# Patient Record
Sex: Female | Born: 1945 | Race: White | Hispanic: No | Smoking: Former smoker
Health system: Southern US, Community
[De-identification: ages and names within clinical notes are randomized; demographics above are authoritative.]

## PROBLEM LIST (undated history)

## (undated) ENCOUNTER — Ambulatory Visit: Admission: EM | Source: Home / Self Care

## (undated) DIAGNOSIS — M1990 Unspecified osteoarthritis, unspecified site: Secondary | ICD-10-CM

## (undated) DIAGNOSIS — L0390 Cellulitis, unspecified: Secondary | ICD-10-CM

## (undated) DIAGNOSIS — R7301 Impaired fasting glucose: Secondary | ICD-10-CM

## (undated) DIAGNOSIS — D131 Benign neoplasm of stomach: Secondary | ICD-10-CM

## (undated) DIAGNOSIS — K5792 Diverticulitis of intestine, part unspecified, without perforation or abscess without bleeding: Secondary | ICD-10-CM

## (undated) DIAGNOSIS — J189 Pneumonia, unspecified organism: Secondary | ICD-10-CM

## (undated) DIAGNOSIS — K5790 Diverticulosis of intestine, part unspecified, without perforation or abscess without bleeding: Secondary | ICD-10-CM

## (undated) DIAGNOSIS — K589 Irritable bowel syndrome without diarrhea: Secondary | ICD-10-CM

## (undated) DIAGNOSIS — H269 Unspecified cataract: Secondary | ICD-10-CM

## (undated) DIAGNOSIS — L02214 Cutaneous abscess of groin: Secondary | ICD-10-CM

## (undated) DIAGNOSIS — B029 Zoster without complications: Secondary | ICD-10-CM

## (undated) DIAGNOSIS — K219 Gastro-esophageal reflux disease without esophagitis: Secondary | ICD-10-CM

## (undated) DIAGNOSIS — T7840XA Allergy, unspecified, initial encounter: Secondary | ICD-10-CM

## (undated) DIAGNOSIS — M8580 Other specified disorders of bone density and structure, unspecified site: Secondary | ICD-10-CM

## (undated) DIAGNOSIS — I10 Essential (primary) hypertension: Secondary | ICD-10-CM

## (undated) DIAGNOSIS — M797 Fibromyalgia: Secondary | ICD-10-CM

## (undated) HISTORY — DX: Irritable bowel syndrome, unspecified: K58.9

## (undated) HISTORY — DX: Unspecified cataract: H26.9

## (undated) HISTORY — DX: Essential (primary) hypertension: I10

## (undated) HISTORY — PX: COLONOSCOPY W/ POLYPECTOMY: SHX1380

## (undated) HISTORY — DX: Gastro-esophageal reflux disease without esophagitis: K21.9

## (undated) HISTORY — PX: SPINE SURGERY: SHX786

## (undated) HISTORY — DX: Benign neoplasm of stomach: D13.1

## (undated) HISTORY — DX: Pneumonia, unspecified organism: J18.9

## (undated) HISTORY — DX: Diverticulosis of intestine, part unspecified, without perforation or abscess without bleeding: K57.90

## (undated) HISTORY — DX: Impaired fasting glucose: R73.01

## (undated) HISTORY — DX: Unspecified osteoarthritis, unspecified site: M19.90

## (undated) HISTORY — DX: Zoster without complications: B02.9

## (undated) HISTORY — PX: ESOPHAGOGASTRODUODENOSCOPY: SHX1529

## (undated) HISTORY — DX: Diverticulitis of intestine, part unspecified, without perforation or abscess without bleeding: K57.92

## (undated) HISTORY — DX: Fibromyalgia: M79.7

## (undated) HISTORY — DX: Allergy, unspecified, initial encounter: T78.40XA

## (undated) HISTORY — DX: Other specified disorders of bone density and structure, unspecified site: M85.80

## (undated) HISTORY — PX: DILATION AND CURETTAGE OF UTERUS: SHX78

---
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
## 2004-04-17 ENCOUNTER — Observation Stay (HOSPITAL_COMMUNITY): Admission: EM | Admit: 2004-04-17 | Discharge: 2004-04-18 | Payer: Self-pay | Admitting: Internal Medicine

## 2004-08-16 ENCOUNTER — Ambulatory Visit: Payer: Self-pay | Admitting: Internal Medicine

## 2004-08-18 ENCOUNTER — Ambulatory Visit (HOSPITAL_COMMUNITY): Admission: RE | Admit: 2004-08-18 | Discharge: 2004-08-18 | Payer: Self-pay | Admitting: Internal Medicine

## 2004-09-12 ENCOUNTER — Ambulatory Visit: Payer: Self-pay | Admitting: Internal Medicine

## 2004-11-09 ENCOUNTER — Ambulatory Visit: Payer: Self-pay | Admitting: Family Medicine

## 2005-03-20 ENCOUNTER — Ambulatory Visit: Payer: Self-pay | Admitting: Internal Medicine

## 2005-05-21 ENCOUNTER — Ambulatory Visit: Payer: Self-pay | Admitting: Family Medicine

## 2005-06-04 ENCOUNTER — Ambulatory Visit: Payer: Self-pay | Admitting: Internal Medicine

## 2005-06-06 ENCOUNTER — Ambulatory Visit: Payer: Self-pay | Admitting: Internal Medicine

## 2005-06-15 ENCOUNTER — Ambulatory Visit: Payer: Self-pay | Admitting: Internal Medicine

## 2005-06-19 ENCOUNTER — Ambulatory Visit: Payer: Self-pay | Admitting: Cardiovascular Disease

## 2005-08-10 ENCOUNTER — Ambulatory Visit: Payer: Self-pay | Admitting: Internal Medicine

## 2005-12-19 ENCOUNTER — Ambulatory Visit: Payer: Self-pay | Admitting: Gastroenterology

## 2005-12-20 ENCOUNTER — Ambulatory Visit: Payer: Self-pay | Admitting: Family Medicine

## 2005-12-20 ENCOUNTER — Other Ambulatory Visit: Admission: RE | Admit: 2005-12-20 | Discharge: 2005-12-20 | Payer: Self-pay | Admitting: Family Medicine

## 2005-12-20 ENCOUNTER — Encounter: Payer: Self-pay | Admitting: Family Medicine

## 2005-12-20 ENCOUNTER — Ambulatory Visit: Payer: Self-pay | Admitting: Gastroenterology

## 2006-02-07 ENCOUNTER — Ambulatory Visit: Payer: Self-pay | Admitting: Gastroenterology

## 2006-02-07 LAB — HM COLONOSCOPY

## 2006-03-04 ENCOUNTER — Ambulatory Visit: Payer: Self-pay | Admitting: Internal Medicine

## 2006-03-07 ENCOUNTER — Ambulatory Visit: Payer: Self-pay | Admitting: Internal Medicine

## 2006-08-20 ENCOUNTER — Ambulatory Visit: Payer: Self-pay | Admitting: Internal Medicine

## 2006-11-01 ENCOUNTER — Ambulatory Visit: Payer: Self-pay | Admitting: Family Medicine

## 2006-11-01 LAB — CONVERTED CEMR LAB
Eosinophils Absolute: 0.1 10*3/uL (ref 0.0–0.7)
Eosinophils Relative: 2 % (ref 0–5)
HCT: 40.9 % (ref 36.0–46.0)
Hemoglobin: 13.2 g/dL (ref 12.0–15.0)
Lymphocytes Relative: 23 % (ref 12–46)
Lymphs Abs: 1.1 10*3/uL (ref 0.7–3.3)
MCHC: 32.3 g/dL (ref 30.0–36.0)
MCV: 90.1 fL (ref 78.0–100.0)
Neutro Abs: 3.3 10*3/uL (ref 1.7–7.7)
Neutrophils Relative %: 67 % (ref 43–77)
RBC: 4.54 M/uL (ref 3.87–5.11)
RDW: 14.1 % — ABNORMAL HIGH (ref 11.5–14.0)
TSH: 2.565 microintl units/mL (ref 0.350–5.50)
WBC: 4.9 10*3/uL (ref 4.0–10.5)

## 2007-01-25 ENCOUNTER — Ambulatory Visit: Payer: Self-pay | Admitting: Family Medicine

## 2007-02-06 DIAGNOSIS — M810 Age-related osteoporosis without current pathological fracture: Secondary | ICD-10-CM | POA: Insufficient documentation

## 2007-02-06 DIAGNOSIS — H401290 Low-tension glaucoma, unspecified eye, stage unspecified: Secondary | ICD-10-CM | POA: Insufficient documentation

## 2007-04-01 ENCOUNTER — Telehealth (INDEPENDENT_AMBULATORY_CARE_PROVIDER_SITE_OTHER): Payer: Self-pay | Admitting: *Deleted

## 2007-04-02 ENCOUNTER — Ambulatory Visit: Payer: Self-pay | Admitting: Internal Medicine

## 2007-04-02 DIAGNOSIS — R109 Unspecified abdominal pain: Secondary | ICD-10-CM | POA: Insufficient documentation

## 2007-04-02 DIAGNOSIS — R7989 Other specified abnormal findings of blood chemistry: Secondary | ICD-10-CM | POA: Insufficient documentation

## 2007-04-02 DIAGNOSIS — IMO0002 Reserved for concepts with insufficient information to code with codable children: Secondary | ICD-10-CM | POA: Insufficient documentation

## 2007-04-02 DIAGNOSIS — M79609 Pain in unspecified limb: Secondary | ICD-10-CM | POA: Insufficient documentation

## 2007-04-02 DIAGNOSIS — E559 Vitamin D deficiency, unspecified: Secondary | ICD-10-CM

## 2007-04-02 LAB — CONVERTED CEMR LAB
Ketones, urine, test strip: NEGATIVE
Nitrite: NEGATIVE
Protein, U semiquant: NEGATIVE

## 2007-04-03 ENCOUNTER — Encounter: Payer: Self-pay | Admitting: Family Medicine

## 2007-04-04 ENCOUNTER — Encounter (INDEPENDENT_AMBULATORY_CARE_PROVIDER_SITE_OTHER): Payer: Self-pay | Admitting: *Deleted

## 2007-04-04 LAB — CONVERTED CEMR LAB
Basophils Relative: 0.5 % (ref 0.0–1.0)
Eosinophils Absolute: 0.1 10*3/uL (ref 0.0–0.6)
Eosinophils Relative: 1.5 % (ref 0.0–5.0)
HCT: 38.8 % (ref 36.0–46.0)
Hemoglobin: 13.6 g/dL (ref 12.0–15.0)
Hgb A1c MFr Bld: 5.1 % (ref 4.6–6.0)
MCV: 88.6 fL (ref 78.0–100.0)
Monocytes Relative: 7.8 % (ref 3.0–11.0)
Neutro Abs: 2.7 10*3/uL (ref 1.4–7.7)
Platelets: 145 10*3/uL — ABNORMAL LOW (ref 150–400)
RDW: 12.8 % (ref 11.5–14.6)
Rhuematoid fact SerPl-aCnc: <20 intl units/mL — ABNORMAL LOW (ref 0.0–20.0)
Sed Rate: 17 mm/hr (ref 0–25)
WBC: 3.9 10*3/uL — ABNORMAL LOW (ref 4.5–10.5)

## 2007-04-08 ENCOUNTER — Telehealth (INDEPENDENT_AMBULATORY_CARE_PROVIDER_SITE_OTHER): Payer: Self-pay | Admitting: *Deleted

## 2007-05-21 ENCOUNTER — Ambulatory Visit: Payer: Self-pay | Admitting: Internal Medicine

## 2007-05-22 ENCOUNTER — Encounter (INDEPENDENT_AMBULATORY_CARE_PROVIDER_SITE_OTHER): Payer: Self-pay | Admitting: *Deleted

## 2007-07-16 ENCOUNTER — Other Ambulatory Visit: Admission: RE | Admit: 2007-07-16 | Discharge: 2007-07-16 | Payer: Self-pay | Admitting: Obstetrics & Gynecology

## 2007-07-18 ENCOUNTER — Encounter: Payer: Self-pay | Admitting: Internal Medicine

## 2007-12-09 ENCOUNTER — Telehealth (INDEPENDENT_AMBULATORY_CARE_PROVIDER_SITE_OTHER): Payer: Self-pay | Admitting: *Deleted

## 2007-12-17 ENCOUNTER — Ambulatory Visit: Payer: Self-pay | Admitting: Internal Medicine

## 2008-01-20 ENCOUNTER — Telehealth: Payer: Self-pay | Admitting: Internal Medicine

## 2008-01-21 ENCOUNTER — Telehealth (INDEPENDENT_AMBULATORY_CARE_PROVIDER_SITE_OTHER): Payer: Self-pay

## 2008-01-21 ENCOUNTER — Ambulatory Visit: Payer: Self-pay | Admitting: Cardiology

## 2008-01-21 ENCOUNTER — Ambulatory Visit: Payer: Self-pay | Admitting: Internal Medicine

## 2008-01-21 DIAGNOSIS — K589 Irritable bowel syndrome without diarrhea: Secondary | ICD-10-CM

## 2008-01-21 DIAGNOSIS — K219 Gastro-esophageal reflux disease without esophagitis: Secondary | ICD-10-CM

## 2008-01-21 DIAGNOSIS — J45909 Unspecified asthma, uncomplicated: Secondary | ICD-10-CM

## 2008-01-21 DIAGNOSIS — IMO0001 Reserved for inherently not codable concepts without codable children: Secondary | ICD-10-CM

## 2008-01-21 LAB — CONVERTED CEMR LAB
BUN: 7 mg/dL (ref 6–23)
Basophils Relative: 0 % (ref 0.0–1.0)
CO2: 31 meq/L (ref 19–32)
Calcium: 9.5 mg/dL (ref 8.4–10.5)
Creatinine, Ser: 0.7 mg/dL (ref 0.4–1.2)
Lymphocytes Relative: 8.8 % — ABNORMAL LOW (ref 12.0–46.0)
MCHC: 34.2 g/dL (ref 30.0–36.0)
MCV: 89.7 fL (ref 78.0–100.0)
Platelets: 159 10*3/uL (ref 150–400)
RBC: 4.62 M/uL (ref 3.87–5.11)
RDW: 13.2 % (ref 11.5–14.6)
Sodium: 141 meq/L (ref 135–145)
WBC: 9.4 10*3/uL (ref 4.5–10.5)

## 2008-01-22 ENCOUNTER — Telehealth: Payer: Self-pay | Admitting: Internal Medicine

## 2008-03-29 ENCOUNTER — Telehealth (INDEPENDENT_AMBULATORY_CARE_PROVIDER_SITE_OTHER): Payer: Self-pay | Admitting: *Deleted

## 2008-06-11 ENCOUNTER — Telehealth: Payer: Self-pay | Admitting: Internal Medicine

## 2008-06-14 ENCOUNTER — Ambulatory Visit: Payer: Self-pay | Admitting: Internal Medicine

## 2008-06-15 LAB — CONVERTED CEMR LAB
AST: 27 units/L (ref 0–37)
Alkaline Phosphatase: 84 units/L (ref 39–117)
Basophils Relative: 0 % (ref 0.0–3.0)
Bilirubin, Direct: 0.1 mg/dL (ref 0.0–0.3)
Eosinophils Absolute: 0.1 10*3/uL (ref 0.0–0.7)
HCT: 39.3 % (ref 36.0–46.0)
Lipase: 23 units/L (ref 11.0–59.0)
Monocytes Absolute: 0.4 10*3/uL (ref 0.1–1.0)
Monocytes Relative: 9.1 % (ref 3.0–12.0)
Neutro Abs: 2.9 10*3/uL (ref 1.4–7.7)
Neutrophils Relative %: 71.3 % (ref 43.0–77.0)
Platelets: 144 10*3/uL — ABNORMAL LOW (ref 150–400)
RDW: 12.6 % (ref 11.5–14.6)
Total Bilirubin: 0.7 mg/dL (ref 0.3–1.2)
WBC: 4.2 10*3/uL — ABNORMAL LOW (ref 4.5–10.5)

## 2008-06-16 ENCOUNTER — Encounter (INDEPENDENT_AMBULATORY_CARE_PROVIDER_SITE_OTHER): Payer: Self-pay | Admitting: *Deleted

## 2008-07-05 ENCOUNTER — Telehealth (INDEPENDENT_AMBULATORY_CARE_PROVIDER_SITE_OTHER): Payer: Self-pay | Admitting: *Deleted

## 2008-07-19 ENCOUNTER — Encounter: Payer: Self-pay | Admitting: Internal Medicine

## 2008-08-16 ENCOUNTER — Encounter: Payer: Self-pay | Admitting: Internal Medicine

## 2008-08-18 ENCOUNTER — Ambulatory Visit: Payer: Self-pay | Admitting: Internal Medicine

## 2008-08-18 ENCOUNTER — Encounter (INDEPENDENT_AMBULATORY_CARE_PROVIDER_SITE_OTHER): Payer: Self-pay | Admitting: *Deleted

## 2008-08-18 LAB — CONVERTED CEMR LAB
OCCULT 1: NEGATIVE
OCCULT 3: NEGATIVE

## 2008-08-31 ENCOUNTER — Telehealth: Payer: Self-pay | Admitting: Internal Medicine

## 2008-09-02 ENCOUNTER — Telehealth: Payer: Self-pay | Admitting: Internal Medicine

## 2008-09-25 ENCOUNTER — Ambulatory Visit: Payer: Self-pay | Admitting: *Deleted

## 2008-09-25 DIAGNOSIS — J40 Bronchitis, not specified as acute or chronic: Secondary | ICD-10-CM | POA: Insufficient documentation

## 2008-09-27 ENCOUNTER — Telehealth (INDEPENDENT_AMBULATORY_CARE_PROVIDER_SITE_OTHER): Payer: Self-pay | Admitting: *Deleted

## 2008-10-07 ENCOUNTER — Ambulatory Visit: Payer: Self-pay | Admitting: Family Medicine

## 2008-10-21 ENCOUNTER — Ambulatory Visit: Payer: Self-pay | Admitting: Internal Medicine

## 2008-10-21 ENCOUNTER — Telehealth (INDEPENDENT_AMBULATORY_CARE_PROVIDER_SITE_OTHER): Payer: Self-pay | Admitting: *Deleted

## 2008-11-08 ENCOUNTER — Ambulatory Visit: Payer: Self-pay | Admitting: Internal Medicine

## 2008-11-08 DIAGNOSIS — R1013 Epigastric pain: Secondary | ICD-10-CM

## 2008-11-08 DIAGNOSIS — Z860100 Personal history of colon polyps, unspecified: Secondary | ICD-10-CM | POA: Insufficient documentation

## 2008-11-09 ENCOUNTER — Ambulatory Visit: Payer: Self-pay | Admitting: Internal Medicine

## 2008-11-09 ENCOUNTER — Encounter: Payer: Self-pay | Admitting: Internal Medicine

## 2008-12-06 ENCOUNTER — Telehealth (INDEPENDENT_AMBULATORY_CARE_PROVIDER_SITE_OTHER): Payer: Self-pay | Admitting: *Deleted

## 2009-02-16 ENCOUNTER — Telehealth (INDEPENDENT_AMBULATORY_CARE_PROVIDER_SITE_OTHER): Payer: Self-pay | Admitting: *Deleted

## 2009-03-31 ENCOUNTER — Telehealth (INDEPENDENT_AMBULATORY_CARE_PROVIDER_SITE_OTHER): Payer: Self-pay | Admitting: *Deleted

## 2009-05-06 ENCOUNTER — Ambulatory Visit: Payer: Self-pay | Admitting: Internal Medicine

## 2009-07-06 ENCOUNTER — Telehealth (INDEPENDENT_AMBULATORY_CARE_PROVIDER_SITE_OTHER): Payer: Self-pay | Admitting: *Deleted

## 2009-07-26 ENCOUNTER — Telehealth: Payer: Self-pay | Admitting: Internal Medicine

## 2009-08-01 ENCOUNTER — Telehealth: Payer: Self-pay | Admitting: Internal Medicine

## 2009-10-04 ENCOUNTER — Ambulatory Visit: Payer: Self-pay | Admitting: Family

## 2009-10-04 DIAGNOSIS — R29818 Other symptoms and signs involving the nervous system: Secondary | ICD-10-CM | POA: Insufficient documentation

## 2009-12-15 ENCOUNTER — Encounter: Payer: Self-pay | Admitting: Cardiology

## 2009-12-15 ENCOUNTER — Ambulatory Visit: Payer: Self-pay | Admitting: Internal Medicine

## 2009-12-15 ENCOUNTER — Observation Stay (HOSPITAL_COMMUNITY): Admission: EM | Admit: 2009-12-15 | Discharge: 2009-12-16 | Payer: Self-pay | Admitting: Emergency Medicine

## 2009-12-15 ENCOUNTER — Ambulatory Visit: Payer: Self-pay | Admitting: Cardiovascular Disease

## 2009-12-15 DIAGNOSIS — R030 Elevated blood-pressure reading, without diagnosis of hypertension: Secondary | ICD-10-CM

## 2009-12-15 DIAGNOSIS — R079 Chest pain, unspecified: Secondary | ICD-10-CM | POA: Insufficient documentation

## 2009-12-15 DIAGNOSIS — R6889 Other general symptoms and signs: Secondary | ICD-10-CM

## 2009-12-15 LAB — CONVERTED CEMR LAB
Basophils Absolute: 0 10*3/uL (ref 0.0–0.1)
Basophils Relative: 0.3 % (ref 0.0–3.0)
CK-MB: 8.2 ng/mL — ABNORMAL HIGH (ref 0.3–4.0)
Eosinophils Relative: 0.5 % (ref 0.0–5.0)
MCHC: 34.1 g/dL (ref 30.0–36.0)
Monocytes Relative: 7.2 % (ref 3.0–12.0)
Neutro Abs: 3.9 10*3/uL (ref 1.4–7.7)
RBC: 4.56 M/uL (ref 3.87–5.11)
Total CK: 265 units/L — ABNORMAL HIGH (ref 7–177)
Troponin I: <0.01 ng/mL (ref <0.06)

## 2009-12-16 ENCOUNTER — Encounter: Payer: Self-pay | Admitting: Cardiology

## 2009-12-16 ENCOUNTER — Telehealth: Payer: Self-pay | Admitting: Internal Medicine

## 2009-12-19 ENCOUNTER — Telehealth (INDEPENDENT_AMBULATORY_CARE_PROVIDER_SITE_OTHER): Payer: Self-pay | Admitting: *Deleted

## 2009-12-20 ENCOUNTER — Ambulatory Visit: Payer: Self-pay | Admitting: Internal Medicine

## 2009-12-20 ENCOUNTER — Telehealth: Payer: Self-pay | Admitting: Internal Medicine

## 2009-12-20 DIAGNOSIS — R946 Abnormal results of thyroid function studies: Secondary | ICD-10-CM

## 2010-01-17 ENCOUNTER — Encounter: Payer: Self-pay | Admitting: Internal Medicine

## 2010-02-08 ENCOUNTER — Telehealth (INDEPENDENT_AMBULATORY_CARE_PROVIDER_SITE_OTHER): Payer: Self-pay | Admitting: *Deleted

## 2010-04-26 ENCOUNTER — Telehealth: Payer: Self-pay | Admitting: Internal Medicine

## 2010-05-10 ENCOUNTER — Telehealth (INDEPENDENT_AMBULATORY_CARE_PROVIDER_SITE_OTHER): Payer: Self-pay | Admitting: *Deleted

## 2010-05-11 ENCOUNTER — Ambulatory Visit: Payer: Self-pay | Admitting: Internal Medicine

## 2010-05-29 ENCOUNTER — Telehealth: Payer: Self-pay | Admitting: Internal Medicine

## 2010-06-09 ENCOUNTER — Ambulatory Visit: Payer: Self-pay | Admitting: Internal Medicine

## 2010-06-19 ENCOUNTER — Ambulatory Visit: Payer: Self-pay | Admitting: Internal Medicine

## 2010-07-03 ENCOUNTER — Telehealth: Payer: Self-pay | Admitting: Internal Medicine

## 2010-07-03 ENCOUNTER — Ambulatory Visit: Payer: Self-pay | Admitting: Internal Medicine

## 2010-07-03 DIAGNOSIS — R350 Frequency of micturition: Secondary | ICD-10-CM

## 2010-07-03 LAB — CONVERTED CEMR LAB
Bilirubin Urine: NEGATIVE
Glucose, Urine, Semiquant: NEGATIVE
Ketones, urine, test strip: NEGATIVE
Protein, U semiquant: NEGATIVE
Specific Gravity, Urine: <1.005
Urobilinogen, UA: 0.2
pH: 6

## 2010-07-04 ENCOUNTER — Encounter: Payer: Self-pay | Admitting: Internal Medicine

## 2010-07-05 ENCOUNTER — Telehealth: Payer: Self-pay | Admitting: Internal Medicine

## 2010-07-06 ENCOUNTER — Telehealth (INDEPENDENT_AMBULATORY_CARE_PROVIDER_SITE_OTHER): Payer: Self-pay | Admitting: *Deleted

## 2010-07-10 ENCOUNTER — Telehealth: Payer: Self-pay | Admitting: Internal Medicine

## 2010-07-19 ENCOUNTER — Ambulatory Visit: Payer: Self-pay | Admitting: Internal Medicine

## 2010-07-19 ENCOUNTER — Telehealth: Payer: Self-pay | Admitting: Internal Medicine

## 2010-07-19 DIAGNOSIS — K5289 Other specified noninfective gastroenteritis and colitis: Secondary | ICD-10-CM

## 2010-07-19 DIAGNOSIS — R109 Unspecified abdominal pain: Secondary | ICD-10-CM

## 2010-07-19 LAB — CONVERTED CEMR LAB
BUN: 9 mg/dL (ref 6–23)
Bilirubin Urine: NEGATIVE
Eosinophils Absolute: 0 10*3/uL (ref 0.0–0.7)
Hemoglobin, Urine: NEGATIVE
Ketones, ur: NEGATIVE mg/dL
Lymphs Abs: 0.6 10*3/uL — ABNORMAL LOW (ref 0.7–4.0)
Monocytes Relative: 8.4 % (ref 3.0–12.0)
RDW: 13.3 % (ref 11.5–14.6)
Specific Gravity, Urine: <=1.005 (ref 1.000–1.030)
Total Protein, Urine: NEGATIVE mg/dL
Urobilinogen, UA: 0.2 (ref 0.0–1.0)

## 2010-07-24 ENCOUNTER — Ambulatory Visit: Payer: Self-pay | Admitting: Internal Medicine

## 2010-09-14 ENCOUNTER — Telehealth: Payer: Self-pay | Admitting: Internal Medicine

## 2010-09-15 ENCOUNTER — Telehealth: Payer: Self-pay | Admitting: Internal Medicine

## 2010-09-22 ENCOUNTER — Telehealth: Payer: Self-pay | Admitting: Internal Medicine

## 2010-10-12 NOTE — Progress Notes (Signed)
Summary: Abd pain and diet   Phone Note Call from Patient Call back at Home Phone [phone]   Caller: Patient Summary of Call: Patient called this morning complaining of lower abd pain. Patient was here last week for a UTI and was prescribed Cipro 500mg  twice a day. She has been taking this as prescribed. She says that her tongue is sore and more red then usual. No swelling or rash. She also has the abd pain which is located between her pubic line and belly button. She has no diahrea, but does have increased gas and pain. She says it hurts to sit and walk. She has been taking tylenol for a few days and he temp right now is 99.2. She says when she sits to pee she has a small BM everytime. She says it also hurts to cough. She has not taken her CIpro this morning and will wait to hear from [place] before she does take it. She says she has also been using the medicine for the spams. Please advise.  Initial call taken by: Harold Barban,  July 10, 2010 11:03 AM  Follow-up for Phone Call         Hold Cipro & clear liquids X 24 hrs. CBC & dif , CCU & OV INB. To ER if symptoms progress. Follow-up by: Marga Melnick MD,  July 10, 2010 1:08 PM  Additional Follow-up for Phone Call Additional follow up Details #1::        Patient is aware and understands all the instructions. Will call tomorrow to let us know how she is doing.  Additional Follow-up by: Harold Barban,  July 10, 2010 1:18 PM    Additional Follow-up for Phone Call Additional follow up Details #2::    Per dr hopper pt needs to also take align daily which is a probiotic. Discuss with patient will take a probiotic she has around the house and will pick up the align on tomorrow.............Marland KitchenFelecia Deloach CMA  July 10, 2010 4:56 PM   Patient left message on triage that she feels much better today, still has some abd pain, but is still much better. Patient will continue probiotic and clear liquid diet. She would like to know when she  should advance her diet? Please advise. Lucious Groves CMA  July 11, 2010 9:42 AM   Additional Follow-up for Phone Call Additional follow up Details #3:: Details for Additional Follow-up Action Taken: Per MD the patient can advance her diet once asymptomatic for 24 hrs.  Left message on voicemail to call back to office. Lucious Groves CMA  July 11, 2010 3:16 PM    Patient notified. Lucious Groves CMA  July 12, 2010 8:49 AM

## 2010-10-12 NOTE — Assessment & Plan Note (Signed)
Summary: REOCCURING GI EPISODE/KB   Vital Signs:  Patient profile:   65 year old female Weight:      153.2 pounds BMI:     26.81 Temp:     99.6 degrees F oral Pulse rate:   72 / minute Resp:     16 per minute BP sitting:   120 / 78  (left arm) Cuff size:   large  Vitals Entered By: Shonna Chock CMA (July 19, 2010 4:41 PM) CC: Follow-up visit: on labs and xray , Abdominal pain   Primary Care Provider:  Marga Melnick MD  CC:  Follow-up visit: on labs and xray  and Abdominal pain.  History of Present Illness: Abdominal Pain      This is a 65 year old woman who presents with Abdominal pain since taking Cipro for UTI (see prior record).  SHe had improved with clear liquids @ that time. The patient reports soe  nausea and anorexia, but denies vomiting, diarrhea, constipation, melena, hematochezia, and hematemesis.  The location of the pain is suprapubic and with some  diffuse radiation.  The pain is described as constant, dull to  cramping in quality.  Associated symptoms include  low grade fever.  The patient denies the following symptoms: weight loss, dysuria, chest pain, jaundice, dark urine, and vaginal bleeding.  The pain is worse with fluids ; there is some improvement with voiding & BM  The pain is better with antispasmodics, Hyocyamine 2 every 4 hrs .   PMH of Diverticulitis, Ileitis, IBS , & Colitis . She has seen Dr Leone Payor since Dr Victorino Dike retired . Acute abdominal series & labs reviewed.UA essentially negative ; WBC normal  but 82% Neutrophils. BUN & creat WNL.Appt with Dr Leone Payor 11/14 scheduled.  Allergies: 1)  ! Sulfa 2)  ! Ceclor 3)  ! Asa 4)  Relafen 5)  Premarin 6)  Prednisone 7)  * Solumedrol 8)  Levaquin 9)  * Belladonna 10)  * Gabapentin 11)  * Psyllium  Past History:  Past Medical History: Osteopenia 2005 chest pain,Dx :costochondritis based on Stress Test Asthma Diverticulosis; Diverticulitis 5/09, Dr Leone Payor Fibromyalgia GERD; gastric polyps  X3 Glaucoma Irritable Bowel Syndrome,Colitis & Ileitis , PMH of , Dr Leone Payor Pneumonia  Urinary Tract Infection Remote  PMH of colon polyps 1998, all subsequent  colonoscopies negative  Diverticulitis, hx of 2009  Past Surgical History: T9-L5 fusions Colon polypectomy1998; Endoscopy  : gastric polyps, last 2007  Physical Exam  General:  well-nourished,in no acute distress; alert,appropriate and cooperative throughout examination Eyes:  No corneal or conjunctival inflammation noted.No icterus Mouth:  Oral mucosa and oropharynx without lesions or exudates.  Teeth in good repair. Tongue not dry Lungs:  Normal respiratory effort, chest expands symmetrically. Lungs are clear to auscultation, no crackles or wheezes. Heart:  Normal rate and regular rhythm. S1 and S2 normal without gallop, murmur, click, rub or other extra sounds. Abdomen:  Bowel sounds positive,abdomen soft  but slightly tender  in suprapubic area without masses, organomegaly or hernias noted. Skin:  Intact without suspicious lesions or rashes. No jaundice or tenting Cervical Nodes:  No lymphadenopathy noted Axillary Nodes:  No palpable lymphadenopathy Psych:  memory intact for recent and remote, normally interactive, and good eye contact.     Impression & Recommendations:  Problem # 1:  ABDOMINAL PAIN, SUPRAPUBIC (ICD-789.09) increased Neutrophils Her updated medication list for this problem includes:    Tylenol Ex St Arthritis Pain 500 Mg Tabs (Acetaminophen) .Marland Kitchen... Take 2 tab  once daily as needed  Problem # 2:  FEVER (ICD-780.60)  Problem # 3:  DIVERTICULITIS, HX OF (ICD-V12.79)  Problem # 4:  COLITIS, HX OF (ICD-V12.79)  Problem # 5:  ILEITIS (ICD-558.9) PMH of  Problem # 6:  IRRITABLE BOWEL SYNDROME (ICD-564.1)  Complete Medication List: 1)  Xalatan 0.005 % Soln (Latanoprost) .... Insert 1 drop into each eye nightly 2)  Tylenol Ex St Arthritis Pain 500 Mg Tabs (Acetaminophen) .... Take 2 tab once daily as  needed 3)  Vitamin D 1000 Unit Tabs (Cholecalciferol) .Marland Kitchen.. 1 by mouth once daily 4)  Proair Hfa 108 (90 Base) Mcg/act Aers (Albuterol sulfate) .Marland Kitchen.. 1-2 puffs every 4 hours as needed shortness of breath 5)  Singulair 10 Mg Tabs (Montelukast sodium) .Marland Kitchen.. 1 by mouth once daily 6)  Magnesium Citrate 400 Mg Capsule  .... Take 1 tablet by mouth once a day 7)  Hyoscyamine Sulfate 0.125 Mg Subl (Hyoscyamine sulfate) .Marland Kitchen.. 1-2 by mouth q 4-6 hours as needed abdominal pain 8)  Allegra 180 Mg Tabs (Fexofenadine hcl) .... Otc 1 by mouth once daily (seasonal) 9)  Prilosec Otc 20 Mg Tbec (Omeprazole magnesium) .Marland Kitchen.. 1 by mouth once daily 10)  Ester-c Tabs (Bioflavonoid products) .... 500mg  1 by mouth once daily 11)  Dulera 200-5 Mcg/act Aero (Mometasone furo-formoterol fum) .Marland Kitchen.. 1-2 puffs two times a day ; gargle & spit after use 12)  Hyoscyamine Sulfate 0.125 Mg Subl (Hyoscyamine sulfate) .Marland Kitchen.. 1 under tongue every 6 hrs as needed for abdominal symptoms 13)  Nystatin-triamcinolone 100000-0.1 Unit/gm-% Crea (Nystatin-triamcinolone) .... Apply to affected area as needed 14)  Ciprofloxacin Hcl 500 Mg Tabs (Ciprofloxacin hcl) .Marland Kitchen.. 1 by mouth two times a day 15)  Metronidazole 250 Mg Tabs (Metronidazole) .Marland Kitchen.. 1 three times a day  Patient Instructions: 1)  Keep appt 11/14 with Dr Leone Payor.Stick with a low residue diet and avoid foods that can irritate your bowels. To ER if Warning Signs appear as discussed with this record. Prescriptions: METRONIDAZOLE 250 MG TABS (METRONIDAZOLE) 1 three times a day  #21 x 0   Entered and Authorized by:   Marga Melnick MD   Signed by:   Marga Melnick MD on 07/19/2010   Method used:   Faxed to ...       [facility]  [address] [phone]* (retail)       [address]       [place], [place]  [address]       Ph: [phone]       Fax: [phone]   RxID:   [id number]    Orders Added: 1)  Est. Patient Level IV [44034]

## 2010-10-12 NOTE — Progress Notes (Signed)
Summary: fyi Samples  Phone Note Call from Patient Call back at Home Phone [phone]   Caller: Patient Summary of Call: pt called left msg, says was told by Dr Alwyn Ren  if samples available would give.  Need Singulair,Symbicort, and ProAir has expiration of May.  --Pt says any or all, also wants a generic Flonase, smelling leaves burning or anything like that triggers sneezing .Kandice Hams  February 08, 2010 2:28 PM  Initial call taken by: Kandice Hams,  February 08, 2010 2:26 PM  Follow-up for Phone Call        no generic Flonase available. One  month each of others OK. Samples are erratic ; unfortunately can't count on availability. Meds with May date good for several months Follow-up by: Marga Melnick MD,  February 08, 2010 4:47 PM  Additional Follow-up for Phone Call Additional follow up Details #1::        Patient is aware of Dr.Hopper's response. Samples placed at the front for pick-up. Additional Follow-up by: Shonna Chock,  February 08, 2010 4:58 PM

## 2010-10-12 NOTE — Progress Notes (Signed)
Summary: FYI-med work good  Phone Note Call from Patient Call back at [employer] [phone]   Caller: Patient Summary of Call: FYI- patient wanted to let Dr. Alwyn Ren know that Elwin Sleight is working great  Initial call taken by: Doristine Devoid CMA,  May 29, 2010 2:44 PM  Follow-up for Phone Call        great ! Follow-up by: Marga Melnick MD,  May 29, 2010 3:12 PM

## 2010-10-12 NOTE — Progress Notes (Signed)
Summary: Reoccuring episode  Phone Note Call from Patient Call back at Home Phone [phone]   Summary of Call: Patient left message on triage that she did begin to increase her diet after episode last week and was doing good until last night. She started hurting really bad and took two of her hyoscyamine, she has been doing this q 4hrs, but the pain comes right back. She has returned to a clear liquid diet, reports a temp on 98.7 which she considers a slight fever for her. Please advise.  (no diarrhea, but frequent loose stools) Initial call taken by: Lucious Groves CMA,  July 19, 2010 10:37 AM  Follow-up for Phone Call        She needs CBC & dif , BUN, creat , CCU for UA, C&S (789.00)& abdominal films @ [facility] then OV here this afternoon Follow-up by: Marga Melnick MD,  July 19, 2010 11:09 AM  Additional Follow-up for Phone Call Additional follow up Details #1::        Patient notified of the above, she will call [facility] for appt. Additional Follow-up by: Lucious Groves CMA,  July 19, 2010 11:19 AM  New Problems: ABDOMINAL PAIN, UNSPECIFIED SITE (ICD-789.00)   New Problems: ABDOMINAL PAIN, UNSPECIFIED SITE (ICD-789.00)

## 2010-10-12 NOTE — Assessment & Plan Note (Signed)
Summary: p[   Vital Signs:  Patient profile:   65 year old female Weight:      153.2 pounds BMI:     26.81 Temp:     99.3 degrees F oral Pulse rate:   60 / minute Resp:     16 per minute BP sitting:   122 / 78  (left arm) Cuff size:   regular  Vitals Entered By: Shonna Chock CMA (May 11, 2010 12:28 PM) CC: 1.) Hoarseness and cough x several weeks  2.) Discuss Symbicort, Cough   Primary Care Provider:  Marga Melnick MD  CC:  1.) Hoarseness and cough x several weeks  2.) Discuss Symbicort and Cough.  History of Present Illness: Cough      This is a 65 year old woman who presents with Cough especially in am for > 1 month.  The patient reports non-productive cough except for some plugs , mild   wheezing, hoarseness and exertional dyspnea, but denies shortness of breath @rest  , fever( untilthis appt), and hemoptysis.  The patient denies the following symptoms: cold/URI symptoms and sore throat.  The cough is worse with lying down.  Ineffective prior treatments have included other asthma medication( Singulair & Symbicort). Allegra didn't help.  Risk factors include history of asthma and history of reflux.  Peak flows remain approx 400.  Allergies: 1)  ! Sulfa 2)  ! Ceclor 3)  ! Asa 4)  Relafen 5)  Premarin 6)  Prednisone 7)  * Solumedrol 8)  Levaquin 9)  * Belladonna 10)  * Gabapentin 11)  * Psyllium  Review of Systems Allergy:  Denies itching eyes and sneezing.  Physical Exam  General:  well-nourished,in no acute distress; alert,appropriate and cooperative throughout examination Ears:  External ear exam shows no significant lesions or deformities.  Otoscopic examination reveals clear canals, tympanic membranes are intact bilaterally without bulging, retraction, inflammation or discharge. Hearing is grossly normal bilaterally. Nose:  External nasal examination shows no deformity or inflammation. Nasal mucosa are pink and moist without lesions or exudates. Mouth:  Oral  mucosa and oropharynx without lesions or exudates.  Teeth in good repair. Lungs:  Normal respiratory effort, chest expands symmetrically. Lungs : low grade expiratory wheezes. Heart:  Normal rate and regular rhythm. S1 and S2 normal without gallop, murmur, click, rub .S4 Cervical Nodes:  No lymphadenopathy noted Axillary Nodes:  No palpable lymphadenopathy   Impression & Recommendations:  Problem # 1:  ASTHMA (ICD-493.90)  The following medications were removed from the medication list:    Symbicort 160-4.5 Mcg/act Aero (Budesonide-formoterol fumarate) .Marland Kitchen... 1-2 puffs every 12 hrs ; gargle after use & swallow Her updated medication list for this problem includes:    Proair Hfa 108 (90 Base) Mcg/act Aers (Albuterol sulfate) .Marland Kitchen... 1-2 puffs every 4 hours as needed shortness of breath    Singulair 10 Mg Tabs (Montelukast sodium) .Marland Kitchen... 1 by mouth once daily    Dulera 200-5 Mcg/act Aero (Mometasone furo-formoterol fum) .Marland Kitchen... 1-2 puffs two times a day ; gargle & spit after use  Complete Medication List: 1)  Xalatan 0.005 % Soln (Latanoprost) .... Insert 1 drop into each eye nightly 2)  Zegerid Otc 20-1100 Mg Caps (Omeprazole-sodium bicarbonate) .... Prn 3)  Tylenol Ex St Arthritis Pain 500 Mg Tabs (Acetaminophen) .... Take 2 tab once daily as needed 4)  Vitamin D 1000 Unit Tabs (Cholecalciferol) .Marland Kitchen.. 1 by mouth once daily 5)  Proair Hfa 108 (90 Base) Mcg/act Aers (Albuterol sulfate) .Marland Kitchen.. 1-2 puffs  every 4 hours as needed shortness of breath 6)  Singulair 10 Mg Tabs (Montelukast sodium) .Marland Kitchen.. 1 by mouth once daily 7)  Magnesium Citrate 400 Mg Capsule  .... Take 1 tablet by mouth once a day 8)  Calcium Citrate 990 Mg Tablet  .... Take 1 tablet by mouth once a day 9)  Hyoscyamine Sulfate 0.125 Mg Subl (Hyoscyamine sulfate) .Marland Kitchen.. 1-2 by mouth q 4-6 hours as needed abdominal pain 10)  Allegra 180 Mg Tabs (Fexofenadine hcl) .... Otc 1 by mouth once daily (seasonal) 11)  Ranitidine Hcl 150 Mg Tabs  (Ranitidine hcl) .Marland Kitchen.. 1 by mouth at bedtime as needed 12)  Ester-c Tabs (Bioflavonoid products) .... 500mg  1 by mouth once daily 13)  B Complex Tabs (B complex vitamins) .Marland Kitchen.. 1 by mouth once daily 14)  Dulera 200-5 Mcg/act Aero (Mometasone furo-formoterol fum) .Marland Kitchen.. 1-2 puffs two times a day ; gargle & spit after use  Patient Instructions: 1)  Neti pot once daily - two times a day as needed . 2)  Drink as much fluid as you can tolerate for the next few days. Prescriptions: DULERA 200-5 MCG/ACT AERO (MOMETASONE FURO-FORMOTEROL FUM) 1-2 puffs two times a day ; gargle & spit after use  #1 x 5   Entered and Authorized by:   Marga Melnick MD   Signed by:   Marga Melnick MD on 05/11/2010   Method used:   Print then Give to Patient   RxID:   [id number]

## 2010-10-12 NOTE — Assessment & Plan Note (Signed)
Summary: hosp followup/alr   Vital Signs:  Patient profile:   65 year old female Weight:      152 pounds O2 Sat:      100 % Pulse rate:   62 / minute Resp:     16 per minute BP sitting:   114 / 68  (left arm) Cuff size:   regular  Vitals Entered By: Shonna Chock (December 20, 2009 12:16 PM) CC: Hospital follow-up: Asthma not controlled Comments REVIEWED MED LIST, PATIENT AGREED DOSE AND INSTRUCTION CORRECT    Primary Care Provider:  Marga Melnick MD  CC:  Hospital follow-up: Asthma not controlled.  History of Present Illness: Major stressors due to  family issues.No chest pain since D/C , but she has residual weakness & SOB despite resumption of Symbicort & Singulair. Using rescue up to 4X/ day , but improved from 40% to 75-80 % today of baseline. TSH was 7.332.  Allergies: 1)  ! Sulfa 2)  ! Ceclor 3)  Relafen 4)  Premarin 5)  Prednisone 6)  * Solumedrol 7)  Levaquin 8)  * Belladonna 9)  * Gabapentin 10)  * Psyllium  Review of Systems General:  Denies chills, fever, and sweats. Resp:  Complains of cough and sputum productive; She describes plugs in am.  Physical Exam  General:  well-nourished,in no acute distress; alert,appropriate and cooperative throughout examination Neck:  No deformities, masses, or tenderness noted. Lungs:  Normal respiratory effort, chest expands symmetrically. Lungs are clear to auscultation, no crackles or wheezes. Heart:  Normal rate and regular rhythm. S1 and S2 normal without gallop, murmur, click, rub.S4 Neurologic:  alert & oriented X3 and DTRs symmetrical and normal.   Psych:  memory intact for recent and remote, flat affect, and subdued.     Impression & Recommendations:  Problem # 1:  CHEST PAIN (ICD-786.50) resolved  Problem # 2:  THYROID FUNCTION TEST, ABNORMAL (ICD-794.5)  Orders: Venipuncture (81191) TLB-TSH (Thyroid Stimulating Hormone) (84443-TSH) TLB-T4 (Thyrox), Free (84439-FT4R) TLB-T3, Free (Triiodothyronine)  (84481-T3FREE)  Problem # 3:  ASTHMA (ICD-493.90) improving Her updated medication list for this problem includes:    Proair Hfa 108 (90 Base) Mcg/act Aers (Albuterol sulfate) .Marland Kitchen... 1-2 puffs every 4 hours as needed shortness of breath    Singulair 10 Mg Tabs (Montelukast sodium) .Marland Kitchen... 1 by mouth once daily    Symbicort 160-4.5 Mcg/act Aero (Budesonide-formoterol fumarate) .Marland Kitchen... 1-2 puffs every 12 hrs ; gargle after use & swallow  Complete Medication List: 1)  Xalatan 0.005 % Soln (Latanoprost) .... Insert 1 drop into each eye nightly 2)  Zegerid Otc 20-1100 Mg Caps (Omeprazole-sodium bicarbonate) .... Prn 3)  Tylenol Ex St Arthritis Pain 500 Mg Tabs (Acetaminophen) .... Take 2 tab once daily as needed 4)  Vitamin D 2000 Unit Tabs (Cholecalciferol) .... Take 1 tablet by mouth 3-4 times per week 5)  Proair Hfa 108 (90 Base) Mcg/act Aers (Albuterol sulfate) .Marland Kitchen.. 1-2 puffs every 4 hours as needed shortness of breath 6)  Singulair 10 Mg Tabs (Montelukast sodium) .Marland Kitchen.. 1 by mouth once daily 7)  Magnesium Citrate 400 Mg Capsule  .... Take 1 tablet by mouth once a day 8)  Calcium Citrate 990 Mg Tablet  .... Take 1 tablet by mouth once a day 9)  Symbicort 160-4.5 Mcg/act Aero (Budesonide-formoterol fumarate) .Marland Kitchen.. 1-2 puffs every 12 hrs ; gargle after use & swallow 10)  Hyoscyamine Sulfate 0.125 Mg Subl (Hyoscyamine sulfate) .Marland Kitchen.. 1-2 by mouth q 4-6 hours as needed abdominal pain 11)  Allegra  180 Mg Tabs (Fexofenadine hcl) .... Otc 1 by mouth once daily (seasonal) 12)  Aspirin 81 Mg Tabs (Aspirin) .Marland Kitchen.. 1 by mouth once daily  Patient Instructions: 1)  Establish your normal peak  flow  to serve as baseline.

## 2010-10-12 NOTE — Assessment & Plan Note (Signed)
Summary: Throat Concerns/scm   Vital Signs:  Patient profile:   65 year old female Weight:      153 pounds BMI:     26.77 Temp:     98.8 degrees F oral Pulse rate:   72 / minute Resp:     12 per minute BP sitting:   118 / 70  (left arm) Cuff size:   large  Vitals Entered By: Shonna Chock CMA (June 19, 2010 3:17 PM) CC: Throat concerns and dull headache, URI symptoms   Primary Care Provider:  Marga Melnick MD  CC:  Throat concerns and dull headache and URI symptoms.  History of Present Illness: URI Symptoms      This is a 65 year old woman who presents with URI symptoms since 06/16/2010. On 10/08 she noted white patch L posterior pharynx. The patient reports sore throat and dry cough, but denies purulent nasal discharge, earache, and sick contacts.  Associated symptoms include ? fever( felt hot but no registered temp elevation).  The patient denies headache.  Risk factors for Strep sinusitis include bilateral facial pain.  The patient denies the following risk factors for Strep sinusitis: tooth pain and tender adenopathy. Rx: saline gargles   Current Medications (verified): 1)  Xalatan 0.005 %  Soln (Latanoprost) .... Insert 1 Drop Into Each Eye Nightly 2)  Tylenol Ex St Arthritis Pain 500 Mg  Tabs (Acetaminophen) .... Take 2 Tab Once Daily As Needed 3)  Vitamin D 1000 Unit Tabs (Cholecalciferol) .Marland Kitchen.. 1 By Mouth Once Daily 4)  Proair Hfa 108 (90 Base) Mcg/act Aers (Albuterol Sulfate) .Marland Kitchen.. 1-2 Puffs Every 4 Hours As Needed Shortness of Breath 5)  Singulair 10 Mg Tabs (Montelukast Sodium) .Marland Kitchen.. 1 By Mouth Once Daily 6)  Magnesium Citrate 400 Mg Capsule .... Take 1 Tablet By Mouth Once A Day 7)  Hyoscyamine Sulfate 0.125 Mg  Subl (Hyoscyamine Sulfate) .Marland Kitchen.. 1-2 By Mouth Q 4-6 Hours As Needed Abdominal Pain 8)  Allegra 180 Mg Tabs (Fexofenadine Hcl) .... Otc 1 By Mouth Once Daily (Seasonal) 9)  Prilosec Otc 20 Mg Tbec (Omeprazole Magnesium) .Marland Kitchen.. 1 By Mouth Once Daily 10)  Ester-C   Tabs (Bioflavonoid Products) .... 500mg  1 By Mouth Once Daily 11)  Dulera 200-5 Mcg/act Aero (Mometasone Furo-Formoterol Fum) .Marland Kitchen.. 1-2 Puffs Two Times A Day ; Gargle & Spit After Use  Allergies: 1)  ! Sulfa 2)  ! Ceclor 3)  ! Asa 4)  Relafen 5)  Premarin 6)  Prednisone 7)  * Solumedrol 8)  Levaquin 9)  * Belladonna 10)  * Gabapentin 11)  * Psyllium  Physical Exam  General:  well-nourished,in no acute distress; alert,appropriate and cooperative throughout examination Eyes:  No corneal or conjunctival inflammation noted. Perrla. Ears:  External ear exam shows no significant lesions or deformities.  Otoscopic examination reveals clear canals, tympanic membranes are intact bilaterally without bulging, retraction, inflammation or discharge. Hearing is grossly normal bilaterally. Nose:  External nasal examination shows no deformity or inflammation. Nasal mucosa are pink and moist without lesions or exudates. Mouth:  Oral mucosa and oropharynx without lesions or exudates.  Teeth in good repair. Punctate white lesion L tonsil w/o exudate . Hoarse Lungs:  Normal respiratory effort, chest expands symmetrically. Lungs are clear to auscultation, no crackles or wheezes. Dry cough Heart:  Normal rate and regular rhythm. S1 and S2 normal without gallop, murmur, click, rub or other extra sounds. Cervical Nodes:  No lymphadenopathy noted Axillary Nodes:  No palpable lymphadenopathy  Impression & Recommendations:  Problem # 1:  PHARYNGITIS-ACUTE (ICD-462) Beta Strep not suggested Her updated medication list for this problem includes:    Tylenol Ex St Arthritis Pain 500 Mg Tabs (Acetaminophen) .Marland Kitchen... Take 2 tab once daily as needed  Complete Medication List: 1)  Xalatan 0.005 % Soln (Latanoprost) .... Insert 1 drop into each eye nightly 2)  Tylenol Ex St Arthritis Pain 500 Mg Tabs (Acetaminophen) .... Take 2 tab once daily as needed 3)  Vitamin D 1000 Unit Tabs (Cholecalciferol) .Marland Kitchen.. 1 by mouth  once daily 4)  Proair Hfa 108 (90 Base) Mcg/act Aers (Albuterol sulfate) .Marland Kitchen.. 1-2 puffs every 4 hours as needed shortness of breath 5)  Singulair 10 Mg Tabs (Montelukast sodium) .Marland Kitchen.. 1 by mouth once daily 6)  Magnesium Citrate 400 Mg Capsule  .... Take 1 tablet by mouth once a day 7)  Hyoscyamine Sulfate 0.125 Mg Subl (Hyoscyamine sulfate) .Marland Kitchen.. 1-2 by mouth q 4-6 hours as needed abdominal pain 8)  Allegra 180 Mg Tabs (Fexofenadine hcl) .... Otc 1 by mouth once daily (seasonal) 9)  Prilosec Otc 20 Mg Tbec (Omeprazole magnesium) .Marland Kitchen.. 1 by mouth once daily 10)  Ester-c Tabs (Bioflavonoid products) .... 500mg  1 by mouth once daily 11)  Dulera 200-5 Mcg/act Aero (Mometasone furo-formoterol fum) .Marland Kitchen.. 1-2 puffs two times a day ; gargle & spit after use  Other Orders: Rapid Strep (16109)  Patient Instructions: 1)  Zinc lozenges or Zicam for sore throat. Consider using Water Pic as needed for tonsilar "plugs". Vitamin C 2000 mg once daily until well.Report fever, purulence, & facial pain. Neti pot once daily as needed for any nasal congestion. 2)  Drink as much  NON dairy fluid as you can tolerate for the next few days.

## 2010-10-12 NOTE — Progress Notes (Signed)
Summary: Kayla [name] alternative/samples  Phone Note Call from Patient Call back at [facility] Phone [phone]   Summary of Call: Patient called back about Dulera alternative. She states that [facility] will mail her a complete formulary in 7-10days. She can get Advair and Asmanex for $45. Would one of the suffice? Or should the pt be given some samples? (she will run out of Dulera on Monday and notes it is ok to call her Monday AM)  Please advise. Initial call taken by: Lucious Groves CMA,  September 15, 2010 2:57 PM  Follow-up for Phone Call        Advair 250/50 would be appropriate option Follow-up by: Marga Melnick MD,  September 15, 2010 4:34 PM  Additional Follow-up for Phone Call Additional follow up Details #1::        Left message on voicemail to call back to office. Lucious Groves CMA  September 18, 2010 11:27 AM   Patient notified. Lucious Groves CMA  September 18, 2010 11:31 AM     New/Updated Medications: ADVAIR DISKUS 250-50 MCG/DOSE AEPB (FLUTICASONE-SALMETEROL) 1 inhalation two times a day and gargle and spit after use Prescriptions: ADVAIR DISKUS 250-50 MCG/DOSE AEPB (FLUTICASONE-SALMETEROL) 1 inhalation two times a day and gargle and spit after use  #1 x 3   Entered by:   Lucious Groves CMA   Authorized by:   Marga Melnick MD   Signed by:   Lucious Groves CMA on 09/18/2010   Method used:   Electronically to        [facility]* (retail)       [address] [place], [place]  [address]       Ph: [phone]       Fax: [phone]   RxID:   [id number]

## 2010-10-12 NOTE — Assessment & Plan Note (Signed)
Summary: fluctuating bp/feeling weird/alr   Vital Signs:  Patient profile:   65 year old female Height:      63.5 inches Weight:      153.25 pounds Temp:     98.8 degrees F Pulse rate:   78 / minute Resp:     16 per minute BP sitting:   150 / 82  Vitals Entered By: Kandice Hams (December 15, 2009 12:39 PM) CC: c/o elevated bp Comments pt says woke up 3 am shaking all over, saysshe feels weird   Primary Care Provider:  Marga Melnick MD  CC:  c/o elevated bp.  History of Present Illness: Acute rigors last night with "weird  sensation of not doing well". She checked BP ; it ranged 147/82-156/90.P varied 57-69. No PMH of HTN.Rx: 3 baby ASA & "prayer". She  considered calling 911, but she did want expense of ER. Negative cardiac workup in 2005 for chest pain. Now she is fatigued. Paternal FH CAD: PGM,PGF,2 M aunts & 1 P uncle had MIs. The uncle was in his  19s.   Allergies: 1)  ! Sulfa 2)  ! Ceclor 3)  Relafen 4)  Premarin 5)  Prednisone 6)  * Solumedrol 7)  Levaquin 8)  * Belladonna 9)  * Gabapentin 10)  * Psyllium  Past History:  Past Medical History: Osteopenia 2005 chest pain,Dx :costochondritis based on Stress Test Asthma Diverticulosis; Diverticulitis 5/09, Dr Leone Payor Fibromyalgia GERD; gastric polyps X3 Glaucoma Irritable Bowel Syndrome Pneumonia  Urinary Tract Infection Remote  PMH of colon polyps 1998, all subsequent  coonoscopies negative   Review of Systems General:  Denies fever and sweats. ENT:  Denies nasal congestion and sinus pressure; No purulence. CV:  Complains of lightheadness and near fainting; denies difficulty breathing at night, difficulty breathing while lying down, palpitations, shortness of breath with exertion, swelling of feet, and swelling of hands; SS sharp pain @ rest evening of  04/05 which has not recurred. Resp:  Denies chest pain with inspiration, cough, coughing up blood, pleuritic, sputum productive, and wheezing; Positional LU  chest  chest pain 12/13/2009, it  resolved with position change. She feels asthma has flared recently; rescue inhaler used 2X/ day in last 2-3 days. Only using Symbicort 1 puff two times a day . Out of Singulair several days due to finances.. Derm:  Denies lesion(s) and rash. Neuro:  Complains of headaches; denies brief paralysis, disturbances in coordination, numbness, poor balance, sensation of room spinning, tingling, and weakness; Bitemporal mild headaches ; Rx: Tylenol, ES helps. No BPV.  Physical Exam  General:  well-nourished,in no acute distress; alert,appropriate and cooperative throughout examination; somewhat anxious Eyes:  No corneal or conjunctival inflammation noted. EOMI. Perrla; pupils small. No icterus Lungs:  Normal respiratory effort, chest expands symmetrically. Lungs : low grade wheezes w/o increased WOB. Heart:  Normal rate and regular rhythm. S1 and S2 normal without gallop, murmur, click, rub .S4 Abdomen:  Bowel sounds positive,abdomen soft and non-tender without masses, organomegaly or hernias noted. Pulses:  R and L carotid,radial,dorsalis pedis and posterior tibial pulses are full and equal bilaterally Extremities:  No clubbing, cyanosis, edema. Homan's negative  Neurologic:  alert & oriented X3, strength normal in all extremities, and DTRs symmetrical and normal.   Skin:  Intact without suspicious lesions or rashes Cervical Nodes:  No lymphadenopathy noted Axillary Nodes:  No palpable lymphadenopathy Psych:  memory intact for recent and remote, normally interactive, good eye contact,  but slightly anxious.     Impression &  Recommendations:  Problem # 1:  OTHER GENERAL SYMPTOMS (ICD-780.99)  ? rigor  Orders: TLB-CBC Platelet - w/Differential (85025-CBCD)  Problem # 2:  ELEVATED BLOOD PRESSURE WITHOUT DIAGNOSIS OF HYPERTENSION (ICD-796.2)  Orders: Venipuncture (96045) TLB-CBC Platelet - w/Differential (85025-CBCD) TLB-Cardiac Panel  ([id number]) T-D-Dimer Fibrin Derivatives Quantitive ([id number]) No Charge Patient Arrived (NCPA0) (NCPA0) T- * Misc. Laboratory test [phone])  Problem # 3:  ASTHMA (ICD-493.90) non compliance with meds; given samples to improve adherence Her updated medication list for this problem includes:    Proair Hfa 108 (90 Base) Mcg/act Aers (Albuterol sulfate) .Marland Kitchen... 1-2 puffs every 4 hours as needed shortness of breath    Singulair 10 Mg Tabs (Montelukast sodium) .Marland Kitchen... 1 by mouth once daily    Symbicort 160-4.5 Mcg/act Aero (Budesonide-formoterol fumarate) .Marland Kitchen... 1-2 puffs every 12 hrs ; gargle after use & swallow  Problem # 4:  CHEST PAIN (ICD-786.50) atypical (@ rest & seemingly positional ) , vague  but post menopausal & strong FH of CAD Orders: Venipuncture (95284) TLB-Cardiac Panel ([id number]) T-D-Dimer Fibrin Derivatives Quantitive ([id number]) No Charge Patient Arrived (NCPA0) (NCPA0) T- * Misc. Laboratory test [phone])  Complete Medication List: 1)  Xalatan 0.005 % Soln (Latanoprost) .... Insert 1 drop into each eye nightly 2)  Zegerid Otc 20-1100 Mg Caps (Omeprazole-sodium bicarbonate) .... Prn 3)  Tylenol Ex St Arthritis Pain 500 Mg Tabs (Acetaminophen) .... Take 2 tab once daily as needed 4)  Vitamin D 2000 Unit Tabs (Cholecalciferol) .... Take 1 tablet by mouth 3-4 times per week 5)  Proair Hfa 108 (90 Base) Mcg/act Aers (Albuterol sulfate) .Marland Kitchen.. 1-2 puffs every 4 hours as needed shortness of breath 6)  Singulair 10 Mg Tabs (Montelukast sodium) .Marland Kitchen.. 1 by mouth once daily 7)  Magnesium Citrate 400 Mg Capsule  .... Take 1 tablet by mouth once a day 8)  Calcium Citrate 990 Mg Tablet  .... Take 1 tablet by mouth once a day 9)  Symbicort 160-4.5 Mcg/act Aero (Budesonide-formoterol fumarate) .Marland Kitchen.. 1-2 puffs every 12 hrs ; gargle after use & swallow 10)  Hyoscyamine Sulfate 0.125 Mg Subl (Hyoscyamine sulfate) .Marland Kitchen.. 1-2 by mouth q 4-6 hours as needed abdominal pain  Other  Orders: EKG w/ Interpretation (93000)  Patient Instructions: 1)  Check your Blood Pressure regularly. If it is above: 140/90 ON AVERAGE  you should call. Use Symbicort 2 puffs two times a day & Singulair 10 mg once daily (Note: samples given).

## 2010-10-12 NOTE — Assessment & Plan Note (Signed)
Summary: flu shot/cbs  Nurse Visit Flu Vaccine Consent Questions     Do you have a history of severe allergic reactions to this vaccine? no    Any prior history of allergic reactions to egg and/or gelatin? no    Do you have a sensitivity to the preservative Thimersol? no    Do you have a past history of Guillan-Barre Syndrome? no    Do you currently have an acute febrile illness? no    Have you ever had a severe reaction to latex? no    Vaccine information given and explained to patient? yes    Are you currently pregnant? no    Lot Number:[id number]   Exp Date:03/10/2011   Site Given  Right Deltoid IM    Allergies: 1)  ! Sulfa 2)  ! Ceclor 3)  ! Asa 4)  Relafen 5)  Premarin 6)  Prednisone 7)  * Solumedrol 8)  Levaquin 9)  * Belladonna 10)  * Gabapentin 11)  * Psyllium  Orders Added: 1)  Admin 1st Vaccine [90471] 2)  Flu Vaccine 26yrs + [16109]

## 2010-10-12 NOTE — Progress Notes (Signed)
Summary: Appt sooner than next avail  Phone Note Call from Patient Call back at Home Phone [phone]   Call For: Dr Leone Payor Reason for Call: Talk to Nurse Summary of Call: Lower abd pain. Went to see Dr Alwyn Ren he sent her for labs which she just had today and he adviced her to get in with [place] as soon as possible. Needs to have an appoinment sooner than next available on 09-06-10 Initial call taken by: Leanor Kail [facility],  July 19, 2010 1:41 PM  Follow-up for Phone Call        Patient  is scheduled for REV with Dr Leone Payor for 05/24/10 9:30 Follow-up by: Darcey Nora RN, CGRN,  July 19, 2010 2:35 PM

## 2010-10-12 NOTE — Progress Notes (Signed)
  Phone Note Call from Patient Call back at Home Phone [phone]   Caller: Patient Summary of Call: Pt called was d/c from hospital c/o feeling after exertion, using her Proair 1 puff, wated to know should she be using a nebulizer? -Informed pt can use her inhaler 1-2 puffs every 4 hours as needed, also due for followup for ED. OV scheduled .Kandice Hams  December 19, 2009 10:49 AM  Initial call taken by: Kandice Hams,  December 19, 2009 10:49 AM
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
## 2010-10-12 NOTE — Progress Notes (Signed)
Summary: culture  Phone Note Call from Patient Call back at [facility] Phone [phone]   Summary of Call: Patient called for urine results and was made aware that culture is still pending.  Lucious Groves CMA,  July 05, 2010 10:29 AM  Culture received, please advise. Lucious Groves CMA  July 05, 2010 3:11 PM
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
## 2010-10-12 NOTE — Progress Notes (Signed)
----   Converted from flag ---- ---- 12/20/2009 5:26 PM, Cydney Ok, CCS-P, [facility] wrote: yes a level 5  Darl Pikes  ---- 12/15/2009 7:03 PM, Marga Melnick MD wrote: High complexity & high risk; ? level 5 ? Thanks for educating me. Hopp ------------------------------

## 2010-10-12 NOTE — Progress Notes (Signed)
Summary: Wants more than 30tabs of her medicine  Phone Note Call from Patient Call back at Home Phone [phone]   Call For: Dr Leone Payor Summary of Call: Has a prescription for Hyosciamine 30 tablets, just took her last one now. Sometimes when she gets into her situations she takes up to 4per day. This is not everyday but even it it happens once a month 30 tabs is not enough for one month. Wonders if we can ask physician if she can have 60 tabs per month? Switched pharmacy to [facility] on [address] Initial call taken by: Leanor Kail [facility],  September 22, 2010 10:12 AM  Follow-up for Phone Call        Called patient about RX told patient that Dr. Alwyn Ren has been prescribing her Hyoscyamine and if she needs more she will have to go thru his office for the RX. Pt stated that she wants Leone Payor to take over the RX. Explained to patient that Dr. Alwyn Ren will have to give that permission to Dr. Leone Payor and Dr Leone Payor is the only one if he takes over the RX that can change the quanity or amount. Patient stated that she understood and will contact Dr. Caryl Never office.  I told patient if she is out of the Hyoscyamine to get her RX filled as she normally did with Dr. Thea Gist I do not know how long it could take for Dr. Leone Payor to look at the request.  Follow-up by: Ok Anis CMA,  September 22, 2010 10:34 AM

## 2010-10-12 NOTE — Progress Notes (Signed)
Summary: Samples Request  Phone Note Call from Patient   Caller: Patient Details for Reason: Requesting samples Summary of Call: Rcvd mssg from pt requesting samples of Singular..C/B # [id number] Initial call taken by: Almeta Monas CMA Duncan Dull),  April 26, 2010 3:35 PM  Follow-up for Phone Call        Patient notified samples are up front and ready for pick up. Follow-up by: Lucious Groves CMA,  April 26, 2010 4:06 PM

## 2010-10-12 NOTE — Progress Notes (Signed)
Summary: Culture Results  Phone Note Outgoing Call Call back at [facility] Phone [phone]   Call placed by: Shonna Chock CMA,  July 06, 2010 10:01 AM Call placed to: Patient Summary of Call: Spoke with patient:  What is Levaquin allergy? Because of her allergies, Ciprofloxacin 500 mg two times a day X 10 days is best option by mouth   Patient indicated her reaction to Levaquin was rash and stomach ache (noted on allergy list)-Dr.Hopper was informed Patient aware rx for cipro sent to pharmacy./Chrae [facility] CMA  July 06, 2010 10:09 AM     Follow-up for Phone Call       Follow-up by: Shonna Chock CMA,  July 06, 2010 10:08 AM   New Allergies: LEVAQUIN New/Updated Medications: CIPROFLOXACIN HCL 500 MG TABS (CIPROFLOXACIN HCL) 1 by mouth two times a day New Allergies: LEVAQUINPrescriptions: CIPROFLOXACIN HCL 500 MG TABS (CIPROFLOXACIN HCL) 1 by mouth two times a day  #20 x 0   Entered by:   Shonna Chock CMA   Authorized by:   Marga Melnick MD   Signed by:   Shonna Chock CMA on 07/06/2010   Method used:   Electronically to        [facility] [phone]* (retail)       [address]       [place], [place]  [address]       Ph: [phone]       Fax: [phone]   RxID:   [id number]

## 2010-10-12 NOTE — Progress Notes (Signed)
Summary: Hyoscyamine rx  Phone Note Call from Patient Call back at Home Phone [phone]   Summary of Call: I spoke with the patient about her Hyoscyamine prescription. This prescription was given temporarily by [facility] for her incident that happened at [facility] in October, which has since resolved. She notes that she was previously receiving the prescription from [facility] for her GI issues. Patient is aware that Dr. Leone Payor will take back over the prescription since she is taking it due to her original GI issues. Pt notes that she needs prescription ASAP  Lucious Groves CMA,  September 22, 2010 10:38 AM  Follow-up for Phone Call        just make sure she gets refil Follow-up by: Marga Melnick MD,  September 22, 2010 11:23 AM  Additional Follow-up for Phone Call Additional follow up Details #1::        let her know its refilled Additional Follow-up by: Iva Boop MD, Clementeen Graham,  September 22, 2010 11:48 AM    Prescriptions: HYOSCYAMINE SULFATE 0.125 MG SUBL (HYOSCYAMINE SULFATE) 1 under tongue every 6 hrs as needed for abdominal symptoms  #60 x 3   Entered and Authorized by:   Iva Boop MD, [facility]   Signed by:   Iva Boop MD, [facility] on 09/22/2010   Method used:   Electronically to        [facility]* (retail)       [address] [place], [place]  [address]       Ph: [phone]       Fax: [phone]   RxID:   [id number]   Appended Document: Hyoscyamine rx Patient notified that rx was sent.

## 2010-10-12 NOTE — Progress Notes (Signed)
Summary: Elwin Sleight alternative  Phone Note Call from Patient Call back at [facility] Phone [phone]   Summary of Call: Patient called noting that she is now on Medicare and Dulera is not one the fomulary. She would like to know what else [facility] would recommend. Her pharmacy noted maybe mometasone.  Pt notes that she called the ins co. about her fomulary and if you still recommend Elwin Sleight it will cost a her couple hundred dollars. Please advise. Initial call taken by: Lucious Groves CMA,  September 14, 2010 11:50 AM  Follow-up for Phone Call        please obtain actual list of covered inhalers  Follow-up by: Marga Melnick MD,  September 14, 2010 2:34 PM  Additional Follow-up for Phone Call Additional follow up Details #1::        Left message on voicemail to call back to office. Lucious Groves CMA  September 14, 2010 3:04 PM   Patient notified and states that she will get the list for [place]. Lucious Groves CMA  September 14, 2010 3:15 PM

## 2010-10-12 NOTE — Progress Notes (Signed)
Summary: cough, hoariness  Phone Note Call from Patient Message from:  Patient  Caller: Patient Summary of Call: Pt left VM that she has about 5 days left of the Symbicort 160-4.5 Mcg. pt states that she is now experiencing some hoariness. Pt states that she is cough up some yellowish phlegm in AM. Pt states that she does have some SOB which she has used the rescue inhaler for which has help. pt has ov schedule for am but advise if symptoms worsen or if she start to experience difficulty breath she need to be see in UC prior to appt., pt verbalized understanding and ok UC..........Marland KitchenFelecia Deloach CMA  May 10, 2010 4:32 PM

## 2010-10-12 NOTE — Progress Notes (Signed)
Summary: Cream request  Phone Note Call from Patient Call back at Home Phone [phone]   Details for Reason: uses [facility] @ [address] Summary of Call: Before leaving the office patient requested Nystatin-Triamcinolone cream (larger tube) be sent to her pharmacy. She uses this for irritation from  hose/stockings and a tube will last her approximately 2 years (per patient). Please advise. Initial call taken by: Lucious Groves CMA,  July 03, 2010 12:25 PM  Follow-up for Phone Call        90 grams, Rx 1 Follow-up by: Marga Melnick MD,  July 03, 2010 12:52 PM  Additional Follow-up for Phone Call Additional follow up Details #1::        RX sent. Left message on voicemail notifying patient. Additional Follow-up by: Lucious Groves CMA,  July 03, 2010 3:12 PM    New/Updated Medications: NYSTATIN-TRIAMCINOLONE 100000-0.1 UNIT/GM-% CREA (NYSTATIN-TRIAMCINOLONE) apply to affected area as needed Prescriptions: NYSTATIN-TRIAMCINOLONE 100000-0.1 UNIT/GM-% CREA (NYSTATIN-TRIAMCINOLONE) apply to affected area as needed  #90 grams x 1   Entered by:   Lucious Groves CMA   Authorized by:   Marga Melnick MD   Signed by:   Lucious Groves CMA on 07/03/2010   Method used:   Electronically to        [facility] [phone]* (retail)       [address]       [place], [place]  [address]       Ph: [phone]       Fax: [phone]   RxID:   [id number] NYSTATIN-TRIAMCINOLONE 100000-0.1 UNIT/GM-% CREA (NYSTATIN-TRIAMCINOLONE) apply to affected area as needed  #90 grams x 0   Entered by:   Lucious Groves CMA   Authorized by:   Marga Melnick MD   Signed by:   Lucious Groves CMA on 07/03/2010   Method used:   Electronically to        [facility] [phone]* (retail)       [address]       [place], [place]  [address]       Ph: [phone]       Fax: [phone]   RxID:   [id number]

## 2010-10-12 NOTE — Assessment & Plan Note (Signed)
Summary: cramps in lower legs/kdc   Vital Signs:  Patient profile:   65 year old female Weight:      150.75 pounds Pulse rate:   70 / minute BP sitting:   150 / 78  Vitals Entered By: Kandice Hams (October 04, 2009 1:40 PM) CC: c/o calf pain at night   Primary Care Provider:  Marga Melnick MD  CC:  c/o calf pain at night.  History of Present Illness: Kayla [name] is a 65 year old female who presents today with c/o lower extremity cramping.  Notes that this pain is different from the typical "charlie horse" pain.  She takes calcium and magnesium and has been drinking tonic water and taking molasses.  She has not had improvement in the discomfort with these measures.  Notes that pain is improved by walking and worse at night.  Notes some soreness in  her calf muscle during the day.    Allergies: 1)  ! Sulfa 2)  ! Ceclor 3)  Relafen 4)  Premarin 5)  Prednisone 6)  * Solumedrol 7)  Levaquin 8)  * Belladonna 9)  * Gabapentin 10)  * Psyllium  Review of Systems       Denies fever.  Occasional sweating.  Notes some back pain.    Physical Exam  General:  Well-developed,well-nourished,in no acute distress; alert,appropriate and cooperative throughout examination Lungs:  Normal respiratory effort, chest expands symmetrically. Lungs are clear to auscultation, no crackles or wheezes. Heart:  Normal rate and regular rhythm. S1 and S2 normal without gallop, murmur, click, rub or other extra sounds. Msk:  No deformity or scoliosis noted of thoracic or lumbar spine.  no joint tenderness.   Pulses:  2+ DP/PT pulses bilaterally Neurologic:  strength normal in all extremities, gait normal, and DTRs symmetrical and normal in bilateral LE   Impression & Recommendations:  Problem # 1:  MUSCULOSKELETAL PAIN (ICD-781.99) Assessment New recommended NSAIDS- but patient is intolerant. Therefore recommended Tylenol heating pad as needed, stretching exercises.  F/u in 1 month if symptoms worsen or  do not improve. Fibromyalgia may also be playing a role in patient's symptoms.    Complete Medication List: 1)  Pamine 2.5 Mg Tabs (Methscopolamine bromide) .... Take 1 tablet by mouth as needed 2)  Xalatan 0.005 % Soln (Latanoprost) .... Insert 1 drop into each eye nightly 3)  Zegerid Otc 20-1100 Mg Caps (Omeprazole-sodium bicarbonate) .... Prn 4)  Tylenol Ex St Arthritis Pain 500 Mg Tabs (Acetaminophen) .... Take 2 tab once daily as needed 5)  Vitamin D 2000 Unit Tabs (Cholecalciferol) .... Take 1 tablet by mouth 3-4 times per week 6)  Proair Hfa 108 (90 Base) Mcg/act Aers (Albuterol sulfate) .Marland Kitchen.. 1-2 puffs every 4 hours as needed shortness of breath 7)  Singulair 10 Mg Tabs (Montelukast sodium) .Marland Kitchen.. 1 by mouth once daily 8)  Magnesium Citrate 400 Mg Capsule  .... Take 1 tablet by mouth once a day 9)  Calcium Citrate 990 Mg Tablet  .... Take 1 tablet by mouth once a day 10)  Symbicort 160-4.5 Mcg/act Aero (Budesonide-formoterol fumarate) .Marland Kitchen.. 1-2 puffs every 12 hrs ; gargle after use & swallow 11)  Hyoscyamine Sulfate 0.125 Mg Subl (Hyoscyamine sulfate) .Marland Kitchen.. 1-2 by mouth q 4-6 hours as needed abdominal pain  Patient Instructions: 1)  Continue stretching exerercises.   2)  Call us if your symptoms worsen or do not improve with the above measures.   3)  Take 650-1000mg  of Tylenol every 4-6 hours as  needed for relief of pain or comfort of fever AVOID taking more than 4000mg   in a 24 hour period (can cause liver damage in higher doses).

## 2010-10-12 NOTE — Progress Notes (Signed)
Summary: FYI CALL A NURSE STAT LAB D DIMER  Phone Note Other Incoming   Summary of Call: [facility] Triage Call Report Triage Record Num: [id number] Operator: Estevan Oaks Patient Name: Kayla [name] Call Date & Time: 12/15/2009 8:00:32PM Patient Phone: PCP: Marga Melnick Patient Gender: Female PCP Fax : Patient DOB: [birth year] Practice Name: [facility] Reason for Call: Delaney Meigs has called from [facility] [phone] with stat lab results. D-Dimer is 1.92. Note faxed to office for review. Protocol(s) Used: Office Note Recommended Outcome per Protocol: Information Noted and Sent to Office Reason for Outcome: Caller information to office Care Advice:  ~ 04/  Follow-up for Phone Call        patient in [facility] Follow-up by: Marga Melnick MD,  December 16, 2009 8:16 AM

## 2010-10-16 ENCOUNTER — Telehealth: Payer: Self-pay | Admitting: Internal Medicine

## 2010-10-26 NOTE — Progress Notes (Signed)
Summary: Advair update/question  Phone Note Call from Patient Call back at [facility] Phone [phone]   Summary of Call: Patient called noting that Advair is doing quite well and she feels that she is breathing much better. She notes that she still has a lot of wheezing/coughing from time to time and she is wondering if it could be fixed by Allegra or Zyrtec. If so, which one is better for indoor allergies?  Please advise. Initial call taken by: Lucious Groves CMA,  October 16, 2010 11:45 AM  Follow-up for Phone Call        they are both OTC ; Zyrtec can be sedating  & must be taken at bedtime as needed . They should not be taken together Follow-up by: Marga Melnick MD,  October 16, 2010 12:00 PM  Additional Follow-up for Phone Call Additional follow up Details #1::        Patient notified and now remembers that Zyrtec makes her very sleepy and she already takes Singulair at night. She notes that Claritin and Allegra have coupons in the paper so she will try one of those. Additional Follow-up by: Lucious Groves CMA,  October 16, 2010 1:49 PM

## 2010-11-01 ENCOUNTER — Telehealth: Payer: Self-pay | Admitting: Internal Medicine

## 2010-11-07 NOTE — Progress Notes (Signed)
Summary: ProAir sample  Phone Note Call from Patient Call back at [facility] Phone [phone]   Summary of Call: Patient called for sample of ProAir. She is aware it will be placed up front for pick up. Initial call taken by: Lucious Groves CMA,  November 01, 2010 11:00 AM    Prescriptions: PROAIR HFA 108 (90 BASE) MCG/ACT AERS (ALBUTEROL SULFATE) 1-2 puffs every 4 hours as needed shortness of breath  #1 x 0   Entered by:   Lucious Groves CMA   Authorized by:   Marga Melnick MD   Signed by:   Lucious Groves CMA on 11/01/2010   Method used:   Samples Given   RxID:   [id number]

## 2010-11-29 LAB — COMPREHENSIVE METABOLIC PANEL
Albumin: 4.3 g/dL (ref 3.5–5.2)
Alkaline Phosphatase: 78 U/L (ref 39–117)
Creatinine, Ser: 0.84 mg/dL (ref 0.4–1.2)
GFR calc Af Amer: >60 mL/min (ref >60)
Potassium: 4.9 mEq/L (ref 3.5–5.1)

## 2010-11-29 LAB — TROPONIN I: Troponin I: 0.01 ng/mL (ref 0.00–0.06)

## 2010-11-29 LAB — CARDIAC PANEL(CRET KIN+CKTOT+MB+TROPI)
CK, MB: 1.7 ng/mL (ref 0.3–4.0)
Total CK: 102 U/L (ref 7–177)
Total CK: 112 U/L (ref 7–177)
Troponin I: <0.01 ng/mL (ref 0.00–0.06)
Troponin I: <0.01 ng/mL (ref 0.00–0.06)

## 2010-11-29 LAB — PROTIME-INR: Prothrombin Time: 12.8 seconds (ref 11.6–15.2)

## 2010-11-29 LAB — URINALYSIS, ROUTINE W REFLEX MICROSCOPIC
Bilirubin Urine: NEGATIVE
Nitrite: NEGATIVE
Urobilinogen, UA: 0.2 mg/dL (ref 0.0–1.0)

## 2010-11-29 LAB — LIPID PANEL
Cholesterol: 190 mg/dL (ref 0–200)
HDL: 84 mg/dL (ref >39)
Total CHOL/HDL Ratio: 2.3 RATIO
VLDL: 6 mg/dL (ref 0–40)

## 2010-11-29 LAB — D-DIMER, QUANTITATIVE: D-Dimer, Quant: 1.24 ug/mL-FEU — ABNORMAL HIGH (ref 0.00–0.48)

## 2010-11-29 LAB — TSH: TSH: 7.332 u[IU]/mL — ABNORMAL HIGH (ref 0.350–4.500)

## 2010-11-29 LAB — CK TOTAL AND CKMB (NOT AT ARMC)
CK, MB: 2.7 ng/mL (ref 0.3–4.0)
Total CK: 120 U/L (ref 7–177)

## 2010-11-29 LAB — APTT: aPTT: 36 seconds (ref 24–37)

## 2010-12-07 ENCOUNTER — Other Ambulatory Visit: Payer: Self-pay | Admitting: *Deleted

## 2010-12-07 MED ORDER — FLUTICASONE PROPIONATE 50 MCG/ACT NA SUSP: 2.0000 | Freq: Every day | NASAL | Status: DC

## 2011-01-24 ENCOUNTER — Telehealth: Payer: Self-pay | Admitting: Internal Medicine

## 2011-01-24 NOTE — Telephone Encounter (Signed)
Would like sample of Dulera and Singular---(Medicare wont pay for [facility], but drug company sent her form for assistance) ----says Kayla [name] works best for her asthma--please call her when she can pick these up

## 2011-01-25 NOTE — Telephone Encounter (Signed)
Pt provided w/ samples of dulera and will send over paperwork to be able to get dulera since she has better relief w/ medication so no longer will taking Advair.
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
## 2011-01-26 NOTE — H&P (Signed)
NAME:  Kayla [name], SOLLERS                        ACCOUNT NO.:  [id number]   MEDICAL RECORD NO.:  [id number]                   PATIENT TYPE:  INP   LOCATION:  0375                                 FACILITY:  [facility]   PHYSICIAN:  Titus Dubin. Alwyn Ren, M.D. [facility]         DATE OF BIRTH:  06-30-1946   DATE OF ADMISSION:  04/17/2004  DATE OF DISCHARGE:                                HISTORY & PHYSICAL   HISTORY OF PRESENT ILLNESS:  [name]) was seen as a new patient  for an acute problem April 17, 2004.  This 65 year old female describes left  sided chest pain which began in her back and radiated to the front of the  chest.  It was severe enough to take my breath away.  It also extended  into the left arm and down that extremity.  It was associated with chills  and sweats on April 16, 2004.   Her symptoms began April 14, 2004, after being up for 2-3 hours without any  significant strenuous activity.  It seemed to be medial to the scapula on  the left.  When the nausea began, she took Zantac with some benefit.  The  pain was described as deep and dull and grabbing as if like a vise.  It  would last a few minutes and then persist as an aching discomfort.  She had  a similar episode several years ago, approximately 2001, in [place],  [place].  Apparently she had nuclear stress testing which was negative.  Because of this negative evaluation four years ago, she made the comment  that I was not terribly concerned this time.  On April 15, 2004, she had  profound malaise and was afraid to drive.  She had a bit of chest pain on  April 14, 2004, and April 16, 2004, and again the morning of the visit.  It  was because of the pain persisting that she called for the evaluation.   PAST MEDICAL HISTORY:  1. D&C in 1965 for dysfunctional bleeding.  2. She also was hospitalized with gastroenteritis.  3. She had a spinal fusion in 1995.  4. She is gravida 1, para 1.  She states she may have had a  possible     miscarriage.  5. She has low tension glaucoma.   FAMILY HISTORY:  Her mother had breast cancer.  Her father had congestive  heart failure and bypass grafting.  Paternal grandfather had heart disease.  Maternal grandmother had heart disease.  Paternal uncle had congestive heart  failure and died at 48.  Maternal aunt had bypass surgery.  Maternal great-  grandmother had diabetes.  Maternal aunt had breast cancer.  Maternal uncle  and maternal aunt also had heart disease.   SOCIAL HISTORY:  She does not smoke.   MEDICATIONS:  1. She is presently on ProGest cream.  2. Pamine 2.5 mg two as needed.  3. Zantac as needed.  4.  Xalatan eye drops at night in each eye.  5. Vitalea without iron.  6. Cal-Mag Plus.   ALLERGIES:  She is intolerant or allergic to SULFA, CECLOR, RELAFEN,  PREMARIN, PREDNISONE, MEDROL, __________ and LEVAQUIN.   REVIEW OF SYSTEMS:  Ear ache on April 16, 2004.  She has no purulent  secretions.  The remainder of the review of systems was discussed and was  negative.   PHYSICAL EXAMINATION:  GENERAL:  She was in no acute distress.  VITAL SIGNS:  Weight was 144.  Temperature was 97.9, pulse 64, respiratory  rate 16, and blood pressure 120/84.  HEENT:  Fundi were difficult to visualize.  Otologic exam was unremarkable  despite the history of earache.  NECK:  Thyroid was normal to palpation.  CHEST:  Clear was clear.  HEART:  She had an S4 versus a click.  All pulses were intact, and there  were no aneurysms.  EXTREMITIES:  Homan sign was negative.  ABDOMEN:  Before the abdominal examination, she had to roll on her side and  then drop in the supine position because of her back fusion.  She was  slightly tender in the epigastrium.  She had lymphadenopathy or  organomegaly.  BACK:  There is a long vertical operative scar of the lumbosacral area.  NEUROLOGICAL:  There were no neuropsychiatric deficits.   LABORATORY DATA:  The EKG revealed nonspecific  ST-T wave changes.   IMPRESSION:  She is a 65 year old white female with a history of worrisome  for crescendo angina.   PLAN:  She will be admitted to telemetry with cardiac enzymes and a D-dimer.  If the cardiac enzymes, CT of the chest will be performed.  Her chest pain  is in the context of prior negative evaluation four years ago and  phenomenally positive family history of coronary artery disease.  Fasting  lipids and homocystine levels will also be collected to help assess  cardiovascular risks.                                               Titus Dubin. Alwyn Ren, M.D. [facility]    WFH/MEDQ  D:  04/18/2004  T:  04/18/2004  Job:  [id number]

## 2011-03-01 ENCOUNTER — Telehealth: Payer: Self-pay | Admitting: Gastroenterology

## 2011-03-01 NOTE — Telephone Encounter (Signed)
Patient is calling to c/o lower abdominal pain with some cramping she has a 99.2 temp she feels this is elevated because she "runs a low temp all the time".  She does have some mucus in her stool.  She believes she is having a colitis flare.  She denies rectal bleeding, diarrhea or urgency.  I have asked her to continue her hyoscyamine 1-2 q 4-6 hours and alternating with Tylenol as she says this has greatly improved her symptoms since this am.  She is asked to go on a bland low residue diet for the weekend and if she doesn't see an improvement she is to call me back next week.

## 2011-03-01 NOTE — Telephone Encounter (Signed)
Left message for patient to call back  

## 2011-03-05 NOTE — Telephone Encounter (Signed)
ok 

## 2011-03-07 ENCOUNTER — Encounter: Payer: Self-pay | Admitting: Family Medicine

## 2011-03-07 ENCOUNTER — Ambulatory Visit (INDEPENDENT_AMBULATORY_CARE_PROVIDER_SITE_OTHER): Payer: Medicare Other | Admitting: Family Medicine

## 2011-03-07 ENCOUNTER — Other Ambulatory Visit: Payer: Self-pay

## 2011-03-07 VITALS — BP 124/82 | HR 67 | Temp 98.9°F | Wt 145.4 lb

## 2011-03-07 DIAGNOSIS — J209 Acute bronchitis, unspecified: Secondary | ICD-10-CM

## 2011-03-07 DIAGNOSIS — J40 Bronchitis, not specified as acute or chronic: Secondary | ICD-10-CM

## 2011-03-07 DIAGNOSIS — R05 Cough: Secondary | ICD-10-CM

## 2011-03-07 MED ORDER — ALBUTEROL SULFATE (2.5 MG/3ML) 0.083% IN NEBU
2.5000 mg | INHALATION_SOLUTION | Freq: Once | RESPIRATORY_TRACT | Status: AC
  Administered 2011-03-07: 2.5 mg via RESPIRATORY_TRACT

## 2011-03-07 MED ORDER — ALBUTEROL SULFATE (2.5 MG/3ML) 0.083% IN NEBU: 2.5000 mg | INHALATION_SOLUTION | Freq: Four times a day (QID) | RESPIRATORY_TRACT | Status: DC | PRN

## 2011-03-07 MED ORDER — ALBUTEROL SULFATE HFA 108 (90 BASE) MCG/ACT IN AERS: 2.0000 | INHALATION_SPRAY | Freq: Four times a day (QID) | RESPIRATORY_TRACT | Status: DC | PRN

## 2011-03-07 MED ORDER — AMOXICILLIN-POT CLAVULANATE 875-125 MG PO TABS: 1.0000 | ORAL_TABLET | Freq: Two times a day (BID) | ORAL | Status: AC

## 2011-03-07 NOTE — Patient Instructions (Signed)
Bronchitis Bronchitis is the body's way of reacting to injury and/or infection (inflammation) of the bronchi. Bronchi are the air tubes that extend from the windpipe into the lungs. If the inflammation becomes severe, it may cause shortness of breath.  CAUSES Inflammation may be caused by:  A virus.   Germs (bacteria).   Dust.   Allergens.   Pollutants and many other irritants.  The cells lining the bronchial tree are covered with tiny hairs (cilia). These constantly beat upward, away from the lungs, toward the mouth. This keeps the lungs free of pollutants. When these cells become too irritated and are unable to do their job, mucus begins to develop. This causes the characteristic cough of bronchitis. The cough clears the lungs when the cilia are unable to do their job. Without either of these protective mechanisms, the mucus would settle in the lungs. Then you would develop pneumonia. Smoking is a common cause of bronchitis and can contribute to pneumonia. Stopping this habit is the single most important thing you can do to help yourself. TREATMENT  Your caregiver may prescribe an antibiotic if the cough is caused by bacteria. Also, medicines that open up your airways make it easier to breathe. Your caregiver may also recommend or prescribe an expectorant. It will loosen the mucus to be coughed up. Only take over-the-counter or prescription medicines for pain, discomfort, or fever as directed by your caregiver.   Removing whatever causes the problem (smoking, for example) is critical to preventing the problem from getting worse.   Cough suppressants may be prescribed for relief of cough symptoms.   Inhaled medicines may be prescribed to help with symptoms now and to help prevent problems from returning.   For those with recurrent (chronic) bronchitis, there may be a need for steroid medicines.  SEEK IMMEDIATE MEDICAL CARE IF:  During treatment, you develop more pus-like mucus  (purulent sputum).   You or your child has an oral temperature above 100.4, not controlled by medicine.   Your baby is older than 3 months with a rectal temperature of 102 F (38.9 C) or higher.   Your baby is 3 months old or younger with a rectal temperature of 100.4 F (38 C) or higher.   You become progressively more ill.   You have increased difficulty breathing, wheezing, or shortness of breath.  It is necessary to seek immediate medical care if you are elderly or sick from any other disease. MAKE SURE YOU:  Understand these instructions.   Will watch your condition.   Will get help right away if you are not doing well or get worse.  Document Released: 08/27/2005 Document Re-Released: 11/21/2009 Exit[employer] Patient Information 2011 [employer]. 

## 2011-03-07 NOTE — Progress Notes (Signed)
  Subjective:     [place]la [name] is a 65 y.o. female here for evaluation of a cough. Onset of symptoms was 3 weeks ago. Symptoms have been gradually worsening since that time. The cough is barky and productive and is aggravated by pollens. Associated symptoms include: change in voice, chest pain, shortness of breath, sputum production and wheezing. Patient does have a history of asthma. Patient does have a history of environmental allergens. Patient has not traveled recently. Patient does have a history of smoking. Patient has had a previous chest x-ray. Patient has not had a PPD done.  The following portions of the patient's history were reviewed and updated as appropriate: allergies, current medications, past family history, past medical history, past social history, past surgical history and problem list.  Review of Systems Pertinent items are noted in HPI.    Objective:    Oxygen saturation 99% on room air BP 124/82  Pulse 67  Temp(Src) 98.9 F (37.2 C) (Oral)  Wt 145 lb 6.4 oz (65.953 kg)  SpO2 99% General appearance: alert, cooperative, appears stated age and no distress Ears: normal TM's and external ear canals both ears Nose: Nares normal. Septum midline. Mucosa normal. No drainage or sinus tenderness. Throat: lips, mucosa, and tongue normal; teeth and gums normal Neck: no adenopathy, no carotid bruit, no JVD, supple, symmetrical, trachea midline and thyroid not enlarged, symmetric, no tenderness/mass/nodules Lungs: diminished breath sounds bilaterally, wheezes bilaterally and cleared with neb Heart: regular rate and rhythm, S1, S2 normal, no murmur, click, rub or gallop Skin: Skin color, texture, turgor normal. No rashes or lesions    Assessment:    Acute Bronchitis    Plan:    Antibiotics per medication orders. Avoid exposure to tobacco smoke and fumes. B-agonist inhaler. Call if shortness of breath worsens, blood in sputum, change in character of cough, development of  fever or chills, inability to maintain nutrition and hydration. Avoid exposure to tobacco smoke and fumes. mucinex for cough,  con't dulera  F/u pcp if no better by Friday

## 2011-03-09 ENCOUNTER — Ambulatory Visit (HOSPITAL_BASED_OUTPATIENT_CLINIC_OR_DEPARTMENT_OTHER)
Admission: RE | Admit: 2011-03-09 | Discharge: 2011-03-09 | Disposition: A | Discharge status: Home / Self Care | Payer: Medicare Other | Source: Ambulatory Visit | Attending: Family Medicine | Admitting: Family Medicine

## 2011-03-09 ENCOUNTER — Telehealth: Payer: Self-pay

## 2011-03-09 DIAGNOSIS — J45909 Unspecified asthma, uncomplicated: Secondary | ICD-10-CM

## 2011-03-09 DIAGNOSIS — R05 Cough: Secondary | ICD-10-CM

## 2011-03-09 DIAGNOSIS — R059 Cough, unspecified: Secondary | ICD-10-CM | POA: Insufficient documentation

## 2011-03-09 NOTE — Telephone Encounter (Signed)
Discussed with patient and she will go to the med center.Marland KitchenMarland KitchenOrder put in     [place]

## 2011-03-09 NOTE — Telephone Encounter (Signed)
Message from patient seen 03/07/11 stated she was still having chest tightness and cough with thick mucus, she had been using the nebulizer and getting some relief but wants to go ahead and get the chest X-Ray done, since cough had not completely gone away.... Please advise     KP

## 2011-03-09 NOTE — Telephone Encounter (Signed)
Ok-- find out where she wants to get it and we will put order in.

## 2011-03-27 ENCOUNTER — Encounter: Payer: Self-pay | Admitting: Internal Medicine

## 2011-04-05 ENCOUNTER — Telehealth: Payer: Self-pay | Admitting: Internal Medicine

## 2011-04-05 DIAGNOSIS — R05 Cough: Secondary | ICD-10-CM

## 2011-04-05 NOTE — Telephone Encounter (Signed)
Pt called husband doesn't feel comfortable having drive to [place] so pt would like to go to some place local informed that we could refer to [facility].

## 2011-04-05 NOTE — Telephone Encounter (Signed)
Pt says that current medication isn't really helping with chest congestion and cough. Says something triggers chest tightness along w/ thick mucus would like to know if Dr. Alwyn Ren thinks she a pulmonary or allergist. If ok w/ allergist she would like to use Dr. listed below  [facility]

## 2011-04-05 NOTE — Telephone Encounter (Signed)
OK to refer to [facility]

## 2011-04-08 ENCOUNTER — Emergency Department (HOSPITAL_COMMUNITY): Payer: Medicare Other

## 2011-04-08 ENCOUNTER — Emergency Department (HOSPITAL_COMMUNITY)
Admission: EM | Admit: 2011-04-08 | Discharge: 2011-04-08 | Disposition: A | Discharge status: Home / Self Care | Payer: Medicare Other | Attending: Emergency Medicine | Admitting: Emergency Medicine

## 2011-04-08 DIAGNOSIS — R5381 Other malaise: Secondary | ICD-10-CM | POA: Insufficient documentation

## 2011-04-08 DIAGNOSIS — R5383 Other fatigue: Secondary | ICD-10-CM | POA: Insufficient documentation

## 2011-04-08 DIAGNOSIS — R0789 Other chest pain: Secondary | ICD-10-CM | POA: Insufficient documentation

## 2011-04-08 DIAGNOSIS — H409 Unspecified glaucoma: Secondary | ICD-10-CM | POA: Insufficient documentation

## 2011-04-08 DIAGNOSIS — J45909 Unspecified asthma, uncomplicated: Secondary | ICD-10-CM | POA: Insufficient documentation

## 2011-04-08 LAB — TROPONIN I
Troponin I: <0.3 ng/mL (ref <0.30)
Troponin I: <0.3 ng/mL (ref <0.30)

## 2011-04-08 LAB — POCT I-STAT, CHEM 8
Calcium, Ion: 1.2 mmol/L (ref 1.12–1.32)
Creatinine, Ser: 0.8 mg/dL (ref 0.50–1.10)
Glucose, Bld: 100 mg/dL — ABNORMAL HIGH (ref 70–99)
HCT: 39 % (ref 36.0–46.0)
Hemoglobin: 13.3 g/dL (ref 12.0–15.0)
Potassium: 3.7 mEq/L (ref 3.5–5.1)
TCO2: 25 mmol/L (ref 0–100)

## 2011-04-08 LAB — CBC
Hemoglobin: 13.2 g/dL (ref 12.0–15.0)
MCH: 30.2 pg (ref 26.0–34.0)
MCHC: 33.7 g/dL (ref 30.0–36.0)
RDW: 14 % (ref 11.5–15.5)

## 2011-04-09 ENCOUNTER — Ambulatory Visit (INDEPENDENT_AMBULATORY_CARE_PROVIDER_SITE_OTHER): Payer: Medicare Other | Admitting: Internal Medicine

## 2011-04-09 ENCOUNTER — Encounter: Payer: Self-pay | Admitting: Internal Medicine

## 2011-04-09 DIAGNOSIS — R05 Cough: Secondary | ICD-10-CM | POA: Insufficient documentation

## 2011-04-09 DIAGNOSIS — K219 Gastro-esophageal reflux disease without esophagitis: Secondary | ICD-10-CM

## 2011-04-09 MED ORDER — TRAMADOL HCL 50 MG PO TABS
ORAL_TABLET | ORAL | Status: AC
Start: 2011-04-09 — End: 2011-04-19

## 2011-04-09 MED ORDER — RANITIDINE HCL 75 MG PO TABS: ORAL_TABLET | ORAL | Status: DC

## 2011-04-09 MED ORDER — ESOMEPRAZOLE MAGNESIUM 40 MG PO CPDR: 40.0000 mg | DELAYED_RELEASE_CAPSULE | Freq: Every day | ORAL | Status: DC

## 2011-04-09 NOTE — Assessment & Plan Note (Signed)
The most common causes of chronic cough in immunocompetent adults include the following: upper airway cough syndrome (UACS), previously referred to as postnasal drip syndrome (PNDS), which is caused by variety of rhinosinus conditions; (2) asthma; (3) GERD; (4) chronic bronchitis from cigarette smoking or other inhaled environmental irritants; (5) nonasthmatic eosinophilic bronchitis; and (6) bronchiectasis.   These conditions, singly or in combination, have accounted for up to 94% of the causes of chronic cough in prospective studies.   Other conditions have constituted no >6% of the causes in prospective studies These have included bronchogenic carcinoma, chronic interstitial pneumonia, sarcoidosis, left ventricular failure, ACEI-induced cough, and aspiration from a condition associated with pharyngeal dysfunction.  This is probably  Classic Upper airway cough syndrome, so named because it's frequently impossible to sort out how much is  CR/sinusitis with freq throat clearing (which can be related to primary GERD)   vs  causing  secondary (" extra esophageal")  GERD from wide swings in gastric pressure that occur with throat clearing, often  promoting self use of mint and menthol lozenges that reduce the lower esophageal sphincter tone and exacerbate the problem further in a cyclical fashion.   These are the same pts who not infrequently have failed to tolerate ace inhibitors,  dry powder inhalers or biphosphonates or report having reflux symptoms that don't respond to standard doses of PPI , and are easily confused as having aecopd or asthma flares,  For now try on max gerd rx and control cough with tramadol "wheezing" and sob with maintenance singulair and prn neb x 2 weeks only then regroup

## 2011-04-09 NOTE — Progress Notes (Signed)
Subjective:     Patient ID: Kayla [name], female   DOB: 1946/05/23, 65 y.o.   MRN: [id number]  HPI  [id number]  quit smoking 1976 with tendency to" throat infections" that improved then some worse in mid 90's attributed to environment in [place] better after returning to [place] until around 2009/2010 and since then chronically symptomatic requiring maint rx per Dr Alwyn Ren    04/09/2011 Initial pulmonary office eval in EMR era p trip to ER with chest discomfort attributed to asthma attack despite maint rx with dulera 200/ singulair/  Daily symptoms x sev months feels needs to cough but takes a lot of effort,  Cough worse after supper, not so bad sleeping or when wakes up.  Seems better after nebulizer.  No excess mucus production. Cough has a harsh grinding/ barking quality assoc with sense of chest but not nasal congestion and sob mostly when coughing.    Pt denies any significant sore throat, dysphagia, itching, sneezing,  nasal congestion or excess/ purulent secretions,  fever, chills, sweats, unintended wt loss, pleuritic or exertional cp, hempoptysis, orthopnea pnd or leg swelling.    Also denies any obvious fluctuation of symptoms with weather or environmental changes or other aggravating or alleviating factors.       Review of Systems  Constitutional: Negative for fever, chills and unexpected weight change.  HENT: Positive for ear pain and congestion. Negative for nosebleeds, sore throat, rhinorrhea, sneezing, trouble swallowing, dental problem, voice change, postnasal drip and sinus pressure.   Eyes: Negative for visual disturbance.  Respiratory: Positive for cough, chest tightness and shortness of breath. Negative for choking.   Cardiovascular: Negative for chest pain and leg swelling.  Gastrointestinal: Negative for vomiting, abdominal pain and diarrhea.  Genitourinary: Negative for difficulty urinating.  Musculoskeletal: Negative for arthralgias.  Skin: Negative for rash.    Neurological: Negative for tremors, syncope and headaches.  Hematological: Does not bruise/bleed easily.       Objective:   Physical Exam Elderly wf with barking quality cough otherwise quite anxious,  nad  Wt  148 04/09/2011  HEENT mild turbinate edema.  Oropharynx no thrush or excess pnd or cobblestoning.  No JVD or cervical adenopathy. Mild accessory muscle hypertrophy. Trachea midline, nl thryroid. Chest was hyperinflated by percussion with diminished breath sounds and moderate increased exp time without wheeze. Hoover sign positive at mid inspiration. Regular rate and rhythm without murmur gallop or rub or increase P2 or edema.  Abd: no hsm, nl excursion. Ext warm without cyanosis or clubbing.    cxr 04/08/11 1. No acute cardiopulmonary abnormalities.     Assessment:         Plan:

## 2011-04-09 NOTE — Patient Instructions (Addendum)
Start Nexium 40 mg Take 30-60 min before first meal of the day   Zantac 150 mg one at bedtime automatically, not as needed   Continue singulair 10 mg one each pm  If you have a problem coughing, take Tramadol 50 mg one every 4 hours as needed  If you have a trouble breathing use the nebulizer up to every 4 hours as needed   GERD (REFLUX)  is an extremely common cause of respiratory symptoms, many times with no significant heartburn at all.    It can be treated with medication, but also with lifestyle changes including avoidance of late meals, excessive alcohol, smoking cessation, and avoid fatty foods, chocolate, peppermint, colas, red wine, and acidic juices such as orange juice.  NO MINT OR MENTHOL PRODUCTS SO NO COUGH DROPS  USE SUGARLESS CANDY INSTEAD (jolley ranchers or Stover's)  NO OIL BASED VITAMINS   Please schedule a follow up office visit in 2  weeks, sooner if needed

## 2011-04-09 NOTE — Assessment & Plan Note (Signed)
Of the three most common causes of chronic cough, only one (GERD)  can actually cause the other two (asthma and post nasal drip syndrome)  and perpetuate the cylce of cough inducing airway trauma, inflammation, heightened sensitivity to reflux which is prompted by the cough itself via a cyclical mechanism.    This may partially respond to steroids and look like asthma and post nasal drainage but never erradicated completely unless the cough and the secondary reflux are eliminated, preferably both at the same time.  While not intuitively obvious, many patients with chronic low grade reflux do not cough until there is a secondary insult that disturbs the protective epithelial barrier and exposes sensitive nerve endings.  This can be viral or direct physical injury such as with an endotracheal tube.   The point is that once this occurs, it is difficult to eliminate using anything but a maximally effective acid suppression regimen at least in the short run, accompanied by an appropriate diet to address non acid GERD.  

## 2011-04-11 DIAGNOSIS — R7301 Impaired fasting glucose: Secondary | ICD-10-CM

## 2011-04-11 HISTORY — DX: Impaired fasting glucose: R73.01

## 2011-04-20 ENCOUNTER — Telehealth: Payer: Self-pay | Admitting: Internal Medicine

## 2011-04-20 ENCOUNTER — Encounter: Payer: Self-pay | Admitting: Adult Health

## 2011-04-20 ENCOUNTER — Ambulatory Visit (INDEPENDENT_AMBULATORY_CARE_PROVIDER_SITE_OTHER): Payer: Medicare Other | Admitting: Adult Health

## 2011-04-20 DIAGNOSIS — R059 Cough, unspecified: Secondary | ICD-10-CM

## 2011-04-20 DIAGNOSIS — R05 Cough: Secondary | ICD-10-CM

## 2011-04-20 NOTE — Telephone Encounter (Signed)
Spoke with the pt and she is c/o having PND, head congestion, productive cough with yellow phlegm, body aches and chills x 2 days. She was to f/u with MW on Monday but wants an appt today before hte weekend. Pt set to see TP today at 2:30. Carron Curie, CMA

## 2011-04-20 NOTE — Patient Instructions (Addendum)
Continue Nexium 40 mg Take 30-60 min before first meal of the day   Zantac 150 mg one at bedtime automatically  Continue singulair 10 mg one each pm  Mucinex DM Twice daily  As needed  Cough/congestion  ( or mucinex plus delsym)   Fluids and rest.   If you have a problem of coughing, take Tramadol 50 mg one every 4 hours as needed  If you have a trouble breathing use the nebulizer up to every 4 hours as needed   GERD (REFLUX)  is an extremely common cause of respiratory symptoms, many times with no significant heartburn at all.    It can be treated with medication, but also with lifestyle changes including avoidance of late meals, excessive alcohol, smoking cessation, and avoid fatty foods, chocolate, peppermint, colas, red wine, and acidic juices such as orange juice.  NO MINT OR MENTHOL PRODUCTS SO NO COUGH DROPS  USE SUGARLESS CANDY INSTEAD (jolley ranchers or Stover's)  NO OIL BASED VITAMINS   follow up Dr. Sherene Sires  In 1 week and As needed    Please contact office for sooner follow up if symptoms do not improve or worsen or seek emergency care

## 2011-04-20 NOTE — Assessment & Plan Note (Addendum)
Improved control with GERD/Cough prevention  Now w/ flare with URI - will hold on abx and steroids as this appears to viral in nature with no active wheezing/bronchospasm.  She had considerable improvement off ICS/SABA use -doubt asthma  Improved with reflux control   Plan:  Continue Nexium 40 mg Take 30-60 min before first meal of the day   Zantac 150 mg one at bedtime automatically  Continue singulair 10 mg one each pm  Mucinex DM Twice daily  As needed  Cough/congestion  ( or mucinex plus delsym)   Fluids and rest.   If you have a problem of coughing, take Tramadol 50 mg one every 4 hours as needed  If you have a trouble breathing use the nebulizer up to every 4 hours as needed   GERD (REFLUX)  is an extremely common cause of respiratory symptoms, many times with no significant heartburn at all.    It can be treated with medication, but also with lifestyle changes including avoidance of late meals, excessive alcohol, smoking cessation, and avoid fatty foods, chocolate, peppermint, colas, red wine, and acidic juices such as orange juice.  NO MINT OR MENTHOL PRODUCTS SO NO COUGH DROPS  USE SUGARLESS CANDY INSTEAD (jolley ranchers or Stover's)  NO OIL BASED VITAMINS   follow up Dr. Sherene Sires  In 1 week and As needed    Please contact office for sooner follow up if symptoms do not improve or worsen or seek emergency care

## 2011-04-20 NOTE — Progress Notes (Signed)
Subjective:     Patient ID: Kayla [name], female   DOB: 1946/02/13, 66 y.o.   MRN: [id number]  HPI  64yowf  quit smoking 1976 with tendency to" throat infections" that improved then some worse in mid 90's attributed to environment in [place] better after returning to [place] until around 2009/2010 and since then chronically symptomatic requiring maint rx per Dr Alwyn Ren    04/09/2011 Initial pulmonary office  eval in EMR era p trip to ER with chest discomfort attributed to asthma attack despite maint rx with dulera 200/ singulair/  Daily symptoms x sev months feels needs to cough but takes a lot of effort,  Cough worse after supper, not so bad sleeping or when wakes up.  Seems better after nebulizer.  No excess mucus production. Cough has a harsh grinding/ barking quality assoc with sense of chest but not nasal congestion and sob mostly when coughing.   >>rx nexium and zantac , stopped dulera , tramadol for cough ,    04/20/2011 Acute OV  Presents for an acute office visit.  Complains of prod cough with thick yellow mucus, sneezing,- onset this morning > reports has been battling a "traditional head cold" x4days.  Says symptoms started with tickle in throat, stuffy nose, aches, low grade fevers. "feels like  I have a cold" . Cough is different than it was before. No discolored mucus . CXR last ov with no acute process. No hemoptysis .   Last visit with pulmonary consult  For cough. She was taken off her Dulera . tx w/ GERD regimen. Tramadol for cough. Says she felt so much better was 95% better ("best in more than 2 years ") .      Review of Systems  Constitutional: Negative for  unexpected weight change.  HENT: Positive for ear pain and congestion. Negative for nosebleeds, sore throat, rhinorrhea, sneezing, trouble swallowing, dental problem, voice change, postnasal drip and sinus pressure.   Eyes: Negative for visual disturbance.  Respiratory: Positive for cough, chest tightness and  shortness of breath. Negative for choking.   Cardiovascular: Negative for chest pain and leg swelling.  Gastrointestinal: Negative for vomiting, abdominal pain and diarrhea.  Genitourinary: Negative for difficulty urinating.  Musculoskeletal: Negative for arthralgias.  Skin: Negative for rash.  Neurological: Negative for tremors, syncope and headaches.  Hematological: Does not bruise/bleed easily.       Objective:   Physical Exam Elderly wf with barking quality cough otherwise quite anxious,  nad  Wt  148 04/09/2011 >>145 04/20/2011  HEENT mild turbinate edema.  Oropharynx no thrush or excess pnd or cobblestoning.  No JVD or cervical adenopathy. Mild accessory muscle hypertrophy. Trachea midline, nl thryroid. Chest was hyperinflated by percussion with diminished breath sounds and moderate increased exp time without wheeze. Hoover sign positive at mid inspiration. Regular rate and rhythm without murmur gallop or rub or increase P2 or edema.  Abd: no hsm, nl excursion. Ext warm without cyanosis or clubbing.    cxr 04/08/11 1. No acute cardiopulmonary abnormalities.     Assessment:         Plan:

## 2011-04-23 ENCOUNTER — Encounter: Payer: Self-pay | Admitting: Internal Medicine

## 2011-04-23 ENCOUNTER — Telehealth: Payer: Self-pay | Admitting: Internal Medicine

## 2011-04-23 ENCOUNTER — Ambulatory Visit (INDEPENDENT_AMBULATORY_CARE_PROVIDER_SITE_OTHER): Payer: Medicare Other | Admitting: Internal Medicine

## 2011-04-23 ENCOUNTER — Ambulatory Visit (INDEPENDENT_AMBULATORY_CARE_PROVIDER_SITE_OTHER)
Admission: RE | Admit: 2011-04-23 | Discharge: 2011-04-23 | Disposition: A | Discharge status: Home / Self Care | Payer: Medicare Other | Source: Ambulatory Visit | Attending: Internal Medicine | Admitting: Internal Medicine

## 2011-04-23 ENCOUNTER — Other Ambulatory Visit (INDEPENDENT_AMBULATORY_CARE_PROVIDER_SITE_OTHER): Payer: Medicare Other

## 2011-04-23 VITALS — BP 160/90 | HR 70 | Temp 98.2°F | Ht 64.5 in | Wt 146.0 lb

## 2011-04-23 DIAGNOSIS — R05 Cough: Secondary | ICD-10-CM

## 2011-04-23 DIAGNOSIS — I10 Essential (primary) hypertension: Secondary | ICD-10-CM

## 2011-04-23 DIAGNOSIS — R350 Frequency of micturition: Secondary | ICD-10-CM

## 2011-04-23 DIAGNOSIS — J45909 Unspecified asthma, uncomplicated: Secondary | ICD-10-CM

## 2011-04-23 LAB — URINALYSIS
Bilirubin Urine: NEGATIVE
Ketones, ur: NEGATIVE
Leukocytes, UA: NEGATIVE
Nitrite: NEGATIVE
Specific Gravity, Urine: <=1.005 (ref 1.000–1.030)
Urobilinogen, UA: 0.2 (ref 0.0–1.0)
pH: 7 (ref 5.0–8.0)

## 2011-04-23 LAB — CBC WITH DIFFERENTIAL/PLATELET
Eosinophils Absolute: 0 10*3/uL (ref 0.0–0.7)
HCT: 39.2 % (ref 36.0–46.0)
Lymphs Abs: 0.6 10*3/uL — ABNORMAL LOW (ref 0.7–4.0)
MCHC: 34.2 g/dL (ref 30.0–36.0)
MCV: 89.9 fl (ref 78.0–100.0)
Monocytes Absolute: 0.4 10*3/uL (ref 0.1–1.0)
Neutrophils Relative %: 85 % — ABNORMAL HIGH (ref 43.0–77.0)
Platelets: 139 10*3/uL — ABNORMAL LOW (ref 150.0–400.0)
RDW: 13.5 % (ref 11.5–14.6)

## 2011-04-23 LAB — BASIC METABOLIC PANEL
Calcium: 9.2 mg/dL (ref 8.4–10.5)
GFR: 89.09 mL/min (ref >60.00)
Glucose, Bld: 108 mg/dL — ABNORMAL HIGH (ref 70–99)
Potassium: 5.1 mEq/L (ref 3.5–5.1)
Sodium: 136 mEq/L (ref 135–145)

## 2011-04-23 MED ORDER — AZITHROMYCIN 250 MG PO TABS: ORAL_TABLET | ORAL | Status: AC

## 2011-04-23 MED ORDER — NEBIVOLOL HCL 5 MG PO TABS: 5.0000 mg | ORAL_TABLET | Freq: Every day | ORAL | Status: DC

## 2011-04-23 NOTE — Telephone Encounter (Signed)
Spoke with pt and she states her bp is 169/87 and she feels weak. Pt c/o sinus congestion, legs feel weird, light headed and is coughing. Pt states she has been taking mucinex and delsym as directed. Pt wanted to come in and be evaluated by MW. Pt is coming in at 11:45.

## 2011-04-23 NOTE — Patient Instructions (Signed)
No more delsym  For cough use tramadol  For breathing use the nebulizer > if find you start needing it more than a couple of times a week need re-evaulation here   Bystolic 5 mg one  each am    If you are satisfied with your treatment plan let your doctor know and he/she can either refill your medications or you can return here when your prescription runs out.     If in any way you are not 100% satisfied,  please tell us.  If 100% better, tell your friends!

## 2011-04-23 NOTE — Progress Notes (Signed)
Subjective:     Patient ID: Kayla [name], female   DOB: 03-03-1946, 65 y.o.   MRN: [id number]  HPI  64yowf  quit smoking 1976 with tendency to" throat infections" that improved then some worse in mid 90's attributed to environment in [place] better after returning to [place] until around 2009/2010 and since then chronically symptomatic requiring maint rx per Dr Alwyn Ren    04/09/2011 Initial pulmonary office eval in EMR era p trip to ER with chest discomfort attributed to asthma attack despite maint rx with dulera 200/ singulair/  Daily symptoms x sev months feels needs to cough but takes a lot of effort,  Cough worse after supper, not so bad sleeping or when wakes up.  Seems better after nebulizer.  No excess mucus production. Cough has a harsh grinding/ barking quality assoc with sense of chest but not nasal congestion and sob mostly when coughing.   >>rx nexium and zantac , stopped dulera , tramadol for cough > better than in past 2 years   04/20/2011 Acute OV /NP  prod cough with thick yellow mucus, sneezing,- onset x 12h > reports has been battling a "traditional head cold" x4days.  Says symptoms started with tickle in throat, stuffy nose, aches, low grade fevers. "feels like  I have a cold" . Cough is different than it was before. No discolored mucus . CXR last ov with no acute process. No hemoptysis .  rec  Continue Nexium 40 mg Take 30-60 min before first meal of the day   Zantac 150 mg one at bedtime automatically  Continue singulair 10 mg one each pm  Mucinex DM Twice daily  As needed  Cough/congestion  ( or mucinex plus delsym)   Fluids and rest.   If you have a problem of coughing, take Tramadol 50 mg one every 4 hours as needed  If you have a trouble breathing use the nebulizer up to every 4 hours as needed   GERD (REFLUX) diet    04/23/2011 f/u ov/Valon Glasscock cc intermittently coughing up yelow mucus            Objective:   Physical Exam Elderly wf quite anxious,   nad  Wt  148 04/09/2011 >> 145 04/20/2011  > 146 04/23/2011  HEENT mild turbinate edema.  Oropharynx no thrush or excess pnd or cobblestoning.  No JVD or cervical adenopathy. Mild accessory muscle hypertrophy. Trachea midline, nl thryroid. Chest was hyperinflated by percussion with diminished breath sounds and moderate increased exp time without wheeze. Hoover sign positive at mid inspiration. Regular rate and rhythm without murmur gallop or rub or increase P2 or edema.  Abd: no hsm, nl excursion. Ext warm without cyanosis or clubbing.     cxr 04/23/2011 1. No evidence for acute cardiopulmonary abnormality.  2. Postoperative changes.  Labs 04/23/2011 nl cbc/ bmet      Assessment:         Plan:

## 2011-04-23 NOTE — Assessment & Plan Note (Signed)
All goals of chronic asthma control met including optimal function and elimination of symptoms with minimal need for rescue therapy.  Contingencies discussed in full including contacting this office immediately if not controlling the symptoms using the rule of two's.    

## 2011-04-23 NOTE — Assessment & Plan Note (Addendum)
Pulmonary f/u can be prn as this problem has resolved off ics and this is probably  Classic Upper airway cough syndrome, so named because it's frequently impossible to sort out how much is  CR/sinusitis with freq throat clearing (which can be related to primary GERD)   vs  causing  secondary (" extra esophageal")  GERD from wide swings in gastric pressure that occur with throat clearing, often  promoting self use of mint and menthol lozenges that reduce the lower esophageal sphincter tone and exacerbate the problem further in a cyclical fashion.   These are the same pts who not infrequently have failed to tolerate ace inhibitors,  dry powder inhalers or biphosphonates or report having reflux symptoms that don't respond to standard doses of PPI , and are easily confused as having aecopd or asthma flares.  See instructions for specific recommendations which were reviewed directly with the patient who was given a copy with highlighter outlining the key components.

## 2011-04-23 NOTE — Assessment & Plan Note (Signed)
Add bystolic 5 mg daily and referred back to Dr Alwyn Ren for longterm rx

## 2011-04-25 ENCOUNTER — Ambulatory Visit: Payer: Medicare Other | Admitting: Internal Medicine

## 2011-05-04 ENCOUNTER — Telehealth: Payer: Self-pay | Admitting: Internal Medicine

## 2011-05-04 NOTE — Telephone Encounter (Signed)
Pt states she cannot get her nexium RX x 3 weeks due to cost and is requesting samples to last until then . Samples at front. Pt aware. Carron Curie, CMA

## 2011-05-23 ENCOUNTER — Telehealth: Payer: Self-pay | Admitting: Internal Medicine

## 2011-05-23 ENCOUNTER — Ambulatory Visit (INDEPENDENT_AMBULATORY_CARE_PROVIDER_SITE_OTHER): Payer: Medicare Other | Admitting: Internal Medicine

## 2011-05-23 ENCOUNTER — Emergency Department (HOSPITAL_COMMUNITY)
Admission: EM | Admit: 2011-05-23 | Discharge: 2011-05-24 | Disposition: A | Discharge status: Home / Self Care | Payer: Medicare Other | Attending: Emergency Medicine | Admitting: Emergency Medicine

## 2011-05-23 ENCOUNTER — Emergency Department (HOSPITAL_COMMUNITY): Payer: Medicare Other

## 2011-05-23 VITALS — BP 174/90 | Temp 98.9°F | Wt 147.0 lb

## 2011-05-23 DIAGNOSIS — K219 Gastro-esophageal reflux disease without esophagitis: Secondary | ICD-10-CM | POA: Insufficient documentation

## 2011-05-23 DIAGNOSIS — R5383 Other fatigue: Secondary | ICD-10-CM | POA: Insufficient documentation

## 2011-05-23 DIAGNOSIS — M4120 Other idiopathic scoliosis, site unspecified: Secondary | ICD-10-CM | POA: Insufficient documentation

## 2011-05-23 DIAGNOSIS — R0789 Other chest pain: Secondary | ICD-10-CM | POA: Insufficient documentation

## 2011-05-23 DIAGNOSIS — R5381 Other malaise: Secondary | ICD-10-CM | POA: Insufficient documentation

## 2011-05-23 DIAGNOSIS — R002 Palpitations: Secondary | ICD-10-CM | POA: Insufficient documentation

## 2011-05-23 DIAGNOSIS — I10 Essential (primary) hypertension: Secondary | ICD-10-CM | POA: Insufficient documentation

## 2011-05-23 DIAGNOSIS — M542 Cervicalgia: Secondary | ICD-10-CM | POA: Insufficient documentation

## 2011-05-23 DIAGNOSIS — J45909 Unspecified asthma, uncomplicated: Secondary | ICD-10-CM | POA: Insufficient documentation

## 2011-05-23 LAB — POCT I-STAT, CHEM 8
BUN: 9 mg/dL (ref 6–23)
Calcium, Ion: 1.23 mmol/L (ref 1.12–1.32)
Chloride: 106 mEq/L (ref 96–112)
Creatinine, Ser: 0.8 mg/dL (ref 0.50–1.10)
Glucose, Bld: 95 mg/dL (ref 70–99)
HCT: 38 % (ref 36.0–46.0)
Hemoglobin: 12.9 g/dL (ref 12.0–15.0)
Potassium: 3.7 mEq/L (ref 3.5–5.1)
Sodium: 141 mEq/L (ref 135–145)
TCO2: 25 mmol/L (ref 0–100)

## 2011-05-23 LAB — POCT I-STAT TROPONIN I
Troponin i, poc: 0 ng/mL (ref 0.00–0.08)
Troponin i, poc: 0 ng/mL (ref 0.00–0.08)

## 2011-05-23 NOTE — Assessment & Plan Note (Addendum)
Patient presents today with on and off palpitations since 03/2011. Again the symptoms are vague. She is a nonsmoker, nondiabetic. BP is slightly elevated lately. She is allergic to aspirin and 8 other meds  Chart is reviewed. Admitted to the hospital 12-2009 with chest pain, saw cardiology, CT of the chest show no PE, she had a CT coronary angiogram with the score 0 (normal coronaries) Had a normal chest x-ray CBC and BMP 04-23-11   (platelets were slightly low)  I was about to recommend patient an outpatient cardiac evaluation, then she said that she was having pressure again. After further discussion we agreed to refer to the ER for further w/u, possibly consult cards and admit D/w Dr Juleen China

## 2011-05-23 NOTE — Progress Notes (Signed)
  Subjective:    Patient ID: Kayla [name], female    DOB: 08-04-1946, 65 y.o.   MRN: [id number]  HPI Chief complaint is palpitations. Patient is extremely vague about her symptoms but the best I can tell is as follows: Was at church 04-08-11 and suddenly felt something in the chest "a lot going on there", when asked more specifically she reports her heart was going rapidly and she felt pressure and maybe discomfort but no pain (sx encompassed the ant neck , no arm radiation). Symptoms were preceded by "a feeling in the legs", can't  be more specific. She was sent to the hospital that day:  records are reviewed, chest x-ray was negative, troponin was negative. She was released home. Since then she has been seen by pulmonary 3 times for cough, diagnosed with GERD.  She was noted to have elevated BP, they recommended byatolic , she has been taking it as needed only if her morning BP has been elevated. Since she saw pulmonary 04-23-11, she has only needed it one time.  Since 03/2011 she had other similar episodes of palpitations ---->  8-13, yesterday and today on-off .  Past Medical History  Diagnosis Date  . Asthma   . GERD (gastroesophageal reflux disease)     gastric polyp x3  . Osteopenia   . Fibromyalgia   . Glaucoma   . IBS (irritable bowel syndrome)   . Diverticulosis   . Pneumonia   . UTI (lower urinary tract infection)   . Diverticulitis      Review of Systems  Denies shortness of breath, no consistent nausea or diaphoresis with episodes. No recent airplane trip, no lower extremity edema or actual pain Denies feeling anxious except when she has the episodes    Objective:   Physical Exam  Constitutional: She appears well-developed and well-nourished.  HENT:  Head: Normocephalic and atraumatic.  Neck:       There is no swelling, thyroid is nonpalpable, the whole anterior area of the neck is slightly tender to palpation without redness or swelling.  Cardiovascular: Normal  rate, regular rhythm and normal heart sounds.   No murmur heard. Pulmonary/Chest: Effort normal and breath sounds normal. No respiratory distress. She has no wheezes. She has no rales.  Abdominal:       Of, nondistended, mild epigastric tenderness.   Musculoskeletal: She exhibits no edema.       calves symmetric and nontender  Psychiatric:       Moderately anxious appearing      Assessment & Plan:  Today , I spent more than 30  min with the patient, >50% of the time  reviewing the chart and coordinating her care

## 2011-05-23 NOTE — Telephone Encounter (Signed)
Dr Frederik Pear patient called to talk about her BP----saw Dr Sherene Sires weeks ago who gave her med to take if BP was high each day when she took her BP---says it has been OK for a while, but today it has been running 168/88, 172/99---she has taken 4 baby aspirin and the BP med but is very anxious about these numbers---made appt for today with Dr Drue Novel at 3:45 instead of seeing Dr Alwyn Ren tomorrow--please call her to advise her about these numbers and whether she should see Dr Drue Novel today or wait for Dr Alwyn Ren

## 2011-05-23 NOTE — Telephone Encounter (Signed)
Patient is Not experiencing any faintness, lightheadedness, pain in chest and/or arm, blurred vision and/or slurred speech- Patient is exhibiting symptoms of Anxiety in tone over the phone. Informed patient to use an outlet that seems to be calming for her; Pt states she will read her Bible scriptures. Informed her that walking and using deep breathing techniques [in through nose, out through mouth] could also be helpful; stated she would try this as well. Patient will keep appointment today with Dr Drue Novel and call me back if she needs to talk before then.

## 2011-05-24 ENCOUNTER — Encounter: Payer: Self-pay | Admitting: Cardiology

## 2011-05-24 ENCOUNTER — Ambulatory Visit (INDEPENDENT_AMBULATORY_CARE_PROVIDER_SITE_OTHER): Payer: Medicare Other | Admitting: Cardiology

## 2011-05-24 ENCOUNTER — Telehealth: Payer: Self-pay | Admitting: *Deleted

## 2011-05-24 DIAGNOSIS — R002 Palpitations: Secondary | ICD-10-CM

## 2011-05-24 DIAGNOSIS — I10 Essential (primary) hypertension: Secondary | ICD-10-CM

## 2011-05-24 DIAGNOSIS — R079 Chest pain, unspecified: Secondary | ICD-10-CM

## 2011-05-24 LAB — CBC
HCT: 38.1 % (ref 36.0–46.0)
Hemoglobin: 13 g/dL (ref 12.0–15.0)
MCH: 30.2 pg (ref 26.0–34.0)
MCHC: 34.1 g/dL (ref 30.0–36.0)
MCV: 88.4 fL (ref 78.0–100.0)
Platelets: 154 10*3/uL (ref 150–400)
RBC: 4.31 MIL/uL (ref 3.87–5.11)
RDW: 13.5 % (ref 11.5–15.5)
WBC: 4.2 10*3/uL (ref 4.0–10.5)

## 2011-05-24 MED ORDER — METOPROLOL TARTRATE 25 MG PO TABS: ORAL_TABLET | ORAL | Status: DC

## 2011-05-24 NOTE — Assessment & Plan Note (Signed)
I do not suspect angina.  No further invasive cardiac work up is indicated.

## 2011-05-24 NOTE — Assessment & Plan Note (Signed)
She will wear a 21 day event monitor.  I will change her to metoprolol 25 b.i.d. We discussed (at great length) additional p.r.n. beta blocker dosing.  Further treatment will be based on the results of the event recorder.  I did discuss with her the possibility of panic as an etiology for her complaints.

## 2011-05-24 NOTE — Telephone Encounter (Signed)
FYI: Patient called to inform us that she did go to ED yesterday and has appointment with Dr Antoine Poche this afternoon.

## 2011-05-24 NOTE — Assessment & Plan Note (Signed)
I will change the beta blocker as described.  In addition she will keep a BP diary.

## 2011-05-24 NOTE — Patient Instructions (Signed)
Your physician has recommended that you wear an event monitor for 21 days. Event monitors are medical devices that record the heart's electrical activity. Doctors most often [place] these monitors to diagnose arrhythmias. Arrhythmias are problems with the speed or rhythm of the heartbeat. The monitor is a small, portable device. You can wear one while you do your normal daily activities. This is usually used to diagnose what is causing palpitations/syncope (passing out).  Please stop your Bystolic and start Metoprolol 25 mg twice a day.  You may take one tablet extra daily as needed.  Follow up with Dr Antoine Poche in 6 weeks.

## 2011-05-24 NOTE — Progress Notes (Signed)
HPI Patient presents for evaluation of chest discomfort. She has a history of chest pain with normal coronaries on CT angiography last year. She reports that in August she had an episode of sudden tingling and burning in her legs profound weakness and presyncope though she didn't lose consciousness. She was at church. She felt trembly. She was noted to have a blood pressure systolic of 190 which she has had episodically. She was seen by Dr. Sherene Sires for management of possible asthma recently but was told she probably had reflux. Because of her symptoms as described above she was started on Bystolic.  However, she continues to get symptoms happening sporadically. Had episodes of wake her from her sleep. Her blood pressure might be up. Her heart might be racing. She might get some chest discomfort similar to what she had last year. She was in a week. She might have presyncope but she has not had syncope. She is unable to bring on the symptoms. She can be active without chest pressure, neck or arm discomfort. She's not describing PND or orthopnea.  Allergies  Allergen Reactions  . Aspirin     REACTION: stomach pain  . Belladonna   . Cephalosporins     blister  . Ciprofloxacin Hcl   . Conjugated Estrogens   . Gabapentin     REACTION: ras  . Levaquin   . Levofloxacin     REACTION: stomach ache and rash  . Nabumetone   . Nsaids   . Prednisone   . Psyllium     REACTION: rash  . Sulfonamide Derivatives     REACTION: shock    Current Outpatient Prescriptions  Medication Sig Dispense Refill  . acetaminophen (TYLENOL EX ST ARTHRITIS PAIN) 500 MG tablet Take 500 mg by mouth daily. Take 2 tabs once       . albuterol (PROAIR HFA) 108 (90 BASE) MCG/ACT inhaler Inhale 2 puffs into the lungs every 6 (six) hours as needed.  1 Inhaler  2  . albuterol (PROVENTIL) (2.5 MG/3ML) 0.083% nebulizer solution Take 3 mLs (2.5 mg total) by nebulization every 6 (six) hours as needed for wheezing.  75 mL  1  . Calcium  Citrate 250 MG TABS Take 250 mg by mouth daily.        . Calcium-Magnesium (CAL/MAG CITRATE) 250-125 MG TABS Take 250 mg by mouth daily.        . cholecalciferol (VITAMIN D) 1000 UNITS tablet Take 1,000 Units by mouth daily.       Marland Kitchen esomeprazole (NEXIUM) 40 MG capsule Take 1 capsule (40 mg total) by mouth daily.  30 capsule  2  . fluticasone (FLONASE) 50 MCG/ACT nasal spray 2 sprays by Nasal route daily.  16 g  6  . GuaiFENesin (MUCINEX PO) Take by mouth as needed.       . hyoscyamine (LEVSIN SL) 0.125 MG SL tablet Place 0.125 mg under the tongue every 6 (six) hours as needed. 1 under tongue every 6 hrs prn for abdominal pain       . latanoprost (XALATAN) 0.005 % ophthalmic solution Place 1 drop into both eyes at bedtime.        Marland Kitchen loratadine (CLARITIN) 10 MG tablet Take 10 mg by mouth daily.        . montelukast (SINGULAIR) 10 MG tablet Take 10 mg by mouth at bedtime.       . nebivolol (BYSTOLIC) 5 MG tablet Take 1 tablet (5 mg total) by mouth daily.  30 tablet    .  PROBIOTIC CAPS Take by mouth daily.        . ranitidine (ZANTAC 75) 75 MG tablet 2 at bedtime      . traMADol (ULTRAM) 50 MG tablet Take 50 mg by mouth every 6 (six) hours as needed.          Past Medical History  Diagnosis Date  . Asthma   . GERD (gastroesophageal reflux disease)     gastric polyp x3  . Osteopenia   . Fibromyalgia   . Glaucoma   . IBS (irritable bowel syndrome)   . Diverticulosis   . Pneumonia   . UTI (lower urinary tract infection)   . Diverticulitis     Past Surgical History  Procedure Date  . Spine surgery     T9-L5 fusions  . Colonoscopy w/ polypectomy 1998    Gastric polyp last 2007    ROS:  Otherwise as stated in the HPI and negative for all other systems.  PHYSICAL EXAM BP 144/73  Pulse 62  Ht 5\' 4"  (1.626 m)  Wt 147 lb (66.679 kg)  BMI 25.23 kg/m2 GENERAL:  Well appearing HEENT:  Pupils equal round and reactive, fundi not visualized, oral mucosa unremarkable NECK:  No jugular  venous distention, waveform within normal limits, carotid upstroke brisk and symmetric, no bruits, no thyromegaly LYMPHATICS:  No cervical, inguinal adenopathy LUNGS:  Clear to auscultation bilaterally BACK:  No CVA tenderness CHEST:  Unremarkable HEART:  PMI not displaced or sustained,S1 and S2 within normal limits, no S3, no S4, no clicks, no rubs, no murmurs ABD:  Flat, positive bowel sounds normal in frequency in pitch, no bruits, no rebound, no guarding, no midline pulsatile mass, no hepatomegaly, no splenomegaly EXT:  2 plus pulses throughout, no edema, no cyanosis no clubbing SKIN:  No rashes no nodules NEURO:  Cranial nerves II through XII grossly intact, motor grossly intact throughout PSYCH:  Cognitively intact, oriented to person place and time  ASSESSMENT AND PLAN

## 2011-05-25 ENCOUNTER — Other Ambulatory Visit: Payer: Self-pay | Admitting: Internal Medicine

## 2011-05-28 ENCOUNTER — Telehealth: Payer: Self-pay | Admitting: Internal Medicine

## 2011-05-28 ENCOUNTER — Ambulatory Visit (INDEPENDENT_AMBULATORY_CARE_PROVIDER_SITE_OTHER): Payer: Medicare Other | Admitting: Adult Health

## 2011-05-28 DIAGNOSIS — J019 Acute sinusitis, unspecified: Secondary | ICD-10-CM

## 2011-05-28 MED ORDER — EPINEPHRINE 0.3 MG/0.3ML IJ DEVI: 0.3000 mg | Freq: Once | INTRAMUSCULAR | Status: DC

## 2011-05-28 MED ORDER — AMOXICILLIN-POT CLAVULANATE 875-125 MG PO TABS: 1.0000 | ORAL_TABLET | Freq: Two times a day (BID) | ORAL | Status: DC

## 2011-05-28 NOTE — Telephone Encounter (Signed)
Pt sched to see TP at 11:45 am today

## 2011-05-28 NOTE — Telephone Encounter (Signed)
LMTCB

## 2011-05-28 NOTE — Patient Instructions (Signed)
Augmentin 875mg  Twice daily  For 14 days -take with food, eat yogurt.    Mucinex DM Twice daily  As needed  Cough/congestion     Saline nasal rinses As needed    Fluids and rest. Tylenol As needed  For fever.    Tramadol 50 mg 1-2  every 4 hours as needed for breakthrough cough.   If you have a trouble breathing use the nebulizer up to every 4 hours as needed    Please contact office for sooner follow up if symptoms do not improve or worsen or seek emergency care    follow up Dr. Sherene Sires  In 2 weeks

## 2011-05-28 NOTE — Assessment & Plan Note (Addendum)
Acute sinusitis with associated recurrent asthmatic bronchitic symptoms Will tx for total of 2 weeks of Augmentin.  If continues to have recurrence will need CT scan of sinus.   Plan:  Augmentin 875mg  Twice daily  For 14 days -take with food, eat yogurt.    Mucinex DM Twice daily  As needed  Cough/congestion     Saline nasal rinses As needed    Fluids and rest. Tylenol As needed  For fever.    Tramadol 50 mg 1-2  every 4 hours as needed for breakthrough cough.   If you have a trouble breathing use the nebulizer up to every 4 hours as needed    Please contact office for sooner follow up if symptoms do not improve or worsen or seek emergency care    follow up Dr. Sherene Sires  In 2 weeks

## 2011-05-28 NOTE — Progress Notes (Signed)
Subjective:     Patient ID: Kayla [name], female   DOB: 01-Jul-1946, 65 y.o.   MRN: [id number]  HPI  64yowf  quit smoking 1976 with tendency to" throat infections" that improved then some worse in mid 90's attributed to environment in [place] better after returning to [place] until around 2009/2010 and since then chronically symptomatic requiring maint rx per Dr Alwyn Ren    04/09/2011 Initial pulmonary office eval in EMR era p trip to ER with chest discomfort attributed to asthma attack despite maint rx with dulera 200/ singulair/  Daily symptoms x sev months feels needs to cough but takes a lot of effort,  Cough worse after supper, not so bad sleeping or when wakes up.  Seems better after nebulizer.  No excess mucus production. Cough has a harsh grinding/ barking quality assoc with sense of chest but not nasal congestion and sob mostly when coughing.   >>rx nexium and zantac , stopped dulera , tramadol for cough > better than in past 2 years   04/20/2011 Acute OV /NP  prod cough with thick yellow mucus, sneezing,- onset x 12h > reports has been battling a "traditional head cold" x4days.  Says symptoms started with tickle in throat, stuffy nose, aches, low grade fevers. "feels like  I have a cold" . Cough is different than it was before. No discolored mucus . CXR last ov with no acute process. No hemoptysis . >>PPI , zantac, tramadol for cough control.    04/23/2011 f/u ov/Wert cc intermittently coughing up yelow mucus >>bystolic added.   05/28/2011 Acute OV Complains of Sinus drainage  for 3 days - Started neti pot - Prod cough started yesterday  (Clear to white with some light yellow) - Temp 101 last night. Feels terrible. Mucinex and Tramadol without much relief. Improved to normal after last visit until last 3 days. Has a lot of sinus congestion, pressure and drainage.  Has fever, feels weak.   Had another episode of elevated b/p w/ ER visit. Referred to Cardiology , changed from Bystolic to  Metoprolol. B/p has been improved .             Objective:   Physical Exam Elderly wf NAD  Wt  148 04/09/2011 >> 145 04/20/2011  > 146 04/23/2011 >>148 05/28/2011  HEENT mild turbinate edema. Max sinus tenderness,  Oropharynx no thrush or excess pnd or cobblestoning.  No JVD or cervical adenopathy. Mild accessory muscle hypertrophy. Trachea midline, nl thryroid. Chest was hyperinflated by percussion with diminished breath sounds and moderate increased exp time without wheeze. Hoover sign positive at mid inspiration. Regular rate and rhythm without murmur gallop or rub or increase P2 or edema.  Abd: no hsm, nl excursion. Ext warm without cyanosis or clubbing.     cxr 04/23/2011 1. No evidence for acute cardiopulmonary abnormality.  2. Postoperative changes.  Labs 04/23/2011 nl cbc/ bmet      Assessment:         Plan:

## 2011-05-28 NOTE — Telephone Encounter (Signed)
Was doing great now  Sick x 2 days with more of a congested cough with white mucus and temp of 101 this evening She has multiple drug allergies so rec she use tylenol tonight but come in 05/28/2011 for ov with Tammy NP or one of the pulmonary docs

## 2011-05-30 ENCOUNTER — Telehealth: Payer: Self-pay | Admitting: Pulmonary Disease

## 2011-05-30 NOTE — Telephone Encounter (Signed)
Took nebuliser for wheezing, BP up now better Allergy to multiple meds incl prednisone noted Recomm - restart dulera bid until wheezing gone(she has MDI) Take albuterol q 4-6 h prn until wheezing gone Call for appt if no better in 24-48h

## 2011-06-08 ENCOUNTER — Encounter: Payer: Self-pay | Admitting: Pulmonary Disease

## 2011-06-08 ENCOUNTER — Ambulatory Visit (INDEPENDENT_AMBULATORY_CARE_PROVIDER_SITE_OTHER): Payer: Medicare Other | Admitting: Pulmonary Disease

## 2011-06-08 ENCOUNTER — Telehealth: Payer: Self-pay | Admitting: Pulmonary Disease

## 2011-06-08 ENCOUNTER — Telehealth: Payer: Self-pay | Admitting: Internal Medicine

## 2011-06-08 ENCOUNTER — Other Ambulatory Visit: Payer: Self-pay | Admitting: Internal Medicine

## 2011-06-08 VITALS — BP 142/72 | HR 66 | Temp 98.4°F | Ht 64.0 in | Wt 148.6 lb

## 2011-06-08 DIAGNOSIS — R05 Cough: Secondary | ICD-10-CM

## 2011-06-08 MED ORDER — TRAMADOL HCL 50 MG PO TABS: 50.0000 mg | ORAL_TABLET | Freq: Four times a day (QID) | ORAL | Status: DC | PRN

## 2011-06-08 MED ORDER — METHYLPREDNISOLONE 16 MG PO TABS: ORAL_TABLET | ORAL | Status: DC

## 2011-06-08 NOTE — Telephone Encounter (Signed)
Per KC, ok to fill tramadol for q6h prn # 40 tablets no refills.    Called, spoke with pt.  She is aware rx for tramadol will be sent to pharmacy.  Nothing further needed at this time.

## 2011-06-08 NOTE — Assessment & Plan Note (Signed)
It is very difficult to isolate whether her cough currently is coming from her upper or lower airway.  She does have a lot of rattling in her chest, but also has an upper airway collection of mucus.  She has normal spirometry today.  I do not think she needs further antibiotics, but she may benefit from a short course of steroids to help with respiratory tract inflammation.  The patient states she has an "allergy" to prednisone in the form of a rash, but has never tried Medrol.  I have also asked her to try Mucinex to help with mucociliary clearance.  If she does not improve over the next one to 2 weeks, she may need a scan of her sinuses to rule out chronic sinusitis.

## 2011-06-08 NOTE — Telephone Encounter (Signed)
Called, spoke with pt.  She was seen by Tp on 05/28/11 and was given a 14 day coarse of abx.  She called in on 05/30/11, spoke with RA - on call dr - who told her to restart dulera until wheezing was gone.  Pt states she took this for 2-3 days with relief, but over the past couple of days wheezing has started back.  It worsened last night with only a little relief from neb.  Also, states she is still having a little drainage and prod cough with mostly white mucus with a tint of yellow at times.  States she did notice a tiny bit of brownish mucus once.  She is taking mucinex prn but not everyday.  Will finish abx Monday.  Taking Tramadol if "coughing a lot."  Thinks she needs something for the wheezing - requesting recs.  Advised MW is out of office until Monday - OV advised and was scheduled for today at 9:45am with KC.  Pt aware.

## 2011-06-08 NOTE — Progress Notes (Signed)
Addended by: Salli Quarry on: 06/08/2011 11:07 AM   Modules accepted: Orders

## 2011-06-08 NOTE — Progress Notes (Signed)
  Subjective:    Patient ID: Kayla [name], female    DOB: [birth year], 65 y.o.   MRN: [id number]  HPI The patient comes in today for an acute sick visit.  She has questionable asthma, as well as chronic cough.  She has been maintained on Singulair alone, and has avoided inhaled corticosteroids because of upper airway dysfunction.  She was recently seen by our nurse Tichenor for what sounds like an acute sinusitis.  She was treated with 2 weeks of antibiotics, and also nasal hygiene.  The patient comes in today where she is still having some sinus pressure, but much improved.  She is blowing and non-purulent mucus from her nose.  She is having increased cough with large quantities of nonpurulent mucus, and feels this is coming from her chest.  She is having rattling in her chest, and describes the feeling of wheezing rather than audible wheezing.  She has some increased shortness of breath.   Review of Systems  Constitutional: Positive for diaphoresis. Negative for fever and unexpected weight change.  HENT: Positive for congestion, sneezing and postnasal drip. Negative for ear pain, nosebleeds, sore throat, rhinorrhea, trouble swallowing, dental problem and sinus pressure.   Eyes: Negative for redness and itching.  Respiratory: Positive for cough, chest tightness and wheezing. Negative for shortness of breath.   Cardiovascular: Negative for palpitations and leg swelling.  Gastrointestinal: Negative for nausea and vomiting.  Genitourinary: Negative for dysuria.  Musculoskeletal: Negative for joint swelling.  Skin: Negative for rash.  Neurological: Negative for headaches.  Hematological: Does not bruise/bleed easily.  Psychiatric/Behavioral: Negative for dysphoric mood. The patient is not nervous/anxious.        Objective:   Physical Exam Overweight female in no acute distress Nose with very erythematous mucosa, but no purulence or discharge. Oropharynx clear without exudates Neck without  lymphadenopathy Chest with significant rattling that partially clears with cough, definite rhonchi, and a lot of upper airway noise which is transmitted to the lower airways.  No true wheezing noted Cardiac with regular rate and rhythm Lower extremities without edema, no cyanosis Alert and oriented, moves all 4 extremities.       Assessment & Plan:

## 2011-06-08 NOTE — Patient Instructions (Signed)
Will treat with a course of medrol to help with respiratory tract inflammation.  Will hold off on further antibiotics since it appears the infectious component is resolving. Mucinex dm extra strength one in am and pm for next few weeks. If you continue to have symptoms, will need a scan of your sinuses to exclude chronic sinusitis. followup with Dr. Sherene Sires in 2 weeks.

## 2011-06-08 NOTE — Telephone Encounter (Signed)
See phone note dated 06/08/11

## 2011-06-08 NOTE — Telephone Encounter (Signed)
Please advise if okay for her to have this refilled- she was seen by [facility] today and told take mucinex dm max for cough, thanks

## 2011-06-11 ENCOUNTER — Encounter (INDEPENDENT_AMBULATORY_CARE_PROVIDER_SITE_OTHER): Payer: Medicare Other

## 2011-06-11 DIAGNOSIS — R002 Palpitations: Secondary | ICD-10-CM

## 2011-06-18 ENCOUNTER — Ambulatory Visit (INDEPENDENT_AMBULATORY_CARE_PROVIDER_SITE_OTHER): Payer: Medicare Other | Admitting: Family Medicine

## 2011-06-18 ENCOUNTER — Other Ambulatory Visit: Payer: Self-pay | Admitting: Family Medicine

## 2011-06-18 ENCOUNTER — Encounter: Payer: Self-pay | Admitting: Family Medicine

## 2011-06-18 VITALS — BP 120/82 | Temp 99.3°F | Wt 148.0 lb

## 2011-06-18 DIAGNOSIS — N390 Urinary tract infection, site not specified: Secondary | ICD-10-CM

## 2011-06-18 LAB — POCT URINALYSIS DIPSTICK
Ketones, UA: NEGATIVE
Protein, UA: NEGATIVE
Spec Grav, UA: 1.005
pH, UA: 8

## 2011-06-18 MED ORDER — NITROFURANTOIN MONOHYD MACRO 100 MG PO CAPS: 100.0000 mg | ORAL_CAPSULE | Freq: Two times a day (BID) | ORAL | Status: AC

## 2011-06-18 NOTE — Progress Notes (Signed)
  Subjective:    Patient ID: Kayla [name], female    DOB: [birth year], 65 y.o.   MRN: [id number]  HPI ? UTI- sxs started Friday w/ pelvic pressure, took AZO on Saturday w/ some relief.  Having some associated LBP.  Denies increased frequency.  + dysuria.  No hematuria.  No fevers.  Feels similar to previous infxns.   Review of Systems For ROS see HPI     Objective:   Physical Exam  Vitals reviewed. Constitutional: She appears well-developed and well-nourished. No distress.  Abdominal: Soft. Bowel sounds are normal. She exhibits no distension. There is tenderness (mild suprapubic pressure, no CVA tenderness).          Assessment & Plan:

## 2011-06-18 NOTE — Patient Instructions (Signed)
Take the Macrobid twice daily for the urinary tract infection- take w/ food Drink LOTS of fluids Call if symptoms change or worsen Hang in there!

## 2011-06-19 ENCOUNTER — Ambulatory Visit: Payer: Medicare Other | Admitting: Internal Medicine

## 2011-06-21 ENCOUNTER — Telehealth: Payer: Self-pay

## 2011-06-21 ENCOUNTER — Telehealth: Payer: Self-pay | Admitting: *Deleted

## 2011-06-21 DIAGNOSIS — N390 Urinary tract infection, site not specified: Secondary | ICD-10-CM

## 2011-06-21 NOTE — Telephone Encounter (Signed)
Pt can continue AZO and fluids.  We are calling for her uro appt- hopefully we can get a sooner appt than next week.

## 2011-06-21 NOTE — Telephone Encounter (Signed)
Pt would like to know if it ok for her to continue taking the AZO and drinking plenty of fluid until she is able to get appt or if there is any else she can do to help with the pain from UTI. [facility] and the earliest appt will not be until next week possibly.  Please advise

## 2011-06-21 NOTE — Telephone Encounter (Signed)
Discuss with patient  

## 2011-06-21 NOTE — Telephone Encounter (Signed)
Pt states that she is feeling better. She is drinking plenty of fluids. She states that Keflex caused her to have blisters in her mouth and severe GI issues. She said that Dr. Alwyn Ren told her if she has another UTI, then she would need to see urology.  Urology referral placed

## 2011-06-21 NOTE — Telephone Encounter (Signed)
Message copied by Beverely Low on Thu Jun 21, 2011 11:56 AM ------      Message from: Sheliah Hatch      Created: Thu Jun 21, 2011  7:56 AM       Pt w/ UTI but it is resistant to the Macrobid she is on and the Ampicillin that she is able to take.  Everything it is sensitive to, she is allergic to.  Please ask what her reaction was to Keflex (according to her allergy list this is the least problematic for her)

## 2011-06-21 NOTE — Telephone Encounter (Signed)
Pt aware that urology referral was placed.

## 2011-06-22 NOTE — Assessment & Plan Note (Signed)
Pt's sxs and UA consistent w/ infxn.  Given multiple allergies, will start Macrobid and await cx results.  Encouraged increased fluids.  Reviewed supportive care and red flags that should prompt return.  Pt expressed understanding and is in agreement w/ plan.

## 2011-06-28 ENCOUNTER — Ambulatory Visit: Payer: Medicare Other | Admitting: Internal Medicine

## 2011-07-02 ENCOUNTER — Telehealth: Payer: Self-pay | Admitting: Internal Medicine

## 2011-07-03 ENCOUNTER — Encounter: Payer: Self-pay | Admitting: Cardiology

## 2011-07-03 ENCOUNTER — Ambulatory Visit (INDEPENDENT_AMBULATORY_CARE_PROVIDER_SITE_OTHER): Payer: Medicare Other | Admitting: Cardiology

## 2011-07-03 DIAGNOSIS — R079 Chest pain, unspecified: Secondary | ICD-10-CM

## 2011-07-03 DIAGNOSIS — R002 Palpitations: Secondary | ICD-10-CM

## 2011-07-03 MED ORDER — ESOMEPRAZOLE MAGNESIUM 40 MG PO CPDR: 40.0000 mg | DELAYED_RELEASE_CAPSULE | Freq: Every day | ORAL | Status: DC

## 2011-07-03 NOTE — Patient Instructions (Signed)
Follow up as needed

## 2011-07-03 NOTE — Telephone Encounter (Signed)
Samples of Nexium put at the front counter for patient to pick up.

## 2011-07-03 NOTE — Assessment & Plan Note (Signed)
She has no further presyncope and no palpitations.  Event monitor was OK.  No change in therapy or further evaluation was needed.

## 2011-07-03 NOTE — Assessment & Plan Note (Signed)
She had normal coronaries on CT in the past.  She has no ongoing symptoms.  No change in therapy is indicated.  No further testing is indicated.

## 2011-07-03 NOTE — Progress Notes (Signed)
HPI The patient presents for evaluation of palpitations.  Since I last saw her she has been doing well. She denies any chest pain, neck or arm discomfort. She denies any palpitations, presyncope or syncope. She wore a 21 day event monitor and she had no palpitations with this.  The patient denies any new symptoms such as chest discomfort, neck or arm discomfort. There has been no new shortness of breath, PND or orthopnea. There have been no reported  presyncope or syncope.  Allergies  Allergen Reactions  . Aspirin     REACTION: stomach pain  . Belladonna   . Cephalosporins     blister  . Ciprofloxacin Hcl   . Conjugated Estrogens   . Gabapentin     REACTION: ras  . Levaquin   . Levofloxacin     REACTION: stomach ache and rash  . Nabumetone   . Nsaids   . Prednisone     NO PROBLEM WITH MEDROL DOSE PAK  . Psyllium     REACTION: rash  . Sulfonamide Derivatives     REACTION: shock    Current Outpatient Prescriptions  Medication Sig Dispense Refill  . acetaminophen (TYLENOL EX ST ARTHRITIS PAIN) 500 MG tablet Take 500 mg by mouth as needed.       Marland Kitchen albuterol (PROAIR HFA) 108 (90 BASE) MCG/ACT inhaler Inhale 2 puffs into the lungs every 6 (six) hours as needed.  1 Inhaler  2  . albuterol (PROVENTIL) (2.5 MG/3ML) 0.083% nebulizer solution Take 3 mLs (2.5 mg total) by nebulization every 6 (six) hours as needed for wheezing.  75 mL  1  . Calcium-Magnesium (CAL/MAG CITRATE) 250-125 MG TABS Take 250 mg by mouth daily.        . cholecalciferol (VITAMIN D) 1000 UNITS tablet Take 1,000 Units by mouth daily.       Marland Kitchen EPINEPHrine (EPI-PEN) 0.3 mg/0.3 mL DEVI Inject 0.3 mLs (0.3 mg total) into the muscle once.  1 Device  0  . esomeprazole (NEXIUM) 40 MG capsule Take 1 capsule (40 mg total) by mouth daily.  30 capsule  0  . fluticasone (FLONASE) 50 MCG/ACT nasal spray 2 sprays by Nasal route daily.  16 g  6  . GuaiFENesin (MUCINEX PO) Take by mouth as needed.       . hyoscyamine (LEVSIN SL) 0.125  MG SL tablet Place 0.125 mg under the tongue every 6 (six) hours as needed. 1 under tongue every 6 hrs prn for abdominal pain       . latanoprost (XALATAN) 0.005 % ophthalmic solution Place 1 drop into both eyes at bedtime.        . metoprolol tartrate (LOPRESSOR) 25 MG tablet Take 25 mg by mouth 2 (two) times daily. Tak  1 extra tablet as needed       . PROBIOTIC CAPS Take by mouth daily.        . ranitidine (ZANTAC) 150 MG tablet Take 150 mg by mouth at bedtime.        Marland Kitchen SINGULAIR 10 MG tablet TAKE ONE TABLET BY MOUTH EVERY DAY  30 each  11  . traMADol (ULTRAM) 50 MG tablet Take 1 tablet (50 mg total) by mouth every 6 (six) hours as needed.  40 tablet  0    Past Medical History  Diagnosis Date  . Asthma   . GERD (gastroesophageal reflux disease)     gastric polyp x3  . Osteopenia   . Fibromyalgia   . Glaucoma   .  IBS (irritable bowel syndrome)   . Diverticulosis   . Pneumonia   . UTI (lower urinary tract infection)   . Diverticulitis     Past Surgical History  Procedure Date  . Spine surgery     T9-L5 fusions  . Colonoscopy w/ polypectomy 1998    Gastric polyp last 2007    ROS:  Otherwise as stated in the HPI and negative for all other systems.  PHYSICAL EXAM BP 154/75  Pulse 56  Resp 18  Ht 5\' 4"  (1.626 m)  Wt 147 lb 12.8 oz (67.042 kg)  BMI 25.37 kg/m2 GENERAL:  Well appearing NECK:  No jugular venous distention, waveform within normal limits, carotid upstroke brisk and symmetric, no bruits, no thyromegaly LYMPHATICS:  No cervical, inguinal adenopathy LUNGS:  Clear to auscultation bilaterally BACK:  No CVA tenderness HEART:  PMI not displaced or sustained,S1 and S2 within normal limits, no S3, no S4, no clicks, no rubs, no murmurs ABD:  Flat, positive bowel sounds normal in frequency in pitch, no bruits, no rebound, no guarding, no midline pulsatile mass, no hepatomegaly, no splenomegaly EXT:  2 plus pulses throughout, no edema, no cyanosis no  clubbing   ASSESSMENT AND PLAN

## 2011-07-05 ENCOUNTER — Ambulatory Visit (INDEPENDENT_AMBULATORY_CARE_PROVIDER_SITE_OTHER)
Admission: RE | Admit: 2011-07-05 | Discharge: 2011-07-05 | Disposition: A | Discharge status: Home / Self Care | Payer: Medicare Other | Source: Ambulatory Visit | Attending: Cardiovascular Disease | Admitting: Cardiovascular Disease

## 2011-07-05 ENCOUNTER — Ambulatory Visit (INDEPENDENT_AMBULATORY_CARE_PROVIDER_SITE_OTHER): Payer: Medicare Other | Admitting: Internal Medicine

## 2011-07-05 ENCOUNTER — Encounter: Payer: Self-pay | Admitting: Internal Medicine

## 2011-07-05 ENCOUNTER — Other Ambulatory Visit (INDEPENDENT_AMBULATORY_CARE_PROVIDER_SITE_OTHER): Payer: Medicare Other

## 2011-07-05 VITALS — BP 122/64 | HR 60 | Temp 98.3°F | Ht 64.0 in | Wt 150.0 lb

## 2011-07-05 DIAGNOSIS — R05 Cough: Secondary | ICD-10-CM

## 2011-07-05 LAB — CBC WITH DIFFERENTIAL/PLATELET
Basophils Relative: 0.4 % (ref 0.0–3.0)
Eosinophils Relative: 1.2 % (ref 0.0–5.0)
HCT: 37.7 % (ref 36.0–46.0)
Lymphs Abs: 0.8 10*3/uL (ref 0.7–4.0)
MCV: 90.4 fl (ref 78.0–100.0)
Monocytes Absolute: 0.5 10*3/uL (ref 0.1–1.0)
Monocytes Relative: 8.9 % (ref 3.0–12.0)
Neutrophils Relative %: 74.4 % (ref 43.0–77.0)
RBC: 4.17 Mil/uL (ref 3.87–5.11)
WBC: 5.2 10*3/uL (ref 4.5–10.5)

## 2011-07-05 NOTE — Patient Instructions (Signed)
Please see patient coordinator before you leave today  to schedule sinus ct  GERD (REFLUX)  is an extremely common cause of respiratory symptoms, many times with no significant heartburn at all.    It can be treated with medication, but also with lifestyle changes including avoidance of late meals, excessive alcohol, smoking cessation, and avoid fatty foods, chocolate, peppermint, colas, red wine, and acidic juices such as orange juice.  NO MINT OR MENTHOL PRODUCTS SO NO COUGH DROPS  USE SUGARLESS CANDY INSTEAD (jolley ranchers or Stover's)  NO OIL BASED VITAMINS - use powdered substitutes.    Please remember to go to the lab  department downstairs for your tests - we will call you with the results when then are available.   Please schedule a follow up office visit in 4 weeks, sooner if needed

## 2011-07-05 NOTE — Progress Notes (Signed)
Quick Note:  Spoke with pt and notified of results per Dr. Wert. Pt verbalized understanding and denied any questions.  ______ 

## 2011-07-05 NOTE — Assessment & Plan Note (Signed)
The most common causes of chronic cough in immunocompetent adults include the following: upper airway cough syndrome (UACS), previously referred to as postnasal drip syndrome (PNDS), which is caused by variety of rhinosinus conditions; (2) asthma; (3) GERD; (4) chronic bronchitis from cigarette smoking or other inhaled environmental irritants; (5) nonasthmatic eosinophilic bronchitis; and (6) bronchiectasis.   These conditions, singly or in combination, have accounted for up to 94% of the causes of chronic cough in prospective studies.   Other conditions have constituted no >6% of the causes in prospective studies These have included bronchogenic carcinoma, chronic interstitial pneumonia, sarcoidosis, left ventricular failure, ACEI-induced cough, and aspiration from a condition associated with pharyngeal dysfunction.   This is most c/w  Classic Upper airway cough syndrome, so named because it's frequently impossible to sort out how much is  CR/sinusitis with freq throat clearing (which can be related to primary GERD)   vs  causing  secondary (" extra esophageal")  GERD from wide swings in gastric pressure that occur with throat clearing, often  promoting self use of mint and menthol lozenges that reduce the lower esophageal sphincter tone and exacerbate the problem further in a cyclical fashion.   These are the same pts who not infrequently have failed to tolerate ace inhibitors,  dry powder inhalers or biphosphonates or report having reflux symptoms that don't respond to standard doses of PPI , and are easily confused as having aecopd or asthma flares,   Next step is proceed with rhinitis w/u since still clearing throat on max gerd rx.  See instructions for specific recommendations which were reviewed directly with the patient who was given a copy with highlighter outlining the key components.

## 2011-07-05 NOTE — Progress Notes (Signed)
Subjective:     Patient ID: ELLAMAY FORS, female   DOB: [birth year], 65 y.o.   MRN: [id number]  HPI  64yowf  quit smoking 1976 with tendency to" throat infections" that improved then some worse in mid 90's attributed to environment in [place] better after returning to [place] until around 2009/2010 and since then chronically symptomatic requiring maint rx per Dr Alwyn Ren    04/09/2011 Initial pulmonary office eval in EMR era p trip to ER with chest discomfort attributed to asthma attack despite maint rx with dulera 200/ singulair/  Daily symptoms x sev months feels needs to cough but takes a lot of effort,  Cough worse after supper, not so bad sleeping or when wakes up.  Seems better after nebulizer.  No excess mucus production. Cough has a harsh grinding/ barking quality assoc with sense of chest but not nasal congestion and sob mostly when coughing.   >>rx nexium and zantac , stopped dulera , tramadol for cough > better than in past 2 years   04/20/2011 Acute OV /NP  prod cough with thick yellow mucus, sneezing,- onset x 12h > reports has been battling a "traditional head cold" x4days.  Says symptoms started with tickle in throat, stuffy nose, aches, low grade fevers. "feels like  I have a cold" . Cough is different than it was before. No discolored mucus . CXR last ov with no acute process. No hemoptysis . >>PPI , zantac, tramadol for cough control.    04/23/2011 f/u ov/Valinda Fedie cc intermittently coughing up yelow mucus >>bystolic added.   05/28/2011 Acute OV Complains of Sinus drainage  for 3 days - Started neti pot - Prod cough started yesterday  (Clear to white with some light yellow) - Temp 101 last night. Feels terrible. Mucinex and Tramadol without much relief. Improved to normal after last visit until last 3 days. Has a lot of sinus congestion, pressure and drainage.  Has fever, feels weak.   Had another episode of elevated b/p w/ ER visit. Referred to Cardiology , changed from Bystolic to  Metoprolol. B/p has been improved .  rec Augmentin 875mg  Twice daily  For 14 days -take with food, eat yogurt.    Mucinex DM Twice daily  As needed  Cough/congestion     Saline nasal rinses As needed    Fluids and rest. Tylenol As needed  For fever.    Tramadol 50 mg 1-2  every 4 hours as needed for breakthrough cough.   If you have a trouble breathing use the nebulizer up to every 4 hours as needed    07/05/2011 f/u ov/Eythan Jayne cc still clearing throat but doesn't disturb sleep and "best she's been in years". No excess mucus but persistent sense of excess throat drainage.  No purulent sputum, no sob.  Only used neb twice since last ov and none this week.    Sleeping ok without nocturnal  or early am exacerbation  of respiratory  c/o's or need for noct saba. Also denies any obvious fluctuation of symptoms with weather or environmental changes or other aggravating or alleviating factors except as outlined above   ROS  At present neg for  any significant sore throat, dysphagia, itching, sneezing,  nasal congestion or excess/ purulent secretions,  fever, chills, sweats, unintended wt loss, pleuritic or exertional cp, hempoptysis, orthopnea pnd or leg swelling.  Also denies presyncope, palpitations, heartburn, abdominal pain, nausea, vomiting, diarrhea  or change in bowel or urinary habits, dysuria,hematuria,  rash, arthralgias, visual complaints, headache, numbness  weakness or ataxia.               Objective:   Physical Exam Elderly wf NAD  Wt  148 04/09/2011 >> 145 04/20/2011  > 146 04/23/2011 >>148 05/28/2011 >  07/05/2011 150 HEENT mild turbinate edema. Max sinus tenderness,  Oropharynx no thrush or excess pnd or cobblestoning.  No JVD or cervical adenopathy. Mild accessory muscle hypertrophy. Trachea midline, nl thryroid. Chest was hyperinflated by percussion with diminished breath sounds and moderate increased exp time without wheeze. Hoover sign positive at mid inspiration. Regular rate  and rhythm without murmur gallop or rub or increase P2 or edema.  Abd: no hsm, nl excursion. Ext warm without cyanosis or clubbing.      cxr 05/23/11 1. No acute cardiopulmonary process seen.  2. Right convex thoracic and left convex thoracolumbar scoliosis  noted.       Assessment:         Plan:

## 2011-07-06 LAB — ALLERGY PROFILE REGION II-DC, DE, MD, ~~LOC~~, VA
Bermuda Grass: <0.1 kU/L (ref <0.35)
Cladosporium Herbarum: <0.1 kU/L (ref <0.35)
Common Ragweed: <0.1 kU/L (ref <0.35)
D. farinae: <0.1 kU/L (ref <0.35)
Dog Dander: <0.1 kU/L (ref <0.35)
Lamb's Quarters: <0.1 kU/L (ref <0.35)
Meadow Grass: <0.1 kU/L (ref <0.35)
Oak: <0.1 kU/L (ref <0.35)
Pecan/Hickory Tree IgE: <0.1 kU/L (ref <0.35)

## 2011-07-07 ENCOUNTER — Encounter: Payer: Self-pay | Admitting: Internal Medicine

## 2011-07-12 ENCOUNTER — Ambulatory Visit (INDEPENDENT_AMBULATORY_CARE_PROVIDER_SITE_OTHER): Payer: Medicare Other | Admitting: *Deleted

## 2011-07-12 DIAGNOSIS — Z23 Encounter for immunization: Secondary | ICD-10-CM

## 2011-07-17 ENCOUNTER — Other Ambulatory Visit: Payer: Self-pay | Admitting: Gynecology

## 2011-07-17 ENCOUNTER — Encounter: Payer: Self-pay | Admitting: Internal Medicine

## 2011-08-17 ENCOUNTER — Telehealth: Payer: Self-pay | Admitting: Internal Medicine

## 2011-08-17 MED ORDER — ESOMEPRAZOLE MAGNESIUM 40 MG PO CPDR: 40.0000 mg | DELAYED_RELEASE_CAPSULE | Freq: Every day | ORAL | Status: DC

## 2011-08-17 NOTE — Telephone Encounter (Signed)
Nexium samples given # 30

## 2011-09-05 ENCOUNTER — Encounter: Payer: Self-pay | Admitting: Cardiology

## 2011-09-17 ENCOUNTER — Telehealth: Payer: Self-pay | Admitting: Internal Medicine

## 2011-09-17 MED ORDER — OMEPRAZOLE 40 MG PO CPDR: 40.0000 mg | DELAYED_RELEASE_CAPSULE | Freq: Every day | ORAL | Status: DC

## 2011-09-17 NOTE — Telephone Encounter (Signed)
Patient informed. Medication sent in.

## 2011-09-17 NOTE — Telephone Encounter (Signed)
Can do omeprazole 40 mg daily (generic) which can be as good as Nexium #30 11 refills ok

## 2011-09-17 NOTE — Telephone Encounter (Signed)
Patient called stating that she ran into a respiratory problem and had to go to Dr. Sherene Sires. Dr. Sherene Sires stated that her problem was not asthma of respiratory and it was coming from acid reflux and told her to start back on her Nexium. Patient stated that Nexium is to expensive so Dr. Sherene Sires told her that maybe she could take Prilosec OTC but she would have to take 2 of those. Patient question if this would work just as well as Nexium and if so could she get a prescription sent in for the medication?

## 2011-11-01 ENCOUNTER — Encounter: Payer: Self-pay | Admitting: Internal Medicine

## 2011-11-01 ENCOUNTER — Ambulatory Visit (INDEPENDENT_AMBULATORY_CARE_PROVIDER_SITE_OTHER): Payer: Medicare Other | Admitting: Internal Medicine

## 2011-11-01 DIAGNOSIS — R7301 Impaired fasting glucose: Secondary | ICD-10-CM

## 2011-11-01 DIAGNOSIS — E785 Hyperlipidemia, unspecified: Secondary | ICD-10-CM

## 2011-11-01 DIAGNOSIS — K219 Gastro-esophageal reflux disease without esophagitis: Secondary | ICD-10-CM

## 2011-11-01 DIAGNOSIS — R946 Abnormal results of thyroid function studies: Secondary | ICD-10-CM

## 2011-11-01 DIAGNOSIS — I10 Essential (primary) hypertension: Secondary | ICD-10-CM

## 2011-11-01 DIAGNOSIS — M899 Disorder of bone, unspecified: Secondary | ICD-10-CM

## 2011-11-01 DIAGNOSIS — Z0000 Encounter for general adult medical examination without abnormal findings: Secondary | ICD-10-CM

## 2011-11-01 NOTE — Patient Instructions (Addendum)
Preventive Health Care: Exercise  30-45  minutes a day, 3-4 days a week. Walking is especially valuable in preventing Osteoporosis. Eat a low-fat diet with lots of fruits and vegetables, up to 7-9 servings per day. Consume less than 30 grams of sugar per day from foods & drinks with High Fructose Corn Syrup as # 1,2,3 or #4 on label. Blood Pressure Goal  Ideally is an AVERAGE < 135/85. This AVERAGE should be calculated from @ least 5-7 BP readings taken @ different times of day on different days of week. You should not respond to isolated BP readings , but rather the AVERAGE for that week  The triggers for dyspepsia or reflux  include stress; the "aspirin family" ; alcohol; peppermint; and caffeine (coffee, tea, cola, and chocolate). The aspirin family would include aspirin and the nonsteroidal agents such as ibuprofen &  Naproxen. Tylenol would not cause reflux. If having dyspepsia ; food & drink should be avoided for @ least 2 hours before going to bed.

## 2011-11-01 NOTE — Progress Notes (Signed)
Subjective:    Patient ID: Kayla [name], female    DOB: 14-Nov-1945, 66 y.o.   MRN: [id number]  HPI  Medicare Wellness Visit:  The following psychosocial & medical history were reviewed as required by Medicare.   Social history: caffeine: 2 cups coffee & 2 cups of tea, alcohol: no ,  tobacco use :quit 1976  & exercise : walking daily & [employer].   Home & personal  safety / fall risk: no issues, activities of daily living: no limitations except in relation to  , seatbelt use : yes , and smoke alarm employment : yes .  Power of Attorney/Living Will status : in place  Vision ( as recorded per Nurse) & Hearing  evaluation :  See exam. Orientation :oriented X 3 , memory & recall :good, spelling  testing: good,and mood & affect : normal . Depression / anxiety: denied Travel history : never , immunization status :Shingles needed , transfusion history: no, and preventive health surveillance ( colonoscopies, BMD , etc as per protocol/ [facility]): colonoscopy up to date, Dental care:  Seen annually . Chart reviewed &  Updated. Active issues reviewed & addressed.       Review of Systems  She denies significant active health issues. She does note intermittent whiteness of the right index finger and thumb without cold exposure. This improves when she runs warm water over her digits. She does have extrinsic symptomatology which response to Zyrtec. This seems to work better than [employer]. She questions taking calcium. She does have osteopenia. Usual recommendations were discussed with her; Dr Nicholas Lose has recommended no calcium supplementation. Her last calcium level was 9.2. She questions the need for allergy alert necklace or bracelet. With documented urticaria and serious allergic reactions to multiple agents ( most are  antibiotics ), this would be appropriate.     Objective:   Physical Exam Gen.: Healthy and well-nourished in appearance. Alert, appropriate and cooperative throughout exam. Head: Normocephalic  without obvious abnormalities Eyes: No corneal or conjunctival inflammation noted. Pupils equal round reactive to light and accommodation. Fundal exam is benign without hemorrhages, exudate, papilledema. Extraocular motion intact. Vision grossly normal with lenses. Ears: External  ear exam reveals no significant lesions or deformities. Canals clear .TMs normal. Hearing is grossly normal bilaterally . Nose: External nasal exam reveals no deformity or inflammation. Nasal mucosa are pink and moist. No lesions or exudates noted.  Mouth: Oral mucosa and oropharynx reveal no lesions or exudates. Teeth in good repair. Neck: No deformities, masses, or tenderness noted. Range of motion & Thyroid normal Lungs: Normal respiratory effort; chest expands symmetrically. Lungs are clear to auscultation with only a rare scattered wheeze w/o increased work of breathing. Heart: Normal rate and rhythm. Normal S1 and S2. No gallop, click, or rub. No murmur. Abdomen: Bowel sounds normal; abdomen soft and nontender. No masses, organomegaly or hernias noted.Aorta palpable ; no AAA  Genitalia: Dr Nicholas Lose                                                                            Musculoskeletal/extremities: No deformity or scoliosis noted of  the thoracic or lumbar spine. No clubbing, cyanosis, edema, or deformity noted. Range of motion  normal .  Tone & strength  normal.Joints normal. Nail health : fungal changes R great toe nail Vascular: Carotid, radial artery, dorsalis pedis and  posterior tibial pulses are full and equal. No bruits present. Neurologic: Alert and oriented x3. Deep tendon reflexes symmetrical and normal.          Skin: Intact without suspicious lesions or rashes. Lymph: No cervical, axillary lymphadenopathy present. Psych: Mood and affect are normal. Normally interactive                                                                                        Assessment & Plan:  #1 Medicare Wellness Exam;  criteria met ; data entered #2 Problem List reviewed ; Assessment/ Recommendations made Plan: see Orders

## 2011-11-02 LAB — BASIC METABOLIC PANEL
Calcium: 9.5 mg/dL (ref 8.4–10.5)
Creatinine, Ser: 0.7 mg/dL (ref 0.4–1.2)
GFR: 91.97 mL/min (ref >60.00)
Sodium: 137 mEq/L (ref 135–145)

## 2011-11-02 LAB — VITAMIN D 25 HYDROXY (VIT D DEFICIENCY, FRACTURES): Vit D, 25-Hydroxy: 49 ng/mL (ref 30–89)

## 2011-11-02 LAB — HEMOGLOBIN A1C: Hgb A1c MFr Bld: 5.4 % (ref 4.6–6.5)

## 2011-11-02 LAB — TSH: TSH: 2.3 u[IU]/mL (ref 0.35–5.50)

## 2011-12-19 ENCOUNTER — Other Ambulatory Visit: Payer: Self-pay | Admitting: Internal Medicine

## 2012-02-29 ENCOUNTER — Telehealth: Payer: Self-pay | Admitting: Internal Medicine

## 2012-02-29 ENCOUNTER — Ambulatory Visit (INDEPENDENT_AMBULATORY_CARE_PROVIDER_SITE_OTHER): Payer: Medicare Other | Admitting: Family Medicine

## 2012-02-29 VITALS — BP 121/78 | HR 63 | Temp 98.9°F | Ht 62.0 in | Wt 154.8 lb

## 2012-02-29 DIAGNOSIS — R0982 Postnasal drip: Secondary | ICD-10-CM | POA: Insufficient documentation

## 2012-02-29 DIAGNOSIS — R05 Cough: Secondary | ICD-10-CM

## 2012-02-29 NOTE — Telephone Encounter (Signed)
Patient in office to be seen today

## 2012-02-29 NOTE — Assessment & Plan Note (Signed)
Most likely due to PND, no evidence of bacterial infxn on PE.  Start mucinex.  Continue allergy meds.  Reviewed supportive care and red flags that should prompt return.  Pt expressed understanding and is in agreement w/ plan.

## 2012-02-29 NOTE — Assessment & Plan Note (Signed)
Likely cause of cough.  Increase nasal steroid to daily.  Add mucinex.  Reviewed supportive care and red flags that should prompt return.  Pt expressed understanding and is in agreement w/ plan.

## 2012-02-29 NOTE — Telephone Encounter (Signed)
Caller: Kayla [name]/Patient; PCP: Marga Melnick; CB#: [phone];  Call regarding Asthma- Peak Flow in normal zone but not as high normal; having tightness in chest with breathing on and off and slight expiratory wheeze and having to clear throat frequently for past few days Occasional cough productive for white sputum. Taking 600mg s Muscinex daily prn. Used Rescue Inhaler-ProAir on 02/28/12 and seemed to help but Inhaler expired - April 2013. WONDERING IF ANY SAMPLES AVAILABLE IN OFFICE. Dulera stopped Aug 2012 once started on meds for reflux. Still has Dulera Inhaler at home that she can use if needed.  Last seen in office- Feb 2013. Afebrile. Triage and Care advise per Asthma Protocol and appnt advised within 4 hours for "New onset or worsening cough AND asthma with increasing frequency of flair-ups since last scheduled appnt". Appnt scheduled at 1315 -02/29/12 with Dr. Beverely Low.

## 2012-02-29 NOTE — Progress Notes (Signed)
  Subjective:    Patient ID: Kayla [name], female    DOB: 05-12-1946, 66 y.o.   MRN: [id number]  HPI PND- constant need to clear throat, taking Mucinex, drinking increased amounts of water.  Particularly bad over the last few days.  This is prompting productive cough- 'thick white mucous'.  Congestion is causing some difficulty getting a deep breath and mild wheezing.  Mild sore throat.  Minimal nasal congestion.  Taking Zyrtec and singulair daily.  Not using Flonase regularly.  Using Netti pot.   Review of Systems For ROS see HPI     Objective:   Physical Exam  Vitals reviewed. Constitutional: She appears well-developed and well-nourished. No distress.  HENT:  Head: Normocephalic and atraumatic.  Right Ear: Tympanic membrane normal.  Left Ear: Tympanic membrane normal.  Nose: Mucosal edema and rhinorrhea present. Right sinus exhibits no maxillary sinus tenderness and no frontal sinus tenderness. Left sinus exhibits no maxillary sinus tenderness and no frontal sinus tenderness.  Mouth/Throat: Mucous membranes are normal. Posterior oropharyngeal erythema (w/ PND) present.  Eyes: Conjunctivae and EOM are normal. Pupils are equal, round, and reactive to light.  Neck: Normal range of motion. Neck supple.  Cardiovascular: Normal rate, regular rhythm and normal heart sounds.   Pulmonary/Chest: Effort normal. No respiratory distress. She has wheezes (faint end expiratory wheezes in RUL). She has no rales.  Lymphadenopathy:    She has no cervical adenopathy.          Assessment & Plan:

## 2012-02-29 NOTE — Patient Instructions (Addendum)
This is a viral/allergy combo Increase the Mucinex to twice daily Use the inhaler/nebulizer 2-3x/day until feeling better Treat your cough the way Pulmonary instructed earlier this year Continue the Zyrtec and Singular daily Restart the Flonase- 2 sprays each nostril daily Call with any questions or concerns Hang in there!!!

## 2012-04-01 ENCOUNTER — Other Ambulatory Visit: Payer: Self-pay | Admitting: Cardiology

## 2012-04-22 ENCOUNTER — Telehealth: Payer: Self-pay | Admitting: Internal Medicine

## 2012-04-22 ENCOUNTER — Ambulatory Visit (INDEPENDENT_AMBULATORY_CARE_PROVIDER_SITE_OTHER): Payer: Medicare Other | Admitting: Internal Medicine

## 2012-04-22 ENCOUNTER — Encounter: Payer: Self-pay | Admitting: Internal Medicine

## 2012-04-22 VITALS — BP 122/80 | HR 86 | Temp 98.4°F | Wt 155.0 lb

## 2012-04-22 DIAGNOSIS — J029 Acute pharyngitis, unspecified: Secondary | ICD-10-CM

## 2012-04-22 MED ORDER — AZITHROMYCIN 250 MG PO TABS: ORAL_TABLET | ORAL | Status: AC

## 2012-04-22 NOTE — Patient Instructions (Addendum)
Plain Mucinex for thick secretions ;force NON dairy fluids . Use a Neti pot daily as needed for sinus congestion; going from open side to congested side . Nasal cleansing in the shower as discussed. Make sure that all residual soap is removed to prevent irritation. Fluticasone 1 spray in each nostril twice a day as needed. Use the "crossover" technique as discussed. Plain Allegra 160 daily as needed for itchy eyes & sneezing. Zicam Melts or Zinc lozenges as needed for sore throat & vitamin C 2000 mg daily. Report fever, exudate("pus") or progressive pain. Did not fill the antibiotic unless the signs of infection are present

## 2012-04-22 NOTE — Progress Notes (Signed)
  Subjective:    Patient ID: Kayla [name], female    DOB: [birth year], 66 y.o.   MRN: [id number]  HPI She's had a sore throat since  04/20/12; she also describes some aching in her face and ears. She denies frank frontal headache pain, facial pain, nasal purulence, and dental pain. She has had some sweats without chills and fever.  She attributed the sore throat postnasal drainage and employed Mucinex, Flonase, saline nasal gavage, and a Neti pot    Review of Systems She describes some mild extrinsic symptoms but she has been on Allegra . She has had some white sputum; she's been using her albuterol twice a day for mild reactive airways symptoms     Objective:   Physical Exam General appearance:good health ;well nourished; no acute distress or increased work of breathing is present.  No  lymphadenopathy about the head, neck, or axilla noted.   Eyes: No conjunctival inflammation or lid edema is present.   Ears:  External ear exam shows no significant lesions or deformities.  Otoscopic examination reveals clear canals, tympanic membranes are intact bilaterally without bulging, retraction, inflammation or discharge.  Nose:  External nasal examination shows no deformity or inflammation. Nasal mucosa are dry and erythematous without lesions or exudates. No septal dislocation or deviation.No obstruction to airflow.   Oral exam: Dental hygiene is good; lips and gums are healthy appearing.There is no oropharyngeal erythema or exudate noted.   Neck:  No deformities, thyromegaly, masses, or tenderness noted.    Heart:  Normal rate and regular rhythm. S1 and S2 normal without gallop, murmur, click, rub or other extra sounds.   Lungs: There is no increased work of breathing; she has  mild isolated respiratory pops/wheezes on the right posteriorly   Extremities:  No cyanosis, edema, or clubbing  noted    Skin: Cool and slightly damp to touch         Assessment & Plan:  #1 pharyngitis;  negative beta strep  #2 low-grade, asymmetric wheezing  Plan: See orders and recommendations

## 2012-04-22 NOTE — Telephone Encounter (Signed)
FYI per apt today

## 2012-04-22 NOTE — Telephone Encounter (Signed)
Noted  

## 2012-04-22 NOTE — Telephone Encounter (Signed)
Caller: [name]/Patient; Patient Name: Kayla [name]; PCP: Marga Melnick; Best Callback Phone Number: [phone].  Called re worsening sore throat.  Onset: 04/20/12.  Afebrile. Throat is red with blisters on pharynx.  Frequent throat clearing 04/21/12; Advised to keep appt 04/22/12 for recent or recurrent episodes of sneezing, nasal congestion, watery nasal drainage unrelieved after one week of home care measures per Sore Throat or Hoarseness Guideline. Already has scheduled appointment for 1630 04/22/12.

## 2012-04-30 ENCOUNTER — Telehealth: Payer: Self-pay

## 2012-04-30 NOTE — Telephone Encounter (Signed)
Call from patient and she stated she wanted to know when she should use her nebulizer or rescue inhaler, I advised when wheezing or having chest tightness, she voiced understanding. I asked was she having and symptoms, she denied SOB, denied respiratory distress,denied fever, but very little cough and some wheezing off an on, but she stated she felt fine and her peak flows were normal. I made her aware if anything changes and she starts to feel bad she can call for and evaluation, she agreed and also stated she did not take the Abx Hop gave her because she felt fine.     KP

## 2012-05-27 ENCOUNTER — Encounter: Payer: Self-pay | Admitting: Internal Medicine

## 2012-05-27 NOTE — Telephone Encounter (Signed)
Check with the insurance company as to coverage for these 2. I would get the most cost effective. Certainly get the newer flu shot unless it  is prohibitively expensive.

## 2012-06-16 ENCOUNTER — Other Ambulatory Visit: Payer: Self-pay | Admitting: Internal Medicine

## 2012-06-19 ENCOUNTER — Other Ambulatory Visit: Payer: Self-pay | Admitting: Cardiology

## 2012-08-04 ENCOUNTER — Other Ambulatory Visit: Payer: Self-pay | Admitting: Cardiology

## 2012-09-04 ENCOUNTER — Telehealth: Payer: Self-pay | Admitting: Internal Medicine

## 2012-09-04 NOTE — Telephone Encounter (Signed)
Spoke with patient in reference to Staff message sent by Dr.Hopper, patient will have husband drop jury duty letter off tomorrow.    Alinda Money (patient's husband) will see Cardiology on 09/11/2012  Patient then indicated that she has a productive cough and would like to be seen, patient placed in open slot for tomorrow with Dr.Tabori @ 10/15, primary care MD is out of office on Friday 09/05/12

## 2012-09-04 NOTE — Telephone Encounter (Signed)
Message copied by Maurice Small on Thu Sep 04, 2012  4:55 PM ------      Message from: Pecola Lawless      Created: Thu Sep 04, 2012 12:55 PM       I need the jury request form she received in order to write a letter excusing her.            Has Alinda Money seen a cardiologist yet ?

## 2012-09-04 NOTE — Telephone Encounter (Signed)
Patient Information:  Caller Name: Cheyann  Phone: [phone]  Patient: Kayla [name], Kayla [name]  Gender: Female  DOB: [birth year]  Age: 66 Years  PCP: Marga Melnick  Office Follow Up:  Does the office need to follow up with this patient?: No  Instructions For The Office: N/A  RN Note:  Has not use Peak flow meter due to it makes her cough. Peak flow at 450 (normal is 450-500). Has been using Albuterol MDI BID or TID or 3 days. Instructed to use either MDI OR nebulizer treatment every 4-6 hours prn wheezing or cough.  Symptoms  Reason For Call & Symptoms: Called with question about using Albuterol nebulizer; Instructions say to ask MD before use. Does she need permission to use it?  Reports productive cough, mild sore throat with intermittent wheezing.  Reviewed Health History In EMR: Yes  Reviewed Medications In EMR: Yes  Reviewed Allergies In EMR: Yes  Reviewed Surgeries / Procedures: Yes  Date of Onset of Symptoms: 08/31/2012  Treatments Tried: Albuterol neb and MDI, Mucinex, Flonase, Zambucal otc.  Treatments Tried Worked: Yes  Guideline(s) Used:  Athlete's Foot  Asthma Attack  Disposition Per Guideline:   Home Care  Reason For Disposition Reached:   Mild asthma attack (e.g., no SOB at rest, mild SOB with walking, speaks normally in sentences, mild wheezing)  Advice Given:  Drinking Liquids:  Try to drink normal amount of liquids (e.g., water). Being adequately hydrated makes it easier to cough up the sticky lung mucus.  Humidifier:   If the air is dry, use a cool mist humidifier to prevent drying of the upper airway.  Expected Course:  If treatment is started early, most asthma attacks are quickly brought under control. All wheezing should be gone by 5 days.  Call Back If:  Inhaled asthma medicine (nebulizer or inhaler) is needed more often than every 4 hours  Wheezing has not completely cleared after 5 days  You become worse.  Using a Peak Flow Meter to Determine the  Severity of an Asthma Attack:  GREEN Zone - MILD Attack: PEFR 80-100% of personal best  YELLOW Zone - MODERATE Attack: PEFR 50-80%  RED Zone - SEVERE Attack: PEFR less than 50%

## 2012-09-05 ENCOUNTER — Ambulatory Visit (INDEPENDENT_AMBULATORY_CARE_PROVIDER_SITE_OTHER): Payer: Medicare Other | Admitting: Family Medicine

## 2012-09-05 ENCOUNTER — Encounter: Payer: Self-pay | Admitting: Family Medicine

## 2012-09-05 VITALS — BP 140/70 | HR 61 | Temp 98.8°F | Ht 62.0 in | Wt 155.4 lb

## 2012-09-05 DIAGNOSIS — J189 Pneumonia, unspecified organism: Secondary | ICD-10-CM | POA: Insufficient documentation

## 2012-09-05 DIAGNOSIS — J168 Pneumonia due to other specified infectious organisms: Secondary | ICD-10-CM

## 2012-09-05 MED ORDER — AMOXICILLIN-POT CLAVULANATE 875-125 MG PO TABS: 1.0000 | ORAL_TABLET | Freq: Two times a day (BID) | ORAL | Status: AC

## 2012-09-05 MED ORDER — AMOXICILLIN-POT CLAVULANATE 875-125 MG PO TABS: 1.0000 | ORAL_TABLET | Freq: Two times a day (BID) | ORAL | Status: DC

## 2012-09-05 NOTE — Assessment & Plan Note (Signed)
New.  Due to pt's underlying asthma, progressive worsening of sxs, and crackles in LUL will tx for PNA.  Start abx.  Pt has ultram for cough.  Reviewed supportive care and red flags that should prompt return.  Pt expressed understanding and is in agreement w/ plan.

## 2012-09-05 NOTE — Progress Notes (Signed)
  Subjective:    Patient ID: Kayla [name], female    DOB: March 25, 1946, 66 y.o.   MRN: [id number]  HPI URI- cough is wet, productive of 'thick white sputum'.  sxs started w/ 'scratchy throat' on Sunday.  sxs have been progressively worsening throughout the week.  No fevers.  + nasal congestion, had some early sinus pressure but this has resolved.  No current ear pain, previously were 'uncomfortable'.  Sore throat has resolved.  Pt takes Sambucol when she starts to feel badly.  No known sick contacts.  + hx of asthma.  Has had some increased wheezing recently.   Review of Systems For ROS see HPI     Objective:   Physical Exam  Vitals reviewed. Constitutional: She appears well-developed and well-nourished. No distress.  HENT:  Head: Normocephalic and atraumatic.       TMs normal bilaterally Mild nasal congestion Throat w/out erythema, edema, or exudate  Eyes: Conjunctivae normal and EOM are normal. Pupils are equal, round, and reactive to light.  Neck: Normal range of motion. Neck supple.  Cardiovascular: Normal rate, regular rhythm, normal heart sounds and intact distal pulses.   No murmur heard. Pulmonary/Chest: Effort normal. No respiratory distress. She has no wheezes. She has rales (coarse breath sounds in LUL, some crackles).       + hacking cough  Lymphadenopathy:    She has no cervical adenopathy.          Assessment & Plan:

## 2012-09-05 NOTE — Patient Instructions (Addendum)
Start the Augmentin for suspected L upper lobe pneumonia- take w/ food Tylenol for pain/fever Drink plenty of fluids Continue the mucinex to thin your congestion- take the 2 tabs for the next few days Use the tramadol as needed to suppress your cough REST! Call with any questions or concerns Hang in there and Happy Birthday!!!

## 2012-09-10 ENCOUNTER — Encounter: Payer: Self-pay | Admitting: Internal Medicine

## 2012-09-11 ENCOUNTER — Telehealth: Payer: Self-pay | Admitting: *Deleted

## 2012-09-11 NOTE — Telephone Encounter (Signed)
Pt called to clarify how she needs to take med. Per Pt she was advise to take med 10 day but Rx was Disp for 14 days. Verbally advise Dr Beverely Low who states that disp # is a error. Pt to only take med for 10 days. Pt verbally informed to take med 10 day not 14 day. Pt ok, verbalized understanding.

## 2012-09-12 ENCOUNTER — Other Ambulatory Visit: Payer: Self-pay | Admitting: Cardiology

## 2012-09-23 ENCOUNTER — Encounter: Payer: Self-pay | Admitting: Internal Medicine

## 2012-09-23 NOTE — Telephone Encounter (Signed)
Hopp please advise on question about jury duty, other information updated

## 2012-10-13 ENCOUNTER — Other Ambulatory Visit: Payer: Self-pay | Admitting: Internal Medicine

## 2012-11-05 ENCOUNTER — Encounter: Payer: Self-pay | Admitting: Internal Medicine

## 2012-11-08 ENCOUNTER — Other Ambulatory Visit: Payer: Self-pay | Admitting: Internal Medicine

## 2012-11-08 ENCOUNTER — Other Ambulatory Visit: Payer: Self-pay | Admitting: Cardiology

## 2012-11-10 ENCOUNTER — Other Ambulatory Visit: Payer: Self-pay

## 2012-11-10 ENCOUNTER — Telehealth: Payer: Self-pay | Admitting: Internal Medicine

## 2012-11-10 MED ORDER — OMEPRAZOLE 40 MG PO CPDR: 40.0000 mg | DELAYED_RELEASE_CAPSULE | Freq: Every day | ORAL | Status: DC

## 2012-11-10 MED ORDER — METOPROLOL TARTRATE 25 MG PO TABS: ORAL_TABLET | ORAL | Status: DC

## 2012-11-10 NOTE — Telephone Encounter (Signed)
Appointment made, refill sent to [facility] as requested.

## 2012-11-13 ENCOUNTER — Telehealth: Payer: Self-pay | Admitting: Internal Medicine

## 2012-11-13 NOTE — Telephone Encounter (Signed)
That is fine with me.

## 2012-11-13 NOTE — Telephone Encounter (Signed)
pt would like to have PAP/perlvic exam done here as DR.Lomax will be retiring next week.  pt wants to know if one of the Female physicians will assume this here? cb# [phone] Pt is aware dr.hopper does not perform PAP smears and would like to just come here versus seeking another GYN Pt is medicare

## 2012-11-13 NOTE — Telephone Encounter (Signed)
Called pt on home pone at 3:15pm advised as below, pt stated she would call me back to schedule to separate appts. To have cpe & Pap/pelvic NOTE last cpe 2.21.13, last PAP/Pelvic was done by dr.lomax and pt will advise when she calls back

## 2012-12-01 ENCOUNTER — Encounter: Payer: Self-pay | Admitting: Internal Medicine

## 2012-12-01 ENCOUNTER — Ambulatory Visit (INDEPENDENT_AMBULATORY_CARE_PROVIDER_SITE_OTHER): Payer: Medicare Other | Admitting: Internal Medicine

## 2012-12-01 VITALS — BP 136/80 | HR 60 | Ht 62.0 in | Wt 156.2 lb

## 2012-12-01 DIAGNOSIS — K589 Irritable bowel syndrome without diarrhea: Secondary | ICD-10-CM

## 2012-12-01 DIAGNOSIS — M899 Disorder of bone, unspecified: Secondary | ICD-10-CM

## 2012-12-01 DIAGNOSIS — Z86010 Personal history of colonic polyps: Secondary | ICD-10-CM

## 2012-12-01 DIAGNOSIS — M949 Disorder of cartilage, unspecified: Secondary | ICD-10-CM

## 2012-12-01 DIAGNOSIS — M8580 Other specified disorders of bone density and structure, unspecified site: Secondary | ICD-10-CM

## 2012-12-01 DIAGNOSIS — K219 Gastro-esophageal reflux disease without esophagitis: Secondary | ICD-10-CM

## 2012-12-01 MED ORDER — OMEPRAZOLE 40 MG PO CPDR: 40.0000 mg | DELAYED_RELEASE_CAPSULE | Freq: Every day | ORAL | Status: DC

## 2012-12-01 NOTE — Progress Notes (Signed)
  Subjective:    Patient ID: Kayla [name], female    DOB: December 14, 1945, 67 y.o.   MRN: [id number]  HPI She presents for followup. She has a typical GERD and takes omeprazole 40 mg daily. This helps hoarseness and cough. She has also had some IBS issues in the past and does well with Align and sings its praises. In 2012 she had a DEXA scan that showed osteopenia in the hips. Spine assessment not done. She did try what sounds like a bisphosphonate or 2 but there were quite a few side effects. She's taking magnesium supplement as well as eating a large amount of calcium in her diet and using vitamin D. She wonders if her PPI could be contributing to bone loss. She is exercising regularly. Medications, allergies, past medical history, past surgical history, family history and social history are reviewed and updated in the EMR.  Review of Systems As above    Objective:   Physical Exam Well developed well-nourished no acute distress    Assessment & Plan:   1. GERD (gastroesophageal reflux disease)   2. Osteopenia   3. IBS (irritable bowel syndrome)        1. She is doing well overall. 2. To continue align and omeprazole. 3. Samples of Nexium were given, she interspersed as this on occasion and feels that she does better using that intermittently here and there along with regular omeprazole. 4. We discussed the possibility of bone loss with PPI therapy. It is known but not entirely clear cut from the studies. However she understands and accepts that the PPI provided significant relief for her and wishes to continue this. 5. I have printed some information about osteoporosis which she does not have at this point, as well as exercise, I have asked her to continue high calcium intake, vitamin D supplementation. Magnesium supplementation is likely reasonable as well. I've also asked her to follow with Dr. Alwyn Ren about other questions on this, if she has them. 6. See me in a year, sooner as  needed

## 2012-12-01 NOTE — Patient Instructions (Addendum)
Today we are giving you Nexium samples to use as needed, one 30 minutes prior to breakfast.  We have sent the following medications to your pharmacy for you to pick up at your convenience: Generic Prilosec  Please read the osteoporosis information you have been given today.  Follow up with Dr. Alwyn Ren.  Follow up with [place] in a year or soon if needed.  Thank you for choosing me and [facility].  Iva Boop, M.D., [facility]   Samples given to patient    Lot# [id number]                  Exp.03/2015                  Amount#30

## 2012-12-08 ENCOUNTER — Other Ambulatory Visit: Payer: Self-pay | Admitting: Internal Medicine

## 2012-12-15 ENCOUNTER — Other Ambulatory Visit: Payer: Self-pay | Admitting: Internal Medicine

## 2012-12-18 ENCOUNTER — Ambulatory Visit (INDEPENDENT_AMBULATORY_CARE_PROVIDER_SITE_OTHER): Payer: Medicare Other | Admitting: Internal Medicine

## 2012-12-18 ENCOUNTER — Encounter: Payer: Self-pay | Admitting: Internal Medicine

## 2012-12-18 VITALS — BP 130/80 | HR 56 | Temp 98.2°F | Resp 14 | Ht 62.5 in | Wt 156.0 lb

## 2012-12-18 DIAGNOSIS — Z0000 Encounter for general adult medical examination without abnormal findings: Secondary | ICD-10-CM

## 2012-12-18 DIAGNOSIS — E559 Vitamin D deficiency, unspecified: Secondary | ICD-10-CM

## 2012-12-18 DIAGNOSIS — Z86010 Personal history of colonic polyps: Secondary | ICD-10-CM

## 2012-12-18 DIAGNOSIS — E785 Hyperlipidemia, unspecified: Secondary | ICD-10-CM

## 2012-12-18 DIAGNOSIS — M949 Disorder of cartilage, unspecified: Secondary | ICD-10-CM

## 2012-12-18 DIAGNOSIS — M899 Disorder of bone, unspecified: Secondary | ICD-10-CM

## 2012-12-18 DIAGNOSIS — I10 Essential (primary) hypertension: Secondary | ICD-10-CM

## 2012-12-18 MED ORDER — MONTELUKAST SODIUM 10 MG PO TABS: ORAL_TABLET | ORAL | Status: DC

## 2012-12-18 NOTE — Progress Notes (Signed)
Subjective:    Patient ID: Kayla [name], female    DOB: [birth year], 67 y.o.   MRN: [id number]  HPI Medicare Wellness Visit:  Psychosocial & medical history were reviewed as required by Medicare (abuse,antisocial behavioral risks,firearm risk).  Social history: caffeine:3 small cups coffee/ day  , alcohol: no  ,  tobacco use: quit 1976   Exercise : walking 3-4 X or > / week No home & personal  safety / fall risk Activities of daily living: no limitations  Seatbelt  and smoke alarm employed. Power of Attorney/Living Will status : in place Ophthalmology exam current Hearing evaluation not current Orientation :oriented X 3  Memory & recall :good Math testing:good Mood & affect : interactive . Depression / anxiety: denied Travel history :  never  Immunization status : Shingles needed Transfusion history:  Only autologous with NS  Preventive health surveillance ( colonoscopy, BMD , mammograms,PAP as per protocol/ [facility]): current  Dental care:  Every 12 mos. Chart reviewed &  Updated. Active issues reviewed & addressed.      Review of Systems  HYPERTENSION follow-up:  Home blood pressure range 110/70-138/83  Patient is compliant with medications  No adverse effects noted from medication; pulse in 50s  On heart healthy,  low carb ,low-fat low-salt diet   No chest pain, palpitations,  claudication,edema or paroxysmal nocturnal dyspnea described. Occasional DOE , ? from RAD. Rescue MDI rarely needed.  No significant lightheadedness, headache, significant epistaxis, or syncope         Objective:   Physical Exam Gen.: Healthy and well-nourished in appearance. Alert, appropriate and cooperative throughout exam.  Head: Normocephalic without obvious abnormalities Eyes: No corneal or conjunctival inflammation noted. Slight lid lag. Extraocular motion intact. Vision grossly normal with lenses Ears: External  ear exam reveals no significant lesions or deformities. Canals clear  .TMs normal. Hearing is grossly normal bilaterally. Nose: External nasal exam reveals no deformity or inflammation. Nasal mucosa are pink and moist. No lesions or exudates noted.   Mouth: Oral mucosa and oropharynx reveal no lesions or exudates. Teeth in good repair. Neck: No deformities, masses, or tenderness noted. Range of motion & Thyroid normal. Lungs: Normal respiratory effort; chest expands symmetrically. Lungs are clear to auscultation without rales, wheezes, or increased work of breathing. Heart: Slow rate and regular rhythm. Normal S1 and S2. No gallop, click, or rub.No murmur. Abdomen: Bowel sounds normal; abdomen soft and nontender. No masses, organomegaly or hernias noted. Genitalia: As per Gyn                                  Musculoskeletal/extremities: No deformity or scoliosis noted of  the thoracic or lumbar spine; well healed op scar in mid line.  No clubbing, cyanosis, edema, or significant extremity  deformity noted. Range of motion normal .Tone & strength  Normal. Joints normal . Nail health good. Able to lie down & sit up w/o help. Negative SLR bilaterally Vascular: Carotid, radial artery, dorsalis pedis and  posterior tibial pulses are full and equal. No bruits present. Neurologic: Alert and oriented x3. Deep tendon reflexes symmetrical and normal.         Skin: Intact without suspicious lesions or rashes. Lymph: No cervical, axillary lymphadenopathy present. Psych: Mood and affect are normal. Normally interactive  Assessment & Plan:  #1 Medicare Wellness Exam; criteria met ; data entered #2 Problem List reviewed ; Assessment/ Recommendations made Plan: see Orders

## 2012-12-18 NOTE — Patient Instructions (Addendum)
Preventive Health Care: Exercise  30-45  minutes a day, 3-4 days a week. Walking is especially valuable in preventing Osteoporosis. Eat a low-fat diet with lots of fruits and vegetables, up to 7-9 servings per day. This would eliminate need for vitamin supplements for most individuals. Consume less than 30 grams of sugar per day from foods & drinks with High Fructose Corn Syrup as #2,3 or #4 on label. Review and correct the record as indicated. Please share record with all medical staff seen.

## 2012-12-19 LAB — BASIC METABOLIC PANEL
BUN: 11 mg/dL (ref 6–23)
GFR: 107.97 mL/min (ref >60.00)
Potassium: 3.8 mEq/L (ref 3.5–5.1)
Sodium: 134 mEq/L — ABNORMAL LOW (ref 135–145)

## 2012-12-19 LAB — CBC WITH DIFFERENTIAL/PLATELET
Eosinophils Relative: 0.9 % (ref 0.0–5.0)
HCT: 40.3 % (ref 36.0–46.0)
Lymphocytes Relative: 22.4 % (ref 12.0–46.0)
Lymphs Abs: 1 10*3/uL (ref 0.7–4.0)
Monocytes Relative: 6.6 % (ref 3.0–12.0)
Platelets: 161 10*3/uL (ref 150.0–400.0)
WBC: 4.6 10*3/uL (ref 4.5–10.5)

## 2012-12-19 LAB — TSH: TSH: 1.22 u[IU]/mL (ref 0.35–5.50)

## 2012-12-19 LAB — LIPID PANEL
Cholesterol: 229 mg/dL — ABNORMAL HIGH (ref 0–200)
Total CHOL/HDL Ratio: 3

## 2012-12-19 LAB — HEPATIC FUNCTION PANEL
ALT: 22 U/L (ref 0–35)
AST: 27 U/L (ref 0–37)
Alkaline Phosphatase: 92 U/L (ref 39–117)
Bilirubin, Direct: 0.1 mg/dL (ref 0.0–0.3)
Total Bilirubin: 0.6 mg/dL (ref 0.3–1.2)

## 2012-12-23 LAB — VITAMIN D 1,25 DIHYDROXY
Vitamin D 1, 25 (OH)2 Total: 66 pg/mL (ref 18–72)
Vitamin D2 1, 25 (OH)2: <8 pg/mL
Vitamin D3 1, 25 (OH)2: 66 pg/mL

## 2012-12-25 ENCOUNTER — Other Ambulatory Visit: Payer: Self-pay | Admitting: Cardiology

## 2012-12-29 ENCOUNTER — Telehealth: Payer: Self-pay

## 2012-12-29 MED ORDER — ALIGN PO CAPS: 1.0000 | ORAL_CAPSULE | Freq: Every day | ORAL | Status: AC

## 2012-12-29 NOTE — Telephone Encounter (Signed)
Patient walked in and requested samples of Align to get her to the first of the month.  Gave her 3 boxes of Align and also $3.00 coupons.

## 2013-01-30 ENCOUNTER — Other Ambulatory Visit: Payer: Self-pay | Admitting: Cardiology

## 2013-02-03 NOTE — Telephone Encounter (Signed)
..  Patient needs to contact office to schedule  Appointment  for future refills.Ph:[phone]. Thank you.  

## 2013-02-10 ENCOUNTER — Telehealth: Payer: Self-pay | Admitting: Internal Medicine

## 2013-02-10 NOTE — Telephone Encounter (Signed)
Opened in error

## 2013-02-16 ENCOUNTER — Telehealth: Payer: Self-pay | Admitting: Internal Medicine

## 2013-02-16 ENCOUNTER — Ambulatory Visit (INDEPENDENT_AMBULATORY_CARE_PROVIDER_SITE_OTHER): Payer: Medicare Other | Admitting: Family Medicine

## 2013-02-16 ENCOUNTER — Encounter: Payer: Self-pay | Admitting: Family Medicine

## 2013-02-16 VITALS — BP 138/86 | HR 56 | Temp 98.1°F | Wt 157.2 lb

## 2013-02-16 DIAGNOSIS — S6000XA Contusion of unspecified finger without damage to nail, initial encounter: Secondary | ICD-10-CM | POA: Insufficient documentation

## 2013-02-16 NOTE — Patient Instructions (Addendum)
This appears to be a finger sprain Ice, ibuprofen or aspirin as needed If no improvement or worsening in the next 7-10 days- please call Sherri Rad in there!!!

## 2013-02-16 NOTE — Progress Notes (Signed)
  Subjective:    Patient ID: Kayla [name], female    DOB: Aug 17, 1946, 67 y.o.   MRN: [id number]  HPI L index finger w/ bruising.  Last night was hard and swollen.  'tiniest little bit sore'.  Noticed last night while flossing- wondered if she wrapped the string too tight.  Then turned the water knob and 'it felt like a strong bee sting'.  Has hx of burst blood vessels.  No swelling this AM.  Remains bruised.   Review of Systems For ROS see HPI     Objective:   Physical Exam  Vitals reviewed. Constitutional: She is oriented to person, place, and time. She appears well-developed and well-nourished. No distress.  Musculoskeletal: Normal range of motion. She exhibits tenderness (mild TTP along L index finger middle phalange, no ligamentaous instability). She exhibits no edema.  Neurological: She is alert and oriented to person, place, and time.  Skin: Skin is warm and dry. No erythema.          Assessment & Plan:

## 2013-02-16 NOTE — Telephone Encounter (Signed)
Patient Information:  Caller Name: Kayla [name]  Phone: [phone]  Patient: Kayla [name]  Gender: Female  DOB: Aug 12, 1946  Age: 67 Years  PCP: Kayla [name]  Office Follow Up:  Does the office need to follow up with this patient?: No  Instructions For The Office: N/A  RN Note:  No appointments remain with Dr Kayla [name].  Scheduled for next open appointment.   Symptoms  Reason For Call & Symptoms: Felt stinging sensation with immediate swelling of middle phlanyx of 1st finger left hand when turning water off in home bathroom. Finger continues to be swollen with tiny "red spot" and bruising.   Finger is painful only when touched.  Reviewed Health History In EMR: Yes  Reviewed Medications In EMR: Yes  Reviewed Allergies In EMR: Yes  Reviewed Surgeries / Procedures: Yes  Date of Onset of Symptoms: 02/15/2013  Treatments Tried: elevated hand, swung hand around.  Treatments Tried Worked: No  Guideline(s) Used:  Finger Pain  Disposition Per Guideline:   See Within 3 Days in Office  Reason For Disposition Reached:   Swollen joint and no fever or redness  Advice Given:  Reassurance:  The symptoms you describe do not sound serious.  Call Back If:  Fever occurs  Redness or swelling appears  You become worse.  Patient Will Follow Care Advice:  YES  Appointment Scheduled:  02/16/2013 11:30:00 Appointment Scheduled Provider:  Sheliah [name].

## 2013-02-16 NOTE — Assessment & Plan Note (Signed)
New.  Suspect ligamentous injury/sprain of L 1st finger.  Encouraged ice, NSAIDs.  No need for immobilization at this time, if sxs persist >7-10 days, will refer to hand specialist.

## 2013-02-16 NOTE — Telephone Encounter (Signed)
Noted;   pt has an appt today

## 2013-05-07 ENCOUNTER — Other Ambulatory Visit: Payer: Self-pay

## 2013-05-07 MED ORDER — METOPROLOL TARTRATE 25 MG PO TABS: ORAL_TABLET | ORAL | Status: DC

## 2013-05-21 ENCOUNTER — Other Ambulatory Visit: Payer: Self-pay | Admitting: *Deleted

## 2013-05-21 NOTE — Telephone Encounter (Signed)
Received call from [place], [facility] at [facility] requesting script for Albuterol nebs. Verbal order given to fill

## 2013-05-25 ENCOUNTER — Encounter: Payer: Self-pay | Admitting: Internal Medicine

## 2013-06-26 ENCOUNTER — Ambulatory Visit (INDEPENDENT_AMBULATORY_CARE_PROVIDER_SITE_OTHER): Payer: Medicare Other | Admitting: Cardiology

## 2013-06-26 ENCOUNTER — Encounter: Payer: Self-pay | Admitting: Cardiology

## 2013-06-26 VITALS — BP 155/80 | HR 55 | Ht 62.5 in | Wt 160.0 lb

## 2013-06-26 DIAGNOSIS — I10 Essential (primary) hypertension: Secondary | ICD-10-CM

## 2013-06-26 DIAGNOSIS — R002 Palpitations: Secondary | ICD-10-CM

## 2013-06-26 MED ORDER — METOPROLOL TARTRATE 25 MG PO TABS: ORAL_TABLET | ORAL | Status: DC

## 2013-06-26 NOTE — Progress Notes (Signed)
HPI The patient presents for evaluation of palpitations.  She has had a -21 day event monitor in the past. In addition she has had normal coronaries on CT.  Since I last saw her she has done well.  The patient denies any new symptoms such as chest discomfort, neck or arm discomfort. There has been no new shortness of breath, PND or orthopnea. There have been no reported palpitations, presyncope or syncope.  She walks outside and at the [facility].  She has no symptoms with this.   Allergies  Allergen Reactions  . Cephalosporins     Blisters  orally  . Gabapentin     REACTION: rash  . Levofloxacin     REACTION: stomach ache and rash  . Psyllium     REACTION: rash  . Sulfonamide Derivatives     REACTION: shock, urticaria  . Aspirin     REACTION: stomach pain  . Belladonna   . Ciprofloxacin Hcl   . Conjugated Estrogens   . Nabumetone   . Nsaids   . Zicam Cold Remedy [Erysidoron #1]   . Prednisone     NO PROBLEM WITH MEDROL DOSE PAK    Current Outpatient Prescriptions  Medication Sig Dispense Refill  . acetaminophen (TYLENOL) 500 MG tablet Take 500 mg by mouth as needed.      Marland Kitchen albuterol (PROVENTIL HFA;VENTOLIN HFA) 108 (90 BASE) MCG/ACT inhaler Inhale 2 puffs into the lungs. As needed only      . albuterol (PROVENTIL) (2.5 MG/3ML) 0.083% nebulizer solution Take 2.5 mg by nebulization every 6 (six) hours as needed.      . bifidobacterium infantis (ALIGN) capsule Take 1 capsule by mouth daily.  21 capsule  0  . cetirizine (ZYRTEC) 10 MG tablet Take 10 mg by mouth as needed.       . Cholecalciferol (VITAMIN D3) 2000 UNITS TABS Take by mouth daily.      Marland Kitchen EPINEPHrine (EPI-PEN) 0.3 mg/0.3 mL DEVI Inject 0.3 mLs (0.3 mg total) into the muscle once.  1 Device  0  . esomeprazole (NEXIUM) 40 MG capsule Take 40 mg by mouth as needed. She uses prn to alternate between this and the omeprazole      . fluticasone (FLONASE) 50 MCG/ACT nasal spray USE TWO SPRAYS IN EACH NOSTRIL EVERY DAY  16 g  5  .  GuaiFENesin (MUCINEX PO) Take by mouth as needed.       . hyoscyamine (LEVSIN SL) 0.125 MG SL tablet Place 0.125 mg under the tongue every 6 (six) hours as needed. 1 under tongue every 6 hrs prn for abdominal pain       . latanoprost (XALATAN) 0.005 % ophthalmic solution Place 1 drop into both eyes at bedtime.        . Magnesium 250 MG TABS Take 1 tablet by mouth daily.       . metoprolol tartrate (LOPRESSOR) 25 MG tablet TAKE ONE TABLET BY MOUTH TWICE DAILY **TAKE  ONE  EXTRA  TABLET  AS  NEEDED**  90 tablet  0  . montelukast (SINGULAIR) 10 MG tablet TAKE ONE TABLET BY MOUTH EVERY DAY  90 tablet  3  . omeprazole (PRILOSEC) 40 MG capsule Take 1 capsule (40 mg total) by mouth daily.  90 capsule  3  . ranitidine (ZANTAC) 150 MG tablet Take 150 mg by mouth at bedtime.       . traMADol (ULTRAM) 50 MG tablet Take 1 tablet (50 mg total) by mouth every 6 (six)  hours as needed.  40 tablet  0   No current facility-administered medications for this visit.    Past Medical History  Diagnosis Date  . Asthma   . GERD (gastroesophageal reflux disease)     gastric polyp x3  . Osteopenia     last 07/2011  . Fibromyalgia   . Glaucoma      Dr Hazle Quant  . IBS (irritable bowel syndrome)   . Diverticulosis   . Pneumonia      OP as child  . Diverticulitis   . Fasting hyperglycemia 04/2011    FBS 108  . Fundic gland polyps of stomach, benign   . CAP (community acquired pneumonia) 08/2012    Dr Beverely Low    Past Surgical History  Procedure Laterality Date  . Spine surgery      T9-L5 fusions  . Colonoscopy w/ polypectomy  [phone]    last  colonoscopy 2007, Dr Leone Payor  . Esophagogastroduodenoscopy      ROS:  Otherwise as stated in the HPI and negative for all other systems.  PHYSICAL EXAM BP 155/80  Pulse 55  Ht 5' 2.5" (1.588 m)  Wt 160 lb (72.576 kg)  BMI 28.78 kg/m2 GENERAL:  Well appearing NECK:  No jugular venous distention, waveform within normal limits, carotid upstroke brisk and  symmetric, no bruits, no thyromegaly LYMPHATICS:  No cervical, inguinal adenopathy LUNGS:  Clear to auscultation bilaterally BACK:  No CVA tenderness HEART:  PMI not displaced or sustained,S1 and S2 within normal limits, no S3, no S4, no clicks, no rubs, no murmurs ABD:  Flat, positive bowel sounds normal in frequency in pitch, no bruits, no rebound, no guarding, no midline pulsatile mass, no hepatomegaly, no splenomegaly EXT:  2 plus pulses throughout, no edema, no cyanosis no clubbing  Sinus rhythm, rate 55, axis within normal limits, intervals within normal limits, no acute ST-T wave changes.  06/26/2013  ASSESSMENT AND PLAN  CHEST PAIN:   The patient has had no further symptoms. No further cardiovascular testing is suggested.  PALPITATIONS:   She is not bothered by these. Again no further cardiovascular testing is suggested.  HTN:   The blood pressure is slightly elevated today but this is unusual. She will continue the meds as listed.  She can follow up with [place] as needed.

## 2013-06-26 NOTE — Patient Instructions (Signed)
The current medical regimen is effective;  continue present plan and medications.  Follow up as needed with Dr Hochrein 

## 2013-07-03 ENCOUNTER — Other Ambulatory Visit: Payer: Self-pay | Admitting: Internal Medicine

## 2013-07-03 NOTE — Telephone Encounter (Signed)
Med filled.  

## 2013-08-12 ENCOUNTER — Ambulatory Visit: Payer: Medicare Other

## 2013-08-12 ENCOUNTER — Telehealth: Payer: Self-pay | Admitting: *Deleted

## 2013-08-12 NOTE — Telephone Encounter (Signed)
Called patient and reschedule nurse visit appointment because shingles vaccine was not in. Next appointment is 08/19/2013 @ 2pm.

## 2013-08-19 ENCOUNTER — Ambulatory Visit (INDEPENDENT_AMBULATORY_CARE_PROVIDER_SITE_OTHER): Payer: Medicare Other

## 2013-08-19 DIAGNOSIS — Z23 Encounter for immunization: Secondary | ICD-10-CM

## 2013-08-19 DIAGNOSIS — Z2911 Encounter for prophylactic immunotherapy for respiratory syncytial virus (RSV): Secondary | ICD-10-CM

## 2013-09-22 ENCOUNTER — Other Ambulatory Visit: Payer: Self-pay

## 2013-09-22 MED ORDER — OMEPRAZOLE 40 MG PO CPDR: 40.0000 mg | DELAYED_RELEASE_CAPSULE | Freq: Every day | ORAL | Status: DC

## 2013-09-22 MED ORDER — METOPROLOL TARTRATE 25 MG PO TABS: ORAL_TABLET | ORAL | Status: DC

## 2013-09-23 ENCOUNTER — Ambulatory Visit (INDEPENDENT_AMBULATORY_CARE_PROVIDER_SITE_OTHER): Payer: Medicare HMO | Admitting: Physician Assistant

## 2013-09-23 ENCOUNTER — Encounter: Payer: Self-pay | Admitting: Physician Assistant

## 2013-09-23 ENCOUNTER — Other Ambulatory Visit: Payer: Self-pay | Admitting: *Deleted

## 2013-09-23 VITALS — BP 150/90 | HR 64 | Temp 98.4°F | Resp 14 | Ht 62.5 in | Wt 163.5 lb

## 2013-09-23 DIAGNOSIS — B029 Zoster without complications: Secondary | ICD-10-CM | POA: Insufficient documentation

## 2013-09-23 DIAGNOSIS — I10 Essential (primary) hypertension: Secondary | ICD-10-CM

## 2013-09-23 MED ORDER — FLUTICASONE PROPIONATE 50 MCG/ACT NA SUSP: NASAL | Status: DC

## 2013-09-23 MED ORDER — MONTELUKAST SODIUM 10 MG PO TABS: ORAL_TABLET | ORAL | Status: DC

## 2013-09-23 MED ORDER — VALACYCLOVIR HCL 1 G PO TABS: 1000.0000 mg | ORAL_TABLET | Freq: Three times a day (TID) | ORAL | Status: DC

## 2013-09-23 NOTE — Assessment & Plan Note (Signed)
Rx valtrex.  Patient instructed on care of rash.  Instructed that shingles virus is contagious and to keep area covered.

## 2013-09-23 NOTE — Progress Notes (Signed)
Patient presents to clinic today c/o a couple days of a burning, pruritic rash of her R hip.  Patient has a history of shingles.  States the sensation feels similar to when she had shingles before.  Denies vesicle or blister.  Denies rash elsewhere.  Patient had the Zostavax shot in December of 2014.  Denies fever, chills, malaise/fatigue.  Denies drainage from site.  Of note, Patient's BP is elevated at 150/90s.  Endorses taking medications as prescribed.  States her BP at home runs in the 120s/80s.  Denies chest pain, palpitations, HA, vision changes.    Past Medical History  Diagnosis Date  . Asthma   . GERD (gastroesophageal reflux disease)     gastric polyp x3  . Osteopenia     last 07/2011  . Fibromyalgia   . Glaucoma      Dr Bing Plume  . IBS (irritable bowel syndrome)   . Diverticulosis   . Pneumonia      OP as child  . Diverticulitis   . Fasting hyperglycemia 04/2011    FBS 108  . Fundic gland polyps of stomach, benign   . CAP (community acquired pneumonia) 08/2012    Dr Birdie Riddle    Current Outpatient Prescriptions on File Prior to Visit  Medication Sig Dispense Refill  . acetaminophen (TYLENOL) 500 MG tablet Take 500 mg by mouth as needed.      Marland Kitchen albuterol (PROVENTIL HFA;VENTOLIN HFA) 108 (90 BASE) MCG/ACT inhaler Inhale 2 puffs into the lungs. As needed only      . albuterol (PROVENTIL) (2.5 MG/3ML) 0.083% nebulizer solution Take 2.5 mg by nebulization every 6 (six) hours as needed.      . bifidobacterium infantis (ALIGN) capsule Take 1 capsule by mouth daily.  21 capsule  0  . cetirizine (ZYRTEC) 10 MG tablet Take 10 mg by mouth as needed.       . Cholecalciferol (VITAMIN D3) 2000 UNITS TABS Take by mouth daily.      Marland Kitchen EPINEPHrine (EPI-PEN) 0.3 mg/0.3 mL DEVI Inject 0.3 mLs (0.3 mg total) into the muscle once.  1 Device  0  . esomeprazole (NEXIUM) 40 MG capsule Take 40 mg by mouth as needed. She uses prn to alternate between this and the omeprazole      . fluticasone  (FLONASE) 50 MCG/ACT nasal spray INSTILL 2 SPRAYS IN EACH NOSTRIL ONCE DAILY  16 g  4  . GuaiFENesin (MUCINEX PO) Take by mouth as needed.       . hyoscyamine (LEVSIN SL) 0.125 MG SL tablet Place 0.125 mg under the tongue every 6 (six) hours as needed. 1 under tongue every 6 hrs prn for abdominal pain       . latanoprost (XALATAN) 0.005 % ophthalmic solution Place 1 drop into both eyes at bedtime.        . Magnesium 250 MG TABS Take 1 tablet by mouth daily.       . metoprolol tartrate (LOPRESSOR) 25 MG tablet TAKE ONE TABLET BY MOUTH TWICE DAILY **TAKE  ONE  EXTRA  TABLET  AS  NEEDED**  270 tablet  4  . montelukast (SINGULAIR) 10 MG tablet TAKE ONE TABLET BY MOUTH EVERY DAY  90 tablet  3  . omeprazole (PRILOSEC) 40 MG capsule Take 1 capsule (40 mg total) by mouth daily.  90 capsule  0  . ranitidine (ZANTAC) 150 MG tablet Take 150 mg by mouth at bedtime.       . traMADol (ULTRAM) 50 MG tablet Take  1 tablet (50 mg total) by mouth every 6 (six) hours as needed.  40 tablet  0   No current facility-administered medications on file prior to visit.    Allergies  Allergen Reactions  . Cephalosporins     Blisters  orally  . Gabapentin     REACTION: rash  . Levofloxacin     REACTION: stomach ache and rash  . Psyllium     REACTION: rash  . Sulfonamide Derivatives     REACTION: shock, urticaria  . Aspirin     REACTION: stomach pain  . Belladonna   . Ciprofloxacin Hcl   . Conjugated Estrogens   . Nabumetone   . Nsaids   . Zicam Cold Remedy [Erysidoron #1]   . Prednisone     NO PROBLEM WITH MEDROL DOSE PAK    Family History  Problem Relation Age of Onset  . Breast cancer Mother   . Breast cancer Sister   . Breast cancer Maternal Aunt     two  . Breast cancer Paternal Aunt   . Asthma Paternal Aunt   . Heart failure Father     CHF  . Kidney disease Maternal Grandmother   . Ovarian cancer      Niece x2  . Coronary artery disease Paternal Aunt     triple CABG  . Heart attack  Paternal Grandmother     MI in late 71s  . Heart disease Paternal Grandfather   . Heart attack Paternal Uncle      MI in 26s  . Heart disease Maternal Aunt   . Diabetes Neg Hx   . Stroke Neg Hx   . COPD Neg Hx   . Breast cancer      niece    History   Social History  . Marital Status: Married    Spouse Name: N/A    Number of Children: N/A  . Years of Education: N/A   Social History Main Topics  . Smoking status: Former Smoker -- 0.50 packs/day for 10 years    Types: Cigarettes    Quit date: 09/10/1974  . Smokeless tobacco: Never Used     Comment: smoked 1966- 1976, up to 1 ppd  . Alcohol Use: No  . Drug Use: No  . Sexual Activity: None   Other Topics Concern  . None   Social History Narrative   Daily caffeine 3 cups   Regular exercise   Married         Review of Systems - See HPI.  All other ROS are negative.  Filed Vitals:   09/23/13 1326  BP: 150/90  Pulse: 64  Temp: 98.4 F (36.9 C)  Resp: 14   Physical Exam  Vitals reviewed. Constitutional: She is oriented to person, place, and time and well-developed, well-nourished, and in no distress.  HENT:  Head: Normocephalic and atraumatic.  Eyes: Conjunctivae are normal.  Neck: Neck supple.  Cardiovascular: Normal rate, regular rhythm and normal heart sounds.   Pulmonary/Chest: Effort normal and breath sounds normal.  Lymphadenopathy:    She has no cervical adenopathy.  Neurological: She is alert and oriented to person, place, and time.  Skin: Skin is warm and dry.  Presence of an erythematous, papular rash w/ beginning of vesicles noted on R hip, following a dermatomal distribution.  Rash does not cross midline.    No results found for this or any previous visit (from the past 2160 hour(s)).  Assessment/Plan: No problem-specific assessment & plan notes found for  this encounter.

## 2013-09-23 NOTE — Progress Notes (Signed)
Pre visit review using our clinic review tool, if applicable. No additional management support is needed unless otherwise documented below in the visit note/SLS  

## 2013-09-23 NOTE — Patient Instructions (Signed)
Take Valtrex as prescribed.  Avoid lotions or tight-fitting pants on the area.  Keep area clean and patted dry.  You are contagious if/when you have blisters on the skin.  Keep area covered and avoid contact with the elderly or young.

## 2013-09-23 NOTE — Assessment & Plan Note (Signed)
BP elevated in clinic today. Patient asymptomatic.  Patient instructed to take medicatons as prescribed and monito BP at home.  Follow-up with PCP in 2 weeks for BP recheck.

## 2013-09-24 ENCOUNTER — Telehealth: Payer: Self-pay | Admitting: Internal Medicine

## 2013-09-24 NOTE — Telephone Encounter (Signed)
Relevant patient education assigned to patient using Emmi. ° °

## 2013-11-11 ENCOUNTER — Telehealth: Payer: Self-pay | Admitting: Internal Medicine

## 2013-11-11 NOTE — Telephone Encounter (Signed)
Patient called and stated that she needs a referral to see DR Carlean Purl (GI) before she makes her apt at the end of march. Patient is seeing him for medication refill.  [place]umn[place] id #[id number]

## 2013-11-24 ENCOUNTER — Encounter: Payer: Self-pay | Admitting: Family Medicine

## 2013-11-24 ENCOUNTER — Ambulatory Visit (INDEPENDENT_AMBULATORY_CARE_PROVIDER_SITE_OTHER): Payer: Medicare HMO | Admitting: Family Medicine

## 2013-11-24 VITALS — BP 120/72 | HR 89 | Temp 99.1°F | Resp 16 | Wt 165.0 lb

## 2013-11-24 DIAGNOSIS — N390 Urinary tract infection, site not specified: Secondary | ICD-10-CM

## 2013-11-24 DIAGNOSIS — R300 Dysuria: Secondary | ICD-10-CM

## 2013-11-24 LAB — POCT URINALYSIS DIPSTICK
Bilirubin, UA: NEGATIVE
Blood, UA: NEGATIVE
GLUCOSE UA: NEGATIVE
Ketones, UA: NEGATIVE
NITRITE UA: POSITIVE
Protein, UA: NEGATIVE
UROBILINOGEN UA: 0.2
pH, UA: 5

## 2013-11-24 MED ORDER — NITROFURANTOIN MONOHYD MACRO 100 MG PO CAPS: 100.0000 mg | ORAL_CAPSULE | Freq: Two times a day (BID) | ORAL | Status: DC

## 2013-11-24 NOTE — Progress Notes (Signed)
Pre visit review using our clinic review tool, if applicable. No additional management support is needed unless otherwise documented below in the visit note. 

## 2013-11-24 NOTE — Progress Notes (Signed)
  Subjective:    Kayla [name] is a 68 y.o. female who complains of frequency, suprapubic pressure and urgency. She has had symptoms for 1 week. Patient also complains of stomach ache. Patient denies back pain, congestion, cough, fever, headache, rhinitis, sorethroat and vaginal discharge. Patient does not have a history of recurrent UTI. Patient does not have a history of pyelonephritis.   The following portions of the patient's history were reviewed and updated as appropriate: allergies, current medications, past family history, past medical history, past social history, past surgical history and problem list.  Review of Systems Pertinent items are noted in HPI.    Objective:    BP 120/72  Pulse 89  Temp(Src) 99.1 F (37.3 C) (Oral)  Resp 16  Wt 165 lb (74.844 kg) General appearance: alert, cooperative, appears stated age and no distress Abdomen: soft, non-tender; bowel sounds normal; no masses,  no organomegaly  Laboratory:  Urine dipstick: 3+ for leukocyte esterase and pos for nitrites.   Micro exam: not done.    Assessment:    Acute cystitis and UTI     Plan:    Medications: nitrofurantoin. Maintain adequate hydration. Follow up if symptoms not improving, and as needed.

## 2013-11-24 NOTE — Patient Instructions (Signed)
Urinary Tract Infection  Urinary tract infections (UTIs) can develop anywhere along your urinary tract. Your urinary tract is your body's drainage system for removing wastes and extra water. Your urinary tract includes two kidneys, two ureters, a bladder, and a urethra. Your kidneys are a pair of bean-shaped organs. Each kidney is about the size of your fist. They are located below your ribs, one on each side of your spine.  CAUSES  Infections are caused by microbes, which are microscopic organisms, including fungi, viruses, and bacteria. These organisms are so small that they can only be seen through a microscope. Bacteria are the microbes that most commonly cause UTIs.  SYMPTOMS   Symptoms of UTIs may vary by age and gender of the patient and by the location of the infection. Symptoms in young women typically include a frequent and intense urge to urinate and a painful, burning feeling in the bladder or urethra during urination. Older women and men are more likely to be tired, shaky, and weak and have muscle aches and abdominal pain. A fever may mean the infection is in your kidneys. Other symptoms of a kidney infection include pain in your back or sides below the ribs, nausea, and vomiting.  DIAGNOSIS  To diagnose a UTI, your caregiver will ask you about your symptoms. Your caregiver also will ask to provide a urine sample. The urine sample will be tested for bacteria and white blood cells. White blood cells are made by your body to help fight infection.  TREATMENT   Typically, UTIs can be treated with medication. Because most UTIs are caused by a bacterial infection, they usually can be treated with the use of antibiotics. The choice of antibiotic and length of treatment depend on your symptoms and the type of bacteria causing your infection.  HOME CARE INSTRUCTIONS   If you were prescribed antibiotics, take them exactly as your caregiver instructs you. Finish the medication even if you feel better after you  have only taken some of the medication.   Drink enough water and fluids to keep your urine clear or pale yellow.   Avoid caffeine, tea, and carbonated beverages. They tend to irritate your bladder.   Empty your bladder often. Avoid holding urine for long periods of time.   Empty your bladder before and after sexual intercourse.   After a bowel movement, women should cleanse from front to back. Use each tissue only once.  SEEK MEDICAL CARE IF:    You have back pain.   You develop a fever.   Your symptoms do not begin to resolve within 3 days.  SEEK IMMEDIATE MEDICAL CARE IF:    You have severe back pain or lower abdominal pain.   You develop chills.   You have nausea or vomiting.   You have continued burning or discomfort with urination.  MAKE SURE YOU:    Understand these instructions.   Will watch your condition.   Will get help right away if you are not doing well or get worse.  Document Released: 06/06/2005 Document Revised: 02/26/2012 Document Reviewed: 10/05/2011  Exit[employer] Patient Information 2014 [employer].

## 2013-11-26 LAB — URINE CULTURE

## 2013-12-10 ENCOUNTER — Other Ambulatory Visit: Payer: Self-pay | Admitting: Internal Medicine

## 2013-12-10 ENCOUNTER — Telehealth: Payer: Self-pay | Admitting: Internal Medicine

## 2013-12-10 MED ORDER — OMEPRAZOLE 40 MG PO CPDR: 40.0000 mg | DELAYED_RELEASE_CAPSULE | Freq: Every day | ORAL | Status: DC

## 2013-12-10 NOTE — Telephone Encounter (Signed)
Refill sent in as requested. 

## 2014-01-08 ENCOUNTER — Telehealth: Payer: Self-pay

## 2014-01-08 NOTE — Telephone Encounter (Signed)
Medication and allergies:  Reviewed and updated  90 day supply/mail order: [facility] Local pharmacy:  [id number] - [place], [place] - [address]   Immunizations due:  UTD   A/P: No changes to personal, family history or past surgical hx PAP- 115/6/12- negative; would like a pap smear with this visit [facility]- 02/07/06- diverticulosis; repeat in 10 years [facility]- 08/03/13- negative BD- 07/17/11- osteopenia Flu- 05/26/13 Tdap- 03/07/06 PNA- 07/12/11 Shingles- 08/19/13 To Discuss with Provider:  Patient needs nebulizer supplies for her nebulizer machine:  Hand held nebulizer, tubing, and filter.  She has a [employer] EVO machine.

## 2014-01-11 ENCOUNTER — Encounter: Payer: Self-pay | Admitting: Family Medicine

## 2014-01-11 ENCOUNTER — Ambulatory Visit (INDEPENDENT_AMBULATORY_CARE_PROVIDER_SITE_OTHER): Payer: Commercial Managed Care - HMO | Admitting: Family Medicine

## 2014-01-11 ENCOUNTER — Other Ambulatory Visit (HOSPITAL_COMMUNITY)
Admission: RE | Admit: 2014-01-11 | Discharge: 2014-01-11 | Disposition: A | Discharge status: Home / Self Care | Payer: Medicare HMO | Source: Ambulatory Visit | Attending: Family Medicine | Admitting: Family Medicine

## 2014-01-11 VITALS — BP 128/72 | HR 58 | Temp 98.7°F | Ht 62.75 in | Wt 163.0 lb

## 2014-01-11 DIAGNOSIS — Z124 Encounter for screening for malignant neoplasm of cervix: Secondary | ICD-10-CM

## 2014-01-11 DIAGNOSIS — I10 Essential (primary) hypertension: Secondary | ICD-10-CM

## 2014-01-11 DIAGNOSIS — J309 Allergic rhinitis, unspecified: Secondary | ICD-10-CM

## 2014-01-11 DIAGNOSIS — Z1151 Encounter for screening for human papillomavirus (HPV): Secondary | ICD-10-CM | POA: Diagnosis present

## 2014-01-11 DIAGNOSIS — Z0000 Encounter for general adult medical examination without abnormal findings: Secondary | ICD-10-CM

## 2014-01-11 DIAGNOSIS — Z23 Encounter for immunization: Secondary | ICD-10-CM

## 2014-01-11 DIAGNOSIS — K219 Gastro-esophageal reflux disease without esophagitis: Secondary | ICD-10-CM

## 2014-01-11 DIAGNOSIS — J302 Other seasonal allergic rhinitis: Secondary | ICD-10-CM

## 2014-01-11 DIAGNOSIS — E785 Hyperlipidemia, unspecified: Secondary | ICD-10-CM

## 2014-01-11 DIAGNOSIS — H409 Unspecified glaucoma: Secondary | ICD-10-CM

## 2014-01-11 LAB — CBC WITH DIFFERENTIAL/PLATELET
BASOS ABS: 0 10*3/uL (ref 0.0–0.1)
Basophils Relative: 0.5 % (ref 0.0–3.0)
EOS PCT: 3 % (ref 0.0–5.0)
Eosinophils Absolute: 0.1 10*3/uL (ref 0.0–0.7)
HEMATOCRIT: 39 % (ref 36.0–46.0)
Hemoglobin: 13.2 g/dL (ref 12.0–15.0)
LYMPHS PCT: 16.4 % (ref 12.0–46.0)
Lymphs Abs: 0.6 10*3/uL — ABNORMAL LOW (ref 0.7–4.0)
MCHC: 33.7 g/dL (ref 30.0–36.0)
MCV: 88.9 fl (ref 78.0–100.0)
Monocytes Absolute: 0.4 10*3/uL (ref 0.1–1.0)
Monocytes Relative: 10.4 % (ref 3.0–12.0)
NEUTROS PCT: 69.7 % (ref 43.0–77.0)
Neutro Abs: 2.7 10*3/uL (ref 1.4–7.7)
PLATELETS: 173 10*3/uL (ref 150.0–400.0)
RBC: 4.39 Mil/uL (ref 3.87–5.11)
RDW: 13.5 % (ref 11.5–14.6)
WBC: 3.9 10*3/uL — ABNORMAL LOW (ref 4.5–10.5)

## 2014-01-11 LAB — LIPID PANEL
CHOLESTEROL: 214 mg/dL — AB (ref 0–200)
HDL: 61.3 mg/dL (ref >39.00)
LDL CALC: 133 mg/dL — AB (ref 0–99)
Total CHOL/HDL Ratio: 3
Triglycerides: 97 mg/dL (ref 0.0–149.0)
VLDL: 19.4 mg/dL (ref 0.0–40.0)

## 2014-01-11 LAB — POCT URINALYSIS DIPSTICK
Bilirubin, UA: NEGATIVE
Blood, UA: NEGATIVE
Glucose, UA: NEGATIVE
KETONES UA: NEGATIVE
Leukocytes, UA: NEGATIVE
Nitrite, UA: NEGATIVE
Protein, UA: NEGATIVE
Spec Grav, UA: <=1.005
UROBILINOGEN UA: 0.2
pH, UA: 7

## 2014-01-11 LAB — HEPATIC FUNCTION PANEL
ALK PHOS: 88 U/L (ref 39–117)
ALT: 24 U/L (ref 0–35)
AST: 28 U/L (ref 0–37)
Albumin: 4.3 g/dL (ref 3.5–5.2)
BILIRUBIN DIRECT: 0 mg/dL (ref 0.0–0.3)
BILIRUBIN TOTAL: 0.5 mg/dL (ref 0.2–1.2)
Total Protein: 7 g/dL (ref 6.0–8.3)

## 2014-01-11 LAB — BASIC METABOLIC PANEL
BUN: 11 mg/dL (ref 6–23)
CALCIUM: 9.4 mg/dL (ref 8.4–10.5)
CO2: 27 mEq/L (ref 19–32)
Chloride: 101 mEq/L (ref 96–112)
Creatinine, Ser: 0.6 mg/dL (ref 0.4–1.2)
GFR: 103.56 mL/min (ref >60.00)
GLUCOSE: 80 mg/dL (ref 70–99)
Potassium: 4 mEq/L (ref 3.5–5.1)
Sodium: 137 mEq/L (ref 135–145)

## 2014-01-11 MED ORDER — FLUTICASONE PROPIONATE 50 MCG/ACT NA SUSP: NASAL | Status: DC

## 2014-01-11 NOTE — Progress Notes (Signed)
Subjective:    Kayla [name] is a 68 y.o. female who presents for Medicare Annual/Subsequent preventive examination.  Preventive Screening-Counseling & Management  Tobacco History  Smoking status  . Former Smoker -- 0.50 packs/day for 10 years  . Types: Cigarettes  . Quit date: 09/10/1974  Smokeless tobacco  . Never Used    Comment: smoked 1966- 1976, up to 1 ppd     Problems Prior to Visit 1.   Current Problems (verified) Patient Active Problem List   Diagnosis Date Noted  . Herpes zoster 09/23/2013  . Superficial bruising of finger 02/16/2013  . Hyperlipidemia 11/01/2011  . Palpitations 05/23/2011  . Hypertension 04/23/2011  . Cough 04/09/2011  . ILEITIS 07/19/2010  . THYROID FUNCTION TEST, ABNORMAL 12/20/2009  . COLONIC POLYPS, HX OF 11/08/2008  . ASTHMA 01/21/2008  . GERD 01/21/2008  . DIVERTICULOSIS 01/21/2008  . IRRITABLE BOWEL SYNDROME 01/21/2008  . FIBROMYALGIA 01/21/2008  . DEFICIENCY, VITAMIN D NOS 04/02/2007  . DEGENERATIVE DISC DISEASE 04/02/2007  . GLAUCOMA, LOW TENSION 02/06/2007  . OSTEOPENIA 02/06/2007    Medications Prior to Visit Current Outpatient Prescriptions on File Prior to Visit  Medication Sig Dispense Refill  . acetaminophen (TYLENOL) 500 MG tablet Take 500 mg by mouth as needed.      Marland Kitchen albuterol (PROVENTIL HFA;VENTOLIN HFA) 108 (90 BASE) MCG/ACT inhaler Inhale 2 puffs into the lungs. As needed only      . albuterol (PROVENTIL) (2.5 MG/3ML) 0.083% nebulizer solution Take 2.5 mg by nebulization every 6 (six) hours as needed.      . bifidobacterium infantis (ALIGN) capsule Take 1 capsule by mouth daily.  21 capsule  0  . cetirizine (ZYRTEC) 10 MG tablet Take 10 mg by mouth as needed.       . Cholecalciferol (VITAMIN D3) 2000 UNITS TABS Take by mouth daily.      Marland Kitchen EPINEPHrine (EPI-PEN) 0.3 mg/0.3 mL DEVI Inject 0.3 mLs (0.3 mg total) into the muscle once.  1 Device  0  . esomeprazole (NEXIUM) 40 MG capsule Take 40 mg by mouth as needed.  She uses prn to alternate between this and the omeprazole      . GuaiFENesin (MUCINEX PO) Take by mouth as needed.       . hyoscyamine (LEVSIN SL) 0.125 MG SL tablet Place 0.125 mg under the tongue every 6 (six) hours as needed. 1 under tongue every 6 hrs prn for abdominal pain       . latanoprost (XALATAN) 0.005 % ophthalmic solution Place 1 drop into both eyes at bedtime.        . Magnesium 250 MG TABS Take 1 tablet by mouth daily.       . metoprolol tartrate (LOPRESSOR) 25 MG tablet TAKE ONE TABLET BY MOUTH TWICE DAILY **TAKE  ONE  EXTRA  TABLET  AS  NEEDED**  270 tablet  4  . montelukast (SINGULAIR) 10 MG tablet TAKE ONE TABLET BY MOUTH EVERY DAY  90 tablet  3  . nystatin-triamcinolone (MYCOLOG II) cream Apply 1 application topically as needed.      Marland Kitchen omeprazole (PRILOSEC) 40 MG capsule Take 1 capsule (40 mg total) by mouth daily.  90 capsule  0  . ranitidine (ZANTAC) 150 MG tablet Take 150 mg by mouth at bedtime.       . traMADol (ULTRAM) 50 MG tablet Take 1 tablet (50 mg total) by mouth every 6 (six) hours as needed.  40 tablet  0   No current facility-administered medications on  file prior to visit.    Current Medications (verified) Current Outpatient Prescriptions  Medication Sig Dispense Refill  . acetaminophen (TYLENOL) 500 MG tablet Take 500 mg by mouth as needed.      Marland Kitchen albuterol (PROVENTIL HFA;VENTOLIN HFA) 108 (90 BASE) MCG/ACT inhaler Inhale 2 puffs into the lungs. As needed only      . albuterol (PROVENTIL) (2.5 MG/3ML) 0.083% nebulizer solution Take 2.5 mg by nebulization every 6 (six) hours as needed.      . bifidobacterium infantis (ALIGN) capsule Take 1 capsule by mouth daily.  21 capsule  0  . cetirizine (ZYRTEC) 10 MG tablet Take 10 mg by mouth as needed.       . Cholecalciferol (VITAMIN D3) 2000 UNITS TABS Take by mouth daily.      Marland Kitchen EPINEPHrine (EPI-PEN) 0.3 mg/0.3 mL DEVI Inject 0.3 mLs (0.3 mg total) into the muscle once.  1 Device  0  . esomeprazole (NEXIUM) 40 MG  capsule Take 40 mg by mouth as needed. She uses prn to alternate between this and the omeprazole      . fluticasone (FLONASE) 50 MCG/ACT nasal spray INSTILL 2 SPRAYS IN EACH NOSTRIL ONCE DAILY  48 g  3  . GuaiFENesin (MUCINEX PO) Take by mouth as needed.       . hyoscyamine (LEVSIN SL) 0.125 MG SL tablet Place 0.125 mg under the tongue every 6 (six) hours as needed. 1 under tongue every 6 hrs prn for abdominal pain       . latanoprost (XALATAN) 0.005 % ophthalmic solution Place 1 drop into both eyes at bedtime.        . Magnesium 250 MG TABS Take 1 tablet by mouth daily.       . metoprolol tartrate (LOPRESSOR) 25 MG tablet TAKE ONE TABLET BY MOUTH TWICE DAILY **TAKE  ONE  EXTRA  TABLET  AS  NEEDED**  270 tablet  4  . montelukast (SINGULAIR) 10 MG tablet TAKE ONE TABLET BY MOUTH EVERY DAY  90 tablet  3  . nystatin-triamcinolone (MYCOLOG II) cream Apply 1 application topically as needed.      Marland Kitchen omeprazole (PRILOSEC) 40 MG capsule Take 1 capsule (40 mg total) by mouth daily.  90 capsule  0  . ranitidine (ZANTAC) 150 MG tablet Take 150 mg by mouth at bedtime.       . traMADol (ULTRAM) 50 MG tablet Take 1 tablet (50 mg total) by mouth every 6 (six) hours as needed.  40 tablet  0   No current facility-administered medications for this visit.     Allergies (verified) Cephalosporins; Gabapentin; Levofloxacin; Psyllium; Sulfonamide derivatives; Aspirin; Belladonna; Ciprofloxacin hcl; Conjugated estrogens; Nabumetone; Nsaids; Zicam cold remedy; and Prednisone   PAST HISTORY  Family History Family History  Problem Relation Age of Onset  . Breast cancer Mother   . Breast cancer Sister   . Breast cancer Maternal Aunt     two  . Breast cancer Paternal Aunt   . Asthma Paternal Aunt   . Heart failure Father     CHF  . Kidney disease Maternal Grandmother   . Ovarian cancer      Niece x2  . Coronary artery disease Paternal Aunt     triple CABG  . Heart attack Paternal Grandmother     MI in late  59s  . Heart disease Paternal Grandfather   . Heart attack Paternal Uncle      MI in 53s  . Heart disease Maternal Aunt   .  Diabetes Neg Hx   . Stroke Neg Hx   . COPD Neg Hx   . Breast cancer      niece    Social History History  Substance Use Topics  . Smoking status: Former Smoker -- 0.50 packs/day for 10 years    Types: Cigarettes    Quit date: 09/10/1974  . Smokeless tobacco: Never Used     Comment: smoked 1966- 1976, up to 1 ppd  . Alcohol Use: No     Are there smokers in your home (other than you)? No  Risk Factors Current exercise habits: walking  Dietary issues discussed: na   Cardiac risk factors: advanced age (older than 1 for men, 78 for women) and sedentary lifestyle.  Depression Screen (Note: if answer to either of the following is "Yes", a more complete depression screening is indicated)   Over the past two weeks, have you felt down, depressed or hopeless? No  Over the past two weeks, have you felt little interest or pleasure in doing things? No  Have you lost interest or pleasure in daily life? No  Do you often feel hopeless? No  Do you cry easily over simple problems? No  Activities of Daily Living In your present state of health, do you have any difficulty performing the following activities?:  Driving? No Managing money?  No Feeding yourself? No Getting from bed to chair? No Climbing a flight of stairs? No Preparing food and eating?: No Bathing or showering? No Getting dressed: No Getting to the toilet? No Using the toilet:No Moving around from place to place: No In the past year have you fallen or had a near fall?:No   Are you sexually active?  Yes  Do you have more than one partner?  No  Hearing Difficulties: No Do you often ask people to speak up or repeat themselves? No Do you experience ringing or noises in your ears? No Do you have difficulty understanding soft or whispered voices? No   Do you feel that you have a problem with  memory? No  Do you often misplace items? No  Do you feel safe at home?  Yes  Cognitive Testing  Alert? Yes  Normal Appearance?Yes  Oriented to person? Yes  Place? Yes   Time? Yes  Recall of three objects?  Yes  Can perform simple calculations? Yes  Displays appropriate judgment?Yes  Can read the correct time from a watch face?Yes   Advanced Directives have been discussed with the patient? Yes  List the Names of Other Physician/Practitioners you currently use: 1.  pulm-- wert 2  Cardio-- hochrein 3  oph--digby 4  Dentist--beshears  Indicate any recent Medical Services you may have received from other than [facility] providers in the past year (date may be approximate).  Immunization History  Administered Date(s) Administered  . Influenza Whole 06/14/2008, 06/09/2010, 07/06/2011  . Influenza, Seasonal, Injecte, Preservative Fre 05/26/2013  . Influenza-Unspecified 05/30/2012, 05/22/2013  . Pneumococcal Polysaccharide-23 07/12/2011  . Td 03/07/2006  . Zoster 08/19/2013    Screening Tests Health Maintenance  Topic Date Due  . Influenza Vaccine  04/10/2014  . Mammogram  08/04/2015  . Colonoscopy  02/08/2016  . Tetanus/tdap  03/07/2016  . Pneumococcal Polysaccharide Vaccine Age 34 And Over  Completed  . Zostavax  Completed    All answers were reviewed with the patient and necessary referrals were made:  Garnet Koyanagi, DO   01/11/2014   History reviewed:  She  has a past medical history of Asthma;  GERD (gastroesophageal reflux disease); Osteopenia; Fibromyalgia; Glaucoma; IBS (irritable bowel syndrome); Diverticulosis; Pneumonia; Diverticulitis; Fasting hyperglycemia (04/2011); Fundic gland polyps of stomach, benign; CAP (community acquired pneumonia) (08/2012); and Shingles. She  does not have any pertinent problems on file. She  has past surgical history that includes Spine surgery; Colonoscopy w/ polypectomy (4627,0350,0938); and Esophagogastroduodenoscopy. Her family history  includes Asthma in her paternal aunt; Breast cancer in her maternal aunt, mother, paternal aunt, sister, and another family member; Coronary artery disease in her paternal aunt; Heart attack in her paternal grandmother and paternal uncle; Heart disease in her maternal aunt and paternal grandfather; Heart failure in her father; Kidney disease in her maternal grandmother; Ovarian cancer in an other family member. There is no history of Diabetes, Stroke, or COPD. She  reports that she quit smoking about 39 years ago. Her smoking use included Cigarettes. She has a 5 pack-year smoking history. She has never used smokeless tobacco. She reports that she does not drink alcohol or use illicit drugs. She has a current medication list which includes the following prescription(s): acetaminophen, albuterol, albuterol, bifidobacterium infantis, cetirizine, vitamin d3, epinephrine, esomeprazole, fluticasone, guaifenesin, hyoscyamine, latanoprost, magnesium, metoprolol tartrate, montelukast, nystatin-triamcinolone, omeprazole, ranitidine, and tramadol. Current Outpatient Prescriptions on File Prior to Visit  Medication Sig Dispense Refill  . acetaminophen (TYLENOL) 500 MG tablet Take 500 mg by mouth as needed.      Marland Kitchen albuterol (PROVENTIL HFA;VENTOLIN HFA) 108 (90 BASE) MCG/ACT inhaler Inhale 2 puffs into the lungs. As needed only      . albuterol (PROVENTIL) (2.5 MG/3ML) 0.083% nebulizer solution Take 2.5 mg by nebulization every 6 (six) hours as needed.      . bifidobacterium infantis (ALIGN) capsule Take 1 capsule by mouth daily.  21 capsule  0  . cetirizine (ZYRTEC) 10 MG tablet Take 10 mg by mouth as needed.       . Cholecalciferol (VITAMIN D3) 2000 UNITS TABS Take by mouth daily.      Marland Kitchen EPINEPHrine (EPI-PEN) 0.3 mg/0.3 mL DEVI Inject 0.3 mLs (0.3 mg total) into the muscle once.  1 Device  0  . esomeprazole (NEXIUM) 40 MG capsule Take 40 mg by mouth as needed. She uses prn to alternate between this and the  omeprazole      . GuaiFENesin (MUCINEX PO) Take by mouth as needed.       . hyoscyamine (LEVSIN SL) 0.125 MG SL tablet Place 0.125 mg under the tongue every 6 (six) hours as needed. 1 under tongue every 6 hrs prn for abdominal pain       . latanoprost (XALATAN) 0.005 % ophthalmic solution Place 1 drop into both eyes at bedtime.        . Magnesium 250 MG TABS Take 1 tablet by mouth daily.       . metoprolol tartrate (LOPRESSOR) 25 MG tablet TAKE ONE TABLET BY MOUTH TWICE DAILY **TAKE  ONE  EXTRA  TABLET  AS  NEEDED**  270 tablet  4  . montelukast (SINGULAIR) 10 MG tablet TAKE ONE TABLET BY MOUTH EVERY DAY  90 tablet  3  . nystatin-triamcinolone (MYCOLOG II) cream Apply 1 application topically as needed.      Marland Kitchen omeprazole (PRILOSEC) 40 MG capsule Take 1 capsule (40 mg total) by mouth daily.  90 capsule  0  . ranitidine (ZANTAC) 150 MG tablet Take 150 mg by mouth at bedtime.       . traMADol (ULTRAM) 50 MG tablet Take 1 tablet (50 mg total) by  mouth every 6 (six) hours as needed.  40 tablet  0   No current facility-administered medications on file prior to visit.   She is allergic to cephalosporins; gabapentin; levofloxacin; psyllium; sulfonamide derivatives; aspirin; belladonna; ciprofloxacin hcl; conjugated estrogens; nabumetone; nsaids; zicam cold remedy; and prednisone.  Review of Systems  Review of Systems  Constitutional: Negative for activity change, appetite change and fatigue.  HENT: Negative for hearing loss, congestion, tinnitus and ear discharge.   Eyes: Negative for visual disturbance (see optho q1y -- vision corrected to 20/20 with glasses).  Respiratory: Negative for cough, chest tightness and shortness of breath.   Cardiovascular: Negative for chest pain, palpitations and leg swelling.  Gastrointestinal: Negative for abdominal pain, diarrhea, constipation and abdominal distention.  Genitourinary: Negative for urgency, frequency, decreased urine volume and difficulty urinating.   Musculoskeletal: Negative for back pain, arthralgias and gait problem.  Skin: Negative for color change, pallor and rash.  Neurological: Negative for dizziness, light-headedness, numbness and headaches.  Hematological: Negative for adenopathy. Does not bruise/bleed easily.  Psychiatric/Behavioral: Negative for suicidal ideas, confusion, sleep disturbance, self-injury, dysphoric mood, decreased concentration and agitation.  Pt is able to read and write and can do all ADLs No risk for falling No abuse/ violence in home      Objective:     Vision by Snellen chart: opth Body mass index is 29.1 kg/(m^2). BP 128/72  Pulse 58  Temp(Src) 98.7 F (37.1 C) (Oral)  Ht 5' 2.75" (1.594 m)  Wt 163 lb (73.936 kg)  BMI 29.10 kg/m2  SpO2 99%  BP 128/72  Pulse 58  Temp(Src) 98.7 F (37.1 C) (Oral)  Ht 5' 2.75" (1.594 m)  Wt 163 lb (73.936 kg)  BMI 29.10 kg/m2  SpO2 99% General appearance: alert, cooperative, appears stated age and no distress Head: Normocephalic, without obvious abnormality, atraumatic Eyes: conjunctivae/corneas clear. PERRL, EOM's intact. Fundi benign. Ears: normal TM's and external ear canals both ears Nose: Nares normal. Septum midline. Mucosa normal. No drainage or sinus tenderness. Throat: lips, mucosa, and tongue normal; teeth and gums normal Neck: no adenopathy, no carotid bruit, no JVD, supple, symmetrical, trachea midline and thyroid not enlarged, symmetric, no tenderness/mass/nodules Back: symmetric, no curvature. ROM normal. No CVA tenderness. Lungs: clear to auscultation bilaterally Breasts: normal appearance, no masses or tenderness Heart: regular rate and rhythm, S1, S2 normal, no murmur, click, rub or gallop Abdomen: soft, non-tender; bowel sounds normal; no masses,  no organomegaly Pelvic: cervix normal in appearance, external genitalia normal, no adnexal masses or tenderness, no cervical motion tenderness, rectovaginal septum normal, uterus normal size,  shape, and consistency, vagina normal without discharge and pap done Extremities: extremities normal, atraumatic, no cyanosis or edema Pulses: 2+ and symmetric Skin: Skin color, texture, turgor normal. No rashes or lesions Lymph nodes: Cervical, supraclavicular, and axillary nodes normal. Neurologic: Alert and oriented X 3, normal strength and tone. Normal symmetric reflexes. Normal coordination and gait     Assessment:     cpe       Plan:     During the course of the visit the patient was educated and counseled about appropriate screening and preventive services including:    Pneumococcal vaccine   Influenza vaccine  Screening mammography  Screening Pap smear and pelvic exam   Bone densitometry screening  Colorectal cancer screening  Diabetes screening  Glaucoma screening  Advanced directives: has an advanced directive - a copy HAS NOT been provided.  1. Seasonal allergies  - fluticasone (FLONASE) 50 MCG/ACT nasal spray;  INSTILL 2 SPRAYS IN EACH NOSTRIL ONCE DAILY  Dispense: 48 g; Refill: 3  2. HTN (hypertension) stable - Basic metabolic panel - CBC with Differential - POCT urinalysis dipstick  3. Hyperlipidemia  - Hepatic function panel - Lipid panel - POCT urinalysis dipstick  4. Glaucoma  - Ambulatory referral to Ophthalmology  5. GERD (gastroesophageal reflux disease)  - Ambulatory referral to Gastroenterology  6. Medicare annual wellness visit, subsequent    Diet review for nutrition referral? Yes ____  Not Indicated ____   Patient Instructions (the written plan) was given to the patient.  Medicare Attestation I have personally reviewed: The patient's medical and social history Their use of alcohol, tobacco or illicit drugs Their current medications and supplements The patient's functional ability including ADLs,fall risks, home safety risks, cognitive, and hearing and visual impairment Diet and physical activities Evidence for  depression or mood disorders  The patient's weight, height, BMI, and visual acuity have been recorded in the chart.  I have made referrals, counseling, and provided education to the patient based on review of the above and I have provided the patient with a written personalized care plan for preventive services.     Garnet Koyanagi, DO   01/11/2014

## 2014-01-11 NOTE — Addendum Note (Signed)
Addended by: Ewing Schlein on: 01/11/2014 11:39 AM   Modules accepted: Orders

## 2014-01-11 NOTE — Progress Notes (Signed)
Pre visit review using our clinic review tool, if applicable. No additional management support is needed unless otherwise documented below in the visit note. 

## 2014-01-11 NOTE — Patient Instructions (Signed)

## 2014-01-15 ENCOUNTER — Encounter: Payer: Self-pay | Admitting: Family Medicine

## 2014-01-29 ENCOUNTER — Other Ambulatory Visit (INDEPENDENT_AMBULATORY_CARE_PROVIDER_SITE_OTHER): Payer: Commercial Managed Care - HMO

## 2014-01-29 DIAGNOSIS — K219 Gastro-esophageal reflux disease without esophagitis: Secondary | ICD-10-CM

## 2014-01-29 DIAGNOSIS — Z0000 Encounter for general adult medical examination without abnormal findings: Secondary | ICD-10-CM

## 2014-01-29 DIAGNOSIS — Z124 Encounter for screening for malignant neoplasm of cervix: Secondary | ICD-10-CM

## 2014-01-29 LAB — FECAL OCCULT BLOOD, IMMUNOCHEMICAL: FECAL OCCULT BLD: NEGATIVE

## 2014-01-29 NOTE — Addendum Note (Signed)
Addended by: Modena Morrow D on: 01/29/2014 04:09 PM   Modules accepted: Orders

## 2014-02-03 ENCOUNTER — Encounter: Payer: Self-pay | Admitting: Internal Medicine

## 2014-02-03 ENCOUNTER — Ambulatory Visit (INDEPENDENT_AMBULATORY_CARE_PROVIDER_SITE_OTHER): Payer: Commercial Managed Care - HMO | Admitting: Internal Medicine

## 2014-02-03 VITALS — BP 130/70 | HR 60 | Ht 62.5 in | Wt 163.0 lb

## 2014-02-03 DIAGNOSIS — K589 Irritable bowel syndrome without diarrhea: Secondary | ICD-10-CM

## 2014-02-03 DIAGNOSIS — K219 Gastro-esophageal reflux disease without esophagitis: Secondary | ICD-10-CM

## 2014-02-03 MED ORDER — OMEPRAZOLE 40 MG PO CPDR
40.0000 mg | DELAYED_RELEASE_CAPSULE | Freq: Every day | ORAL | Status: DC
Start: 2014-02-03 — End: 2015-09-01

## 2014-02-03 NOTE — Patient Instructions (Signed)
Today you have been given samples of Nexium to use as needed.  We have sent the following medications to your pharmacy for you to pick up at your convenience: Omeprazole  Follow up with [place] as needed.  I appreciate the opportunity to care for you.

## 2014-02-03 NOTE — Progress Notes (Signed)
         Subjective:    Patient ID: Kayla [name], female    DOB: 1946/06/15, 68 y.o.   MRN: [id number]  HPI  The patient reports that current therapy of IBS and GERD is effective.  Medications, allergies, past medical history, past surgical history, family history and social history are reviewed and updated in the EMR.  Review of Systems As above    Objective:   Physical Exam WDWN NAD     Assessment & Plan:  GERD OK Refill omeprazole A few Nexium bottles sampled for her breakthrough   IRRITABLE BOWEL SYNDROME OK on align, prn hyoscyamine

## 2014-02-03 NOTE — Assessment & Plan Note (Signed)
OK on align, prn hyoscyamine

## 2014-02-03 NOTE — Assessment & Plan Note (Signed)
OK Refill omeprazole A few Nexium bottles sampled for her breakthrough

## 2014-02-24 ENCOUNTER — Encounter (HOSPITAL_COMMUNITY): Payer: Self-pay | Admitting: Emergency Medicine

## 2014-02-24 ENCOUNTER — Emergency Department (HOSPITAL_COMMUNITY): Payer: Medicare HMO

## 2014-02-24 ENCOUNTER — Emergency Department (HOSPITAL_COMMUNITY)
Admission: EM | Admit: 2014-02-24 | Discharge: 2014-02-24 | Disposition: A | Discharge status: Home / Self Care | Payer: Medicare HMO | Attending: Emergency Medicine | Admitting: Emergency Medicine

## 2014-02-24 DIAGNOSIS — S79912A Unspecified injury of left hip, initial encounter: Secondary | ICD-10-CM

## 2014-02-24 DIAGNOSIS — Y929 Unspecified place or not applicable: Secondary | ICD-10-CM | POA: Insufficient documentation

## 2014-02-24 DIAGNOSIS — IMO0002 Reserved for concepts with insufficient information to code with codable children: Secondary | ICD-10-CM | POA: Insufficient documentation

## 2014-02-24 DIAGNOSIS — Z860100 Personal history of colon polyps, unspecified: Secondary | ICD-10-CM | POA: Insufficient documentation

## 2014-02-24 DIAGNOSIS — W19XXXA Unspecified fall, initial encounter: Secondary | ICD-10-CM

## 2014-02-24 DIAGNOSIS — H409 Unspecified glaucoma: Secondary | ICD-10-CM | POA: Insufficient documentation

## 2014-02-24 DIAGNOSIS — S79919A Unspecified injury of unspecified hip, initial encounter: Secondary | ICD-10-CM | POA: Insufficient documentation

## 2014-02-24 DIAGNOSIS — S8990XA Unspecified injury of unspecified lower leg, initial encounter: Secondary | ICD-10-CM | POA: Insufficient documentation

## 2014-02-24 DIAGNOSIS — K219 Gastro-esophageal reflux disease without esophagitis: Secondary | ICD-10-CM | POA: Insufficient documentation

## 2014-02-24 DIAGNOSIS — S99929A Unspecified injury of unspecified foot, initial encounter: Secondary | ICD-10-CM

## 2014-02-24 DIAGNOSIS — Y9389 Activity, other specified: Secondary | ICD-10-CM | POA: Insufficient documentation

## 2014-02-24 DIAGNOSIS — Z8701 Personal history of pneumonia (recurrent): Secondary | ICD-10-CM | POA: Insufficient documentation

## 2014-02-24 DIAGNOSIS — Z87891 Personal history of nicotine dependence: Secondary | ICD-10-CM | POA: Insufficient documentation

## 2014-02-24 DIAGNOSIS — K589 Irritable bowel syndrome without diarrhea: Secondary | ICD-10-CM | POA: Insufficient documentation

## 2014-02-24 DIAGNOSIS — S99919A Unspecified injury of unspecified ankle, initial encounter: Secondary | ICD-10-CM

## 2014-02-24 DIAGNOSIS — J45909 Unspecified asthma, uncomplicated: Secondary | ICD-10-CM | POA: Insufficient documentation

## 2014-02-24 DIAGNOSIS — W010XXA Fall on same level from slipping, tripping and stumbling without subsequent striking against object, initial encounter: Secondary | ICD-10-CM | POA: Insufficient documentation

## 2014-02-24 DIAGNOSIS — Z79899 Other long term (current) drug therapy: Secondary | ICD-10-CM | POA: Insufficient documentation

## 2014-02-24 DIAGNOSIS — Z8739 Personal history of other diseases of the musculoskeletal system and connective tissue: Secondary | ICD-10-CM | POA: Insufficient documentation

## 2014-02-24 DIAGNOSIS — Z8619 Personal history of other infectious and parasitic diseases: Secondary | ICD-10-CM | POA: Insufficient documentation

## 2014-02-24 DIAGNOSIS — S79929A Unspecified injury of unspecified thigh, initial encounter: Principal | ICD-10-CM

## 2014-02-24 MED ORDER — TRAMADOL HCL 50 MG PO TABS
50.0000 mg | ORAL_TABLET | Freq: Four times a day (QID) | ORAL | Status: DC | PRN
Start: 2014-02-24 — End: 2014-06-02

## 2014-02-24 NOTE — ED Provider Notes (Signed)
Medical screening examination/treatment/procedure(s) were performed by non-physician practitioner and as supervising physician I was immediately available for consultation/collaboration.   EKG Interpretation None        Blanchard Kelch, MD 02/24/14 1614

## 2014-02-24 NOTE — ED Notes (Signed)
Pt states that she was taking her husband to short stay for a reclast infusion and got tripped up on her shoes and fell.  Denies hitting head.  Landed on her lt hip and knee.

## 2014-02-24 NOTE — ED Provider Notes (Signed)
CSN: [id number]     Arrival date & time 02/24/14  1335 History  This chart was scribed for Alvina Chou, PA, working with Blanchard Kelch, MD, by Delphia Grates, ED Scribe. This patient was seen in room WTR8/WTR8 and the patient's care was started at 3:07 PM.    Chief Complaint  Patient presents with  . Fall  . Knee Pain  . Hip Pain     Patient is a 68 y.o. female presenting with fall, knee pain, and hip pain. The history is provided by the patient. No language interpreter was used.  Fall This is a new problem. Pertinent negatives include no chest pain, no abdominal pain, no headaches and no shortness of breath.  Knee Pain Location:  Knee Injury: no   Pain details:    Quality:  Aching   Radiates to:  Does not radiate   Severity:  Moderate   Timing:  Constant   Progression:  Unchanged Chronicity:  New Dislocation: no   Foreign body present:  Unable to specify Associated symptoms: no back pain, no decreased ROM, no fatigue, no fever, no itching, no muscle weakness, no neck pain, no numbness, no stiffness, no swelling and no tingling   Risk factors: no concern for non-accidental trauma, no frequent fractures, no known bone disorder, no obesity and no recent illness   Hip Pain This is a new problem. The problem has not changed since onset.Pertinent negatives include no chest pain, no abdominal pain, no headaches and no shortness of breath. The symptoms are aggravated by walking.    HPI Comments: Kayla [name] is a 68 y.o. female who presents to the Emergency Department complaining of fall that PTA. Patient states she was taking her husband to short stay when she tripped and fell on her left side. Patient denies hitting her head. There is associated left chest tenderness, left hip pain, left knee pain.  Patient states she is ambulatory. She denies numbness and tingling. Patient reports she is unable to take NSAIDs and prefers not to take narcotics.   Past Medical History   Diagnosis Date  . Asthma   . GERD (gastroesophageal reflux disease)     gastric polyp x3  . Osteopenia     last 07/2011  . Fibromyalgia   . Glaucoma      Dr Bing Plume  . IBS (irritable bowel syndrome)   . Diverticulosis   . Pneumonia      OP as child  . Diverticulitis   . Fasting hyperglycemia 04/2011    FBS 108  . Fundic gland polyps of stomach, benign   . CAP (community acquired pneumonia) 08/2012    Dr Birdie Riddle  . Shingles    Past Surgical History  Procedure Laterality Date  . Spine surgery      T9-L5 fusions  . Colonoscopy w/ polypectomy  [phone]    last  colonoscopy 2007, Dr Carlean Purl  . Esophagogastroduodenoscopy     Family History  Problem Relation Age of Onset  . Breast cancer Mother   . Breast cancer Sister   . Breast cancer Maternal Aunt     two  . Breast cancer Paternal Aunt   . Asthma Paternal Aunt   . Heart failure Father     CHF  . Kidney disease Maternal Grandmother   . Ovarian cancer      Niece x2  . Coronary artery disease Paternal Aunt     triple CABG  . Heart attack Paternal Grandmother     MI  in late 60s  . Heart disease Paternal Grandfather   . Heart attack Paternal Uncle      MI in 33s  . Heart disease Maternal Aunt   . Diabetes Neg Hx   . Stroke Neg Hx   . COPD Neg Hx   . Breast cancer      niece   History  Substance Use Topics  . Smoking status: Former Smoker -- 0.50 packs/day for 10 years    Types: Cigarettes    Quit date: 09/10/1974  . Smokeless tobacco: Never Used     Comment: smoked 1966- 1976, up to 1 ppd  . Alcohol Use: No   OB History   Grav Para Term Preterm Abortions TAB SAB Ect Mult Living                 Review of Systems  Constitutional: Negative for fever and fatigue.  Respiratory: Negative for shortness of breath.   Cardiovascular: Negative for chest pain.  Gastrointestinal: Negative for abdominal pain.  Musculoskeletal: Negative for back pain, neck pain and stiffness.       Left hip and knee pain   Skin: Negative for itching.  Neurological: Negative for headaches.  All other systems reviewed and are negative.     Allergies  Cephalosporins; Gabapentin; Levofloxacin; Psyllium; Sulfonamide derivatives; Aspirin; Belladonna; Ciprofloxacin hcl; Conjugated estrogens; Nabumetone; Nsaids; Zicam cold remedy; and Prednisone  Home Medications   Prior to Admission medications   Medication Sig Start Date End Date Taking? Authorizing Juley Giovanetti  acetaminophen (TYLENOL) 500 MG tablet Take 500 mg by mouth as needed.    Historical Norene Oliveri, MD  albuterol (PROVENTIL HFA;VENTOLIN HFA) 108 (90 BASE) MCG/ACT inhaler Inhale 2 puffs into the lungs. As needed only 03/07/11   Rosalita Chessman, DO  albuterol (PROVENTIL) (2.5 MG/3ML) 0.083% nebulizer solution Take 2.5 mg by nebulization every 6 (six) hours as needed. 03/07/11   Rosalita Chessman, DO  bifidobacterium infantis (ALIGN) capsule Take 1 capsule by mouth daily. 12/29/12   Gatha Mayer, MD  cetirizine (ZYRTEC) 10 MG tablet Take 10 mg by mouth as needed.     Historical Tarryn Bogdan, MD  Cholecalciferol (VITAMIN D3) 2000 UNITS TABS Take by mouth daily.    Historical Janina Trafton, MD  EPINEPHrine (EPI-PEN) 0.3 mg/0.3 mL DEVI Inject 0.3 mLs (0.3 mg total) into the muscle once. 05/28/11   Tammy S Parrett, NP  esomeprazole (NEXIUM) 40 MG capsule Take 40 mg by mouth as needed. She uses prn to alternate between this and the omeprazole    Historical Duncan Alejandro, MD  Flaxseed, Linseed, (FLAX SEED OIL) 1000 MG CAPS Take 1 capsule by mouth daily.    Historical Gavyn Zoss, MD  fluticasone (FLONASE) 50 MCG/ACT nasal spray INSTILL 2 SPRAYS IN EACH NOSTRIL ONCE DAILY 01/11/14   Rosalita Chessman, DO  GuaiFENesin (MUCINEX PO) Take by mouth as needed.     Historical Lennart Gladish, MD  hyoscyamine (LEVSIN SL) 0.125 MG SL tablet Place 0.125 mg under the tongue every 6 (six) hours as needed. 1 under tongue every 6 hrs prn for abdominal pain     Historical Harnoor Reta, MD  latanoprost (XALATAN) 0.005 %  ophthalmic solution Place 1 drop into both eyes at bedtime.      Historical Kolt Mcwhirter, MD  Magnesium 250 MG TABS Take 1 tablet by mouth daily.     Historical Seraj Dunnam, MD  metoprolol tartrate (LOPRESSOR) 25 MG tablet TAKE ONE TABLET BY MOUTH TWICE DAILY **TAKE  ONE  EXTRA  TABLET  AS  NEEDED** 09/22/13   Minus Breeding, MD  montelukast (SINGULAIR) 10 MG tablet TAKE ONE TABLET BY MOUTH EVERY DAY 09/23/13   Hendricks Limes, MD  nystatin-triamcinolone [facility] II) cream Apply 1 application topically as needed.    Historical Laquincy Eastridge, MD  omeprazole (PRILOSEC) 40 MG capsule Take 1 capsule (40 mg total) by mouth daily. 02/03/14   Gatha Mayer, MD  ranitidine (ZANTAC) 150 MG tablet Take 150 mg by mouth at bedtime.     Historical Melaney Tellefsen, MD  traMADol (ULTRAM) 50 MG tablet Take 1 tablet (50 mg total) by mouth every 6 (six) hours as needed. 06/08/11   Kathee Delton, MD  traMADol (ULTRAM) 50 MG tablet Take 1 tablet (50 mg total) by mouth every 6 (six) hours as needed. 02/24/14   Alvina Chou, PA-C   Triage Vitals: BP 176/76  Pulse 60  Temp(Src) 97.1 F (36.2 C) (Oral)  Resp 18  SpO2 100%  Physical Exam  Nursing note and vitals reviewed. Constitutional: She is oriented to person, place, and time. She appears well-developed and well-nourished. No distress.  HENT:  Head: Normocephalic and atraumatic.  Eyes: Conjunctivae and EOM are normal.  Neck: Neck supple. No tracheal deviation present.  Cardiovascular: Normal rate.   Pulmonary/Chest: Effort normal. No respiratory distress.  Musculoskeletal: Normal range of motion.  Left lateral hip tenderness to palpation. Slightly limited ROM due to pain. No obvious deformity. Generalized left knee tenderness to palpation. Full ROM. No obvious deformity.   Neurological: She is alert and oriented to person, place, and time.  Extremity strength and sensation equal and intact bilaterally.   Skin: Skin is warm and dry.  Psychiatric: She has a normal mood and  affect. Her behavior is normal.    ED Course  Procedures (including critical care time)  DIAGNOSTIC STUDIES: Oxygen Saturation is 100% on room air, normal by my interpretation.    COORDINATION OF CARE: At 1512 Discussed treatment plan with patient which includes Tramadol. Patient agrees. '  Labs Review Labs Reviewed - No data to display  Imaging Review Dg Hip Complete Left  02/24/2014   CLINICAL DATA:  Fall.  Left hip pain.  EXAM: LEFT HIP - COMPLETE 2+ VIEW  COMPARISON:  01/21/2008  FINDINGS: Posterolateral rod and pedicle screw fixation hardware noted in the lower lumbar spine. Tiny metal density posterior to the sacrum in the midline.  I do not perceive a hip fracture. No definite bony pelvic fracture identified.  IMPRESSION: 1. No fracture or acute bony findings identified.   Electronically Signed   By: Sherryl Barters M.D.   On: 02/24/2014 14:50   Dg Knee Complete 4 Views Left  02/24/2014   CLINICAL DATA:  Fall, knee pain  EXAM: LEFT KNEE - COMPLETE 4+ VIEW  COMPARISON:  none  FINDINGS: No fracture of the proximal tibia or distal femur. Patella is normal. No joint effusion.  IMPRESSION: No acute osseous abnormality.   Electronically Signed   By: Suzy Bouchard M.D.   On: 02/24/2014 14:49     EKG Interpretation None      MDM   Final diagnoses:  Fall  Injury of left hip   Xrays unremarkable for acute changes. Patient will be discharged with Tramadol for pain. Vitals stable and patient afebrile. Patient is able to ambulate.   I personally performed the services described in this documentation, which was scribed in my presence. The recorded information has been reviewed and is accurate.    Alvina Chou, PA-C 02/24/14 1604

## 2014-02-24 NOTE — Discharge Instructions (Signed)
Take Tramadol as needed for pain. Apply ice to your injuries. Refer to attached documents for more information.

## 2014-06-02 ENCOUNTER — Encounter: Payer: Self-pay | Admitting: Internal Medicine

## 2014-06-02 ENCOUNTER — Ambulatory Visit (INDEPENDENT_AMBULATORY_CARE_PROVIDER_SITE_OTHER): Payer: Commercial Managed Care - HMO | Admitting: Internal Medicine

## 2014-06-02 VITALS — BP 112/68 | HR 60 | Temp 98.5°F | Wt 159.2 lb

## 2014-06-02 DIAGNOSIS — S93409A Sprain of unspecified ligament of unspecified ankle, initial encounter: Secondary | ICD-10-CM

## 2014-06-02 NOTE — Progress Notes (Signed)
Pre visit review using our clinic review tool, if applicable. No additional management support is needed unless otherwise documented below in the visit note. 

## 2014-06-02 NOTE — Progress Notes (Signed)
Subjective:    Patient ID: Kayla [name], female    DOB: Apr 23, 1946, 68 y.o.   MRN: [id number]  DOS:  06/02/2014 Type of visit - description : acute Interval history: Sx started last night-- she stood up and immediately felt pain @ the L ankle, today the pain is persisting. Had a busy day yesterday but no injury or fall  ROS No F/C, edema No rash No recent air or car trips    Past Medical History  Diagnosis Date  . Asthma   . GERD (gastroesophageal reflux disease)     gastric polyp x3  . Osteopenia     last 07/2011  . Fibromyalgia   . Glaucoma      Dr Bing Plume  . IBS (irritable bowel syndrome)   . Diverticulosis   . Pneumonia      OP as child  . Diverticulitis   . Fasting hyperglycemia 04/2011    FBS 108  . Fundic gland polyps of stomach, benign   . CAP (community acquired pneumonia) 08/2012    Dr Birdie Riddle  . Shingles     Past Surgical History  Procedure Laterality Date  . Spine surgery      T9-L5 fusions  . Colonoscopy w/ polypectomy  [phone]    last  colonoscopy 2007, Dr Carlean Purl  . Esophagogastroduodenoscopy      History   Social History  . Marital Status: Married    Spouse Name: N/A    Number of Children: N/A  . Years of Education: N/A   Occupational History  . Not on file.   Social History Main Topics  . Smoking status: Former Smoker -- 0.50 packs/day for 10 years    Types: Cigarettes    Quit date: 09/10/1974  . Smokeless tobacco: Never Used     Comment: smoked 1966- 1976, up to 1 ppd  . Alcohol Use: No  . Drug Use: No  . Sexual Activity: Not on file   Other Topics Concern  . Not on file   Social History Narrative   Daily caffeine 3 cups   Regular exercise   Married              Medication List       This list is accurate as of: 06/02/14 11:59 PM.  Always use your most recent med list.               acetaminophen 500 MG tablet  Commonly known as:  TYLENOL  Take 500 mg by mouth as needed.     albuterol 108 (90  BASE) MCG/ACT inhaler  Commonly known as:  PROVENTIL HFA;VENTOLIN HFA  Inhale 2 puffs into the lungs. As needed only     albuterol (2.5 MG/3ML) 0.083% nebulizer solution  Commonly known as:  PROVENTIL  Take 2.5 mg by nebulization every 6 (six) hours as needed.     bifidobacterium infantis capsule  Take 1 capsule by mouth daily.     cetirizine 10 MG tablet  Commonly known as:  ZYRTEC  Take 10 mg by mouth as needed.     EPINEPHrine 0.3 mg/0.3 mL Devi  Commonly known as:  EPI-PEN  Inject 0.3 mLs (0.3 mg total) into the muscle once.     esomeprazole 40 MG capsule  Commonly known as:  NEXIUM  Take 40 mg by mouth as needed. She uses prn to alternate between this and the omeprazole     fluticasone 50 MCG/ACT nasal spray  Commonly known as:  FLONASE  INSTILL 2 SPRAYS IN EACH NOSTRIL ONCE DAILY     GNP FLAX SEED OIL PO  Take 1 mL by mouth daily.     hyoscyamine 0.125 MG SL tablet  Commonly known as:  LEVSIN SL  Place 0.125 mg under the tongue every 6 (six) hours as needed. 1 under tongue every 6 hrs prn for abdominal pain     Magnesium 250 MG Tabs  Take 1 tablet by mouth daily.     metoprolol tartrate 25 MG tablet  Commonly known as:  LOPRESSOR  TAKE ONE TABLET BY MOUTH TWICE DAILY **TAKE  ONE  EXTRA  TABLET  AS  NEEDED**     montelukast 10 MG tablet  Commonly known as:  SINGULAIR  TAKE ONE TABLET BY MOUTH EVERY DAY     MUCINEX PO  Take by mouth as needed.     nystatin-triamcinolone cream  Commonly known as:  MYCOLOG II  Apply 1 application topically as needed.     omeprazole 40 MG capsule  Commonly known as:  PRILOSEC  Take 1 capsule (40 mg total) by mouth daily.     ranitidine 150 MG tablet  Commonly known as:  ZANTAC  Take 150 mg by mouth at bedtime.     traMADol 50 MG tablet  Commonly known as:  ULTRAM  Take 1 tablet (50 mg total) by mouth every 6 (six) hours as needed.     travoprost (benzalkonium) 0.004 % ophthalmic solution  Commonly known as:  TRAVATAN    Place 1 drop into both eyes at bedtime.     Vitamin D3 2000 UNITS Tabs  Take by mouth daily.           Objective:   Physical Exam BP 112/68  Pulse 60  Temp(Src) 98.5 F (36.9 C) (Oral)  Wt 159 lb 4 oz (72.235 kg)  SpO2 97% General -- alert, well-developed, NAD.  Extremities-- no pretibial edema bilaterally , calves symmetric  R ankle normal L ankle: no deformities, ROM wnl, no TTP Good pedal pulses B Neurologic--  alert & oriented X3. Speech normal, gait appropriate for age, strength symmetric and appropriate for age.  Psych-- Cognition and judgment appear intact. Cooperative with normal attention span and concentration. No anxious or depressed appearing.     Assessment & Plan:  Mild left ankle sprain Recommend RICE, see instructions  Call if no better, XR?

## 2014-06-02 NOTE — Patient Instructions (Signed)
Rest Ice Tylenol Ankle brace Call if not improving soon

## 2014-06-16 ENCOUNTER — Encounter: Payer: Self-pay | Admitting: Family Medicine

## 2014-07-22 ENCOUNTER — Encounter: Payer: Self-pay | Admitting: Family Medicine

## 2014-07-22 ENCOUNTER — Ambulatory Visit (INDEPENDENT_AMBULATORY_CARE_PROVIDER_SITE_OTHER): Payer: Commercial Managed Care - HMO | Admitting: Family Medicine

## 2014-07-22 VITALS — BP 118/66 | HR 53 | Temp 99.1°F | Wt 158.2 lb

## 2014-07-22 DIAGNOSIS — E785 Hyperlipidemia, unspecified: Secondary | ICD-10-CM

## 2014-07-22 DIAGNOSIS — I10 Essential (primary) hypertension: Secondary | ICD-10-CM

## 2014-07-22 DIAGNOSIS — J302 Other seasonal allergic rhinitis: Secondary | ICD-10-CM

## 2014-07-22 DIAGNOSIS — Z1239 Encounter for other screening for malignant neoplasm of breast: Secondary | ICD-10-CM

## 2014-07-22 DIAGNOSIS — E2839 Other primary ovarian failure: Secondary | ICD-10-CM

## 2014-07-22 MED ORDER — MONTELUKAST SODIUM 10 MG PO TABS: ORAL_TABLET | ORAL | Status: DC

## 2014-07-22 NOTE — Patient Instructions (Signed)

## 2014-07-22 NOTE — Progress Notes (Signed)
Subjective:    Patient here for follow-up of elevated blood pressure.  She is exercising and is adherent to a low-salt diet.  Blood pressure is well controlled at home. Cardiac symptoms: none. Patient denies: chest pain, chest pressure/discomfort, claudication, dyspnea, exertional chest pressure/discomfort, fatigue, irregular heart beat, lower extremity edema, near-syncope, orthopnea, palpitations, paroxysmal nocturnal dyspnea, syncope and tachypnea. Cardiovascular risk factors: advanced age (older than 16 for men, 66 for women), dyslipidemia, hypertension and obesity (BMI >= 30 kg/m2). Use of agents associated with hypertension: none. History of target organ damage: none. Pt is also here to f/u allergies - she needs refills and cholesterol.  No complaints.  She is also requesting mammogram and bmd be scheduled.  The following portions of the patient's history were reviewed and updated as appropriate:  She  has a past medical history of Asthma; GERD (gastroesophageal reflux disease); Osteopenia; Fibromyalgia; Glaucoma; IBS (irritable bowel syndrome); Diverticulosis; Pneumonia; Diverticulitis; Fasting hyperglycemia (04/2011); Fundic gland polyps of stomach, benign; CAP (community acquired pneumonia) (08/2012); and Shingles. She  does not have any pertinent problems on file. She  has past surgical history that includes Spine surgery; Colonoscopy w/ polypectomy ([id number]); and Esophagogastroduodenoscopy. Her family history includes Asthma in her paternal aunt; Breast cancer in her maternal aunt, mother, paternal aunt, sister, and another family member; Coronary artery disease in her paternal aunt; Heart attack in her paternal grandmother and paternal uncle; Heart disease in her maternal aunt and paternal grandfather; Heart failure in her father; Kidney disease in her maternal grandmother; Ovarian cancer in an other family member. There is no history of Diabetes, Stroke, or COPD. She  reports that she  quit smoking about 39 years ago. Her smoking use included Cigarettes. She has a 5 pack-year smoking history. She has never used smokeless tobacco. She reports that she does not drink alcohol or use illicit drugs. She has a current medication list which includes the following prescription(s): acetaminophen, albuterol, albuterol, bifidobacterium infantis, cetirizine, vitamin d3, epinephrine, esomeprazole, flaxseed (linseed), fluticasone, guaifenesin, hyoscyamine, magnesium, metoprolol tartrate, montelukast, nystatin-triamcinolone, omeprazole, ranitidine, tramadol, and travoprost (benzalkonium). Current Outpatient Prescriptions on File Prior to Visit  Medication Sig Dispense Refill  . acetaminophen (TYLENOL) 500 MG tablet Take 500 mg by mouth as needed.    Marland Kitchen albuterol (PROVENTIL HFA;VENTOLIN HFA) 108 (90 BASE) MCG/ACT inhaler Inhale 2 puffs into the lungs. As needed only    . albuterol (PROVENTIL) (2.5 MG/3ML) 0.083% nebulizer solution Take 2.5 mg by nebulization every 6 (six) hours as needed.    . bifidobacterium infantis (ALIGN) capsule Take 1 capsule by mouth daily. 21 capsule 0  . cetirizine (ZYRTEC) 10 MG tablet Take 10 mg by mouth as needed.     . Cholecalciferol (VITAMIN D3) 2000 UNITS TABS Take by mouth daily.    Marland Kitchen EPINEPHrine (EPI-PEN) 0.3 mg/0.3 mL DEVI Inject 0.3 mLs (0.3 mg total) into the muscle once. 1 Device 0  . esomeprazole (NEXIUM) 40 MG capsule Take 40 mg by mouth as needed. She uses prn to alternate between this and the omeprazole    . Flaxseed, Linseed, (GNP FLAX SEED OIL PO) Take 1 mL by mouth daily.    . fluticasone (FLONASE) 50 MCG/ACT nasal spray INSTILL 2 SPRAYS IN EACH NOSTRIL ONCE DAILY 48 g 3  . GuaiFENesin (MUCINEX PO) Take by mouth as needed.     . hyoscyamine (LEVSIN SL) 0.125 MG SL tablet Place 0.125 mg under the tongue every 6 (six) hours as needed. 1 under tongue every 6 hrs prn for abdominal  pain     . Magnesium 250 MG TABS Take 1 tablet by mouth daily.     .  metoprolol tartrate (LOPRESSOR) 25 MG tablet TAKE ONE TABLET BY MOUTH TWICE DAILY **TAKE  ONE  EXTRA  TABLET  AS  NEEDED** 270 tablet 4  . nystatin-triamcinolone (MYCOLOG II) cream Apply 1 application topically as needed.    Marland Kitchen omeprazole (PRILOSEC) 40 MG capsule Take 1 capsule (40 mg total) by mouth daily. 90 capsule 3  . ranitidine (ZANTAC) 150 MG tablet Take 150 mg by mouth at bedtime.     . traMADol (ULTRAM) 50 MG tablet Take 1 tablet (50 mg total) by mouth every 6 (six) hours as needed. 40 tablet 0  . travoprost, benzalkonium, (TRAVATAN) 0.004 % ophthalmic solution Place 1 drop into both eyes at bedtime.     No current facility-administered medications on file prior to visit.   She is allergic to cephalosporins; gabapentin; levofloxacin; psyllium; sulfonamide derivatives; aspirin; belladonna; ciprofloxacin hcl; conjugated estrogens; nabumetone; nsaids; zicam cold remedy; and prednisone..  Review of Systems Pertinent items are noted in HPI.     Objective:    BP 118/66 mmHg  Pulse 53  Temp(Src) 99.1 F (37.3 C) (Oral)  Wt 158 lb 3.2 oz (71.759 kg)  SpO2 96% General appearance: alert, cooperative, appears stated age and no distress Throat: lips, mucosa, and tongue normal; teeth and gums normal Neck: no adenopathy, no carotid bruit, no JVD, supple, symmetrical, trachea midline and thyroid not enlarged, symmetric, no tenderness/mass/nodules Lungs: clear to auscultation bilaterally Heart: S1, S2 normal Extremities: extremities normal, atraumatic, no cyanosis or edema    Assessment:    Hypertension, normal blood pressure . Evidence of target organ damage: none.    Plan:    Medication: no change. Regular aerobic exercise. Check blood pressures 2-3 times weekly and record. Follow up: 6 months and as needed.    1. Seasonal allergies   - montelukast (SINGULAIR) 10 MG tablet; TAKE ONE TABLET BY MOUTH EVERY DAY  Dispense: 90 tablet; Refill: 3  2. Hyperlipidemia  Check labs  -  Basic metabolic panel - Hepatic function panel - Lipid panel  3. Essential hypertension stable - Basic metabolic panel - Hepatic function panel - Lipid panel  4. Breast cancer screening   - MM DIGITAL SCREENING BILATERAL; Future  5. Estrogen deficiency   - DG Bone Density; Future

## 2014-07-22 NOTE — Progress Notes (Signed)
Pre visit review using our clinic review tool, if applicable. No additional management support is needed unless otherwise documented below in the visit note. 

## 2014-07-23 LAB — LIPID PANEL
Cholesterol: 227 mg/dL — ABNORMAL HIGH (ref 0–200)
HDL: 56.2 mg/dL (ref >39.00)
LDL CALC: 155 mg/dL — AB (ref 0–99)
NonHDL: 170.8
TRIGLYCERIDES: 80 mg/dL (ref 0.0–149.0)
Total CHOL/HDL Ratio: 4
VLDL: 16 mg/dL (ref 0.0–40.0)

## 2014-07-23 LAB — BASIC METABOLIC PANEL
BUN: 13 mg/dL (ref 6–23)
CALCIUM: 9.9 mg/dL (ref 8.4–10.5)
CHLORIDE: 102 meq/L (ref 96–112)
CO2: 28 meq/L (ref 19–32)
CREATININE: 0.7 mg/dL (ref 0.4–1.2)
GFR: 88.22 mL/min (ref >60.00)
GLUCOSE: 100 mg/dL — AB (ref 70–99)
Potassium: 5.6 mEq/L — ABNORMAL HIGH (ref 3.5–5.1)
Sodium: 140 mEq/L (ref 135–145)

## 2014-07-23 LAB — HEPATIC FUNCTION PANEL
ALT: 20 U/L (ref 0–35)
AST: 25 U/L (ref 0–37)
Albumin: 4 g/dL (ref 3.5–5.2)
Alkaline Phosphatase: 86 U/L (ref 39–117)
BILIRUBIN DIRECT: 0.1 mg/dL (ref 0.0–0.3)
BILIRUBIN TOTAL: 0.5 mg/dL (ref 0.2–1.2)
Total Protein: 7.2 g/dL (ref 6.0–8.3)

## 2014-07-26 ENCOUNTER — Telehealth: Payer: Self-pay | Admitting: Family Medicine

## 2014-07-26 DIAGNOSIS — E875 Hyperkalemia: Secondary | ICD-10-CM

## 2014-07-26 NOTE — Telephone Encounter (Signed)
Caller name: Korene, Dula Relation to pt: self  Call back number: [phone]   Reason for call:   Pt states as per Dr. Etter Sjogren wanted pt to have lab work done to re check her potassium levels. Pt scheduled lab appointment 07/27/14. Requesting orders.

## 2014-07-26 NOTE — Telephone Encounter (Signed)
Order in      KP 

## 2014-07-27 ENCOUNTER — Other Ambulatory Visit (INDEPENDENT_AMBULATORY_CARE_PROVIDER_SITE_OTHER): Payer: Commercial Managed Care - HMO

## 2014-07-27 DIAGNOSIS — E875 Hyperkalemia: Secondary | ICD-10-CM

## 2014-07-27 LAB — BASIC METABOLIC PANEL
BUN: 13 mg/dL (ref 6–23)
CO2: 26 mEq/L (ref 19–32)
CREATININE: 0.6 mg/dL (ref 0.4–1.2)
Calcium: 9.2 mg/dL (ref 8.4–10.5)
Chloride: 103 mEq/L (ref 96–112)
GFR: 111.81 mL/min (ref >60.00)
GLUCOSE: 84 mg/dL (ref 70–99)
POTASSIUM: 4.2 meq/L (ref 3.5–5.1)
Sodium: 134 mEq/L — ABNORMAL LOW (ref 135–145)

## 2014-07-28 ENCOUNTER — Telehealth: Payer: Self-pay | Admitting: Family Medicine

## 2014-07-28 NOTE — Telephone Encounter (Signed)
Order faxed to [facility].      KP 

## 2014-07-28 NOTE — Telephone Encounter (Signed)
Caller name: Ximena, Todaro Relation to pt: self  Call back number: [phone]   Reason for call:   Pt requesting a referral for 3D mamo and as per [facility] the reference #[id number] pt would like to have her appointment at [facility] on [address], [place].

## 2014-09-16 ENCOUNTER — Telehealth: Payer: Self-pay

## 2014-09-16 NOTE — Telephone Encounter (Signed)
Received BMD and the patient has Osteoporosis, I made her aware and she declined Fosamax, she has tried the Fosamax and Atelvia. She declined Reclast b/c she was a part of the clinicial trail and Prolia at this time, she would like to increase her weight bearing exercise, strength training and cardio instead of taking a medication.  She will also go back to taking her calcium. I made her aware that she is at a high risk for fracture if not treatment is given and she said she would ultimately think and pray about it and give me a call back if she decides to start something.      KP

## 2014-09-28 ENCOUNTER — Encounter: Payer: Self-pay | Admitting: Family Medicine

## 2014-10-20 ENCOUNTER — Encounter: Payer: Self-pay | Admitting: Medical

## 2014-10-20 ENCOUNTER — Ambulatory Visit (INDEPENDENT_AMBULATORY_CARE_PROVIDER_SITE_OTHER): Payer: Commercial Managed Care - HMO | Admitting: Medical

## 2014-10-20 VITALS — BP 170/90 | HR 61 | Temp 98.9°F | Ht 62.5 in | Wt 158.0 lb

## 2014-10-20 DIAGNOSIS — L03818 Cellulitis of other sites: Secondary | ICD-10-CM

## 2014-10-20 DIAGNOSIS — I10 Essential (primary) hypertension: Secondary | ICD-10-CM

## 2014-10-20 DIAGNOSIS — L0390 Cellulitis, unspecified: Secondary | ICD-10-CM | POA: Insufficient documentation

## 2014-10-20 HISTORY — DX: Cellulitis, unspecified: L03.90

## 2014-10-20 MED ORDER — DOXYCYCLINE HYCLATE 100 MG PO TABS: 100.0000 mg | ORAL_TABLET | Freq: Two times a day (BID) | ORAL | Status: DC

## 2014-10-20 NOTE — Progress Notes (Signed)
Pre visit review using our clinic review tool, if applicable. No additional management support is needed unless otherwise documented below in the visit note. 

## 2014-10-20 NOTE — Progress Notes (Signed)
Subjective:    Patient ID: Kayla [name], female    DOB: 1946-02-10, 69 y.o.   MRN: [id number]  HPI   Pt in states this am when she picked up milk she noted that her rt thumb  was tender. No known trauma. The thumb is red and warm. Faint pinkish streak from the thumb to base of the wrist. She noticed pinkish area on wrist this afternoon.  No trauma. No insect bite that she knows. No fever, no chills.   No hx of cellullitis or skin infections.   Review of Systems  Constitutional: Negative for fever, chills, diaphoresis, activity change and fatigue.  Respiratory: Negative for cough, chest tightness and shortness of breath.   Cardiovascular: Negative for chest pain, palpitations and leg swelling.  Gastrointestinal: Negative for nausea, vomiting and abdominal pain.  Musculoskeletal: Negative for neck pain and neck stiffness.  Neurological: Positive for headaches. Negative for dizziness, tremors, seizures, syncope, facial asymmetry, speech difficulty, weakness, light-headedness and numbness.       Very minimal faint ha.  Psychiatric/Behavioral: Negative for behavioral problems, confusion and agitation. The patient is not nervous/anxious.    Past Medical History  Diagnosis Date  . Asthma   . GERD (gastroesophageal reflux disease)     gastric polyp x3  . Osteopenia     last 07/2011  . Fibromyalgia   . Glaucoma      Dr Bing Plume  . IBS (irritable bowel syndrome)   . Diverticulosis   . Pneumonia      OP as child  . Diverticulitis   . Fasting hyperglycemia 04/2011    FBS 108  . Fundic gland polyps of stomach, benign   . CAP (community acquired pneumonia) 08/2012    Dr Birdie Riddle  . Shingles     History   Social History  . Marital Status: Married    Spouse Name: N/A  . Number of Children: N/A  . Years of Education: N/A   Occupational History  . Not on file.   Social History Main Topics  . Smoking status: Former Smoker -- 0.50 packs/day for 10 years    Types: Cigarettes      Quit date: 09/10/1974  . Smokeless tobacco: Never Used     Comment: smoked 1966- 1976, up to 1 ppd  . Alcohol Use: No  . Drug Use: No  . Sexual Activity: Not on file   Other Topics Concern  . Not on file   Social History Narrative   Daily caffeine 3 cups   Regular exercise   Married          Past Surgical History  Procedure Laterality Date  . Spine surgery      T9-L5 fusions  . Colonoscopy w/ polypectomy  [phone]    last  colonoscopy 2007, Dr Carlean Purl  . Esophagogastroduodenoscopy      Family History  Problem Relation Age of Onset  . Breast cancer Mother   . Breast cancer Sister   . Breast cancer Maternal Aunt     two  . Breast cancer Paternal Aunt   . Asthma Paternal Aunt   . Heart failure Father     CHF  . Kidney disease Maternal Grandmother   . Ovarian cancer      Niece x2  . Coronary artery disease Paternal Aunt     triple CABG  . Heart attack Paternal Grandmother     MI in late 61s  . Heart disease Paternal Grandfather   . Heart attack Paternal  Uncle      MI in 31s  . Heart disease Maternal Aunt   . Diabetes Neg Hx   . Stroke Neg Hx   . COPD Neg Hx   . Breast cancer      niece    Allergies  Allergen Reactions  . Cephalosporins     Blisters  orally  . Gabapentin     REACTION: rash  . Levofloxacin     REACTION: stomach ache and rash  . Psyllium     REACTION: rash  . Sulfonamide Derivatives     REACTION: shock, urticaria  . Aspirin     REACTION: stomach pain  . Belladonna   . Ciprofloxacin Hcl   . Conjugated Estrogens   . Nabumetone   . Nsaids   . Zicam Cold Remedy [Erysidoron #1]   . Prednisone     NO PROBLEM WITH MEDROL DOSE PAK    Current Outpatient Prescriptions on File Prior to Visit  Medication Sig Dispense Refill  . acetaminophen (TYLENOL) 500 MG tablet Take 500 mg by mouth as needed.    Marland Kitchen albuterol (PROVENTIL HFA;VENTOLIN HFA) 108 (90 BASE) MCG/ACT inhaler Inhale 2 puffs into the lungs. As needed only    .  albuterol (PROVENTIL) (2.5 MG/3ML) 0.083% nebulizer solution Take 2.5 mg by nebulization every 6 (six) hours as needed.    . bifidobacterium infantis (ALIGN) capsule Take 1 capsule by mouth daily. 21 capsule 0  . cetirizine (ZYRTEC) 10 MG tablet Take 10 mg by mouth as needed.     . Cholecalciferol (VITAMIN D3) 2000 UNITS TABS Take by mouth daily.    Marland Kitchen EPINEPHrine (EPI-PEN) 0.3 mg/0.3 mL DEVI Inject 0.3 mLs (0.3 mg total) into the muscle once. 1 Device 0  . esomeprazole (NEXIUM) 40 MG capsule Take 40 mg by mouth as needed. She uses prn to alternate between this and the omeprazole    . Flaxseed, Linseed, (GNP FLAX SEED OIL PO) Take 1 mL by mouth daily.    . fluticasone (FLONASE) 50 MCG/ACT nasal spray INSTILL 2 SPRAYS IN EACH NOSTRIL ONCE DAILY 48 g 3  . GuaiFENesin (MUCINEX PO) Take by mouth as needed.     . hyoscyamine (LEVSIN SL) 0.125 MG SL tablet Place 0.125 mg under the tongue every 6 (six) hours as needed. 1 under tongue every 6 hrs prn for abdominal pain     . Magnesium 250 MG TABS Take 1 tablet by mouth daily.     . metoprolol tartrate (LOPRESSOR) 25 MG tablet TAKE ONE TABLET BY MOUTH TWICE DAILY **TAKE  ONE  EXTRA  TABLET  AS  NEEDED** 270 tablet 4  . montelukast (SINGULAIR) 10 MG tablet TAKE ONE TABLET BY MOUTH EVERY DAY 90 tablet 3  . nystatin-triamcinolone (MYCOLOG II) cream Apply 1 application topically as needed.    Marland Kitchen omeprazole (PRILOSEC) 40 MG capsule Take 1 capsule (40 mg total) by mouth daily. 90 capsule 3  . ranitidine (ZANTAC) 150 MG tablet Take 150 mg by mouth at bedtime.     . traMADol (ULTRAM) 50 MG tablet Take 1 tablet (50 mg total) by mouth every 6 (six) hours as needed. 40 tablet 0  . travoprost, benzalkonium, (TRAVATAN) 0.004 % ophthalmic solution Place 1 drop into both eyes at bedtime.     No current facility-administered medications on file prior to visit.    BP 186/83 mmHg  Pulse 61  Temp(Src) 98.9 F (37.2 C) (Oral)  Ht 5' 2.5" (1.588 m)  Wt 158 lb (71.668  kg)   BMI 28.42 kg/m2  SpO2 99%      Objective:   Physical Exam  General Mental Status- Alert. General Appearance- Not in acute distress.   Skin Rt thumb- Bright red medial side of thumb mid aspect and tender(no fluctuance to area) no dc. Some warmth. Faint pink streak toward wrist. No lymphadenopathy of forearm.  Neck Carotid Arteries- Normal color. Moisture- Normal Moisture. No carotid bruits. No JVD.   Chest and Lung Exam Auscultation: Breath Sounds:-Normal.  Cardiovascular Auscultation:Rythm- Regular. Murmurs & Other Heart Sounds:Auscultation of the heart reveals- No Murmurs. .    Neurologic Cranial Nerve exam:- CN III-XII intact(No nystagmus), symmetric smile. Drift Test:- No drift. Romberg Exam:- Negative.  Finger to Nose:- Normal/Intact Strength:- 5/5 equal and symmetric strength both upper and lower extremities.       Assessment & Plan:

## 2014-10-20 NOTE — Assessment & Plan Note (Signed)
Some elevation above normal today. Normal neuro exam. Check bp tomorrow twice. Document readings. If still elevated on Friday will need to add med to your regimen. If worsening neuruologic or cardiac type symptoms then ED eval.

## 2014-10-20 NOTE — Patient Instructions (Signed)
Cellulitis You do appear to have cellulitis. I am prescribing a antibiotic doxycycline. You should gradually improve. If you have expanding area of redness, fevers, or chills after hours or over the weekend then UC or ED evaluation. Note that  abscess may  form and if that occurs then you may need incision and drainage.   Location on rt hand and over thumb needs to be followed closely. If any complication then may refer to hand specialist.  Follow up in 2 days(please schedule for 8 am) or as needed    Hypertension Some elevation above normal today. Normal neuro exam. Check bp tomorrow twice. Document readings. If still elevated on Friday will need to add med to your regimen. If worsening neuruologic or cardiac type symptoms then ED eval.

## 2014-10-20 NOTE — Assessment & Plan Note (Signed)
You do appear to have cellulitis. I am prescribing a antibiotic doxycycline. You should gradually improve. If you have expanding area of redness, fevers, or chills after hours or over the weekend then UC or ED evaluation. Note that  abscess may  form and if that occurs then you may need incision and drainage.   Location on rt hand and over thumb needs to be followed closely. If any complication then may refer to hand specialist.  Follow up in 2 days(please schedule for 8 am) or as needed

## 2014-10-22 ENCOUNTER — Ambulatory Visit (INDEPENDENT_AMBULATORY_CARE_PROVIDER_SITE_OTHER): Payer: Commercial Managed Care - HMO | Admitting: Medical

## 2014-10-22 ENCOUNTER — Encounter: Payer: Self-pay | Admitting: Medical

## 2014-10-22 VITALS — BP 152/78 | HR 59 | Temp 98.6°F | Ht 62.5 in | Wt 158.0 lb

## 2014-10-22 DIAGNOSIS — L03113 Cellulitis of right upper limb: Secondary | ICD-10-CM

## 2014-10-22 NOTE — Patient Instructions (Addendum)
Cellulitis Much improved. This should gradually improve over next 5 days. If any residual or worsening signs or symptoms toward end of next week come in. Or return if area worsens. Continue doxy.   Continue to check your bp. If trend always above 140/90 then would need to add other medication.

## 2014-10-22 NOTE — Progress Notes (Signed)
Pre visit review using our clinic review tool, if applicable. No additional management support is needed unless otherwise documented below in the visit note. 

## 2014-10-22 NOTE — Progress Notes (Signed)
Subjective:    Patient ID: Kayla [name], female    DOB: 07/23/1946, 69 y.o.   MRN: [id number]  HPI   Pt states the thumb is getting better. Now she can now grap things without extreme pain. Before thumb was more swollen and tight. She could not bend thumb. No swelling decreased and she can bend thumb. The faint pink streak up to her rt wrist is no longer present.   Pt bp is much better today as well. Bp check yesterday 137/70.      Review of Systems  Constitutional: Negative for fever, chills and fatigue.  Respiratory: Negative for cough, choking, shortness of breath and wheezing.   Cardiovascular: Negative for chest pain and palpitations.  Musculoskeletal: Negative for back pain.  Skin:       Rt thumb is a lot better. Still faint red  Neurological: Negative for dizziness, syncope, speech difficulty, weakness, numbness and headaches.    Past Medical History  Diagnosis Date  . Asthma   . GERD (gastroesophageal reflux disease)     gastric polyp x3  . Osteopenia     last 07/2011  . Fibromyalgia   . Glaucoma      Dr Bing Plume  . IBS (irritable bowel syndrome)   . Diverticulosis   . Pneumonia      OP as child  . Diverticulitis   . Fasting hyperglycemia 04/2011    FBS 108  . Fundic gland polyps of stomach, benign   . CAP (community acquired pneumonia) 08/2012    Dr Birdie Riddle  . Shingles     History   Social History  . Marital Status: Married    Spouse Name: N/A  . Number of Children: N/A  . Years of Education: N/A   Occupational History  . Not on file.   Social History Main Topics  . Smoking status: Former Smoker -- 0.50 packs/day for 10 years    Types: Cigarettes    Quit date: 09/10/1974  . Smokeless tobacco: Never Used     Comment: smoked 1966- 1976, up to 1 ppd  . Alcohol Use: No  . Drug Use: No  . Sexual Activity: Not on file   Other Topics Concern  . Not on file   Social History Narrative   Daily caffeine 3 cups   Regular exercise   Married         Past Surgical History  Procedure Laterality Date  . Spine surgery      T9-L5 fusions  . Colonoscopy w/ polypectomy  [id number]    last  colonoscopy 2007, Dr Carlean Purl  . Esophagogastroduodenoscopy      Family History  Problem Relation Age of Onset  . Breast cancer Mother   . Breast cancer Sister   . Breast cancer Maternal Aunt     two  . Breast cancer Paternal Aunt   . Asthma Paternal Aunt   . Heart failure Father     CHF  . Kidney disease Maternal Grandmother   . Ovarian cancer      Niece x2  . Coronary artery disease Paternal Aunt     triple CABG  . Heart attack Paternal Grandmother     MI in late 52s  . Heart disease Paternal Grandfather   . Heart attack Paternal Uncle      MI in 70s  . Heart disease Maternal Aunt   . Diabetes Neg Hx   . Stroke Neg Hx   . COPD Neg Hx   . Breast  cancer      niece    Allergies  Allergen Reactions  . Cephalosporins     Blisters  orally  . Gabapentin     REACTION: rash  . Levofloxacin     REACTION: stomach ache and rash  . Psyllium     REACTION: rash  . Sulfonamide Derivatives     REACTION: shock, urticaria  . Aspirin     REACTION: stomach pain  . Belladonna   . Ciprofloxacin Hcl   . Conjugated Estrogens   . Nabumetone   . Nsaids   . Zicam Cold Remedy [Erysidoron #1]   . Prednisone     NO PROBLEM WITH MEDROL DOSE PAK    Current Outpatient Prescriptions on File Prior to Visit  Medication Sig Dispense Refill  . acetaminophen (TYLENOL) 500 MG tablet Take 500 mg by mouth as needed.    Marland Kitchen albuterol (PROVENTIL HFA;VENTOLIN HFA) 108 (90 BASE) MCG/ACT inhaler Inhale 2 puffs into the lungs. As needed only    . albuterol (PROVENTIL) (2.5 MG/3ML) 0.083% nebulizer solution Take 2.5 mg by nebulization every 6 (six) hours as needed.    . bifidobacterium infantis (ALIGN) capsule Take 1 capsule by mouth daily. 21 capsule 0  . calcium-vitamin D (OSCAL WITH D) 250-125 MG-UNIT per tablet Take 1 tablet by mouth daily.    .  cetirizine (ZYRTEC) 10 MG tablet Take 10 mg by mouth as needed.     . Cholecalciferol (VITAMIN D3) 2000 UNITS TABS Take by mouth daily.    Marland Kitchen doxycycline (VIBRA-TABS) 100 MG tablet Take 1 tablet (100 mg total) by mouth 2 (two) times daily. 20 tablet 0  . EPINEPHrine (EPI-PEN) 0.3 mg/0.3 mL DEVI Inject 0.3 mLs (0.3 mg total) into the muscle once. 1 Device 0  . esomeprazole (NEXIUM) 40 MG capsule Take 40 mg by mouth as needed. She uses prn to alternate between this and the omeprazole    . Flaxseed, Linseed, (GNP FLAX SEED OIL PO) Take 1 mL by mouth daily.    . fluticasone (FLONASE) 50 MCG/ACT nasal spray INSTILL 2 SPRAYS IN EACH NOSTRIL ONCE DAILY 48 g 3  . GuaiFENesin (MUCINEX PO) Take by mouth as needed.     . hyoscyamine (LEVSIN SL) 0.125 MG SL tablet Place 0.125 mg under the tongue every 6 (six) hours as needed. 1 under tongue every 6 hrs prn for abdominal pain     . Magnesium 250 MG TABS Take 1 tablet by mouth daily.     . metoprolol tartrate (LOPRESSOR) 25 MG tablet TAKE ONE TABLET BY MOUTH TWICE DAILY **TAKE  ONE  EXTRA  TABLET  AS  NEEDED** 270 tablet 4  . montelukast (SINGULAIR) 10 MG tablet TAKE ONE TABLET BY MOUTH EVERY DAY 90 tablet 3  . nystatin-triamcinolone (MYCOLOG II) cream Apply 1 application topically as needed.    . Omega-3 Fatty Acids (FISH OIL PO) Take by mouth.    Marland Kitchen omeprazole (PRILOSEC) 40 MG capsule Take 1 capsule (40 mg total) by mouth daily. 90 capsule 3  . ranitidine (ZANTAC) 150 MG tablet Take 150 mg by mouth at bedtime.     . traMADol (ULTRAM) 50 MG tablet Take 1 tablet (50 mg total) by mouth every 6 (six) hours as needed. 40 tablet 0  . travoprost, benzalkonium, (TRAVATAN) 0.004 % ophthalmic solution Place 1 drop into both eyes at bedtime.     No current facility-administered medications on file prior to visit.    BP 152/78 mmHg  Pulse 59  Temp(Src) 98.6 F (37 C) (Oral)  Ht 5' 2.5" (1.588 m)  Wt 158 lb (71.668 kg)  BMI 28.42 kg/m2  SpO2 100%        Objective:   Physical Exam  General- no acute distress. Rt thumb- the prior area of redness is less intense. Area of skin now soft. No warmth. Good range. (prior pink streak up to wrist is resolved)      Assessment & Plan:

## 2014-10-22 NOTE — Assessment & Plan Note (Signed)
Much improved. This should gradually improve over next 5 days. If any residual or worsening signs or symptoms toward end of next week come in. Or return if area worsens. Continue doxy.

## 2014-10-28 ENCOUNTER — Other Ambulatory Visit: Payer: Self-pay

## 2014-10-28 ENCOUNTER — Telehealth: Payer: Self-pay | Admitting: Family Medicine

## 2014-10-28 MED ORDER — DOXYCYCLINE HYCLATE 100 MG PO TABS: 100.0000 mg | ORAL_TABLET | Freq: Two times a day (BID) | ORAL | Status: DC

## 2014-10-28 NOTE — Telephone Encounter (Signed)
Caller name: Pauleen Relation to pt: self Call back number: [phone] Pharmacy: [facility]   Reason for call:   Patient states that she was told to report back to [facility] if cellulitis is not any better. It is a lot better but still has a tiny small blister(spot). Not completely cleared

## 2014-10-28 NOTE — Telephone Encounter (Signed)
lpn will call pt and see if area tender at all on palpation. Will help decide if will give topical or 3 more days oral antibiotic.

## 2014-10-28 NOTE — Telephone Encounter (Signed)
Patient agreed to 3 additional days supply of antibiotic. Sent to [place] pharmacy per patient request.#6 tablets with 0 refills.

## 2014-11-01 ENCOUNTER — Ambulatory Visit (INDEPENDENT_AMBULATORY_CARE_PROVIDER_SITE_OTHER): Payer: Commercial Managed Care - HMO | Admitting: Medical

## 2014-11-01 ENCOUNTER — Encounter: Payer: Self-pay | Admitting: Medical

## 2014-11-01 VITALS — BP 152/70 | HR 54 | Temp 98.2°F | Ht 62.5 in | Wt 157.8 lb

## 2014-11-01 DIAGNOSIS — L03119 Cellulitis of unspecified part of limb: Secondary | ICD-10-CM | POA: Diagnosis not present

## 2014-11-01 MED ORDER — DOXYCYCLINE HYCLATE 100 MG PO TABS: 100.0000 mg | ORAL_TABLET | Freq: Two times a day (BID) | ORAL | Status: DC

## 2014-11-01 NOTE — Patient Instructions (Signed)
Cellulitis Over her rt thumb area. Recent flare since last visit. Will refer to orthopedist due to possibility of small abscess. Already treated here overall for 12 days. Being on dominant hand and over joint want referral to hand specialist.  Rx doxycycline for 3 more days pending referral. Pt may need switch to different antibiotic or I +D.     Follow up here post hand specialist. If any rapid worsening prior to hand specialist such as redness or streaking up ar then ED evaluation.

## 2014-11-01 NOTE — Progress Notes (Signed)
Pre visit review using our clinic review tool, if applicable. No additional management support is needed unless otherwise documented below in the visit note. 

## 2014-11-01 NOTE — Assessment & Plan Note (Signed)
Over her rt thumb area. Recent flare since last visit. Will refer to orthopedist due to possibility of small abscess. Already treated here overall for 12 days. Being on dominant hand and over joint want referral to hand specialist.  Rx doxycycline for 3 more days pending referral. Pt may need switch to different antibiotic or I +D.

## 2014-11-01 NOTE — Progress Notes (Signed)
Subjective:    Patient ID: Kayla [name], female    DOB: [birth year], 69 y.o.   MRN: [id number]  HPI    Pt had some residual tenderness and redness of her rt thumb. I got a call back toward the end of the week. I called in 3 days of extra doxycycline. Pt is in today. Stating the area feels about the same as on Friday. Pt initially treated by me on the 10-20-2014.    Review of Systems  Constitutional: Negative for fever, chills and fatigue.  Respiratory: Negative for cough, chest tightness and shortness of breath.   Cardiovascular: Negative for chest pain and palpitations.  Skin:       Rt thumb pain and red over medial aspect.   Past Medical History  Diagnosis Date  . Asthma   . GERD (gastroesophageal reflux disease)     gastric polyp x3  . Osteopenia     last 07/2011  . Fibromyalgia   . Glaucoma      Dr Bing Plume  . IBS (irritable bowel syndrome)   . Diverticulosis   . Pneumonia      OP as child  . Diverticulitis   . Fasting hyperglycemia 04/2011    FBS 108  . Fundic gland polyps of stomach, benign   . CAP (community acquired pneumonia) 08/2012    Dr Birdie Riddle  . Shingles     History   Social History  . Marital Status: Married    Spouse Name: N/A  . Number of Children: N/A  . Years of Education: N/A   Occupational History  . Not on file.   Social History Main Topics  . Smoking status: Former Smoker -- 0.50 packs/day for 10 years    Types: Cigarettes    Quit date: 09/10/1974  . Smokeless tobacco: Never Used     Comment: smoked 1966- 1976, up to 1 ppd  . Alcohol Use: No  . Drug Use: No  . Sexual Activity: Not on file   Other Topics Concern  . Not on file   Social History Narrative   Daily caffeine 3 cups   Regular exercise   Married          Past Surgical History  Procedure Laterality Date  . Spine surgery      T9-L5 fusions  . Colonoscopy w/ polypectomy  [phone]    last  colonoscopy 2007, Dr Carlean Purl  . Esophagogastroduodenoscopy       Family History  Problem Relation Age of Onset  . Breast cancer Mother   . Breast cancer Sister   . Breast cancer Maternal Aunt     two  . Breast cancer Paternal Aunt   . Asthma Paternal Aunt   . Heart failure Father     CHF  . Kidney disease Maternal Grandmother   . Ovarian cancer      Niece x2  . Coronary artery disease Paternal Aunt     triple CABG  . Heart attack Paternal Grandmother     MI in late 26s  . Heart disease Paternal Grandfather   . Heart attack Paternal Uncle      MI in 69s  . Heart disease Maternal Aunt   . Diabetes Neg Hx   . Stroke Neg Hx   . COPD Neg Hx   . Breast cancer      niece    Allergies  Allergen Reactions  . Cephalosporins     Blisters  orally  . Gabapentin  REACTION: rash  . Levofloxacin     REACTION: stomach ache and rash  . Psyllium     REACTION: rash  . Sulfonamide Derivatives     REACTION: shock, urticaria  . Aspirin     REACTION: stomach pain  . Belladonna   . Ciprofloxacin Hcl   . Conjugated Estrogens   . Nabumetone   . Nsaids   . Zicam Cold Remedy [Erysidoron #1]   . Prednisone     NO PROBLEM WITH MEDROL DOSE PAK    Current Outpatient Prescriptions on File Prior to Visit  Medication Sig Dispense Refill  . acetaminophen (TYLENOL) 500 MG tablet Take 500 mg by mouth as needed.    Marland Kitchen albuterol (PROVENTIL HFA;VENTOLIN HFA) 108 (90 BASE) MCG/ACT inhaler Inhale 2 puffs into the lungs. As needed only    . albuterol (PROVENTIL) (2.5 MG/3ML) 0.083% nebulizer solution Take 2.5 mg by nebulization every 6 (six) hours as needed.    . bifidobacterium infantis (ALIGN) capsule Take 1 capsule by mouth daily. 21 capsule 0  . calcium-vitamin D (OSCAL WITH D) 250-125 MG-UNIT per tablet Take 1 tablet by mouth daily.    . cetirizine (ZYRTEC) 10 MG tablet Take 10 mg by mouth as needed.     . Cholecalciferol (VITAMIN D3) 2000 UNITS TABS Take by mouth daily.    Marland Kitchen doxycycline (VIBRA-TABS) 100 MG tablet Take 1 tablet (100 mg total) by mouth  2 (two) times daily. 6 tablet 0  . EPINEPHrine (EPI-PEN) 0.3 mg/0.3 mL DEVI Inject 0.3 mLs (0.3 mg total) into the muscle once. 1 Device 0  . esomeprazole (NEXIUM) 40 MG capsule Take 40 mg by mouth as needed. She uses prn to alternate between this and the omeprazole    . Flaxseed, Linseed, (GNP FLAX SEED OIL PO) Take 1 mL by mouth daily.    . fluticasone (FLONASE) 50 MCG/ACT nasal spray INSTILL 2 SPRAYS IN EACH NOSTRIL ONCE DAILY 48 g 3  . GuaiFENesin (MUCINEX PO) Take by mouth as needed.     . hyoscyamine (LEVSIN SL) 0.125 MG SL tablet Place 0.125 mg under the tongue every 6 (six) hours as needed. 1 under tongue every 6 hrs prn for abdominal pain     . Magnesium 250 MG TABS Take 1 tablet by mouth daily.     . metoprolol tartrate (LOPRESSOR) 25 MG tablet TAKE ONE TABLET BY MOUTH TWICE DAILY **TAKE  ONE  EXTRA  TABLET  AS  NEEDED** 270 tablet 4  . montelukast (SINGULAIR) 10 MG tablet TAKE ONE TABLET BY MOUTH EVERY DAY 90 tablet 3  . nystatin-triamcinolone (MYCOLOG II) cream Apply 1 application topically as needed.    . Omega-3 Fatty Acids (FISH OIL PO) Take by mouth.    Marland Kitchen omeprazole (PRILOSEC) 40 MG capsule Take 1 capsule (40 mg total) by mouth daily. 90 capsule 3  . ranitidine (ZANTAC) 150 MG tablet Take 150 mg by mouth at bedtime.     . traMADol (ULTRAM) 50 MG tablet Take 1 tablet (50 mg total) by mouth every 6 (six) hours as needed. 40 tablet 0  . travoprost, benzalkonium, (TRAVATAN) 0.004 % ophthalmic solution Place 1 drop into both eyes at bedtime.     No current facility-administered medications on file prior to visit.    BP 152/70 mmHg  Pulse 54  Temp(Src) 98.2 F (36.8 C) (Oral)  Ht 5' 2.5" (1.588 m)  Wt 157 lb 12.8 oz (71.578 kg)  BMI 28.38 kg/m2  SpO2 94%  Objective:   Physical Exam  General - no acute distress. Rt thumb- good rom but mild swollen. Red and tender mid medial aspect over joint. Skin- redness over thumb but no streaking up arm.      Assessment &  Plan:

## 2014-11-02 ENCOUNTER — Telehealth: Payer: Self-pay | Admitting: Family Medicine

## 2014-11-02 ENCOUNTER — Encounter: Payer: Self-pay | Admitting: Family Medicine

## 2014-11-02 NOTE — Telephone Encounter (Signed)
Caller name:Annia Relation to pt: self Call back number: [phone] Pharmacy: [facility] [address] in [place].  Reason for call:   Patient states that she does not have appointment with [facility] yet regarding her hand and needs more antibiotics called in. Needs to specify capsules.

## 2014-11-02 NOTE — Telephone Encounter (Signed)
Pt does not need/was called into [facility]

## 2014-11-03 MED ORDER — METOPROLOL TARTRATE 25 MG PO TABS: ORAL_TABLET | ORAL | Status: DC

## 2014-11-04 NOTE — Telephone Encounter (Signed)
Pt has appt w/ Dr Caralyn Guile tomorrow

## 2014-11-04 NOTE — Telephone Encounter (Signed)
Hand specialist reviewed referral. I am not sure why they have not notified us of the appointment. Does the thumb look worse. If so let us know. Could pass along that info to specialist and they may see you sooner.

## 2014-11-10 ENCOUNTER — Ambulatory Visit (INDEPENDENT_AMBULATORY_CARE_PROVIDER_SITE_OTHER): Payer: Commercial Managed Care - HMO | Admitting: Family Medicine

## 2014-11-10 ENCOUNTER — Encounter: Payer: Self-pay | Admitting: Family Medicine

## 2014-11-10 VITALS — BP 124/80 | HR 55 | Temp 98.5°F | Resp 16 | Wt 155.5 lb

## 2014-11-10 DIAGNOSIS — L02214 Cutaneous abscess of groin: Secondary | ICD-10-CM

## 2014-11-10 HISTORY — DX: Cutaneous abscess of groin: L02.214

## 2014-11-10 MED ORDER — DOXYCYCLINE HYCLATE 100 MG PO TABS
100.0000 mg | ORAL_TABLET | Freq: Two times a day (BID) | ORAL | Status: DC
Start: 2014-11-10 — End: 2015-02-17

## 2014-11-10 NOTE — Progress Notes (Signed)
Pre visit review using our clinic review tool, if applicable. No additional management support is needed unless otherwise documented below in the visit note. 

## 2014-11-10 NOTE — Assessment & Plan Note (Signed)
New.  Pt w/o obvious area to I&D.  Start Doxy.  Hot soaks.  Reviewed supportive care and red flags that should prompt return.  Pt expressed understanding and is in agreement w/ plan.

## 2014-11-10 NOTE — Progress Notes (Signed)
   Subjective:    Patient ID: Kayla [name], female    DOB: 10/26/1945, 69 y.o.   MRN: [id number]  HPI Ingrown hair- pt reports 'tiny pimple' in the grown area.  2-3 days ago had pain w/ washing and felt a hard lump.  Worsened as the day went on- particularly w/ sitting.  Area is red.  Pt has tried nystatin/steroid combo cream.  No drainage.   Review of Systems For ROS see HPI     Objective:   Physical Exam  Constitutional: She appears well-developed and well-nourished. No distress.  Skin: Skin is warm and dry. There is erythema (overlying L outer labia/groin abscess w/ tiny amount of drainage from central pore but no obvious fluid pocket to be drained).          Assessment & Plan:

## 2014-11-10 NOTE — Patient Instructions (Addendum)
Follow up as needed Start the Doxycycline twice daily- take w/ food Start hot compresses/soaks as often as possible If the area starts draining- this is good! If worsening pain or redness- please call Call with any questions or concerns Hang in there!!!

## 2014-11-13 LAB — WOUND CULTURE
GRAM STAIN: NONE SEEN
Gram Stain: NONE SEEN

## 2014-11-15 ENCOUNTER — Encounter: Payer: Self-pay | Admitting: Family Medicine

## 2014-11-18 ENCOUNTER — Telehealth: Payer: Self-pay | Admitting: Family Medicine

## 2014-11-18 NOTE — Telephone Encounter (Signed)
Caller name: Christle Relation to pt: self Call back number: [phone] Pharmacy: [facility]  Reason for call:   Patient states that the boil is better but has never drained. She states that tomorrow is the last day on antibiotics and wants to know what else she should do? Saw Dr. Birdie Riddle for this.

## 2014-11-18 NOTE — Telephone Encounter (Signed)
Pt notified, and advised that if any redness comes back or if pain restarts she should be evaluated. Pt advised that this could just take time to reabsorb. Pt advised that it has diminished in size, redness, and she no longer has pain there.

## 2014-11-18 NOTE — Telephone Encounter (Signed)
Is the boil still present?  There may not be anything that needs to drain.  Often these will heal and the fluid will reabsorb w/o draining.

## 2015-02-17 ENCOUNTER — Encounter: Payer: Self-pay | Admitting: Family Medicine

## 2015-02-17 ENCOUNTER — Ambulatory Visit (INDEPENDENT_AMBULATORY_CARE_PROVIDER_SITE_OTHER): Payer: Commercial Managed Care - HMO | Admitting: Family Medicine

## 2015-02-17 VITALS — BP 132/76 | HR 68 | Temp 99.2°F | Ht 62.0 in | Wt 157.4 lb

## 2015-02-17 DIAGNOSIS — E785 Hyperlipidemia, unspecified: Secondary | ICD-10-CM | POA: Diagnosis not present

## 2015-02-17 DIAGNOSIS — T148 Other injury of unspecified body region: Secondary | ICD-10-CM | POA: Diagnosis not present

## 2015-02-17 DIAGNOSIS — I10 Essential (primary) hypertension: Secondary | ICD-10-CM | POA: Diagnosis not present

## 2015-02-17 DIAGNOSIS — R21 Rash and other nonspecific skin eruption: Secondary | ICD-10-CM

## 2015-02-17 DIAGNOSIS — Z23 Encounter for immunization: Secondary | ICD-10-CM

## 2015-02-17 DIAGNOSIS — IMO0001 Reserved for inherently not codable concepts without codable children: Secondary | ICD-10-CM

## 2015-02-17 LAB — LIPID PANEL
CHOL/HDL RATIO: 3
CHOLESTEROL: 220 mg/dL — AB (ref 0–200)
HDL: 65.3 mg/dL (ref >39.00)
LDL Cholesterol: 125 mg/dL — ABNORMAL HIGH (ref 0–99)
NONHDL: 154.7
TRIGLYCERIDES: 149 mg/dL (ref 0.0–149.0)
VLDL: 29.8 mg/dL (ref 0.0–40.0)

## 2015-02-17 LAB — HEPATIC FUNCTION PANEL
ALT: 18 U/L (ref 0–35)
AST: 25 U/L (ref 0–37)
Albumin: 4.5 g/dL (ref 3.5–5.2)
Alkaline Phosphatase: 91 U/L (ref 39–117)
BILIRUBIN TOTAL: 0.5 mg/dL (ref 0.2–1.2)
Bilirubin, Direct: 0 mg/dL (ref 0.0–0.3)
TOTAL PROTEIN: 7.3 g/dL (ref 6.0–8.3)

## 2015-02-17 LAB — BASIC METABOLIC PANEL
BUN: 12 mg/dL (ref 6–23)
CO2: 29 meq/L (ref 19–32)
Calcium: 9.6 mg/dL (ref 8.4–10.5)
Chloride: 98 mEq/L (ref 96–112)
Creatinine, Ser: 0.67 mg/dL (ref 0.40–1.20)
GFR: 92.63 mL/min (ref >60.00)
Glucose, Bld: 92 mg/dL (ref 70–99)
POTASSIUM: 3.9 meq/L (ref 3.5–5.1)
Sodium: 133 mEq/L — ABNORMAL LOW (ref 135–145)

## 2015-02-17 MED ORDER — TRIAMCINOLONE ACETONIDE 0.1 % EX CREA: 1.0000 "application " | TOPICAL_CREAM | Freq: Two times a day (BID) | CUTANEOUS | Status: DC

## 2015-02-17 MED ORDER — NYSTATIN 100000 UNIT/GM EX CREA: 1.0000 "application " | TOPICAL_CREAM | Freq: Two times a day (BID) | CUTANEOUS | Status: DC

## 2015-02-17 NOTE — Progress Notes (Signed)
Subjective:    Patient ID: Kayla [name], female    DOB: May 27, 1946, 69 y.o.   MRN: [id number]  HPI  Patient here c/o bulge L side of neck that is occasionally achy--comes and goes. She also want today to be f/u for htn, and cholesterol.  No other complaints.    Past Medical History  Diagnosis Date  . Asthma   . GERD (gastroesophageal reflux disease)     gastric polyp x3  . Osteopenia     last 07/2011  . Fibromyalgia   . Glaucoma      Dr Bing Plume  . IBS (irritable bowel syndrome)   . Diverticulosis   . Pneumonia      OP as child  . Diverticulitis   . Fasting hyperglycemia 04/2011    FBS 108  . Fundic gland polyps of stomach, benign   . CAP (community acquired pneumonia) 08/2012    Dr Birdie Riddle  . Shingles     Review of Systems  Constitutional: Negative for activity change, appetite change, fatigue and unexpected weight change.  Respiratory: Negative for cough and shortness of breath.   Cardiovascular: Negative for chest pain and palpitations.  Neurological: Positive for light-headedness. Negative for dizziness.  Hematological: Negative for adenopathy. Does not bruise/bleed easily.  Psychiatric/Behavioral: Negative for behavioral problems and dysphoric mood. The patient is not nervous/anxious.     Current Outpatient Prescriptions on File Prior to Visit  Medication Sig Dispense Refill  . acetaminophen (TYLENOL) 500 MG tablet Take 500 mg by mouth as needed.    Marland Kitchen albuterol (PROVENTIL HFA;VENTOLIN HFA) 108 (90 BASE) MCG/ACT inhaler Inhale 2 puffs into the lungs. As needed only    . albuterol (PROVENTIL) (2.5 MG/3ML) 0.083% nebulizer solution Take 2.5 mg by nebulization every 6 (six) hours as needed.    . bifidobacterium infantis (ALIGN) capsule Take 1 capsule by mouth daily. 21 capsule 0  . cetirizine (ZYRTEC) 10 MG tablet Take 10 mg by mouth as needed.     . Cholecalciferol (VITAMIN D3) 2000 UNITS TABS Take by mouth daily.    Marland Kitchen EPINEPHrine (EPI-PEN) 0.3 mg/0.3 mL DEVI  Inject 0.3 mLs (0.3 mg total) into the muscle once. 1 Device 0  . esomeprazole (NEXIUM) 40 MG capsule Take 40 mg by mouth as needed. She uses prn to alternate between this and the omeprazole    . Flaxseed, Linseed, (GNP FLAX SEED OIL PO) Take 1 mL by mouth daily.    . fluticasone (FLONASE) 50 MCG/ACT nasal spray INSTILL 2 SPRAYS IN EACH NOSTRIL ONCE DAILY 48 g 3  . GuaiFENesin (MUCINEX PO) Take by mouth as needed.     . hyoscyamine (LEVSIN SL) 0.125 MG SL tablet Place 0.125 mg under the tongue every 6 (six) hours as needed. 1 under tongue every 6 hrs prn for abdominal pain     . metoprolol tartrate (LOPRESSOR) 25 MG tablet TAKE ONE TABLET BY MOUTH TWICE DAILY **TAKE  ONE  EXTRA  TABLET  AS  NEEDED** 270 tablet 3  . montelukast (SINGULAIR) 10 MG tablet TAKE ONE TABLET BY MOUTH EVERY DAY 90 tablet 3  . Multiple Minerals-Vitamins (CALCIUM CITRATE PLUS/MAGNESIUM PO) Take by mouth.    . nystatin-triamcinolone (MYCOLOG II) cream Apply 1 application topically as needed.    . Omega-3 Fatty Acids (FISH OIL PO) Take by mouth.    Marland Kitchen omeprazole (PRILOSEC) 40 MG capsule Take 1 capsule (40 mg total) by mouth daily. 90 capsule 3  . ranitidine (ZANTAC) 150 MG tablet Take 150  mg by mouth at bedtime.     . traMADol (ULTRAM) 50 MG tablet Take 1 tablet (50 mg total) by mouth every 6 (six) hours as needed. 40 tablet 0  . travoprost, benzalkonium, (TRAVATAN) 0.004 % ophthalmic solution Place 1 drop into both eyes at bedtime.     No current facility-administered medications on file prior to visit.       Objective:    Physical Exam  Constitutional: She is oriented to person, place, and time. She appears well-developed and well-nourished.  HENT:  Head: Normocephalic and atraumatic.  Eyes: Conjunctivae and EOM are normal.  Neck: Normal range of motion. Neck supple. No JVD present. Carotid bruit is not present. No thyromegaly present.    Cardiovascular: Normal rate, regular rhythm and normal heart sounds.   No  murmur heard. Pulmonary/Chest: Effort normal and breath sounds normal. No respiratory distress. She has no wheezes. She has no rales. She exhibits no tenderness.  Musculoskeletal: She exhibits no edema.  Lymphadenopathy:    She has no cervical adenopathy.  Neurological: She is alert and oriented to person, place, and time.  Psychiatric: She has a normal mood and affect.    BP 132/76 mmHg  Pulse 68  Temp(Src) 99.2 F (37.3 C) (Oral)  Ht 5\' 2"  (1.575 m)  Wt 157 lb 6.4 oz (71.396 kg)  BMI 28.78 kg/m2  SpO2 97% Wt Readings from Last 3 Encounters:  02/17/15 157 lb 6.4 oz (71.396 kg)  11/10/14 155 lb 8 oz (70.534 kg)  11/01/14 157 lb 12.8 oz (71.578 kg)     Lab Results  Component Value Date   WBC 3.9* 01/11/2014   HGB 13.2 01/11/2014   HCT 39.0 01/11/2014   PLT 173.0 01/11/2014   GLUCOSE 92 02/17/2015   CHOL 220* 02/17/2015   TRIG 149.0 02/17/2015   HDL 65.30 02/17/2015   LDLDIRECT 153.3 12/18/2012   LDLCALC 125* 02/17/2015   ALT 18 02/17/2015   AST 25 02/17/2015   NA 133* 02/17/2015   K 3.9 02/17/2015   CL 98 02/17/2015   CREATININE 0.67 02/17/2015   BUN 12 02/17/2015   CO2 29 02/17/2015   TSH 1.22 12/18/2012   INR 0.97 12/15/2009   HGBA1C 5.4 11/01/2011       Assessment & Plan:   Problem List Items Addressed This Visit    Hyperlipidemia - Primary   Relevant Orders   Basic metabolic panel (Completed)   Hepatic function panel (Completed)   Lipid panel (Completed)    Other Visit Diagnoses    Essential hypertension        Relevant Orders    Basic metabolic panel (Completed)    Hepatic function panel (Completed)    Lipid panel (Completed)    Rash and nonspecific skin eruption        Relevant Medications    triamcinolone cream (KENALOG) 0.1 %    nystatin cream (MYCOSTATIN)    Cut        Relevant Orders    Tdap vaccine greater than or equal to 7yo IM (Completed)       I have discontinued Ms. Kopecky's doxycycline. I am also having her start on  triamcinolone cream and nystatin cream. Additionally, I am having her maintain her GuaiFENesin (MUCINEX PO), hyoscyamine, EPINEPHrine, traMADol, ranitidine, acetaminophen, Vitamin D3, albuterol, cetirizine, albuterol, esomeprazole, bifidobacterium infantis, nystatin-triamcinolone, fluticasone, omeprazole, travoprost (benzalkonium), (Flaxseed, Linseed, (GNP FLAX SEED OIL PO)), montelukast, Omega-3 Fatty Acids (FISH OIL PO), metoprolol tartrate, and Multiple Minerals-Vitamins (CALCIUM CITRATE PLUS/MAGNESIUM PO).  Meds  ordered this encounter  Medications  . triamcinolone cream (KENALOG) 0.1 %    Sig: Apply 1 application topically 2 (two) times daily.    Dispense:  90 g    Refill:  3  . nystatin cream (MYCOSTATIN)    Sig: Apply 1 application topically 2 (two) times daily.    Dispense:  90 g    Refill:  [place], DO

## 2015-02-17 NOTE — Progress Notes (Signed)
Pre visit review using our clinic review tool, if applicable. No additional management support is needed unless otherwise documented below in the visit note. 

## 2015-02-17 NOTE — Patient Instructions (Addendum)

## 2015-02-18 ENCOUNTER — Telehealth: Payer: Self-pay | Admitting: Family Medicine

## 2015-02-18 DIAGNOSIS — R21 Rash and other nonspecific skin eruption: Secondary | ICD-10-CM

## 2015-02-18 MED ORDER — NYSTATIN 100000 UNIT/GM EX CREA: 1.0000 "application " | TOPICAL_CREAM | Freq: Two times a day (BID) | CUTANEOUS | Status: DC

## 2015-02-18 NOTE — Telephone Encounter (Signed)
Relation to pt: self   Call back number: [phone] Pharmacy: [facility]   Reason for call:    As per pt requesting a 90 day supply (so pt can have 0 copay) nystatin cream (MYCOSTATIN and triamcinolone cream (KENALOG) 0.1 %. Please send to  [place], [address] [place] [phone] (Phone) [phone] (Fax)

## 2015-02-18 NOTE — Telephone Encounter (Signed)
Rx was sent to the local pharmacy, I went ahead faxed to the mail order pharmacy for 90 days per patient request.     KP

## 2015-02-21 ENCOUNTER — Encounter: Payer: Self-pay | Admitting: Family Medicine

## 2015-02-22 ENCOUNTER — Other Ambulatory Visit: Payer: Self-pay | Admitting: Family Medicine

## 2015-02-22 DIAGNOSIS — T782XXS Anaphylactic shock, unspecified, sequela: Secondary | ICD-10-CM

## 2015-02-22 DIAGNOSIS — R21 Rash and other nonspecific skin eruption: Secondary | ICD-10-CM

## 2015-02-22 MED ORDER — NYSTATIN 100000 UNIT/GM EX CREA: 1.0000 "application " | TOPICAL_CREAM | Freq: Two times a day (BID) | CUTANEOUS | Status: DC

## 2015-02-22 MED ORDER — EPINEPHRINE 0.3 MG/0.3ML IJ SOAJ: INTRAMUSCULAR | Status: DC

## 2015-02-22 MED ORDER — TRIAMCINOLONE ACETONIDE 0.1 % EX CREA: 1.0000 "application " | TOPICAL_CREAM | Freq: Two times a day (BID) | CUTANEOUS | Status: DC

## 2015-03-07 ENCOUNTER — Ambulatory Visit: Payer: Commercial Managed Care - HMO | Admitting: Family Medicine

## 2015-03-09 ENCOUNTER — Telehealth: Payer: Self-pay | Admitting: Family Medicine

## 2015-03-09 DIAGNOSIS — H43399 Other vitreous opacities, unspecified eye: Secondary | ICD-10-CM

## 2015-03-09 NOTE — Telephone Encounter (Signed)
Ref placed.      KP 

## 2015-03-09 NOTE — Telephone Encounter (Signed)
Caller name: Jinny Blossom from [facility] Relation to pt: Call back number: [phone] Pharmacy:  Reason for call:   States that patient has an appointment on 7/6 and is requesting referral. Has [employer]. 6 week follow up with dilation Dr. Posey Pronto NPI# [id number] Dx : [phone]

## 2015-03-29 ENCOUNTER — Encounter: Payer: Self-pay | Admitting: Family Medicine

## 2015-03-29 ENCOUNTER — Other Ambulatory Visit: Payer: Self-pay | Admitting: Family Medicine

## 2015-03-29 DIAGNOSIS — R599 Enlarged lymph nodes, unspecified: Secondary | ICD-10-CM

## 2015-03-30 ENCOUNTER — Ambulatory Visit (HOSPITAL_BASED_OUTPATIENT_CLINIC_OR_DEPARTMENT_OTHER)
Admission: RE | Admit: 2015-03-30 | Discharge: 2015-03-30 | Disposition: A | Discharge status: Home / Self Care | Payer: Commercial Managed Care - HMO | Source: Ambulatory Visit | Attending: Family Medicine | Admitting: Family Medicine

## 2015-03-30 DIAGNOSIS — R599 Enlarged lymph nodes, unspecified: Secondary | ICD-10-CM

## 2015-03-30 DIAGNOSIS — R221 Localized swelling, mass and lump, neck: Secondary | ICD-10-CM | POA: Insufficient documentation

## 2015-05-06 ENCOUNTER — Telehealth: Payer: Self-pay

## 2015-05-06 NOTE — Telephone Encounter (Signed)
[employer] benefits verification. The site is in network for this patients plan. Admin and [employer] are subject to 20% co-insurance up to a $5000 out of pocket max ($0 met). If an office visit is billed , a $10 co-pay will apply. Once met, coverage increases to 100% contracted rate. No deductible applies. Co-pays do contribute to the OOP max. Once OOP max is met, co-pays will be waived. No referral required. PA is required. PA completed and approve through 04/08/16. The patient has been made aware and has declined the injection due to side effects. Dr.Lowne is aware.      KP

## 2015-05-26 ENCOUNTER — Ambulatory Visit (INDEPENDENT_AMBULATORY_CARE_PROVIDER_SITE_OTHER): Payer: Commercial Managed Care - HMO

## 2015-05-26 DIAGNOSIS — Z23 Encounter for immunization: Secondary | ICD-10-CM

## 2015-05-26 NOTE — Progress Notes (Signed)
Pt tolerated injection well

## 2015-05-26 NOTE — Progress Notes (Signed)
Pre visit review using our clinic review tool, if applicable. No additional management support is needed unless otherwise documented below in the visit note. 

## 2015-05-30 ENCOUNTER — Encounter: Payer: Self-pay | Admitting: Family Medicine

## 2015-05-31 ENCOUNTER — Telehealth: Payer: Self-pay | Admitting: Family Medicine

## 2015-05-31 DIAGNOSIS — E871 Hypo-osmolality and hyponatremia: Secondary | ICD-10-CM

## 2015-05-31 DIAGNOSIS — Z1159 Encounter for screening for other viral diseases: Secondary | ICD-10-CM

## 2015-05-31 DIAGNOSIS — E559 Vitamin D deficiency, unspecified: Secondary | ICD-10-CM

## 2015-05-31 NOTE — Telephone Encounter (Signed)
According to 03/09/15 visit: follow up in 6 mos and repeat BMP in 3 mos. She is requesting Hep C and Vitamin D Please advise     KP

## 2015-05-31 NOTE — Telephone Encounter (Signed)
Relation to MR:AJHH Call back number: [phone]   Reason for call:  Patient states at last appointment 02/17/2015 PCP wanted her to follow up in 3 month for labs only please advise? Patient also received mychart messages stating she was overdue for HEP C and requesting her Vitamin D to be checked as well. Please advise

## 2015-05-31 NOTE — Telephone Encounter (Signed)
Ok to add both---dx vita d def and need for hep c screening

## 2015-06-01 NOTE — Telephone Encounter (Signed)
Patient scheduled for 06/03/2015

## 2015-06-01 NOTE — Telephone Encounter (Signed)
The orders are in. Please schedule     KP

## 2015-06-03 ENCOUNTER — Other Ambulatory Visit (INDEPENDENT_AMBULATORY_CARE_PROVIDER_SITE_OTHER): Payer: Commercial Managed Care - HMO

## 2015-06-03 DIAGNOSIS — Z1159 Encounter for screening for other viral diseases: Secondary | ICD-10-CM

## 2015-06-03 DIAGNOSIS — E871 Hypo-osmolality and hyponatremia: Secondary | ICD-10-CM

## 2015-06-03 DIAGNOSIS — E559 Vitamin D deficiency, unspecified: Secondary | ICD-10-CM

## 2015-06-03 LAB — HEPATITIS C ANTIBODY: HCV Ab: NEGATIVE

## 2015-06-03 LAB — BASIC METABOLIC PANEL
BUN: 16 mg/dL (ref 6–23)
CALCIUM: 9.2 mg/dL (ref 8.4–10.5)
CHLORIDE: 103 meq/L (ref 96–112)
CO2: 29 meq/L (ref 19–32)
CREATININE: 0.74 mg/dL (ref 0.40–1.20)
GFR: 82.53 mL/min (ref >60.00)
GLUCOSE: 85 mg/dL (ref 70–99)
Potassium: 4.4 mEq/L (ref 3.5–5.1)
Sodium: 138 mEq/L (ref 135–145)

## 2015-06-06 ENCOUNTER — Encounter: Payer: Self-pay | Admitting: *Deleted

## 2015-06-06 ENCOUNTER — Encounter: Payer: Self-pay | Admitting: Family Medicine

## 2015-06-06 LAB — VITAMIN D 1,25 DIHYDROXY
Vitamin D 1, 25 (OH)2 Total: 61 pg/mL (ref 18–72)
Vitamin D2 1, 25 (OH)2: <8 pg/mL
Vitamin D3 1, 25 (OH)2: 61 pg/mL

## 2015-06-13 ENCOUNTER — Encounter: Payer: Self-pay | Admitting: Family Medicine

## 2015-06-16 ENCOUNTER — Encounter: Payer: Self-pay | Admitting: Family Medicine

## 2015-06-16 ENCOUNTER — Other Ambulatory Visit: Payer: Self-pay

## 2015-06-16 ENCOUNTER — Other Ambulatory Visit: Payer: Self-pay | Admitting: Family Medicine

## 2015-06-16 DIAGNOSIS — R221 Localized swelling, mass and lump, neck: Secondary | ICD-10-CM

## 2015-06-16 DIAGNOSIS — R21 Rash and other nonspecific skin eruption: Secondary | ICD-10-CM

## 2015-06-16 NOTE — Telephone Encounter (Signed)
We can refer her to derm

## 2015-06-23 ENCOUNTER — Other Ambulatory Visit: Payer: Self-pay | Admitting: Family Medicine

## 2015-06-23 DIAGNOSIS — J029 Acute pharyngitis, unspecified: Secondary | ICD-10-CM

## 2015-07-25 ENCOUNTER — Other Ambulatory Visit: Payer: Self-pay | Admitting: Family Medicine

## 2015-08-15 ENCOUNTER — Encounter: Payer: Self-pay | Admitting: Internal Medicine

## 2015-08-22 ENCOUNTER — Ambulatory Visit (INDEPENDENT_AMBULATORY_CARE_PROVIDER_SITE_OTHER): Payer: Commercial Managed Care - HMO | Admitting: Family Medicine

## 2015-08-22 ENCOUNTER — Encounter: Payer: Self-pay | Admitting: Family Medicine

## 2015-08-22 VITALS — BP 132/80 | HR 56 | Temp 98.3°F | Ht 62.0 in | Wt 160.0 lb

## 2015-08-22 DIAGNOSIS — I10 Essential (primary) hypertension: Secondary | ICD-10-CM

## 2015-08-22 DIAGNOSIS — Z0000 Encounter for general adult medical examination without abnormal findings: Secondary | ICD-10-CM | POA: Diagnosis not present

## 2015-08-22 DIAGNOSIS — J302 Other seasonal allergic rhinitis: Secondary | ICD-10-CM | POA: Diagnosis not present

## 2015-08-22 LAB — POCT URINALYSIS DIPSTICK
Bilirubin, UA: NEGATIVE
Blood, UA: NEGATIVE
GLUCOSE UA: NEGATIVE
KETONES UA: NEGATIVE
LEUKOCYTES UA: NEGATIVE
Nitrite, UA: NEGATIVE
Protein, UA: NEGATIVE
SPEC GRAV UA: 1.015
UROBILINOGEN UA: 0.2
pH, UA: 6

## 2015-08-22 MED ORDER — FLUTICASONE PROPIONATE 50 MCG/ACT NA SUSP: NASAL | Status: DC

## 2015-08-22 MED ORDER — ALBUTEROL SULFATE HFA 108 (90 BASE) MCG/ACT IN AERS: 2.0000 | INHALATION_SPRAY | Freq: Four times a day (QID) | RESPIRATORY_TRACT | Status: DC | PRN

## 2015-08-22 NOTE — Patient Instructions (Signed)
Preventive Care for Adults, Female A healthy lifestyle and preventive care can promote health and wellness. Preventive health guidelines for women include the following key practices.  A routine yearly physical is a good way to check with your health care provider about your health and preventive screening. It is a chance to share any concerns and updates on your health and to receive a thorough exam.  Visit your dentist for a routine exam and preventive care every 6 months. Brush your teeth twice a day and floss once a day. Good oral hygiene prevents tooth decay and gum disease.  The frequency of eye exams is based on your age, health, family medical history, use of contact lenses, and other factors. Follow your health care provider's recommendations for frequency of eye exams.  Eat a healthy diet. Foods like vegetables, fruits, whole grains, low-fat dairy products, and lean protein foods contain the nutrients you need without too many calories. Decrease your intake of foods high in solid fats, added sugars, and salt. Eat the right amount of calories for you.Get information about a proper diet from your health care provider, if necessary.  Regular physical exercise is one of the most important things you can do for your health. Most adults should get at least 150 minutes of moderate-intensity exercise (any activity that increases your heart rate and causes you to sweat) each week. In addition, most adults need muscle-strengthening exercises on 2 or more days a week.  Maintain a healthy weight. The body mass index (BMI) is a screening tool to identify possible weight problems. It provides an estimate of body fat based on height and weight. Your health care provider can find your BMI and can help you achieve or maintain a healthy weight.For adults 20 years and older:  A BMI below 18.5 is considered underweight.  A BMI of 18.5 to 24.9 is normal.  A BMI of 25 to 29.9 is considered overweight.  A  BMI of 30 and above is considered obese.  Maintain normal blood lipids and cholesterol levels by exercising and minimizing your intake of saturated fat. Eat a balanced diet with plenty of fruit and vegetables. Blood tests for lipids and cholesterol should begin at age 45 and be repeated every 5 years. If your lipid or cholesterol levels are high, you are over 50, or you are at high risk for heart disease, you may need your cholesterol levels checked more frequently.Ongoing high lipid and cholesterol levels should be treated with medicines if diet and exercise are not working.  If you smoke, find out from your health care provider how to quit. If you do not use tobacco, do not start.  Lung cancer screening is recommended for adults aged 45-80 years who are at high risk for developing lung cancer because of a history of smoking. A yearly low-dose CT scan of the lungs is recommended for people who have at least a 30-pack-year history of smoking and are a current smoker or have quit within the past 15 years. A pack year of smoking is smoking an average of 1 pack of cigarettes a day for 1 year (for example: 1 pack a day for 30 years or 2 packs a day for 15 years). Yearly screening should continue until the smoker has stopped smoking for at least 15 years. Yearly screening should be stopped for people who develop a health problem that would prevent them from having lung cancer treatment.  If you are pregnant, do not drink alcohol. If you are  breastfeeding, be very cautious about drinking alcohol. If you are not pregnant and choose to drink alcohol, do not have more than 1 drink per day. One drink is considered to be 12 ounces (355 mL) of beer, 5 ounces (148 mL) of wine, or 1.5 ounces (44 mL) of liquor.  Avoid use of street drugs. Do not share needles with anyone. Ask for help if you need support or instructions about stopping the use of drugs.  High blood pressure causes heart disease and increases the risk  of stroke. Your blood pressure should be checked at least every 1 to 2 years. Ongoing high blood pressure should be treated with medicines if weight loss and exercise do not work.  If you are 55-79 years old, ask your health care provider if you should take aspirin to prevent strokes.  Diabetes screening is done by taking a blood sample to check your blood glucose level after you have not eaten for a certain period of time (fasting). If you are not overweight and you do not have risk factors for diabetes, you should be screened once every 3 years starting at age 45. If you are overweight or obese and you are 40-70 years of age, you should be screened for diabetes every year as part of your cardiovascular risk assessment.  Breast cancer screening is essential preventive care for women. You should practice "breast self-awareness." This means understanding the normal appearance and feel of your breasts and may include breast self-examination. Any changes detected, no matter how small, should be reported to a health care provider. Women in their 20s and 30s should have a clinical breast exam (CBE) by a health care provider as part of a regular health exam every 1 to 3 years. After age 40, women should have a CBE every year. Starting at age 40, women should consider having a mammogram (breast X-ray test) every year. Women who have a family history of breast cancer should talk to their health care provider about genetic screening. Women at a high risk of breast cancer should talk to their health care providers about having an MRI and a mammogram every year.  Breast cancer gene (BRCA)-related cancer risk assessment is recommended for women who have family members with BRCA-related cancers. BRCA-related cancers include breast, ovarian, tubal, and peritoneal cancers. Having family members with these cancers may be associated with an increased risk for harmful changes (mutations) in the breast cancer genes BRCA1 and  BRCA2. Results of the assessment will determine the need for genetic counseling and BRCA1 and BRCA2 testing.  Your health care provider may recommend that you be screened regularly for cancer of the pelvic organs (ovaries, uterus, and vagina). This screening involves a pelvic examination, including checking for microscopic changes to the surface of your cervix (Pap test). You may be encouraged to have this screening done every 3 years, beginning at age 21.  For women ages 30-65, health care providers may recommend pelvic exams and Pap testing every 3 years, or they may recommend the Pap and pelvic exam, combined with testing for human papilloma virus (HPV), every 5 years. Some types of HPV increase your risk of cervical cancer. Testing for HPV may also be done on women of any age with unclear Pap test results.  Other health care providers may not recommend any screening for nonpregnant women who are considered low risk for pelvic cancer and who do not have symptoms. Ask your health care provider if a screening pelvic exam is right for   you.  If you have had past treatment for cervical cancer or a condition that could lead to cancer, you need Pap tests and screening for cancer for at least 20 years after your treatment. If Pap tests have been discontinued, your risk factors (such as having a new sexual partner) need to be reassessed to determine if screening should resume. Some women have medical problems that increase the chance of getting cervical cancer. In these cases, your health care provider may recommend more frequent screening and Pap tests.  Colorectal cancer can be detected and often prevented. Most routine colorectal cancer screening begins at the age of 50 years and continues through age 75 years. However, your health care provider may recommend screening at an earlier age if you have risk factors for colon cancer. On a yearly basis, your health care provider may provide home test kits to check  for hidden blood in the stool. Use of a small camera at the end of a tube, to directly examine the colon (sigmoidoscopy or colonoscopy), can detect the earliest forms of colorectal cancer. Talk to your health care provider about this at age 50, when routine screening begins. Direct exam of the colon should be repeated every 5-10 years through age 75 years, unless early forms of precancerous polyps or small growths are found.  People who are at an increased risk for hepatitis B should be screened for this virus. You are considered at high risk for hepatitis B if:  You were born in a country where hepatitis B occurs often. Talk with your health care provider about which countries are considered high risk.  Your parents were born in a high-risk country and you have not received a shot to protect against hepatitis B (hepatitis B vaccine).  You have HIV or AIDS.  You use needles to inject street drugs.  You live with, or have sex with, someone who has hepatitis B.  You get hemodialysis treatment.  You take certain medicines for conditions like cancer, organ transplantation, and autoimmune conditions.  Hepatitis C blood testing is recommended for all people born from 1945 through 1965 and any individual with known risks for hepatitis C.  Practice safe sex. Use condoms and avoid high-risk sexual practices to reduce the spread of sexually transmitted infections (STIs). STIs include gonorrhea, chlamydia, syphilis, trichomonas, herpes, HPV, and human immunodeficiency virus (HIV). Herpes, HIV, and HPV are viral illnesses that have no cure. They can result in disability, cancer, and death.  You should be screened for sexually transmitted illnesses (STIs) including gonorrhea and chlamydia if:  You are sexually active and are younger than 24 years.  You are older than 24 years and your health care provider tells you that you are at risk for this type of infection.  Your sexual activity has changed  since you were last screened and you are at an increased risk for chlamydia or gonorrhea. Ask your health care provider if you are at risk.  If you are at risk of being infected with HIV, it is recommended that you take a prescription medicine daily to prevent HIV infection. This is called preexposure prophylaxis (PrEP). You are considered at risk if:  You are sexually active and do not regularly use condoms or know the HIV status of your partner(s).  You take drugs by injection.  You are sexually active with a partner who has HIV.  Talk with your health care provider about whether you are at high risk of being infected with HIV. If   you choose to begin PrEP, you should first be tested for HIV. You should then be tested every 3 months for as long as you are taking PrEP.  Osteoporosis is a disease in which the bones lose minerals and strength with aging. This can result in serious bone fractures or breaks. The risk of osteoporosis can be identified using a bone density scan. Women ages 67 years and over and women at risk for fractures or osteoporosis should discuss screening with their health care providers. Ask your health care provider whether you should take a calcium supplement or vitamin D to reduce the rate of osteoporosis.  Menopause can be associated with physical symptoms and risks. Hormone replacement therapy is available to decrease symptoms and risks. You should talk to your health care provider about whether hormone replacement therapy is right for you.  Use sunscreen. Apply sunscreen liberally and repeatedly throughout the day. You should seek shade when your shadow is shorter than you. Protect yourself by wearing long sleeves, pants, a wide-brimmed hat, and sunglasses year round, whenever you are outdoors.  Once a month, do a whole body skin exam, using a mirror to look at the skin on your back. Tell your health care provider of new moles, moles that have irregular borders, moles that  are larger than a pencil eraser, or moles that have changed in shape or color.  Stay current with required vaccines (immunizations).  Influenza vaccine. All adults should be immunized every year.  Tetanus, diphtheria, and acellular pertussis (Td, Tdap) vaccine. Pregnant women should receive 1 dose of Tdap vaccine during each pregnancy. The dose should be obtained regardless of the length of time since the last dose. Immunization is preferred during the 27th-36th week of gestation. An adult who has not previously received Tdap or who does not know her vaccine status should receive 1 dose of Tdap. This initial dose should be followed by tetanus and diphtheria toxoids (Td) booster doses every 10 years. Adults with an unknown or incomplete history of completing a 3-dose immunization series with Td-containing vaccines should begin or complete a primary immunization series including a Tdap dose. Adults should receive a Td booster every 10 years.  Varicella vaccine. An adult without evidence of immunity to varicella should receive 2 doses or a second dose if she has previously received 1 dose. Pregnant females who do not have evidence of immunity should receive the first dose after pregnancy. This first dose should be obtained before leaving the health care facility. The second dose should be obtained 4-8 weeks after the first dose.  Human papillomavirus (HPV) vaccine. Females aged 13-26 years who have not received the vaccine previously should obtain the 3-dose series. The vaccine is not recommended for use in pregnant females. However, pregnancy testing is not needed before receiving a dose. If a female is found to be pregnant after receiving a dose, no treatment is needed. In that case, the remaining doses should be delayed until after the pregnancy. Immunization is recommended for any person with an immunocompromised condition through the age of 61 years if she did not get any or all doses earlier. During the  3-dose series, the second dose should be obtained 4-8 weeks after the first dose. The third dose should be obtained 24 weeks after the first dose and 16 weeks after the second dose.  Zoster vaccine. One dose is recommended for adults aged 30 years or older unless certain conditions are present.  Measles, mumps, and rubella (MMR) vaccine. Adults born  before 1957 generally are considered immune to measles and mumps. Adults born in 1957 or later should have 1 or more doses of MMR vaccine unless there is a contraindication to the vaccine or there is laboratory evidence of immunity to each of the three diseases. A routine second dose of MMR vaccine should be obtained at least 28 days after the first dose for students attending postsecondary schools, health care workers, or international travelers. People who received inactivated measles vaccine or an unknown type of measles vaccine during 1963-1967 should receive 2 doses of MMR vaccine. People who received inactivated mumps vaccine or an unknown type of mumps vaccine before 1979 and are at high risk for mumps infection should consider immunization with 2 doses of MMR vaccine. For females of childbearing age, rubella immunity should be determined. If there is no evidence of immunity, females who are not pregnant should be vaccinated. If there is no evidence of immunity, females who are pregnant should delay immunization until after pregnancy. Unvaccinated health care workers born before 1957 who lack laboratory evidence of measles, mumps, or rubella immunity or laboratory confirmation of disease should consider measles and mumps immunization with 2 doses of MMR vaccine or rubella immunization with 1 dose of MMR vaccine.  Pneumococcal 13-valent conjugate (PCV13) vaccine. When indicated, a person who is uncertain of his immunization history and has no record of immunization should receive the PCV13 vaccine. All adults 65 years of age and older should receive this  vaccine. An adult aged 19 years or older who has certain medical conditions and has not been previously immunized should receive 1 dose of PCV13 vaccine. This PCV13 should be followed with a dose of pneumococcal polysaccharide (PPSV23) vaccine. Adults who are at high risk for pneumococcal disease should obtain the PPSV23 vaccine at least 8 weeks after the dose of PCV13 vaccine. Adults older than 70 years of age who have normal immune system function should obtain the PPSV23 vaccine dose at least 1 year after the dose of PCV13 vaccine.  Pneumococcal polysaccharide (PPSV23) vaccine. When PCV13 is also indicated, PCV13 should be obtained first. All adults aged 65 years and older should be immunized. An adult younger than age 65 years who has certain medical conditions should be immunized. Any person who resides in a nursing home or long-term care facility should be immunized. An adult smoker should be immunized. People with an immunocompromised condition and certain other conditions should receive both PCV13 and PPSV23 vaccines. People with human immunodeficiency virus (HIV) infection should be immunized as soon as possible after diagnosis. Immunization during chemotherapy or radiation therapy should be avoided. Routine use of PPSV23 vaccine is not recommended for American Indians, [place] Natives, or people younger than 65 years unless there are medical conditions that require PPSV23 vaccine. When indicated, people who have unknown immunization and have no record of immunization should receive PPSV23 vaccine. One-time revaccination 5 years after the first dose of PPSV23 is recommended for people aged 19-64 years who have chronic kidney failure, nephrotic syndrome, asplenia, or immunocompromised conditions. People who received 1-2 doses of PPSV23 before age 65 years should receive another dose of PPSV23 vaccine at age 65 years or later if at least 5 years have passed since the previous dose. Doses of PPSV23 are not  needed for people immunized with PPSV23 at or after age 65 years.  Meningococcal vaccine. Adults with asplenia or persistent complement component deficiencies should receive 2 doses of quadrivalent meningococcal conjugate (MenACWY-D) vaccine. The doses should be obtained   at least 2 months apart. Microbiologists working with certain meningococcal bacteria, [place] recruits, people at risk during an outbreak, and people who travel to or live in countries with a high rate of meningitis should be immunized. A first-year college student up through age 34 years who is living in a residence hall should receive a dose if she did not receive a dose on or after her 16th birthday. Adults who have certain high-risk conditions should receive one or more doses of vaccine.  Hepatitis A vaccine. Adults who wish to be protected from this disease, have certain high-risk conditions, work with hepatitis A-infected animals, work in hepatitis A research labs, or travel to or work in countries with a high rate of hepatitis A should be immunized. Adults who were previously unvaccinated and who anticipate close contact with an international adoptee during the first 60 days after arrival in the [place] from a country with a high rate of hepatitis A should be immunized.  Hepatitis B vaccine. Adults who wish to be protected from this disease, have certain high-risk conditions, may be exposed to blood or other infectious body fluids, are household contacts or sex partners of hepatitis B positive people, are clients or workers in certain care facilities, or travel to or work in countries with a high rate of hepatitis B should be immunized.  Haemophilus influenzae type b (Hib) vaccine. A previously unvaccinated person with asplenia or sickle cell disease or having a scheduled splenectomy should receive 1 dose of Hib vaccine. Regardless of previous immunization, a recipient of a hematopoietic stem cell transplant should receive a  3-dose series 6-12 months after her successful transplant. Hib vaccine is not recommended for adults with HIV infection. Preventive Services / Frequency Ages 35 to 4 years  Blood pressure check.** / Every 3-5 years.  Lipid and cholesterol check.** / Every 5 years beginning at age 60.  Clinical breast exam.** / Every 3 years for women in their 71s and 10s.  BRCA-related cancer risk assessment.** / For women who have family members with a BRCA-related cancer (breast, ovarian, tubal, or peritoneal cancers).  Pap test.** / Every 2 years from ages 76 through 26. Every 3 years starting at age 61 through age 76 or 93 with a history of 3 consecutive normal Pap tests.  HPV screening.** / Every 3 years from ages 37 through ages 60 to 51 with a history of 3 consecutive normal Pap tests.  Hepatitis C blood test.** / For any individual with known risks for hepatitis C.  Skin self-exam. / Monthly.  Influenza vaccine. / Every year.  Tetanus, diphtheria, and acellular pertussis (Tdap, Td) vaccine.** / Consult your health care provider. Pregnant women should receive 1 dose of Tdap vaccine during each pregnancy. 1 dose of Td every 10 years.  Varicella vaccine.** / Consult your health care provider. Pregnant females who do not have evidence of immunity should receive the first dose after pregnancy.  HPV vaccine. / 3 doses over 6 months, if 93 and younger. The vaccine is not recommended for use in pregnant females. However, pregnancy testing is not needed before receiving a dose.  Measles, mumps, rubella (MMR) vaccine.** / You need at least 1 dose of MMR if you were born in 1957 or later. You may also need a 2nd dose. For females of childbearing age, rubella immunity should be determined. If there is no evidence of immunity, females who are not pregnant should be vaccinated. If there is no evidence of immunity, females who are  pregnant should delay immunization until after pregnancy.  Pneumococcal  13-valent conjugate (PCV13) vaccine.** / Consult your health care provider.  Pneumococcal polysaccharide (PPSV23) vaccine.** / 1 to 2 doses if you smoke cigarettes or if you have certain conditions.  Meningococcal vaccine.** / 1 dose if you are age 68 to 8 years and a Market researcher living in a residence hall, or have one of several medical conditions, you need to get vaccinated against meningococcal disease. You may also need additional booster doses.  Hepatitis A vaccine.** / Consult your health care provider.  Hepatitis B vaccine.** / Consult your health care provider.  Haemophilus influenzae type b (Hib) vaccine.** / Consult your health care provider. Ages 7 to 53 years  Blood pressure check.** / Every year.  Lipid and cholesterol check.** / Every 5 years beginning at age 25 years.  Lung cancer screening. / Every year if you are aged 11-80 years and have a 30-pack-year history of smoking and currently smoke or have quit within the past 15 years. Yearly screening is stopped once you have quit smoking for at least 15 years or develop a health problem that would prevent you from having lung cancer treatment.  Clinical breast exam.** / Every year after age 48 years.  BRCA-related cancer risk assessment.** / For women who have family members with a BRCA-related cancer (breast, ovarian, tubal, or peritoneal cancers).  Mammogram.** / Every year beginning at age 41 years and continuing for as long as you are in good health. Consult with your health care provider.  Pap test.** / Every 3 years starting at age 65 years through age 37 or 70 years with a history of 3 consecutive normal Pap tests.  HPV screening.** / Every 3 years from ages 72 years through ages 60 to 40 years with a history of 3 consecutive normal Pap tests.  Fecal occult blood test (FOBT) of stool. / Every year beginning at age 21 years and continuing until age 5 years. You may not need to do this test if you get  a colonoscopy every 10 years.  Flexible sigmoidoscopy or colonoscopy.** / Every 5 years for a flexible sigmoidoscopy or every 10 years for a colonoscopy beginning at age 35 years and continuing until age 48 years.  Hepatitis C blood test.** / For all people born from [birth year] through 1965 and any individual with known risks for hepatitis C.  Skin self-exam. / Monthly.  Influenza vaccine. / Every year.  Tetanus, diphtheria, and acellular pertussis (Tdap/Td) vaccine.** / Consult your health care provider. Pregnant women should receive 1 dose of Tdap vaccine during each pregnancy. 1 dose of Td every 10 years.  Varicella vaccine.** / Consult your health care provider. Pregnant females who do not have evidence of immunity should receive the first dose after pregnancy.  Zoster vaccine.** / 1 dose for adults aged 30 years or older.  Measles, mumps, rubella (MMR) vaccine.** / You need at least 1 dose of MMR if you were born in 1957 or later. You may also need a second dose. For females of childbearing age, rubella immunity should be determined. If there is no evidence of immunity, females who are not pregnant should be vaccinated. If there is no evidence of immunity, females who are pregnant should delay immunization until after pregnancy.  Pneumococcal 13-valent conjugate (PCV13) vaccine.** / Consult your health care provider.  Pneumococcal polysaccharide (PPSV23) vaccine.** / 1 to 2 doses if you smoke cigarettes or if you have certain conditions.  Meningococcal vaccine.** /  Consult your health care provider.  Hepatitis A vaccine.** / Consult your health care provider.  Hepatitis B vaccine.** / Consult your health care provider.  Haemophilus influenzae type b (Hib) vaccine.** / Consult your health care provider. Ages 64 years and over  Blood pressure check.** / Every year.  Lipid and cholesterol check.** / Every 5 years beginning at age 23 years.  Lung cancer screening. / Every year if you  are aged 16-80 years and have a 30-pack-year history of smoking and currently smoke or have quit within the past 15 years. Yearly screening is stopped once you have quit smoking for at least 15 years or develop a health problem that would prevent you from having lung cancer treatment.  Clinical breast exam.** / Every year after age 74 years.  BRCA-related cancer risk assessment.** / For women who have family members with a BRCA-related cancer (breast, ovarian, tubal, or peritoneal cancers).  Mammogram.** / Every year beginning at age 44 years and continuing for as long as you are in good health. Consult with your health care provider.  Pap test.** / Every 3 years starting at age 58 years through age 22 or 39 years with 3 consecutive normal Pap tests. Testing can be stopped between 65 and 70 years with 3 consecutive normal Pap tests and no abnormal Pap or HPV tests in the past 10 years.  HPV screening.** / Every 3 years from ages 64 years through ages 70 or 61 years with a history of 3 consecutive normal Pap tests. Testing can be stopped between 65 and 70 years with 3 consecutive normal Pap tests and no abnormal Pap or HPV tests in the past 10 years.  Fecal occult blood test (FOBT) of stool. / Every year beginning at age 40 years and continuing until age 27 years. You may not need to do this test if you get a colonoscopy every 10 years.  Flexible sigmoidoscopy or colonoscopy.** / Every 5 years for a flexible sigmoidoscopy or every 10 years for a colonoscopy beginning at age 7 years and continuing until age 32 years.  Hepatitis C blood test.** / For all people born from [birth year] through 1966 and any individual with known risks for hepatitis C.  Osteoporosis screening.** / A one-time screening for women ages 30 years and over and women at risk for fractures or osteoporosis.  Skin self-exam. / Monthly.  Influenza vaccine. / Every year.  Tetanus, diphtheria, and acellular pertussis (Tdap/Td)  vaccine.** / 1 dose of Td every 10 years.  Varicella vaccine.** / Consult your health care provider.  Zoster vaccine.** / 1 dose for adults aged 35 years or older.  Pneumococcal 13-valent conjugate (PCV13) vaccine.** / Consult your health care provider.  Pneumococcal polysaccharide (PPSV23) vaccine.** / 1 dose for all adults aged 46 years and older.  Meningococcal vaccine.** / Consult your health care provider.  Hepatitis A vaccine.** / Consult your health care provider.  Hepatitis B vaccine.** / Consult your health care provider.  Haemophilus influenzae type b (Hib) vaccine.** / Consult your health care provider. ** Family history and personal history of risk and conditions may change your health care provider's recommendations.   This information is not intended to replace advice given to you by your health care provider. Make sure you discuss any questions you have with your health care provider.   Document Released: 10/23/2001 Document Revised: 09/17/2014 Document Reviewed: 01/22/2011 Elsevier Interactive Patient Education [employer].

## 2015-08-22 NOTE — Progress Notes (Signed)
Pre visit review using our clinic review tool, if applicable. No additional management support is needed unless otherwise documented below in the visit note. 

## 2015-08-22 NOTE — Progress Notes (Signed)
Subjective:   Kayla [name] is a 69 y.o. female who presents for Medicare Annual (Subsequent) preventive examination.  Review of Systems:   Review of Systems  Constitutional: Negative for activity change, appetite change and fatigue.  HENT: Negative for hearing loss, congestion, tinnitus and ear discharge.   Eyes: Negative for visual disturbance (see optho q1y -- vision corrected to 20/20 with glasses).  Respiratory: Negative for cough, chest tightness and shortness of breath.   Cardiovascular: Negative for chest pain, palpitations and leg swelling.  Gastrointestinal: Negative for abdominal pain, diarrhea, constipation and abdominal distention.  Genitourinary: Negative for urgency, frequency, decreased urine volume and difficulty urinating.  Musculoskeletal: Negative for back pain, arthralgias and gait problem.  Skin: Negative for color change, pallor and rash.  Neurological: Negative for dizziness, light-headedness, numbness and headaches.  Hematological: Negative for adenopathy. Does not bruise/bleed easily.  Psychiatric/Behavioral: Negative for suicidal ideas, confusion, sleep disturbance, self-injury, dysphoric mood, decreased concentration and agitation.  Pt is able to read and write and can do all ADLs No risk for falling No abuse/ violence in home          Objective:     Vitals: BP 132/80 mmHg  Pulse 56  Temp(Src) 98.3 F (36.8 C) (Oral)  Ht _0  (1.575 m)  Wt 160 lb (72.576 kg)  BMI 29.26 kg/m2  SpO2 98% BP 132/80 mmHg  Pulse 56  Temp(Src) 98.3 F (36.8 C) (Oral)  Ht _1  (1.575 m)  Wt 160 lb (72.576 kg)  BMI 29.26 kg/m2  SpO2 98% General appearance: alert, cooperative, appears stated age and no distress Head: Normocephalic, without obvious abnormality, atraumatic Eyes: conjunctivae/corneas clear. PERRL, EOM's intact. Fundi benign. Ears: normal TM's and external ear canals both ears Nose: Nares normal. Septum midline. Mucosa normal. No drainage or  sinus tenderness. Throat: lips, mucosa, and tongue normal; teeth and gums normal Neck: no adenopathy, no carotid bruit, no JVD, supple, symmetrical, trachea midline and thyroid not enlarged, symmetric, no tenderness/mass/nodules Back: symmetric, no curvature. ROM normal. No CVA tenderness. Lungs: clear to auscultation bilaterally Breasts: normal appearance, no masses or tenderness Heart: regular rate and rhythm, S1, S2 normal, no murmur, click, rub or gallop Abdomen: soft, non-tender; bowel sounds normal; no masses,  no organomegaly Pelvic: not indicated; post-menopausal, no abnormal Pap smears in past Extremities: extremities normal, atraumatic, no cyanosis or edema Pulses: 2+ and symmetric Skin: Skin color, texture, turgor normal. No rashes or lesions Lymph nodes: Cervical, supraclavicular, and axillary nodes normal. Neurologic: Alert and oriented X 3, normal strength and tone. Normal symmetric reflexes. Normal coordination and gait Psych- no depression, no anxiety  Tobacco History  Smoking status  . Former Smoker -- 0.50 packs/day for 10 years  . Types: Cigarettes  . Quit date: 09/10/1974  Smokeless tobacco  . Never Used    Comment: smoked 1966- 1976, up to 1 ppd     Counseling given: Not Answered   Past Medical History  Diagnosis Date  . Asthma   . GERD (gastroesophageal reflux disease)     gastric polyp x3  . Osteopenia     last 07/2011  . Fibromyalgia   . Glaucoma      Dr Bing Plume  . IBS (irritable bowel syndrome)   . Diverticulosis   . Pneumonia      OP as child  . Diverticulitis   . Fasting hyperglycemia 04/2011    FBS 108  . Fundic gland polyps of stomach, benign   . CAP (community acquired pneumonia)  08/2012    Dr Birdie Riddle  . Shingles    Past Surgical History  Procedure Laterality Date  . Spine surgery      T9-L5 fusions  . Colonoscopy w/ polypectomy  [phone]    last  colonoscopy 2007, Dr Carlean Purl  . Esophagogastroduodenoscopy     Family History    Problem Relation Age of Onset  . Breast cancer Mother   . Breast cancer Sister   . Breast cancer Maternal Aunt     two  . Breast cancer Paternal Aunt   . Asthma Paternal Aunt   . Heart failure Father     CHF  . Kidney disease Maternal Grandmother   . Ovarian cancer      Niece x2  . Coronary artery disease Paternal Aunt     triple CABG  . Heart attack Paternal Grandmother     MI in late 56s  . Heart disease Paternal Grandfather   . Heart attack Paternal Uncle      MI in 38s  . Heart disease Maternal Aunt   . Diabetes Neg Hx   . Stroke Neg Hx   . COPD Neg Hx   . Breast cancer      niece  . Pulmonary embolism Son   . Deep vein thrombosis Son    History  Sexual Activity  . Sexual Activity: Yes    Outpatient Encounter Prescriptions as of 08/22/2015  Medication Sig  . acetaminophen (TYLENOL) 500 MG tablet Take 500 mg by mouth as needed.  Marland Kitchen albuterol (PROVENTIL HFA;VENTOLIN HFA) 108 (90 BASE) MCG/ACT inhaler Inhale 2 puffs into the lungs every 6 (six) hours as needed for wheezing or shortness of breath. As needed only  . albuterol (PROVENTIL) (2.5 MG/3ML) 0.083% nebulizer solution Take 2.5 mg by nebulization every 6 (six) hours as needed.  . bifidobacterium infantis (ALIGN) capsule Take 1 capsule by mouth daily.  Marland Kitchen BLACK ELDERBERRY,BERRY-FLOWER, PO Take by mouth.  . cetirizine (ZYRTEC) 10 MG tablet Take 10 mg by mouth as needed.   . Cholecalciferol (VITAMIN D3) 2000 UNITS TABS Take by mouth daily.  Marland Kitchen EPINEPHrine (EPI-PEN) 0.3 mg/0.3 mL DEVI Inject 0.3 mLs (0.3 mg total) into the muscle once.  Marland Kitchen esomeprazole (NEXIUM) 40 MG capsule Take 40 mg by mouth as needed. She uses prn to alternate between this and the omeprazole  . Flaxseed, Linseed, (GNP FLAX SEED OIL PO) Take 1 mL by mouth daily.  . fluticasone (FLONASE) 50 MCG/ACT nasal spray INSTILL 2 SPRAYS IN EACH NOSTRIL ONCE DAILY  . GuaiFENesin (MUCINEX PO) Take by mouth as needed.   . hyoscyamine (LEVSIN SL) 0.125 MG SL tablet  Place 0.125 mg under the tongue every 6 (six) hours as needed. 1 under tongue every 6 hrs prn for abdominal pain   . metoprolol tartrate (LOPRESSOR) 25 MG tablet TAKE ONE TABLET BY MOUTH TWICE DAILY **TAKE  ONE  EXTRA  TABLET  AS  NEEDED**  . montelukast (SINGULAIR) 10 MG tablet TAKE 1 TABLET EVERY DAY  . Multiple Minerals-Vitamins (CALCIUM CITRATE PLUS/MAGNESIUM PO) Take by mouth.  . nystatin cream (MYCOSTATIN) Apply 1 application topically 2 (two) times daily.  Marland Kitchen nystatin-triamcinolone (MYCOLOG II) cream Apply 1 application topically as needed.  . Omega-3 Fatty Acids (FISH OIL PO) Take by mouth.  Marland Kitchen omeprazole (PRILOSEC) 40 MG capsule Take 1 capsule (40 mg total) by mouth daily.  . ranitidine (ZANTAC) 150 MG tablet Take 150 mg by mouth at bedtime.   . sodium chloride (OCEAN) 0.65 % SOLN  nasal spray Place 1 spray into both nostrils as needed for congestion.  Marland Kitchen Specialty Vitamins Products (RETAINE VISION PO) Take by mouth.  . traMADol (ULTRAM) 50 MG tablet Take 1 tablet (50 mg total) by mouth every 6 (six) hours as needed.  . travoprost, benzalkonium, (TRAVATAN) 0.004 % ophthalmic solution Place 1 drop into both eyes at bedtime.  . triamcinolone cream (KENALOG) 0.1 % Apply 1 application topically 2 (two) times daily.  . Vitamin Mixture (ESTER-C PO) Take 1,000 mg by mouth daily.  . [DISCONTINUED] albuterol (PROVENTIL HFA;VENTOLIN HFA) 108 (90 BASE) MCG/ACT inhaler Inhale 2 puffs into the lungs. As needed only  . [DISCONTINUED] fluticasone (FLONASE) 50 MCG/ACT nasal spray INSTILL 2 SPRAYS IN EACH NOSTRIL ONCE DAILY  . [DISCONTINUED] EPINEPHrine (EPIPEN 2-PAK) 0.3 mg/0.3 mL IJ SOAJ injection As directed   No facility-administered encounter medications on file as of 08/22/2015.    Activities of Daily Living In your present state of health, do you have any difficulty performing the following activities: 08/22/2015 02/17/2015  Hearing? N N  Vision? N N  Difficulty concentrating or making decisions?  N N  Walking or climbing stairs? N N  Dressing or bathing? N N  Doing errands, shopping? N N    Patient Care Team: Rosalita Chessman, DO as PCP - General (Family Medicine) Hendricks Limes, MD as Referring Physician (Internal Medicine) Gatha Mayer, MD as Consulting Physician (Gastroenterology) Calvert Cantor, MD as Consulting Physician (Ophthalmology) Domingo Mend, DMD (Dentistry) Minus Breeding, MD as Consulting Physician (Cardiology) Izora Gala, MD as Consulting Physician (Otolaryngology)    Assessment:    Cpe:  Exercise Activities and Dietary recommendations---  Walking, elliptical    Goals    None     Fall Risk Fall Risk  08/22/2015 02/17/2015 01/11/2014 12/18/2012  Falls in the past year? Yes No No No  Number falls in past yr: 1 - - -  Injury with Fall? No - - -  Follow up Falls evaluation completed - - -   Depression Screen PHQ 2/9 Scores 08/22/2015 02/17/2015 01/11/2014 12/18/2012  PHQ - 2 Score 0 0 0 0     Cognitive Testing No flowsheet data found.  Immunization History  Administered Date(s) Administered  . Influenza Whole 06/14/2008, 06/09/2010, 07/06/2011  . Influenza, High Dose Seasonal PF 06/10/2014, 05/26/2015  . Influenza, Seasonal, Injecte, Preservative Fre 05/26/2013  . Influenza-Unspecified 05/30/2012, 05/22/2013  . Pneumococcal Conjugate-13 01/11/2014  . Pneumococcal Polysaccharide-23 07/12/2011  . Td 03/07/2006  . Tdap 02/17/2015  . Zoster 08/19/2013   Screening Tests Health Maintenance  Topic Date Due  . COLONOSCOPY  02/08/2016  . INFLUENZA VACCINE  04/10/2016  . MAMMOGRAM  08/20/2016  . TETANUS/TDAP  02/16/2025  . DEXA SCAN  Completed  . ZOSTAVAX  Completed  . Hepatitis C Screening  Completed  . PNA vac Low Risk Adult  Completed      Plan:    see avs During the course of the visit the patient was educated and counseled about the following appropriate screening and preventive services:   Vaccines to include Pneumoccal, Influenza,  Hepatitis B, Td, Zostavax, HCV  Electrocardiogram  Cardiovascular Disease  Colorectal cancer screening  Bone density screening  Diabetes screening  Glaucoma screening  Mammography/PAP  Nutrition counseling   Patient Instructions (the written plan) was given to the patient.  1. Seasonal allergies   - fluticasone (FLONASE) 50 MCG/ACT nasal spray; INSTILL 2 SPRAYS IN EACH NOSTRIL ONCE DAILY  Dispense: 48 g; Refill: 3  2.  Essential hypertension stable - Comp Met (CMET) - CBC with Differential/Platelet - Lipid panel - POCT urinalysis dipstick  3. Medicare annual wellness visit, subsequent    4. Routine history and physical examination of adult    Garnet Koyanagi, DO  08/22/2015

## 2015-08-23 LAB — COMPREHENSIVE METABOLIC PANEL
ALBUMIN: 4.5 g/dL (ref 3.5–5.2)
ALT: 20 U/L (ref 0–35)
AST: 27 U/L (ref 0–37)
Alkaline Phosphatase: 100 U/L (ref 39–117)
BUN: 16 mg/dL (ref 6–23)
CALCIUM: 9.9 mg/dL (ref 8.4–10.5)
CHLORIDE: 102 meq/L (ref 96–112)
CO2: 31 meq/L (ref 19–32)
Creatinine, Ser: 0.73 mg/dL (ref 0.40–1.20)
GFR: 83.78 mL/min (ref >60.00)
Glucose, Bld: 88 mg/dL (ref 70–99)
POTASSIUM: 4.9 meq/L (ref 3.5–5.1)
SODIUM: 139 meq/L (ref 135–145)
Total Bilirubin: 0.4 mg/dL (ref 0.2–1.2)
Total Protein: 7.3 g/dL (ref 6.0–8.3)

## 2015-08-23 LAB — CBC WITH DIFFERENTIAL/PLATELET
BASOS PCT: 0.7 % (ref 0.0–3.0)
Basophils Absolute: 0 10*3/uL (ref 0.0–0.1)
EOS PCT: 2 % (ref 0.0–5.0)
Eosinophils Absolute: 0.1 10*3/uL (ref 0.0–0.7)
HEMATOCRIT: 41.7 % (ref 36.0–46.0)
HEMOGLOBIN: 13.9 g/dL (ref 12.0–15.0)
LYMPHS PCT: 24.8 % (ref 12.0–46.0)
Lymphs Abs: 1.3 10*3/uL (ref 0.7–4.0)
MCHC: 33.2 g/dL (ref 30.0–36.0)
MCV: 89.7 fl (ref 78.0–100.0)
Monocytes Absolute: 0.4 10*3/uL (ref 0.1–1.0)
Monocytes Relative: 8.5 % (ref 3.0–12.0)
NEUTROS ABS: 3.2 10*3/uL (ref 1.4–7.7)
NEUTROS PCT: 64 % (ref 43.0–77.0)
Platelets: 163 10*3/uL (ref 150.0–400.0)
RBC: 4.65 Mil/uL (ref 3.87–5.11)
RDW: 14 % (ref 11.5–15.5)
WBC: 5.1 10*3/uL (ref 4.0–10.5)

## 2015-08-23 LAB — LIPID PANEL
Cholesterol: 216 mg/dL — ABNORMAL HIGH (ref 0–200)
HDL: 73.9 mg/dL (ref >39.00)
LDL CALC: 128 mg/dL — AB (ref 0–99)
NONHDL: 142.11
Total CHOL/HDL Ratio: 3
Triglycerides: 70 mg/dL (ref 0.0–149.0)
VLDL: 14 mg/dL (ref 0.0–40.0)

## 2015-08-29 ENCOUNTER — Encounter: Payer: Self-pay | Admitting: Family Medicine

## 2015-09-01 ENCOUNTER — Other Ambulatory Visit: Payer: Self-pay

## 2015-09-01 ENCOUNTER — Telehealth: Payer: Self-pay | Admitting: Family Medicine

## 2015-09-01 MED ORDER — OMEPRAZOLE 40 MG PO CPDR: 40.0000 mg | DELAYED_RELEASE_CAPSULE | Freq: Every day | ORAL | Status: DC

## 2015-09-01 NOTE — Telephone Encounter (Signed)
Spoke with patient and she wanted the Prolia, she discussed with Dr.Lowne but the information was not relayed to me. I will have Gilmore Laroche order the Prolia tomorrow and I scheduled the patient for next Thursday.      KP

## 2015-09-01 NOTE — Telephone Encounter (Signed)
Pt calling to see if the prolia came in. She thought it was ordered 08/22/15 when she had her CPE. She is really wanting it before the end of the year because her insurance is changing and she it will cost her more. Please call pt to let her know if we can get it done before the end of December. Ph# [phone].

## 2015-09-02 ENCOUNTER — Other Ambulatory Visit: Payer: Self-pay

## 2015-09-02 MED ORDER — OMEPRAZOLE 40 MG PO CPDR: 40.0000 mg | DELAYED_RELEASE_CAPSULE | Freq: Every day | ORAL | Status: DC

## 2015-09-08 ENCOUNTER — Ambulatory Visit (INDEPENDENT_AMBULATORY_CARE_PROVIDER_SITE_OTHER): Payer: Commercial Managed Care - HMO

## 2015-09-08 DIAGNOSIS — M810 Age-related osteoporosis without current pathological fracture: Secondary | ICD-10-CM

## 2015-09-08 MED ORDER — DENOSUMAB 60 MG/ML ~~LOC~~ SOLN: 60.0000 mg | Freq: Once | SUBCUTANEOUS | Status: DC

## 2015-09-15 ENCOUNTER — Other Ambulatory Visit: Payer: Self-pay

## 2015-09-15 MED ORDER — OMEPRAZOLE 40 MG PO CPDR: 40.0000 mg | DELAYED_RELEASE_CAPSULE | Freq: Every day | ORAL | Status: DC

## 2015-11-17 ENCOUNTER — Telehealth: Payer: Self-pay | Admitting: Family Medicine

## 2015-11-17 DIAGNOSIS — E785 Hyperlipidemia, unspecified: Secondary | ICD-10-CM

## 2015-11-17 NOTE — Telephone Encounter (Signed)
Orders in..     KP 

## 2015-11-17 NOTE — Telephone Encounter (Signed)
Pt called for repeat labs noted from Dec 2016 visit. Lab appt scheduled for 12/01/15. Please enter lab orders as needed.

## 2015-11-18 DIAGNOSIS — H401131 Primary open-angle glaucoma, bilateral, mild stage: Secondary | ICD-10-CM | POA: Diagnosis not present

## 2015-11-18 DIAGNOSIS — H2513 Age-related nuclear cataract, bilateral: Secondary | ICD-10-CM | POA: Diagnosis not present

## 2015-11-18 DIAGNOSIS — H04123 Dry eye syndrome of bilateral lacrimal glands: Secondary | ICD-10-CM | POA: Diagnosis not present

## 2015-12-01 ENCOUNTER — Other Ambulatory Visit (INDEPENDENT_AMBULATORY_CARE_PROVIDER_SITE_OTHER): Payer: Medicare Other

## 2015-12-01 DIAGNOSIS — E785 Hyperlipidemia, unspecified: Secondary | ICD-10-CM | POA: Diagnosis not present

## 2015-12-01 LAB — COMPREHENSIVE METABOLIC PANEL
ALBUMIN: 4.3 g/dL (ref 3.5–5.2)
ALT: 17 U/L (ref 0–35)
AST: 22 U/L (ref 0–37)
Alkaline Phosphatase: 66 U/L (ref 39–117)
BUN: 17 mg/dL (ref 6–23)
CALCIUM: 9.5 mg/dL (ref 8.4–10.5)
CHLORIDE: 101 meq/L (ref 96–112)
CO2: 30 mEq/L (ref 19–32)
Creatinine, Ser: 0.69 mg/dL (ref 0.40–1.20)
GFR: 89.34 mL/min (ref >60.00)
Glucose, Bld: 85 mg/dL (ref 70–99)
POTASSIUM: 4.2 meq/L (ref 3.5–5.1)
Sodium: 137 mEq/L (ref 135–145)
Total Bilirubin: 0.6 mg/dL (ref 0.2–1.2)
Total Protein: 7.2 g/dL (ref 6.0–8.3)

## 2015-12-01 LAB — LIPID PANEL
CHOLESTEROL: 219 mg/dL — AB (ref 0–200)
HDL: 63.4 mg/dL (ref >39.00)
LDL CALC: 137 mg/dL — AB (ref 0–99)
NonHDL: 155.43
TRIGLYCERIDES: 90 mg/dL (ref 0.0–149.0)
Total CHOL/HDL Ratio: 3
VLDL: 18 mg/dL (ref 0.0–40.0)

## 2015-12-06 ENCOUNTER — Other Ambulatory Visit: Payer: Self-pay

## 2015-12-06 MED ORDER — MONTELUKAST SODIUM 10 MG PO TABS: 10.0000 mg | ORAL_TABLET | Freq: Every day | ORAL | Status: DC

## 2015-12-06 MED ORDER — METOPROLOL TARTRATE 25 MG PO TABS: ORAL_TABLET | ORAL | Status: DC

## 2015-12-07 ENCOUNTER — Other Ambulatory Visit: Payer: Self-pay

## 2015-12-07 MED ORDER — OMEPRAZOLE 40 MG PO CPDR: 40.0000 mg | DELAYED_RELEASE_CAPSULE | Freq: Every day | ORAL | Status: DC

## 2015-12-13 ENCOUNTER — Encounter: Payer: Self-pay | Admitting: Family Medicine

## 2015-12-29 ENCOUNTER — Telehealth: Payer: Self-pay | Admitting: Family Medicine

## 2015-12-29 NOTE — Telephone Encounter (Signed)
Relation to [id number] Call back number:[phone] Pharmacy:  Reason for call:  Home Health nurse visited patient today for a routine check up and found patient was experiencing upper respiratory congestion and asked how often does patient use her inhaler. Patient responded when needed. Patient wanted to inform PCP. Please advise

## 2015-12-29 NOTE — Telephone Encounter (Signed)
How often is when needed?  If more than 2x a day she probably needs to be seen

## 2015-12-29 NOTE — Telephone Encounter (Signed)
FYI

## 2015-12-30 ENCOUNTER — Encounter: Payer: Self-pay | Admitting: Physician Assistant

## 2015-12-30 ENCOUNTER — Ambulatory Visit (INDEPENDENT_AMBULATORY_CARE_PROVIDER_SITE_OTHER): Payer: Medicare Other | Admitting: Physician Assistant

## 2015-12-30 VITALS — BP 134/80 | HR 61 | Temp 98.7°F | Ht 62.0 in | Wt 159.2 lb

## 2015-12-30 DIAGNOSIS — J069 Acute upper respiratory infection, unspecified: Secondary | ICD-10-CM

## 2015-12-30 NOTE — Progress Notes (Signed)
Patient presents to clinic today c/o 1 day of dry cough, chest congestion and intermittent chest tightness. Endorses feeling better today compared to yesterday. Denies chest pain, SOB, fever or chills. Has noted mild wheeze. Has not increased use of albuterol. Denies recent travel or sick contact.  Past Medical History  Diagnosis Date  . Asthma   . GERD (gastroesophageal reflux disease)     gastric polyp x3  . Osteopenia     last 07/2011  . Fibromyalgia   . Glaucoma      Dr Bing Plume  . IBS (irritable bowel syndrome)   . Diverticulosis   . Pneumonia      OP as child  . Diverticulitis   . Fasting hyperglycemia 04/2011    FBS 108  . Fundic gland polyps of stomach, benign   . CAP (community acquired pneumonia) 08/2012    Dr Birdie Riddle  . Shingles     Current Outpatient Prescriptions on File Prior to Visit  Medication Sig Dispense Refill  . acetaminophen (TYLENOL) 500 MG tablet Take 500 mg by mouth as needed.    Marland Kitchen albuterol (PROVENTIL HFA;VENTOLIN HFA) 108 (90 BASE) MCG/ACT inhaler Inhale 2 puffs into the lungs every 6 (six) hours as needed for wheezing or shortness of breath. As needed only 1 Inhaler 1  . bifidobacterium infantis (ALIGN) capsule Take 1 capsule by mouth daily. 21 capsule 0  . BLACK ELDERBERRY,BERRY-FLOWER, PO Take by mouth.    . cetirizine (ZYRTEC) 10 MG tablet Take 10 mg by mouth as needed.     . Cholecalciferol (VITAMIN D3) 2000 UNITS TABS Take by mouth daily.    Marland Kitchen EPINEPHrine (EPI-PEN) 0.3 mg/0.3 mL DEVI Inject 0.3 mLs (0.3 mg total) into the muscle once. 1 Device 0  . esomeprazole (NEXIUM) 40 MG capsule Take 40 mg by mouth as needed. She uses prn to alternate between this and the omeprazole    . Flaxseed, Linseed, (GNP FLAX SEED OIL PO) Take 1 mL by mouth daily.    . fluticasone (FLONASE) 50 MCG/ACT nasal spray INSTILL 2 SPRAYS IN EACH NOSTRIL ONCE DAILY 48 g 3  . GuaiFENesin (MUCINEX PO) Take by mouth as needed.     . hyoscyamine (LEVSIN SL) 0.125 MG SL tablet  Place 0.125 mg under the tongue every 6 (six) hours as needed. 1 under tongue every 6 hrs prn for abdominal pain     . metoprolol tartrate (LOPRESSOR) 25 MG tablet TAKE ONE TABLET BY MOUTH TWICE DAILY **TAKE  ONE  EXTRA  TABLET  AS  NEEDED** 270 tablet 1  . montelukast (SINGULAIR) 10 MG tablet Take 1 tablet (10 mg total) by mouth daily. 90 tablet 1  . Multiple Minerals-Vitamins (CALCIUM CITRATE PLUS/MAGNESIUM PO) Take by mouth.    . nystatin cream (MYCOSTATIN) Apply 1 application topically 2 (two) times daily. 90 g 3  . nystatin-triamcinolone (MYCOLOG II) cream Apply 1 application topically as needed.    . Omega-3 Fatty Acids (FISH OIL PO) Take by mouth.    Marland Kitchen omeprazole (PRILOSEC) 40 MG capsule Take 1 capsule (40 mg total) by mouth daily. 90 capsule 0  . ranitidine (ZANTAC) 150 MG tablet Take 150 mg by mouth at bedtime.     . sodium chloride (OCEAN) 0.65 % SOLN nasal spray Place 1 spray into both nostrils as needed for congestion.    Marland Kitchen Specialty Vitamins Products (RETAINE VISION PO) Take by mouth.    . traMADol (ULTRAM) 50 MG tablet Take 1 tablet (50 mg total) by  mouth every 6 (six) hours as needed. 40 tablet 0  . travoprost, benzalkonium, (TRAVATAN) 0.004 % ophthalmic solution Place 1 drop into both eyes at bedtime.    . triamcinolone cream (KENALOG) 0.1 % Apply 1 application topically 2 (two) times daily. 90 g 3  . Vitamin Mixture (ESTER-C PO) Take 1,000 mg by mouth daily.    Marland Kitchen albuterol (PROVENTIL) (2.5 MG/3ML) 0.083% nebulizer solution Take 2.5 mg by nebulization every 6 (six) hours as needed. Reported on 12/30/2015     Current Facility-Administered Medications on File Prior to Visit  Medication Dose Route Frequency Provider Last Rate Last Dose  . denosumab (PROLIA) injection 60 mg  60 mg Subcutaneous Once Rosalita Chessman Chase, DO        Allergies  Allergen Reactions  . Cephalosporins     Blisters  orally  . Gabapentin     REACTION: rash  . Levofloxacin     REACTION: stomach ache and  rash  . Psyllium     REACTION: rash  . Sulfonamide Derivatives     REACTION: shock, urticaria  . Aspirin     REACTION: stomach pain  . Belladonna   . Ciprofloxacin Hcl   . Conjugated Estrogens   . Nabumetone   . Nsaids   . Soybean-Containing Drug Products Other (See Comments)    GI upset  . Zicam Cold Remedy [Erysidoron #1]   . Prednisone     NO PROBLEM WITH MEDROL DOSE PAK    Family History  Problem Relation Age of Onset  . Breast cancer Mother   . Breast cancer Sister   . Breast cancer Maternal Aunt     two  . Breast cancer Paternal Aunt   . Asthma Paternal Aunt   . Heart failure Father     CHF  . Kidney disease Maternal Grandmother   . Ovarian cancer      Niece x2  . Coronary artery disease Paternal Aunt     triple CABG  . Heart attack Paternal Grandmother     MI in late 97s  . Heart disease Paternal Grandfather   . Heart attack Paternal Uncle      MI in 55s  . Heart disease Maternal Aunt   . Diabetes Neg Hx   . Stroke Neg Hx   . COPD Neg Hx   . Breast cancer      niece  . Pulmonary embolism Son   . Deep vein thrombosis Son     Social History   Social History  . Marital Status: Married    Spouse Name: N/A  . Number of Children: N/A  . Years of Education: N/A   Social History Main Topics  . Smoking status: Former Smoker -- 0.50 packs/day for 10 years    Types: Cigarettes    Quit date: 09/10/1974  . Smokeless tobacco: Never Used     Comment: smoked 1966- 1976, up to 1 ppd  . Alcohol Use: No  . Drug Use: No  . Sexual Activity: Yes   Other Topics Concern  . None   Social History Narrative   Daily caffeine 3 cups   Regular exercise   Married          Review of Systems - See HPI.  All other ROS are negative.  Pulse 61  Temp(Src) 98.7 F (37.1 C) (Oral)  Ht 5' 2" (1.575 m)  Wt 159 lb 3.2 oz (72.213 kg)  BMI 29.11 kg/m2  SpO2 98%  Physical Exam  Constitutional:  She is oriented to person, place, and time and well-developed,  well-nourished, and in no distress.  HENT:  Head: Normocephalic and atraumatic.  Right Ear: External ear normal.  Left Ear: External ear normal.  Nose: Nose normal.  Mouth/Throat: Oropharynx is clear and moist. No oropharyngeal exudate.  TM within normal limits bilaterally  Eyes: Conjunctivae are normal.  Neck: Neck supple.  Cardiovascular: Normal rate, regular rhythm, normal heart sounds and intact distal pulses.   Pulmonary/Chest: Effort normal and breath sounds normal. No respiratory distress. She has no wheezes. She has no rales. She exhibits no tenderness.  Neurological: She is alert and oriented to person, place, and time.  Skin: Skin is warm and dry. No rash noted.  Psychiatric: Affect normal.  Vitals reviewed.   Recent Results (from the past 2160 hour(s))  Lipid panel     Status: Abnormal   Collection Time: 12/01/15  9:02 AM  Result Value Ref Range   Cholesterol 219 (H) 0 - 200 mg/dL    Comment: ATP III Classification       Desirable:  < 200 mg/dL               Borderline High:  200 - 239 mg/dL          High:  > = 240 mg/dL   Triglycerides 90.0 0.0 - 149.0 mg/dL    Comment: Normal:  <150 mg/dLBorderline High:  150 - 199 mg/dL   HDL 63.40 >39.00 mg/dL   VLDL 18.0 0.0 - 40.0 mg/dL   LDL Cholesterol 137 (H) 0 - 99 mg/dL   Total CHOL/HDL Ratio 3     Comment:                Men          Women1/2 Average Risk     3.4          3.3Average Risk          5.0          4.42X Average Risk          9.6          7.13X Average Risk          15.0          11.0                       NonHDL 155.43     Comment: NOTE:  Non-HDL goal should be 30 mg/dL higher than patient's LDL goal (i.e. LDL goal of < 70 mg/dL, would have non-HDL goal of < 100 mg/dL)  Comp Met (CMET)     Status: None   Collection Time: 12/01/15  9:02 AM  Result Value Ref Range   Sodium 137 135 - 145 mEq/L   Potassium 4.2 3.5 - 5.1 mEq/L   Chloride 101 96 - 112 mEq/L   CO2 30 19 - 32 mEq/L   Glucose, Bld 85 70 - 99 mg/dL    BUN 17 6 - 23 mg/dL   Creatinine, Ser 0.69 0.40 - 1.20 mg/dL   Total Bilirubin 0.6 0.2 - 1.2 mg/dL   Alkaline Phosphatase 66 39 - 117 U/L   AST 22 0 - 37 U/L   ALT 17 0 - 35 U/L   Total Protein 7.2 6.0 - 8.3 g/dL   Albumin 4.3 3.5 - 5.2 g/dL   Calcium 9.5 8.4 - 10.5 mg/dL   GFR 89.34 >60.00 mL/min   Assessment/Plan: 1. Viral URI Exam  is unremarkable today. No findings consistent with asthma exacerbation or bacterial respiratory infection. However with 1 - 1.5 days of symptoms it is hard to tell what the course will be. Discussed resumption of chronic allergy medications. Albuterol q6h as needed for wheeze or chest tightness. Begin Mucinex BID. FU if symptoms are not improving over the next 3-5 days.

## 2015-12-30 NOTE — Progress Notes (Signed)
Pre visit review using our clinic tool,if applicable. No additional management support is needed unless otherwise documented below in the visit note.  

## 2015-12-30 NOTE — Patient Instructions (Signed)
Your exam is good today. Keep staying hydrated. Continue Mucinex twice daily. Continue Singulair and Zantac as directed. Put a humidifier in the bedroom.  Please use albuterol inhaler every 6 hours for chest tightness.  Follow-up if symptoms are worsening but there is no wheezing on exam today and the exam looks great.

## 2015-12-30 NOTE — Telephone Encounter (Signed)
Late Entry:  12/29/15 @ 4:30 pm  Pt c/o chest congestion and mild chest tightness.  No acute distress noted over the phone.  Able to speak in complete sentences without pauses.  Using inhaler every 6 hours and taking Mucinex.  Pt concerned about the possibility of an asthma exacerbation.  Appt scheduled for tomorrow, 12/30/15, at 11:15 am with Elyn Aquas, PA-C.  Pt advised to continue self-treating as reported.  If symptoms worsen, to go to the ER.  Pt stated understanding and agreed with plan.

## 2016-01-01 ENCOUNTER — Encounter: Payer: Self-pay | Admitting: Physician Assistant

## 2016-01-02 MED ORDER — DOXYCYCLINE HYCLATE 100 MG PO CAPS: 100.0000 mg | ORAL_CAPSULE | Freq: Two times a day (BID) | ORAL | Status: DC

## 2016-01-06 ENCOUNTER — Encounter: Payer: Self-pay | Admitting: Physician Assistant

## 2016-01-06 ENCOUNTER — Other Ambulatory Visit: Payer: Self-pay | Admitting: Physician Assistant

## 2016-01-06 ENCOUNTER — Ambulatory Visit (INDEPENDENT_AMBULATORY_CARE_PROVIDER_SITE_OTHER): Payer: Medicare Other | Admitting: Physician Assistant

## 2016-01-06 VITALS — BP 106/70 | HR 63 | Temp 99.1°F | Resp 16 | Ht 62.0 in | Wt 158.1 lb

## 2016-01-06 DIAGNOSIS — J00 Acute nasopharyngitis [common cold]: Secondary | ICD-10-CM

## 2016-01-06 DIAGNOSIS — J208 Acute bronchitis due to other specified organisms: Principal | ICD-10-CM

## 2016-01-06 DIAGNOSIS — B9689 Other specified bacterial agents as the cause of diseases classified elsewhere: Secondary | ICD-10-CM

## 2016-01-06 LAB — CBC WITH DIFFERENTIAL/PLATELET
BASOS PCT: 0 % (ref 0.0–3.0)
Basophils Absolute: 0 10*3/uL (ref 0.0–0.1)
EOS PCT: 0.4 % (ref 0.0–5.0)
Eosinophils Absolute: 0 10*3/uL (ref 0.0–0.7)
HEMATOCRIT: 38.9 % (ref 36.0–46.0)
HEMOGLOBIN: 13.2 g/dL (ref 12.0–15.0)
Lymphocytes Relative: 10.4 % — ABNORMAL LOW (ref 12.0–46.0)
Lymphs Abs: 1 10*3/uL (ref 0.7–4.0)
MCHC: 33.9 g/dL (ref 30.0–36.0)
MCV: 89.7 fl (ref 78.0–100.0)
MONOS PCT: 9 % (ref 3.0–12.0)
Monocytes Absolute: 0.8 10*3/uL (ref 0.1–1.0)
Neutro Abs: 7.4 10*3/uL (ref 1.4–7.7)
Neutrophils Relative %: 80.2 % — ABNORMAL HIGH (ref 43.0–77.0)
Platelets: 155 10*3/uL (ref 150.0–400.0)
RBC: 4.34 Mil/uL (ref 3.87–5.11)
RDW: 13.8 % (ref 11.5–15.5)
WBC: 9.2 10*3/uL (ref 4.0–10.5)

## 2016-01-06 MED ORDER — HYOSCYAMINE SULFATE 0.125 MG SL SUBL: 0.1250 mg | SUBLINGUAL_TABLET | Freq: Four times a day (QID) | SUBLINGUAL | Status: DC | PRN

## 2016-01-06 MED ORDER — AMOXICILLIN-POT CLAVULANATE 875-125 MG PO TABS: 1.0000 | ORAL_TABLET | Freq: Two times a day (BID) | ORAL | Status: DC

## 2016-01-06 NOTE — Progress Notes (Signed)
Patient presents to clinic today c/o continued productive cough and chest congestion despite use of Doxycycline. Denies fever, chills. Denies chest pain but notes mild wheezing that is improved from last visit. Endorses Doxycycline is causing diarrhea and upset stomach.   Past Medical History  Diagnosis Date  . Asthma   . GERD (gastroesophageal reflux disease)     gastric polyp x3  . Osteopenia     last 07/2011  . Fibromyalgia   . Glaucoma      Dr Bing Plume  . IBS (irritable bowel syndrome)   . Diverticulosis   . Pneumonia      OP as child  . Diverticulitis   . Fasting hyperglycemia 04/2011    FBS 108  . Fundic gland polyps of stomach, benign   . CAP (community acquired pneumonia) 08/2012    Dr Birdie Riddle  . Shingles     Current Outpatient Prescriptions on File Prior to Visit  Medication Sig Dispense Refill  . acetaminophen (TYLENOL) 500 MG tablet Take 500 mg by mouth as needed.    Marland Kitchen albuterol (PROVENTIL HFA;VENTOLIN HFA) 108 (90 BASE) MCG/ACT inhaler Inhale 2 puffs into the lungs every 6 (six) hours as needed for wheezing or shortness of breath. As needed only 1 Inhaler 1  . albuterol (PROVENTIL) (2.5 MG/3ML) 0.083% nebulizer solution Take 2.5 mg by nebulization every 6 (six) hours as needed. Reported on 12/30/2015    . bifidobacterium infantis (ALIGN) capsule Take 1 capsule by mouth daily. 21 capsule 0  . BLACK ELDERBERRY,BERRY-FLOWER, PO Take by mouth.    . cetirizine (ZYRTEC) 10 MG tablet Take 10 mg by mouth as needed.     . Cholecalciferol (VITAMIN D3) 2000 UNITS TABS Take by mouth daily.    Marland Kitchen doxycycline (VIBRAMYCIN) 100 MG capsule Take 1 capsule (100 mg total) by mouth 2 (two) times daily. 14 capsule 0  . EPINEPHrine (EPI-PEN) 0.3 mg/0.3 mL DEVI Inject 0.3 mLs (0.3 mg total) into the muscle once. 1 Device 0  . esomeprazole (NEXIUM) 40 MG capsule Take 40 mg by mouth as needed. She uses prn to alternate between this and the omeprazole    . Flaxseed, Linseed, (GNP FLAX SEED OIL  PO) Take 1 mL by mouth daily.    . fluticasone (FLONASE) 50 MCG/ACT nasal spray INSTILL 2 SPRAYS IN EACH NOSTRIL ONCE DAILY 48 g 3  . GuaiFENesin (MUCINEX PO) Take by mouth as needed.     . hyoscyamine (LEVSIN SL) 0.125 MG SL tablet Place 0.125 mg under the tongue every 6 (six) hours as needed. 1 under tongue every 6 hrs prn for abdominal pain     . metoprolol tartrate (LOPRESSOR) 25 MG tablet TAKE ONE TABLET BY MOUTH TWICE DAILY **TAKE  ONE  EXTRA  TABLET  AS  NEEDED** 270 tablet 1  . montelukast (SINGULAIR) 10 MG tablet Take 1 tablet (10 mg total) by mouth daily. 90 tablet 1  . Multiple Minerals-Vitamins (CALCIUM CITRATE PLUS/MAGNESIUM PO) Take by mouth.    . nystatin cream (MYCOSTATIN) Apply 1 application topically 2 (two) times daily. 90 g 3  . nystatin-triamcinolone (MYCOLOG II) cream Apply 1 application topically as needed.    . Omega-3 Fatty Acids (FISH OIL PO) Take by mouth.    Marland Kitchen omeprazole (PRILOSEC) 40 MG capsule Take 1 capsule (40 mg total) by mouth daily. 90 capsule 0  . ranitidine (ZANTAC) 150 MG tablet Take 150 mg by mouth at bedtime.     . sodium chloride (OCEAN) 0.65 % SOLN nasal  spray Place 1 spray into both nostrils as needed for congestion.    Marland Kitchen Specialty Vitamins Products (RETAINE VISION PO) Take by mouth.    . traMADol (ULTRAM) 50 MG tablet Take 1 tablet (50 mg total) by mouth every 6 (six) hours as needed. 40 tablet 0  . travoprost, benzalkonium, (TRAVATAN) 0.004 % ophthalmic solution Place 1 drop into both eyes at bedtime.    . triamcinolone cream (KENALOG) 0.1 % Apply 1 application topically 2 (two) times daily. 90 g 3  . Vitamin Mixture (ESTER-C PO) Take 1,000 mg by mouth daily.     Current Facility-Administered Medications on File Prior to Visit  Medication Dose Route Frequency Provider Last Rate Last Dose  . denosumab (PROLIA) injection 60 mg  60 mg Subcutaneous Once Rosalita Chessman Chase, DO        Allergies  Allergen Reactions  . Cephalosporins     Blisters   orally  . Gabapentin     REACTION: rash  . Levofloxacin     REACTION: stomach ache and rash  . Psyllium     REACTION: rash  . Sulfonamide Derivatives     REACTION: shock, urticaria  . Aspirin     REACTION: stomach pain  . Belladonna   . Ciprofloxacin Hcl   . Conjugated Estrogens   . Nabumetone   . Nsaids   . Soybean-Containing Drug Products Other (See Comments)    GI upset  . Zicam Cold Remedy [Erysidoron #1]   . Prednisone     NO PROBLEM WITH MEDROL DOSE PAK    Family History  Problem Relation Age of Onset  . Breast cancer Mother   . Breast cancer Sister   . Breast cancer Maternal Aunt     two  . Breast cancer Paternal Aunt   . Asthma Paternal Aunt   . Heart failure Father     CHF  . Kidney disease Maternal Grandmother   . Ovarian cancer      Niece x2  . Coronary artery disease Paternal Aunt     triple CABG  . Heart attack Paternal Grandmother     MI in late 56s  . Heart disease Paternal Grandfather   . Heart attack Paternal Uncle      MI in 13s  . Heart disease Maternal Aunt   . Diabetes Neg Hx   . Stroke Neg Hx   . COPD Neg Hx   . Breast cancer      niece  . Pulmonary embolism Son   . Deep vein thrombosis Son     Social History   Social History  . Marital Status: Married    Spouse Name: N/A  . Number of Children: N/A  . Years of Education: N/A   Social History Main Topics  . Smoking status: Former Smoker -- 0.50 packs/day for 10 years    Types: Cigarettes    Quit date: 09/10/1974  . Smokeless tobacco: Never Used     Comment: smoked 1966- 1976, up to 1 ppd  . Alcohol Use: No  . Drug Use: No  . Sexual Activity: Yes   Other Topics Concern  . None   Social History Narrative   Daily caffeine 3 cups   Regular exercise   Married         Review of Systems - See HPI.  All other ROS are negative.  BP 106/70 mmHg  Pulse 63  Temp(Src) 99.1 F (37.3 C) (Oral)  Resp 16  Ht '5\' 2"'  (1.575  m)  Wt 158 lb 2 oz (71.725 kg)  BMI 28.91 kg/m2   SpO2 98%  Physical Exam  Constitutional: She is well-developed, well-nourished, and in no distress.  HENT:  Head: Normocephalic and atraumatic.  Right Ear: External ear normal.  Left Ear: External ear normal.  Nose: Nose normal.  Mouth/Throat: Oropharynx is clear and moist. No oropharyngeal exudate.  TM within normal limits.  Eyes: Conjunctivae are normal.  Cardiovascular: Normal rate, regular rhythm, normal heart sounds and intact distal pulses.   Pulmonary/Chest: Effort normal and breath sounds normal. No respiratory distress. She has no wheezes. She has no rales. She exhibits no tenderness.  Skin: Skin is warm and dry. No rash noted.  Psychiatric: Affect normal.  Vitals reviewed.   Recent Results (from the past 2160 hour(s))  Lipid panel     Status: Abnormal   Collection Time: 12/01/15  9:02 AM  Result Value Ref Range   Cholesterol 219 (H) 0 - 200 mg/dL    Comment: ATP III Classification       Desirable:  < 200 mg/dL               Borderline High:  200 - 239 mg/dL          High:  > = 240 mg/dL   Triglycerides 90.0 0.0 - 149.0 mg/dL    Comment: Normal:  <150 mg/dLBorderline High:  150 - 199 mg/dL   HDL 63.40 >39.00 mg/dL   VLDL 18.0 0.0 - 40.0 mg/dL   LDL Cholesterol 137 (H) 0 - 99 mg/dL   Total CHOL/HDL Ratio 3     Comment:                Men          Women1/2 Average Risk     3.4          3.3Average Risk          5.0          4.42X Average Risk          9.6          7.13X Average Risk          15.0          11.0                       NonHDL 155.43     Comment: NOTE:  Non-HDL goal should be 30 mg/dL higher than patient's LDL goal (i.e. LDL goal of < 70 mg/dL, would have non-HDL goal of < 100 mg/dL)  Comp Met (CMET)     Status: None   Collection Time: 12/01/15  9:02 AM  Result Value Ref Range   Sodium 137 135 - 145 mEq/L   Potassium 4.2 3.5 - 5.1 mEq/L   Chloride 101 96 - 112 mEq/L   CO2 30 19 - 32 mEq/L   Glucose, Bld 85 70 - 99 mg/dL   BUN 17 6 - 23 mg/dL   Creatinine,  Ser 0.69 0.40 - 1.20 mg/dL   Total Bilirubin 0.6 0.2 - 1.2 mg/dL   Alkaline Phosphatase 66 39 - 117 U/L   AST 22 0 - 37 U/L   ALT 17 0 - 35 U/L   Total Protein 7.2 6.0 - 8.3 g/dL   Albumin 4.3 3.5 - 5.2 g/dL   Calcium 9.5 8.4 - 10.5 mg/dL   GFR 89.34 >60.00 mL/min    Assessment/Plan: 1. Acute bacterial bronchitis Will stop  Doxycycline and begin Augmentin. Will check CBC today due diarrhea and abdominal cramping. Restart Levsin. Increase fluids. Lungs sound good. Continue asthma medications as directed. FU precautions given to patient.  - CBC w/Diff

## 2016-01-06 NOTE — Patient Instructions (Addendum)
Please stop the Doxycycline. Start the Augmentin. Take with food and a daily robitussin for cough. Increase fluids. Rest. Start a daily probiotic. Continue the Albuterol as directed. Eat a bland diet and continue your Hyoscyamine as directed.  Stop by the lab for blood work.

## 2016-01-06 NOTE — Progress Notes (Signed)
Pre visit review using our clinic review tool, if applicable. No additional management support is needed unless otherwise documented below in the visit note/SLS  

## 2016-01-25 ENCOUNTER — Other Ambulatory Visit: Payer: Self-pay | Admitting: Internal Medicine

## 2016-02-08 ENCOUNTER — Encounter: Payer: Self-pay | Admitting: Internal Medicine

## 2016-02-08 ENCOUNTER — Ambulatory Visit (INDEPENDENT_AMBULATORY_CARE_PROVIDER_SITE_OTHER): Payer: Medicare Other | Admitting: Internal Medicine

## 2016-02-08 VITALS — BP 120/70 | HR 60 | Ht 62.0 in | Wt 158.8 lb

## 2016-02-08 DIAGNOSIS — M810 Age-related osteoporosis without current pathological fracture: Secondary | ICD-10-CM

## 2016-02-08 DIAGNOSIS — K219 Gastro-esophageal reflux disease without esophagitis: Secondary | ICD-10-CM | POA: Diagnosis not present

## 2016-02-08 DIAGNOSIS — K589 Irritable bowel syndrome without diarrhea: Secondary | ICD-10-CM | POA: Diagnosis not present

## 2016-02-08 DIAGNOSIS — Z1211 Encounter for screening for malignant neoplasm of colon: Secondary | ICD-10-CM

## 2016-02-08 NOTE — Patient Instructions (Signed)
You have been scheduled for a colonoscopy. Please follow written instructions given to you at your visit today.  Please pick up your prep supplies at the pharmacy. If you use inhalers (even only as needed), please bring them with you on the day of your procedure.    Try and wren off your PPI.    I appreciate the opportunity to care for you. Silvano Rusk, MD, [facility]

## 2016-02-08 NOTE — Progress Notes (Signed)
   Subjective:    Patient ID: Kayla [name], female    DOB: 11-01-1945, 70 y.o.   MRN: [id number] Cc; f/u GERD, colon cancer screening HPI Doing well at this time Wonders if needs PPI long-term Concerned about osteoporosis risk and that she has it and ? Will it worsen. Had dental surgery and is concerned that if she continues to take bisphophonates may have bone remodeling problems in jaws Says apple cider vinegar helps a lot BM's regular no recent IBS problems  Medications, allergies, past medical history, past surgical history, family history and social history are reviewed and updated in the EMR.  Review of Systems As above    Objective:   Physical Exam @BP  120/70 mmHg  Pulse 60  Ht 5\' 2"  (1.575 m)  Wt 158 lb 12.8 oz (72.031 kg)  BMI 29.04 kg/m2@  General:  NAD Eyes:   anicteric Lungs:  clear Heart:: S1S2 no rubs, murmurs or gallops Abdomen:  soft and nontender, BS+ Ext:   no edema, cyanosis or clubbing  Data Reviewed:   2007 colonoscopy (negative) 2010 EGD NL CBC 12/2015    Assessment & Plan:   Encounter Diagnoses  Name Primary?  . Gastroesophageal reflux disease without esophagitis Yes  . IBS (irritable bowel syndrome)   . Colon cancer screening   . Osteoporosis     Try to wean ppi Apple cider vinegar Had dental issue + osteoporosis and wants to stay away from meds - agree Screening colonoscopy  The risks and benefits as well as alternatives of endoscopic procedure(s) have been discussed and reviewed. All questions answered. The patient agrees to proceed.  I appreciate the opportunity to care for this patient.  [id number] R Carollee Herter, DO

## 2016-02-09 ENCOUNTER — Encounter: Payer: Self-pay | Admitting: Internal Medicine

## 2016-02-27 ENCOUNTER — Ambulatory Visit (INDEPENDENT_AMBULATORY_CARE_PROVIDER_SITE_OTHER): Payer: Medicare Other | Admitting: Family Medicine

## 2016-02-27 ENCOUNTER — Encounter: Payer: Self-pay | Admitting: Family Medicine

## 2016-02-27 VITALS — BP 112/72 | HR 68 | Temp 99.3°F | Ht 62.0 in | Wt 157.8 lb

## 2016-02-27 DIAGNOSIS — I10 Essential (primary) hypertension: Secondary | ICD-10-CM

## 2016-02-27 DIAGNOSIS — E785 Hyperlipidemia, unspecified: Secondary | ICD-10-CM

## 2016-02-27 DIAGNOSIS — M810 Age-related osteoporosis without current pathological fracture: Secondary | ICD-10-CM | POA: Diagnosis not present

## 2016-02-27 LAB — LIPID PANEL
CHOLESTEROL: 215 mg/dL — AB (ref 0–200)
HDL: 60.3 mg/dL (ref >39.00)
LDL CALC: 130 mg/dL — AB (ref 0–99)
NonHDL: 154.48
Total CHOL/HDL Ratio: 4
Triglycerides: 120 mg/dL (ref 0.0–149.0)
VLDL: 24 mg/dL (ref 0.0–40.0)

## 2016-02-27 LAB — COMPREHENSIVE METABOLIC PANEL
ALBUMIN: 4.3 g/dL (ref 3.5–5.2)
ALT: 25 U/L (ref 0–35)
AST: 31 U/L (ref 0–37)
Alkaline Phosphatase: 64 U/L (ref 39–117)
BUN: 14 mg/dL (ref 6–23)
CALCIUM: 9.5 mg/dL (ref 8.4–10.5)
CHLORIDE: 100 meq/L (ref 96–112)
CO2: 26 mEq/L (ref 19–32)
Creatinine, Ser: 0.72 mg/dL (ref 0.40–1.20)
GFR: 85 mL/min (ref >60.00)
Glucose, Bld: 94 mg/dL (ref 70–99)
POTASSIUM: 4.2 meq/L (ref 3.5–5.1)
SODIUM: 135 meq/L (ref 135–145)
Total Bilirubin: 0.5 mg/dL (ref 0.2–1.2)
Total Protein: 7.3 g/dL (ref 6.0–8.3)

## 2016-02-27 NOTE — Assessment & Plan Note (Signed)
con't with prolia Recheck bmd in dec and we will discuss further tx then

## 2016-02-27 NOTE — Progress Notes (Signed)
Pre visit review using our clinic review tool, if applicable. No additional management support is needed unless otherwise documented below in the visit note. 

## 2016-02-27 NOTE — Progress Notes (Signed)
Patient ID: Kayla [name], female    DOB: 09-Nov-1945  Age: 70 y.o. MRN: [id number]    Subjective:  Subjective HPI Kayla [name] presents for f/u cholesterol --- pt has been active and walking  She is concerned about the prolia-- Kayla [name] dentist told Kayla [name] it would prevent Kayla [name] jaw from healing.   She wants to stop the prolia.   She also c/o back and hip pain after sitting with Kayla [name] husband in the hospital for a stent.  No fall or known injury.  Review of Systems  Constitutional: Negative for diaphoresis, appetite change, fatigue and unexpected weight change.  Eyes: Negative for pain, redness and visual disturbance.  Respiratory: Negative for cough, chest tightness, shortness of breath and wheezing.   Cardiovascular: Negative for chest pain, palpitations and leg swelling.  Endocrine: Negative for cold intolerance, heat intolerance, polydipsia, polyphagia and polyuria.  Genitourinary: Negative for dysuria, frequency and difficulty urinating.  Musculoskeletal: Positive for back pain. Negative for gait problem.  Neurological: Negative for dizziness, light-headedness, numbness and headaches.    History Past Medical History  Diagnosis Date  . Asthma   . GERD (gastroesophageal reflux disease)     gastric polyp x3  . Osteopenia     last 07/2011  . Fibromyalgia   . Glaucoma      Dr Bing Plume  . IBS (irritable bowel syndrome)   . Diverticulosis   . Pneumonia      OP as child  . Diverticulitis   . Fasting hyperglycemia 04/2011    FBS 108  . Fundic gland polyps of stomach, benign   . CAP (community acquired pneumonia) 08/2012    Dr Birdie Riddle  . Shingles     She has past surgical history that includes Spine surgery; Colonoscopy w/ polypectomy [id number]); and Esophagogastroduodenoscopy.   Kayla [name] family history includes Asthma in Kayla [name] paternal aunt; Breast cancer in Kayla [name] maternal aunt, mother, paternal aunt, and sister; Coronary artery disease in Kayla [name] paternal aunt; Deep vein thrombosis in Kayla [name]  son; Heart attack in Kayla [name] paternal grandmother and paternal uncle; Heart disease in Kayla [name] maternal aunt and paternal grandfather; Heart failure in Kayla [name] father; Kidney disease in Kayla [name] maternal grandmother; Pulmonary embolism in Kayla [name] son. There is no history of Diabetes, Stroke, or COPD.She reports that she quit smoking about 41 years ago. Kayla [name] smoking use included Cigarettes. She has a 5 pack-year smoking history. She has never used smokeless tobacco. She reports that she does not drink alcohol or use illicit drugs.  Current Outpatient Prescriptions on File Prior to Visit  Medication Sig Dispense Refill  . acetaminophen (TYLENOL) 500 MG tablet Take 500 mg by mouth as needed.    Marland Kitchen albuterol (PROVENTIL HFA;VENTOLIN HFA) 108 (90 BASE) MCG/ACT inhaler Inhale 2 puffs into the lungs every 6 (six) hours as needed for wheezing or shortness of breath. As needed only 1 Inhaler 1  . albuterol (PROVENTIL) (2.5 MG/3ML) 0.083% nebulizer solution Take 2.5 mg by nebulization every 6 (six) hours as needed. Reported on 12/30/2015    . bifidobacterium infantis (ALIGN) capsule Take 1 capsule by mouth daily. 21 capsule 0  . BLACK ELDERBERRY,BERRY-FLOWER, PO Take by mouth.    . cetirizine (ZYRTEC) 10 MG tablet Take 10 mg by mouth as needed.     . Cholecalciferol (VITAMIN D3) 2000 UNITS TABS Take by mouth daily.    Marland Kitchen EPINEPHrine (EPI-PEN) 0.3 mg/0.3 mL DEVI Inject 0.3 mLs (0.3 mg total) into the muscle once. 1 Device 0  . esomeprazole (NEXIUM) 40 MG capsule Take  40 mg by mouth as needed. She uses prn to alternate between this and the omeprazole    . Flaxseed, Linseed, (GNP FLAX SEED OIL PO) Take 1 mL by mouth daily.    . fluticasone (FLONASE) 50 MCG/ACT nasal spray INSTILL 2 SPRAYS IN EACH NOSTRIL ONCE DAILY 48 g 3  . GuaiFENesin (MUCINEX PO) Take by mouth as needed.     . hyoscyamine (LEVSIN SL) 0.125 MG SL tablet DISSOLVE 1 TABLET UNDER THE TONGUE EVERY 6 HOURS AS NEEDED FOR ABDOMINAL PAIN 385 tablet 0  . metoprolol tartrate  (LOPRESSOR) 25 MG tablet TAKE ONE TABLET BY MOUTH TWICE DAILY **TAKE  ONE  EXTRA  TABLET  AS  NEEDED** 270 tablet 1  . montelukast (SINGULAIR) 10 MG tablet Take 1 tablet (10 mg total) by mouth daily. 90 tablet 1  . Multiple Minerals-Vitamins (CALCIUM CITRATE PLUS/MAGNESIUM PO) Take by mouth.    . nystatin cream (MYCOSTATIN) Apply 1 application topically 2 (two) times daily. 90 g 3  . Omega-3 Fatty Acids (FISH OIL PO) Take by mouth.    Marland Kitchen omeprazole (PRILOSEC) 40 MG capsule Take 1 capsule by mouth  daily 90 capsule 0  . ranitidine (ZANTAC) 150 MG tablet Take 150 mg by mouth at bedtime.     . sodium chloride (OCEAN) 0.65 % SOLN nasal spray Place 1 spray into both nostrils as needed for congestion.    Marland Kitchen Specialty Vitamins Products (RETAINE VISION PO) Take by mouth.    . travoprost, benzalkonium, (TRAVATAN) 0.004 % ophthalmic solution Place 1 drop into both eyes at bedtime.    . triamcinolone cream (KENALOG) 0.1 % Apply 1 application topically 2 (two) times daily. 90 g 3   No current facility-administered medications on file prior to visit.     Objective:  Objective Physical Exam  Constitutional: She is oriented to person, place, and time. She appears well-developed and well-nourished.  HENT:  Head: Normocephalic and atraumatic.  Eyes: Conjunctivae and EOM are normal.  Neck: Normal range of motion. Neck supple. No JVD present. Carotid bruit is not present. No thyromegaly present.  Cardiovascular: Normal rate, regular rhythm and normal heart sounds.   No murmur heard. Pulmonary/Chest: Effort normal and breath sounds normal. No respiratory distress. She has no wheezes. She has no rales. She exhibits no tenderness.  Musculoskeletal: Normal range of motion. She exhibits tenderness. She exhibits no edema.       Lumbar back: She exhibits tenderness, pain and spasm. She exhibits normal range of motion.  Neurological: She is alert and oriented to person, place, and time.  Psychiatric: She has a  normal mood and affect. Kayla [name] behavior is normal. Judgment and thought content normal.  Nursing note and vitals reviewed.  BP 112/72 mmHg  Pulse 68  Temp(Src) 99.3 F (37.4 C) (Oral)  Ht 5\' 2"  (1.575 m)  Wt 157 lb 12.8 oz (71.578 kg)  BMI 28.85 kg/m2  SpO2 96% Wt Readings from Last 3 Encounters:  02/27/16 157 lb 12.8 oz (71.578 kg)  02/08/16 158 lb 12.8 oz (72.031 kg)  01/06/16 158 lb 2 oz (71.725 kg)     Lab Results  Component Value Date   WBC 9.2 01/06/2016   HGB 13.2 01/06/2016   HCT 38.9 01/06/2016   PLT 155.0 01/06/2016   GLUCOSE 85 12/01/2015   CHOL 219* 12/01/2015   TRIG 90.0 12/01/2015   HDL 63.40 12/01/2015   LDLDIRECT 153.3 12/18/2012   LDLCALC 137* 12/01/2015   ALT 17 12/01/2015   AST 22 12/01/2015  NA 137 12/01/2015   K 4.2 12/01/2015   CL 101 12/01/2015   CREATININE 0.69 12/01/2015   BUN 17 12/01/2015   CO2 30 12/01/2015   TSH 1.22 12/18/2012   INR 0.97 12/15/2009   HGBA1C 5.4 11/01/2011    US Soft Tissue Head/neck  03/30/2015  CLINICAL DATA:  Left lateral neck swelling x3 months EXAM: ULTRASOUND OF HEAD/NECK SOFT TISSUES TECHNIQUE: Ultrasound examination of the head and neck soft tissues was performed in the area of clinical concern. COMPARISON:  None. FINDINGS: No mass, adenopathy, cyst, abscess, or other lesion in the region of concern. Contralateral imaging is symmetric and unremarkable. IMPRESSION: 1. No ultrasound correlate in region of concern. Electronically Signed   By: Lucrezia Europe M.D.   On: 03/30/2015 15:40     Assessment & Plan:  Plan I have discontinued Kayla [name]'s Vitamin Mixture (ESTER-C PO). I am also having Kayla [name] maintain Kayla [name] GuaiFENesin (MUCINEX PO), EPINEPHrine, ranitidine, acetaminophen, Vitamin D3, cetirizine, albuterol, esomeprazole, bifidobacterium infantis, travoprost (benzalkonium), (Flaxseed, Linseed, (GNP FLAX SEED OIL PO)), Omega-3 Fatty Acids (FISH OIL PO), Multiple Minerals-Vitamins (CALCIUM CITRATE PLUS/MAGNESIUM PO), nystatin  cream, triamcinolone cream, (BLACK ELDERBERRY,BERRY-FLOWER, PO), sodium chloride, Specialty Vitamins Products (RETAINE VISION PO), albuterol, fluticasone, montelukast, metoprolol tartrate, hyoscyamine, and omeprazole. We will stop administering denosumab.  No orders of the defined types were placed in this encounter.    Problem List Items Addressed This Visit    Hyperlipidemia - Primary   Relevant Orders   Lipid panel   Comprehensive metabolic panel   Osteoporosis    con't with prolia Recheck bmd in dec and we will discuss further tx then         Follow-up: Return in about 6 months (around 08/28/2016), or if symptoms worsen or fail to improve.  [name], DO

## 2016-02-27 NOTE — Patient Instructions (Signed)

## 2016-03-01 ENCOUNTER — Encounter: Payer: Self-pay | Admitting: Internal Medicine

## 2016-03-15 ENCOUNTER — Ambulatory Visit (AMBULATORY_SURGERY_CENTER): Payer: Medicare Other | Admitting: Internal Medicine

## 2016-03-15 ENCOUNTER — Encounter: Payer: Self-pay | Admitting: Internal Medicine

## 2016-03-15 VITALS — BP 103/68 | HR 47 | Temp 97.3°F | Resp 14 | Ht 62.0 in | Wt 158.0 lb

## 2016-03-15 DIAGNOSIS — Z1211 Encounter for screening for malignant neoplasm of colon: Secondary | ICD-10-CM

## 2016-03-15 DIAGNOSIS — Z86010 Personal history of colonic polyps: Secondary | ICD-10-CM | POA: Diagnosis not present

## 2016-03-15 MED ORDER — SODIUM CHLORIDE 0.9 % IV SOLN: 500.0000 mL | INTRAVENOUS | Status: DC

## 2016-03-15 NOTE — Patient Instructions (Addendum)
No polyps or cancer! You do have diverticulosis - thickened muscle rings and pouches in the colon wall. Please read the handout about this condition.  I do not think you need another routine colonoscopy.  Good luck with stopping the omeprazole.  I appreciate the opportunity to care for you. Gatha Mayer, MD, FACG  YOU HAD AN ENDOSCOPIC PROCEDURE TODAY AT [facility]:   Refer to the procedure report that was given to you for any specific questions about what was found during the examination.  If the procedure report does not answer your questions, please call your gastroenterologist to clarify.  If you requested that your care partner not be given the details of your procedure findings, then the procedure report has been included in a sealed envelope for you to review at your convenience later.  YOU SHOULD EXPECT: Some feelings of bloating in the abdomen. Passage of more gas than usual.  Walking can help get rid of the air that was put into your GI tract during the procedure and reduce the bloating. If you had a lower endoscopy (such as a colonoscopy or flexible sigmoidoscopy) you may notice spotting of blood in your stool or on the toilet paper. If you underwent a bowel prep for your procedure, you may not have a normal bowel movement for a few days.  Please Note:  You might notice some irritation and congestion in your nose or some drainage.  This is from the oxygen used during your procedure.  There is no need for concern and it should clear up in a day or so.  SYMPTOMS TO REPORT IMMEDIATELY:   Following lower endoscopy (colonoscopy or flexible sigmoidoscopy):  Excessive amounts of blood in the stool  Significant tenderness or worsening of abdominal pains  Swelling of the abdomen that is new, acute  Fever of 100F or higher   For urgent or emergent issues, a gastroenterologist can be reached at any hour by calling [phone].   DIET: Your first meal  following the procedure should be a small meal and then it is ok to progress to your normal diet. Heavy or fried foods are harder to digest and may make you feel nauseous or bloated.  Likewise, meals heavy in dairy and vegetables can increase bloating.  Drink plenty of fluids but you should avoid alcoholic beverages for 24 hours.  ACTIVITY:  You should plan to take it easy for the rest of today and you should NOT DRIVE or use heavy machinery until tomorrow (because of the sedation medicines used during the test).    FOLLOW UP: Our staff will call the number listed on your records the next business day following your procedure to check on you and address any questions or concerns that you may have regarding the information given to you following your procedure. If we do not reach you, we will leave a message.  However, if you are feeling well and you are not experiencing any problems, there is no need to return our call.  We will assume that you have returned to your regular daily activities without incident.  If any biopsies were taken you will be contacted by phone or by letter within the next 1-3 weeks.  Please call us at [phone] if you have not heard about the biopsies in 3 weeks.    SIGNATURES/CONFIDENTIALITY: You and/or your care partner have signed paperwork which will be entered into your electronic medical record.  These signatures attest to the  fact that that the information above on your After Visit Summary has been reviewed and is understood.  Full responsibility of the confidentiality of this discharge information lies with you and/or your care-partner.  Diverticulosis information given.  No recall needed.

## 2016-03-15 NOTE — Op Note (Signed)
[place] Patient Name: Kayla [name] Procedure Date: 03/15/2016 1:30 PM MRN: [id number] Endoscopist: Gatha Mayer , MD Age: 70 Referring MD:  Date of Birth: 1946/07/28 Gender: Female Account #: [id number] Procedure:                Colonoscopy Indications:              Screening for colorectal malignant neoplasm Medicines:                Propofol per Anesthesia, Monitored Anesthesia Care Procedure:                Pre-Anesthesia Assessment:                           - Prior to the procedure, a History and Physical                            was performed, and patient medications and                            allergies were reviewed. The patient's tolerance of                            previous anesthesia was also reviewed. The risks                            and benefits of the procedure and the sedation                            options and risks were discussed with the patient.                            All questions were answered, and informed consent                            was obtained. Prior Anticoagulants: The patient has                            taken no previous anticoagulant or antiplatelet                            agents. ASA Grade Assessment: II - A patient with                            mild systemic disease. After reviewing the risks                            and benefits, the patient was deemed in                            satisfactory condition to undergo the procedure.                           After obtaining informed consent, the colonoscope  was passed under direct vision. Throughout the                            procedure, the patient's blood pressure, pulse, and                            oxygen saturations were monitored continuously. The                            Model PCF-H190L [id number]) scope was introduced                            through the anus and advanced to the the cecum,          identified by appendiceal orifice and ileocecal                            valve. The ileocecal valve, appendiceal orifice,                            and rectum were photographed. The quality of the                            bowel preparation was excellent. The bowel                            preparation used was Miralax. Scope In: 1:38:05 PM Scope Out: 1:58:09 PM Scope Withdrawal Time: 0 hours 9 minutes 35 seconds  Total Procedure Duration: 0 hours 20 minutes 4 seconds  Findings:                 The perianal and digital rectal examinations were                            normal.                           Multiple small-mouthed diverticula were found in                            the sigmoid colon. There was narrowing of the colon                            in association with the diverticular opening. There                            was no evidence of diverticular bleeding.                           The exam was otherwise without abnormality on                            direct and retroflexion views. Complications:            No immediate complications. Estimated blood loss:  None. Estimated Blood Loss:     Estimated blood loss: none. Recommendation:           - Resume previous diet.                           - Continue present medications.                           - Patient has a contact number available for                            emergencies. The signs and symptoms of potential                            delayed complications were discussed with the                            patient. Return to normal activities tomorrow.                            Written discharge instructions were provided to the                            patient.                           - No repeat colonoscopy due to age. Gatha Mayer, MD 03/15/2016 2:08:16 PM This report has been signed electronically.

## 2016-03-15 NOTE — Progress Notes (Signed)
To recovery, report to Scott, RN, VSS 

## 2016-03-16 ENCOUNTER — Telehealth: Payer: Self-pay

## 2016-03-16 ENCOUNTER — Ambulatory Visit (INDEPENDENT_AMBULATORY_CARE_PROVIDER_SITE_OTHER): Payer: Medicare Other | Admitting: Family Medicine

## 2016-03-16 ENCOUNTER — Encounter: Payer: Self-pay | Admitting: Family Medicine

## 2016-03-16 ENCOUNTER — Telehealth: Payer: Self-pay | Admitting: Family Medicine

## 2016-03-16 VITALS — BP 139/70 | HR 55 | Temp 98.3°F | Resp 16 | Ht 62.0 in | Wt 157.8 lb

## 2016-03-16 DIAGNOSIS — I8002 Phlebitis and thrombophlebitis of superficial vessels of left lower extremity: Secondary | ICD-10-CM | POA: Diagnosis not present

## 2016-03-16 NOTE — Progress Notes (Signed)
OFFICE VISIT  03/16/2016   CC:  Chief Complaint  Patient presents with  . Mass    left lower leg x 3 days   HPI:    Patient is a 70 y.o. Caucasian female who presents for a small lump that feels on left calf area. First noted it 3 days ago after her fingers touched the area and it was tender.  She had been standing up "all night" the night before doing her ironing.   She does have hx of superficial thrombophlebitis in the area of her inner thighs in the remote past. No hx of DVT.    Past Medical History  Diagnosis Date  . Asthma   . GERD (gastroesophageal reflux disease)     gastric polyp x3  . Osteopenia     last 07/2011  . Fibromyalgia   . Glaucoma      Dr Bing Plume  . IBS (irritable bowel syndrome)   . Diverticulosis   . Pneumonia      OP as child  . Diverticulitis   . Fasting hyperglycemia 04/2011    FBS 108  . Fundic gland polyps of stomach, benign   . CAP (community acquired pneumonia) 08/2012    Dr Birdie Riddle  . Shingles     Past Surgical History  Procedure Laterality Date  . Spine surgery      T9-L5 fusions  . Colonoscopy w/ polypectomy  [phone]    last  colonoscopy 2007, Dr Carlean Purl  . Esophagogastroduodenoscopy      Outpatient Prescriptions Prior to Visit  Medication Sig Dispense Refill  . acetaminophen (TYLENOL) 500 MG tablet Take 500 mg by mouth as needed.    Marland Kitchen albuterol (PROVENTIL HFA;VENTOLIN HFA) 108 (90 BASE) MCG/ACT inhaler Inhale 2 puffs into the lungs every 6 (six) hours as needed for wheezing or shortness of breath. As needed only 1 Inhaler 1  . albuterol (PROVENTIL) (2.5 MG/3ML) 0.083% nebulizer solution Take 2.5 mg by nebulization every 6 (six) hours as needed. Reported on 03/15/2016    . bifidobacterium infantis (ALIGN) capsule Take 1 capsule by mouth daily. 21 capsule 0  . BLACK ELDERBERRY,BERRY-FLOWER, PO Take by mouth. Reported on 03/15/2016    . carboxymethylcellulose (REFRESH PLUS) 0.5 % SOLN 1 drop 3 (three) times daily as needed.    .  cetirizine (ZYRTEC) 10 MG tablet Take 10 mg by mouth as needed. Reported on 03/15/2016    . Cholecalciferol (VITAMIN D3) 2000 UNITS TABS Take by mouth daily.    Marland Kitchen EPINEPHrine (EPI-PEN) 0.3 mg/0.3 mL DEVI Inject 0.3 mLs (0.3 mg total) into the muscle once. 1 Device 0  . esomeprazole (NEXIUM) 40 MG capsule Take 40 mg by mouth as needed. Reported on 03/15/2016    . Flaxseed, Linseed, (GNP FLAX SEED OIL PO) Take 1 mL by mouth daily.    . fluticasone (FLONASE) 50 MCG/ACT nasal spray INSTILL 2 SPRAYS IN EACH NOSTRIL ONCE DAILY 48 g 3  . GuaiFENesin (MUCINEX PO) Take by mouth as needed. Reported on 03/15/2016    . hyoscyamine (LEVSIN SL) 0.125 MG SL tablet DISSOLVE 1 TABLET UNDER THE TONGUE EVERY 6 HOURS AS NEEDED FOR ABDOMINAL PAIN 385 tablet 0  . metoprolol tartrate (LOPRESSOR) 25 MG tablet TAKE ONE TABLET BY MOUTH TWICE DAILY **TAKE  ONE  EXTRA  TABLET  AS  NEEDED** 270 tablet 1  . montelukast (SINGULAIR) 10 MG tablet Take 1 tablet (10 mg total) by mouth daily. 90 tablet 1  . Multiple Minerals-Vitamins (CALCIUM CITRATE PLUS/MAGNESIUM PO) Take  by mouth.    . nystatin cream (MYCOSTATIN) Apply 1 application topically 2 (two) times daily. 90 g 3  . Omega-3 Fatty Acids (FISH OIL PO) Take by mouth.    Marland Kitchen omeprazole (PRILOSEC) 40 MG capsule Take 1 capsule by mouth  daily 90 capsule 0  . ranitidine (ZANTAC) 150 MG tablet Take 150 mg by mouth at bedtime.     . sodium chloride (OCEAN) 0.65 % SOLN nasal spray Place 1 spray into both nostrils as needed for congestion.    . travoprost, benzalkonium, (TRAVATAN) 0.004 % ophthalmic solution Place 1 drop into both eyes at bedtime.    . triamcinolone cream (KENALOG) 0.1 % Apply 1 application topically 2 (two) times daily. 90 g 3   No facility-administered medications prior to visit.    Allergies  Allergen Reactions  . Cephalosporins     Blisters  orally  . Gabapentin     REACTION: rash  . Levofloxacin     REACTION: stomach ache and rash  . Psyllium     REACTION:  rash  . Sulfonamide Derivatives     REACTION: shock, urticaria  . Aspirin     REACTION: stomach pain  . Belladonna   . Ciprofloxacin Hcl   . Conjugated Estrogens   . Doxycycline     Abdominal pain  . Nabumetone   . Nsaids   . Soybean-Containing Drug Products Other (See Comments)    GI upset  . Zicam Cold Remedy [Erysidoron #1]   . Prednisone     NO PROBLEM WITH MEDROL DOSE PAK    ROS As per HPI  PE: Blood pressure 139/70, pulse 55, temperature 98.3 F (36.8 C), temperature source Oral, resp. rate 16, height 5\' 2"  (1.575 m), weight 157 lb 12 oz (71.555 kg), SpO2 100 %. Gen: Alert, well appearing.  Patient is oriented to person, place, time, and situation. Legs: scattered varicose veins and spider veins bilat, none with any erythema. There is one palpable on left upper calf region, feels a bit more firm/rubbery texture and is mildly tender to palpation.  No warmth or erythema.  No edema in either leg and no leg asymmetry.  LABS:  none  IMPRESSION AND PLAN:  Superficial phlebitis. Reassured pt. Encouraged warm compress to the area 20 min bid. She does not tolerate any NSAIDs from a GI standpoint, so this treatment is not an option for her.  An After Visit Summary was printed and given to the patient.  FOLLOW UP: Return if symptoms worsen or fail to improve.  Signed:  Crissie Sickles, MD           03/16/2016

## 2016-03-16 NOTE — Telephone Encounter (Signed)
°  Relationship to patient: Self  Can be reached: [phone]  Reason for call: Patient request call back to discuss a knot that has appeared on her outside left calf. States she does not know if it needs to be looked at.

## 2016-03-16 NOTE — Patient Instructions (Signed)
Apply a warm compress to the affected area of your leg for 20 minutes a couple of times a day.

## 2016-03-16 NOTE — Telephone Encounter (Signed)
  Follow up Call-  Call back number 03/15/2016  Post procedure Call Back phone  # [phone]  Permission to leave phone message Yes     Patient questions:  Do you have a fever, pain , or abdominal swelling? No. Pain Score  0 *  Have you tolerated food without any problems? Yes.    Have you been able to return to your normal activities? Yes.    Do you have any questions about your discharge instructions: Diet   No. Medications  No. Follow up visit  No.  Do you have questions or concerns about your Care? No.  Actions: * If pain score is 4 or above: No action needed, pain <4.

## 2016-03-16 NOTE — Telephone Encounter (Signed)
Spoke with patient and she stated that she has a lump in the left calf that is red and sore to touch, she said it has been there for 4 days. I made the patient an Apt with Dr.McGowan today at 2:30 pm for an evaluation.      Kayla [name]

## 2016-03-16 NOTE — Progress Notes (Signed)
Pre visit review using our clinic review tool, if applicable. No additional management support is needed unless otherwise documented below in the visit note. 

## 2016-03-21 DIAGNOSIS — D492 Neoplasm of unspecified behavior of bone, soft tissue, and skin: Secondary | ICD-10-CM | POA: Diagnosis not present

## 2016-03-21 DIAGNOSIS — M722 Plantar fascial fibromatosis: Secondary | ICD-10-CM | POA: Diagnosis not present

## 2016-03-28 ENCOUNTER — Ambulatory Visit (INDEPENDENT_AMBULATORY_CARE_PROVIDER_SITE_OTHER): Payer: Medicare Other | Admitting: Medical

## 2016-03-28 ENCOUNTER — Ambulatory Visit (HOSPITAL_BASED_OUTPATIENT_CLINIC_OR_DEPARTMENT_OTHER)
Admission: RE | Admit: 2016-03-28 | Discharge: 2016-03-28 | Disposition: A | Discharge status: Home / Self Care | Payer: Medicare Other | Source: Ambulatory Visit | Attending: Medical | Admitting: Medical

## 2016-03-28 ENCOUNTER — Encounter: Payer: Self-pay | Admitting: Medical

## 2016-03-28 VITALS — BP 128/78 | HR 76 | Temp 98.8°F | Ht 62.0 in | Wt 157.0 lb

## 2016-03-28 DIAGNOSIS — M79605 Pain in left leg: Secondary | ICD-10-CM | POA: Diagnosis not present

## 2016-03-28 DIAGNOSIS — M25562 Pain in left knee: Secondary | ICD-10-CM | POA: Diagnosis not present

## 2016-03-28 DIAGNOSIS — L089 Local infection of the skin and subcutaneous tissue, unspecified: Secondary | ICD-10-CM | POA: Diagnosis not present

## 2016-03-28 DIAGNOSIS — R21 Rash and other nonspecific skin eruption: Secondary | ICD-10-CM

## 2016-03-28 MED ORDER — HYDROXYZINE HCL 10 MG PO TABS: ORAL_TABLET | ORAL | Status: DC

## 2016-03-28 MED ORDER — AMOXICILLIN-POT CLAVULANATE 875-125 MG PO TABS: 1.0000 | ORAL_TABLET | Freq: Two times a day (BID) | ORAL | Status: DC

## 2016-03-28 MED ORDER — METHYLPREDNISOLONE 4 MG PO TABS: ORAL_TABLET | ORAL | Status: DC

## 2016-03-28 MED ORDER — MUPIROCIN 2 % EX OINT: TOPICAL_OINTMENT | CUTANEOUS | Status: DC

## 2016-03-28 NOTE — Progress Notes (Signed)
Pre visit review using our clinic review tool, if applicable. No additional management support is needed unless otherwise documented below in the visit note. 

## 2016-03-28 NOTE — Progress Notes (Signed)
Subjective:    Patient ID: Kayla [name], female    DOB: May 07, 1946, 70 y.o.   MRN: [id number]  HPI   Pt in for faint rash on her lower ankle areas. She stated the other day more than a week ago she was outside watering some plants. At that time her rt ankle was itching. She did not feel bite. Since then some faint raised red bumps over rt ankle and distal pretibial area. Area still itch some.   Pt then goes into telling me about March 17, 2016 Dr. Williemae Natter told her she may have some superficial phlebitis(lateral aspect was location). The areas feel little new raised area. But area on lateral aspect of calf feels better.   Then pt notes some red spots on her knees that have present for about 2 weeks. Pt thinks area on her knees are a little sore. Feel little warm.     Review of Systems  Constitutional: Negative for fever, chills and fatigue.  Cardiovascular: Negative for chest pain and palpitations.  Gastrointestinal: Negative for abdominal pain.  Musculoskeletal: Negative for myalgias and back pain.       Rt ankle rash.  Bilateral knee rash.   Lt lateral calf- supericial phlebitis but recent new raised bumps.  Skin: Positive for rash.  Neurological: Negative for dizziness, seizures, syncope, weakness, numbness and headaches.       Faint ha with nasal congestion.  Hematological: Negative for adenopathy. Does not bruise/bleed easily.  Psychiatric/Behavioral: Negative for behavioral problems and confusion.     Past Medical History  Diagnosis Date  . Asthma   . GERD (gastroesophageal reflux disease)     gastric polyp x3  . Osteopenia     last 07/2011  . Fibromyalgia   . Glaucoma      Dr Bing Plume  . IBS (irritable bowel syndrome)   . Diverticulosis   . Pneumonia      OP as child  . Diverticulitis   . Fasting hyperglycemia 04/2011    FBS 108  . Fundic gland polyps of stomach, benign   . CAP (community acquired pneumonia) 08/2012    Dr Birdie Riddle  . Shingles      Social  History   Social History  . Marital Status: Married    Spouse Name: N/A  . Number of Children: N/A  . Years of Education: N/A   Occupational History  . Not on file.   Social History Main Topics  . Smoking status: Former Smoker -- 0.50 packs/day for 10 years    Types: Cigarettes    Quit date: 09/10/1974  . Smokeless tobacco: Never Used     Comment: smoked 1966- 1976, up to 1 ppd  . Alcohol Use: No  . Drug Use: No  . Sexual Activity: Yes   Other Topics Concern  . Not on file   Social History Narrative   Daily caffeine 3 cups   Regular exercise   Married          Past Surgical History  Procedure Laterality Date  . Spine surgery      T9-L5 fusions  . Colonoscopy w/ polypectomy  [phone]    last  colonoscopy 2007, Dr Carlean Purl  . Esophagogastroduodenoscopy      Family History  Problem Relation Age of Onset  . Breast cancer Mother   . Breast cancer Sister   . Breast cancer Maternal Aunt     two  . Breast cancer Paternal Aunt   . Asthma Paternal Aunt   .  Heart failure Father     CHF  . Kidney disease Maternal Grandmother   . Ovarian cancer      Niece x2  . Breast cancer      niece  . Coronary artery disease Paternal Aunt     triple CABG  . Heart attack Paternal Grandmother     MI in late 55s  . Heart disease Paternal Grandfather   . Heart attack Paternal Uncle      MI in 55s  . Heart disease Maternal Aunt   . Diabetes Neg Hx   . Stroke Neg Hx   . COPD Neg Hx   . Colon polyps Neg Hx   . Pulmonary embolism Son   . Deep vein thrombosis Son     Allergies  Allergen Reactions  . Cephalosporins     Blisters  orally  . Gabapentin     REACTION: rash  . Levofloxacin     REACTION: stomach ache and rash  . Psyllium     REACTION: rash  . Sulfonamide Derivatives     REACTION: shock, urticaria  . Aspirin     REACTION: stomach pain  . Belladonna   . Ciprofloxacin Hcl   . Conjugated Estrogens   . Doxycycline     Abdominal pain  . Nabumetone   .  Nsaids   . Soybean-Containing Drug Products Other (See Comments)    GI upset  . Zicam Cold Remedy [Erysidoron #1]   . Prednisone     NO PROBLEM WITH MEDROL DOSE PAK    Current Outpatient Prescriptions on File Prior to Visit  Medication Sig Dispense Refill  . acetaminophen (TYLENOL) 500 MG tablet Take 500 mg by mouth as needed.    Marland Kitchen albuterol (PROVENTIL HFA;VENTOLIN HFA) 108 (90 BASE) MCG/ACT inhaler Inhale 2 puffs into the lungs every 6 (six) hours as needed for wheezing or shortness of breath. As needed only 1 Inhaler 1  . albuterol (PROVENTIL) (2.5 MG/3ML) 0.083% nebulizer solution Take 2.5 mg by nebulization every 6 (six) hours as needed. Reported on 03/15/2016    . bifidobacterium infantis (ALIGN) capsule Take 1 capsule by mouth daily. 21 capsule 0  . BLACK ELDERBERRY,BERRY-FLOWER, PO Take by mouth. Reported on 03/15/2016    . carboxymethylcellulose (REFRESH PLUS) 0.5 % SOLN 1 drop 3 (three) times daily as needed.    . cetirizine (ZYRTEC) 10 MG tablet Take 10 mg by mouth as needed. Reported on 03/15/2016    . Cholecalciferol (VITAMIN D3) 2000 UNITS TABS Take by mouth daily.    Marland Kitchen EPINEPHrine (EPI-PEN) 0.3 mg/0.3 mL DEVI Inject 0.3 mLs (0.3 mg total) into the muscle once. 1 Device 0  . esomeprazole (NEXIUM) 40 MG capsule Take 40 mg by mouth as needed. Reported on 03/15/2016    . Flaxseed, Linseed, (GNP FLAX SEED OIL PO) Take 1 mL by mouth daily.    . fluticasone (FLONASE) 50 MCG/ACT nasal spray INSTILL 2 SPRAYS IN EACH NOSTRIL ONCE DAILY 48 g 3  . GuaiFENesin (MUCINEX PO) Take by mouth as needed. Reported on 03/15/2016    . hyoscyamine (LEVSIN SL) 0.125 MG SL tablet DISSOLVE 1 TABLET UNDER THE TONGUE EVERY 6 HOURS AS NEEDED FOR ABDOMINAL PAIN 385 tablet 0  . metoprolol tartrate (LOPRESSOR) 25 MG tablet TAKE ONE TABLET BY MOUTH TWICE DAILY **TAKE  ONE  EXTRA  TABLET  AS  NEEDED** 270 tablet 1  . montelukast (SINGULAIR) 10 MG tablet Take 1 tablet (10 mg total) by mouth daily. 90 tablet 1  .  Multiple  Minerals-Vitamins (CALCIUM CITRATE PLUS/MAGNESIUM PO) Take by mouth.    . nystatin cream (MYCOSTATIN) Apply 1 application topically 2 (two) times daily. 90 g 3  . Omega-3 Fatty Acids (FISH OIL PO) Take by mouth.    Marland Kitchen omeprazole (PRILOSEC) 40 MG capsule Take 1 capsule by mouth  daily 90 capsule 0  . ranitidine (ZANTAC) 150 MG tablet Take 150 mg by mouth at bedtime.     . sodium chloride (OCEAN) 0.65 % SOLN nasal spray Place 1 spray into both nostrils as needed for congestion.    . travoprost, benzalkonium, (TRAVATAN) 0.004 % ophthalmic solution Place 1 drop into both eyes at bedtime.    . triamcinolone cream (KENALOG) 0.1 % Apply 1 application topically 2 (two) times daily. 90 g 3   No current facility-administered medications on file prior to visit.    BP 128/78 mmHg  Pulse 76  Temp(Src) 98.8 F (37.1 C) (Oral)  Ht 5\' 2"  (1.575 m)  Wt 157 lb (71.215 kg)  BMI 28.71 kg/m2  SpO2 98%         Objective:   Physical Exam  General Mental Status- Alert. General Appearance- Not in acute distress.   Skin Rt ankle scattered mild red rash. But not warm or tender. Looks like raised mosquite bites.  Knees- 2 small red, warm and tender area.  Rt lower ext- no pedal edema. No calf swelling. Neg homan sign  Lt lower ext- lateral aspect calf. Faint dilated superificial vein but not tender. Neg homan sign. Calf not swolllen. Small red are proximal pretibial area that is tender.    Chest and Lung Exam Auscultation: Breath Sounds:-Normal.  Cardiovascular Auscultation:Rythm- Regular. Murmurs & Other Heart Sounds:Auscultation of the heart reveals- No Murmurs.  Abdomen Inspection:-Inspeection Normal. Palpation/Percussion:Note:No mass. Palpation and Percussion of the abdomen reveal- Non Tender, Non Distended + BS, no rebound or guarding.    Neurologic Cranial Nerve exam:- CN III-XII intact(No nystagmus), symmetric smile. Strength:- 5/5 equal and symmetric strength both upper and  lower extremities.      Assessment & Plan:  For allergic reaction to rt ankle possibly related to mosquito or ant bite will rx 4 day tap taper medrol  and low dose hydroxyzine.   Your red warm areas of patella and over left tibia area causes concern for skin infection. Will rx mupirocin. If rash over knees and areas persist/worsen by friday  then start augmentin.  Will get stat lower ext doppler left side today.  Follow up in 7 days or as needed  Chace Klippel, Percell Miller, [employer]

## 2016-03-28 NOTE — Patient Instructions (Addendum)
For allergic reaction to rt ankle possibly related to mosquito or ant bite will rx 4 day tap taper medrol  and low dose hydroxyzine.   Your red warm areas of patella and over left tibia area causes concern for skin infection. Will rx mupirocin. If rash over knees and areas persist/worsen by friday  then start augmentin.  Will get stat lower ext doppler left side today.  Follow up in 7 days or as needed

## 2016-03-29 NOTE — Progress Notes (Signed)
Quick Note:  Pt has seen results on MyChart and message also sent for patient to call back if any questions. ______ 

## 2016-03-30 ENCOUNTER — Telehealth: Payer: Self-pay | Admitting: Family Medicine

## 2016-03-30 ENCOUNTER — Encounter: Payer: Self-pay | Admitting: Family Medicine

## 2016-03-30 DIAGNOSIS — R21 Rash and other nonspecific skin eruption: Secondary | ICD-10-CM

## 2016-03-30 MED ORDER — TRIAMCINOLONE ACETONIDE 0.1 % EX CREA: 1.0000 "application " | TOPICAL_CREAM | Freq: Two times a day (BID) | CUTANEOUS | Status: DC

## 2016-03-30 NOTE — Telephone Encounter (Signed)
Please advise      KP 

## 2016-03-30 NOTE — Telephone Encounter (Signed)
Noted  

## 2016-03-30 NOTE — Telephone Encounter (Signed)
Patient Name: Kayla [name] DOB: October 15, 1945 Initial Comment Caller states they're having a medication reactions. Face is red and burning. She started Predistone yesterday. Nurse Assessment Nurse: Vallery Sa, RN, Cathy Date/Time (Eastern Time): 03/30/2016 10:50:09 AM Confirm and document reason for call. If symptomatic, describe symptoms. You must click the next button to save text entered. ---Lorna Few states she started Prednisone yesterday about 9am and she developed redness and burning of her face this morning about 9am. No severe breathing or swallowing difficulty. Alert and responsive. No fever. Has the patient traveled out of the country within the last 30 days? ---No Does the patient have any new or worsening symptoms? ---Yes Will a triage be completed? ---Yes Related visit to physician within the last 2 weeks? ---Yes Does the PT have any chronic conditions? (i.e. diabetes, asthma, etc.) ---Yes List chronic conditions. ---Rash on knees/ankles, Asthma (no wheezing or difficulty breathing), Allergies, high Blood Pressure Is this a behavioral health or substance abuse call? ---No Guidelines Guideline Title Affirmed Question Affirmed Notes Final Disposition User Clinical Call [place], RN, Tye [place] Comments Per [facility]-Prednisone: Major Side Effects If any of the following side effects occur while taking prednisone, check with your doctor immediately:flushed, dry skin No breathing or swallowing difficulty. Alert and responsive. No Hives or new rash. Called the office and Hoyle Sauer will speak with MD call Sedra back with further direction from MD regarding her medications. Hoyle Sauer states to have her not take additional doses of Prednisone until they call her back. Zannah updated and advised to call back with any new or worsening symptoms.

## 2016-03-30 NOTE — Telephone Encounter (Signed)
°  Reason for call: Patient called stating that she is having a reaction to Prednisone. States 20 minutes after taking it her face became red and hot. Transferred to [facility]. Spoke with Maudie Mercury.

## 2016-03-30 NOTE — Telephone Encounter (Signed)
See MyChart documentation from [place]. Pt states she had no reaction after first dose of prednisone, but 30 minutes after taking second dose this morning developed painful burning in her cheeks. 'Felt like my face was on fire.' This is resolving slowly. She no longer has facial pain, and reports her face looks 'mostly back to normal.' As far as the rash/infection on her leg, she reports it has improved considerably w/ mupirocin ointment. She has no drainage, redness, itching, or swelling. She will continue to use the mupirocin and will not start the augmentin unless her symptoms acutely worsen over the weekend. S/S of infection/indication to start atbx discussed w/ pt and she verbalized understanding. She is aware that she can use topical triamcinolone PRN for itching, but at this time she does not feel she needs it. She also asked about using fast-acting Claritin and Zantac for allergic reaction, as she had been advised to do this in the past. Advised pt that this combination can be helpful for acute-phase allergic reaction, but given that her symptoms have improved at this time and she is not experiencing symptoms of allergic reaction, there really is not an indication for antihistamine at this time. Pt verbalized understanding and agreement w/ plan. She will follow-up next week if symptoms have not improved or worsen.

## 2016-03-30 NOTE — Telephone Encounter (Signed)
Please handle this is you can--- they are sending me everyones stuff today I figured you saw her and maybe could answer this quicker

## 2016-03-30 NOTE — Telephone Encounter (Signed)
Spoke with pt and followed up with pt per E. Saguier and she states that she spoke with one of our [place] and she did not have any further questions at this time. Pt was appreciative of the call.

## 2016-03-30 NOTE — Telephone Encounter (Signed)
Please advise if any alternatives to prednisone and any additional recommendations for treatment of allergic reaction.

## 2016-03-30 NOTE — Telephone Encounter (Signed)
Prednisone sometimes can cause rosy red cheeks. I only wrote 4 days and low taper. I would stop the oral prednisone. How many days did she take before rash came on. Are the areas better. If rt ankle red bumps area still itching could rx kenalog cream for the rt ankle itching . Any other oral form prednisone could do same thing to her cheeks.

## 2016-04-03 ENCOUNTER — Encounter: Payer: Self-pay | Admitting: Medical

## 2016-04-04 ENCOUNTER — Encounter: Payer: Self-pay | Admitting: Medical

## 2016-04-04 ENCOUNTER — Ambulatory Visit (INDEPENDENT_AMBULATORY_CARE_PROVIDER_SITE_OTHER): Payer: Medicare Other | Admitting: Medical

## 2016-04-04 VITALS — BP 122/80 | HR 55 | Temp 98.6°F | Ht 62.0 in | Wt 156.6 lb

## 2016-04-04 DIAGNOSIS — T7840XD Allergy, unspecified, subsequent encounter: Secondary | ICD-10-CM | POA: Diagnosis not present

## 2016-04-04 DIAGNOSIS — L089 Local infection of the skin and subcutaneous tissue, unspecified: Secondary | ICD-10-CM | POA: Diagnosis not present

## 2016-04-04 NOTE — Patient Instructions (Addendum)
For your potential persisting allergic reaction to skin rt lower ext would advise resuming the very low dose tapered medrol. You may had had somewhat common side effects of rosy cheeks that were warm at higher dose. But with lower dose I think you may not have this effect. If you take medrol  and have any worse symptoms then ED evaluation.  For other areas that appear to be infection continue mupirocin and augmentin.   Follow up in 3-5 days or as needed.  If areas are not improved completely then will refer to dermatologist.

## 2016-04-04 NOTE — Progress Notes (Signed)
   Subjective:    Patient ID: Kayla [name], female    DOB: 01-18-1946, 70 y.o.   MRN: [id number]  HPI   Pt in for follow up. See last note. I thought she had allergic reaction to rt ankle. I had written prednisone this taper dose. But when she took the prednisone and her cheeks got very bright red and burned. She called and reported this and we advised to stop the prednisone(same day she improved). Pt has some more prednisone left. She would be on 3rd day of taper dose if she restarted.  On patient lt leg I thought maybe early skin infection. So I rx'd mupirocin. She did well with this. But pt states around the time stopped prednisone the areas on her left knee and pretibial area got tender. Pt did start augmentin last night at 6:45. She has allergy history to various antibiotic and had expressed on last visit she can take augmentin.     Review of Systems  Constitutional: Negative for chills, fatigue and fever.  HENT: Negative for congestion and ear pain.   Respiratory: Negative for cough, chest tightness, shortness of breath and wheezing.   Cardiovascular: Negative for chest pain and palpitations.  Gastrointestinal: Negative for abdominal pain.  Musculoskeletal: Negative for back pain, joint swelling and neck pain.  Skin: Positive for rash.       See hpi.        Objective:   Physical Exam  General Mental Status- Alert. General Appearance- Not in acute distress.   Skin Rt ankle scattered mild red rash. But not warm or tender. Looks like raised mosquite bites.  Knees- 2 small red, warm and tender area.  Rt lower ext- no pedal edema. No calf swelling. Neg homan sign  Lt lower ext- lateral aspect calf. Faint dilated superificial vein but not tender. Neg homan sign. Calf not swolllen. Small red are proximal pretibial area that is tender.    Chest and Lung Exam Auscultation: Breath Sounds:-Normal.  Cardiovascular Auscultation:Rythm- Regular. Murmurs & Other  Heart Sounds:Auscultation of the heart reveals- No Murmurs.    Neurologic Cranial Nerve exam:- CN III-XII intact(No nystagmus), symmetric smile. Strength:- 5/5 equal and symmetric strength both upper and lower extremities.      Assessment & Plan:  For your potential persisting allergic reaction to skin rt lower ext would advise resuming the very low dose tapered medrol. You may had had somewhat common side effects of rosy cheeks that were warm at higher dose. But with lower dose I think you may not have this effect. If you take medrol  and have any worse symptoms then ED evaluation.  For other areas that appear to be infection continue mupirocin and augmentin.   Follow up in 3-5 days or as needed.  If areas are not improved completely then will refer to dermatologist.  Mackie Pai, PA-C

## 2016-04-04 NOTE — Progress Notes (Signed)
Pre visit review using our clinic review tool, if applicable. No additional management support is needed unless otherwise documented below in the visit note. 

## 2016-04-05 ENCOUNTER — Telehealth: Payer: Self-pay | Admitting: Medical

## 2016-04-05 ENCOUNTER — Telehealth: Payer: Self-pay | Admitting: Family Medicine

## 2016-04-05 ENCOUNTER — Encounter: Payer: Self-pay | Admitting: Medical

## 2016-04-05 ENCOUNTER — Other Ambulatory Visit: Payer: Self-pay | Admitting: *Deleted

## 2016-04-05 DIAGNOSIS — T7840XD Allergy, unspecified, subsequent encounter: Secondary | ICD-10-CM

## 2016-04-05 MED ORDER — METHYLPREDNISOLONE 4 MG PO TABS: 4.0000 mg | ORAL_TABLET | Freq: Every day | ORAL | 0 refills | Status: DC

## 2016-04-05 NOTE — Telephone Encounter (Signed)
Dr Nevada Crane can see her on 04/11/16 @12 , this ok?

## 2016-04-05 NOTE — Telephone Encounter (Signed)
Call pt and let her know I sent in 4 more tabs of 4 mg medrol. Take 1 tab a day. Should be adequate until she sees derm.

## 2016-04-05 NOTE — Telephone Encounter (Signed)
Caller name: Loyda Relation to pt: self Call back number: [phone] Pharmacy:  Reason for call: Pt called stating was seen yesterday for her legs (issue swollen and ? Rash), but pt informed if she does not get any better to call back to be referred to a Dermatologist, pt would like to be referred if possible today to Dr. Allyson Sabal Tel [phone] or Dr Allyn Kenner tel [phone] since her right leg is worst then yesterday and left leg is still swollen, pt states she is really worried. Please advise ASAP.

## 2016-04-05 NOTE — Telephone Encounter (Signed)
Did you see derm info pt gave [place]. Can you try those and see if can be seen tomorrow or Monday?

## 2016-04-05 NOTE — Telephone Encounter (Signed)
Patient is ok with appointment. Requesting to extend prednisone. Doing fine on it since starting back yesterday. Please advise

## 2016-04-05 NOTE — Telephone Encounter (Signed)
Referral placed per below to allow referral coordinator to begin referral process.

## 2016-04-05 NOTE — Telephone Encounter (Signed)
Pt gave dermatolgist names that she would like [place] to try to refer asap. Will you look at her note and try to call them today. Let me know what they say?

## 2016-04-05 NOTE — Telephone Encounter (Signed)
error 

## 2016-04-05 NOTE — Telephone Encounter (Signed)
Please advise on referral. Per Delsa Sale the Dermatology appointments are 1-2 months out.

## 2016-04-06 NOTE — Telephone Encounter (Signed)
Spoke with pt and advised her of the extension of prednisone for 4 days per verbal order from E. Saguier. Pt did not have any further questions.

## 2016-04-06 NOTE — Telephone Encounter (Signed)
Spoke with pt and she states that she is going to picking up the prednisone today.  Pt states that she still notices some redness but she states that the area is some better and she will take the ABT with the prednisone and the Mupirocin ointment that was given at the last visit. Pt was also advised that if she had any new symptoms or noticed the areas getting worse to go to the ER over the weekend. Pt voices understanding and did not have any further questions.

## 2016-04-10 DIAGNOSIS — I8000 Phlebitis and thrombophlebitis of superficial vessels of unspecified lower extremity: Secondary | ICD-10-CM | POA: Diagnosis not present

## 2016-04-24 ENCOUNTER — Other Ambulatory Visit: Payer: Self-pay | Admitting: Family Medicine

## 2016-04-24 DIAGNOSIS — I8000 Phlebitis and thrombophlebitis of superficial vessels of unspecified lower extremity: Secondary | ICD-10-CM | POA: Diagnosis not present

## 2016-05-22 DIAGNOSIS — I8000 Phlebitis and thrombophlebitis of superficial vessels of unspecified lower extremity: Secondary | ICD-10-CM | POA: Diagnosis not present

## 2016-05-22 DIAGNOSIS — L52 Erythema nodosum: Secondary | ICD-10-CM | POA: Diagnosis not present

## 2016-05-24 ENCOUNTER — Telehealth: Payer: Self-pay | Admitting: Family Medicine

## 2016-05-24 ENCOUNTER — Ambulatory Visit (INDEPENDENT_AMBULATORY_CARE_PROVIDER_SITE_OTHER): Payer: Medicare Other | Admitting: Family Medicine

## 2016-05-24 ENCOUNTER — Ambulatory Visit (HOSPITAL_BASED_OUTPATIENT_CLINIC_OR_DEPARTMENT_OTHER)
Admission: RE | Admit: 2016-05-24 | Discharge: 2016-05-24 | Disposition: A | Discharge status: Home / Self Care | Payer: Medicare Other | Source: Ambulatory Visit | Attending: Family Medicine | Admitting: Family Medicine

## 2016-05-24 ENCOUNTER — Encounter: Payer: Self-pay | Admitting: Family Medicine

## 2016-05-24 ENCOUNTER — Ambulatory Visit: Payer: Medicare Other | Admitting: Family Medicine

## 2016-05-24 VITALS — BP 141/61 | HR 56 | Temp 98.5°F | Resp 16 | Ht 62.0 in | Wt 160.4 lb

## 2016-05-24 DIAGNOSIS — Z23 Encounter for immunization: Secondary | ICD-10-CM | POA: Diagnosis not present

## 2016-05-24 DIAGNOSIS — L52 Erythema nodosum: Secondary | ICD-10-CM

## 2016-05-24 DIAGNOSIS — R05 Cough: Secondary | ICD-10-CM | POA: Diagnosis not present

## 2016-05-24 LAB — COMPREHENSIVE METABOLIC PANEL
ALT: 22 U/L (ref 0–35)
AST: 26 U/L (ref 0–37)
Albumin: 4.3 g/dL (ref 3.5–5.2)
Alkaline Phosphatase: 78 U/L (ref 39–117)
BUN: 17 mg/dL (ref 6–23)
CHLORIDE: 102 meq/L (ref 96–112)
CO2: 31 mEq/L (ref 19–32)
Calcium: 9.5 mg/dL (ref 8.4–10.5)
Creatinine, Ser: 0.69 mg/dL (ref 0.40–1.20)
GFR: 89.22 mL/min (ref >60.00)
GLUCOSE: 81 mg/dL (ref 70–99)
POTASSIUM: 4.2 meq/L (ref 3.5–5.1)
SODIUM: 137 meq/L (ref 135–145)
TOTAL PROTEIN: 7.1 g/dL (ref 6.0–8.3)
Total Bilirubin: 0.4 mg/dL (ref 0.2–1.2)

## 2016-05-24 LAB — CBC WITH DIFFERENTIAL/PLATELET
Basophils Absolute: 0 10*3/uL (ref 0.0–0.1)
Basophils Relative: 0.7 % (ref 0.0–3.0)
EOS PCT: 2.3 % (ref 0.0–5.0)
Eosinophils Absolute: 0.1 10*3/uL (ref 0.0–0.7)
HCT: 38.7 % (ref 36.0–46.0)
Hemoglobin: 13.2 g/dL (ref 12.0–15.0)
LYMPHS ABS: 1 10*3/uL (ref 0.7–4.0)
Lymphocytes Relative: 23 % (ref 12.0–46.0)
MCHC: 34.1 g/dL (ref 30.0–36.0)
MCV: 90.3 fl (ref 78.0–100.0)
MONO ABS: 0.5 10*3/uL (ref 0.1–1.0)
Monocytes Relative: 11.4 % (ref 3.0–12.0)
NEUTROS PCT: 62.6 % (ref 43.0–77.0)
Neutro Abs: 2.7 10*3/uL (ref 1.4–7.7)
Platelets: 167 10*3/uL (ref 150.0–400.0)
RBC: 4.28 Mil/uL (ref 3.87–5.11)
RDW: 14.1 % (ref 11.5–15.5)
WBC: 4.3 10*3/uL (ref 4.0–10.5)

## 2016-05-24 LAB — SEDIMENTATION RATE: Sed Rate: 23 mm/hr (ref 0–30)

## 2016-05-24 MED ORDER — COLCHICINE 0.6 MG PO TABS: 0.6000 mg | ORAL_TABLET | Freq: Two times a day (BID) | ORAL | 0 refills | Status: DC

## 2016-05-24 NOTE — Telephone Encounter (Signed)
Rx re-faxed to [facility].   KP

## 2016-05-24 NOTE — Telephone Encounter (Signed)
Pt says that Rx sent in today (colchicine) should have gone to the [facility] on [address] in [place] instead of mail order. Pt would like to have prescription changed.

## 2016-05-24 NOTE — Patient Instructions (Signed)
Check labs Check chest xray today We will consider rheumatology once everything comes back

## 2016-05-24 NOTE — Progress Notes (Signed)
Pre visit review using our clinic review tool, if applicable. No additional management support is needed unless otherwise documented below in the visit note. 

## 2016-05-24 NOTE — Progress Notes (Signed)
Patient ID: Kayla [name], female    DOB: 1946-01-27  Age: 70 y.o. MRN: [id number]    Subjective:  Subjective  HPI [name] presents for f/u errythema nodosum   Review of Systems  Constitutional: Negative for appetite change, diaphoresis, fatigue and unexpected weight change.  Eyes: Negative for pain, redness and visual disturbance.  Respiratory: Negative for cough, chest tightness, shortness of breath and wheezing.   Cardiovascular: Negative for chest pain, palpitations and leg swelling.  Endocrine: Negative for cold intolerance, heat intolerance, polydipsia, polyphagia and polyuria.  Genitourinary: Negative for difficulty urinating, dysuria and frequency.  Neurological: Negative for dizziness, light-headedness, numbness and headaches.    History Past Medical History:  Diagnosis Date  . Asthma   . CAP (community acquired pneumonia) 08/2012   Dr Birdie Riddle  . Diverticulitis   . Diverticulosis   . Fasting hyperglycemia 04/2011   FBS 108  . Fibromyalgia   . Fundic gland polyps of stomach, benign   . GERD (gastroesophageal reflux disease)    gastric polyp x3  . Glaucoma     Dr Bing Plume  . IBS (irritable bowel syndrome)   . Osteopenia    last 07/2011  . Pneumonia     OP as child  . Shingles     She has a past surgical history that includes Spine surgery; Colonoscopy w/ polypectomy ([id number]); and Esophagogastroduodenoscopy.   Her family history includes Asthma in her paternal aunt; Breast cancer in her maternal aunt, mother, paternal aunt, and sister; Coronary artery disease in her paternal aunt; Deep vein thrombosis in her son; Heart attack in her paternal grandmother and paternal uncle; Heart disease in her maternal aunt and paternal grandfather; Heart failure in her father; Kidney disease in her maternal grandmother; Pulmonary embolism in her son.She reports that she quit smoking about 41 years ago. Her smoking use included Cigarettes. She has a 5.00 pack-year  smoking history. She has never used smokeless tobacco. She reports that she does not drink alcohol or use drugs.  Current Outpatient Prescriptions on File Prior to Visit  Medication Sig Dispense Refill  . acetaminophen (TYLENOL) 500 MG tablet Take 500 mg by mouth as needed.    Marland Kitchen albuterol (PROVENTIL) (2.5 MG/3ML) 0.083% nebulizer solution Take 2.5 mg by nebulization every 6 (six) hours as needed. Reported on 03/15/2016    . amoxicillin-clavulanate (AUGMENTIN) 875-125 MG tablet Take 1 tablet by mouth 2 (two) times daily. 20 tablet 0  . bifidobacterium infantis (ALIGN) capsule Take 1 capsule by mouth daily. 21 capsule 0  . BLACK ELDERBERRY,BERRY-FLOWER, PO Take by mouth. Reported on 03/15/2016    . cetirizine (ZYRTEC) 10 MG tablet Take 10 mg by mouth as needed. Reported on 03/15/2016    . Cholecalciferol (VITAMIN D3) 2000 UNITS TABS Take by mouth daily.    Marland Kitchen EPINEPHrine (EPI-PEN) 0.3 mg/0.3 mL DEVI Inject 0.3 mLs (0.3 mg total) into the muscle once. 1 Device 0  . esomeprazole (NEXIUM) 40 MG capsule Take 40 mg by mouth as needed. Reported on 03/15/2016    . Flaxseed, Linseed, (GNP FLAX SEED OIL PO) Take 1 mL by mouth daily.    . fluticasone (FLONASE) 50 MCG/ACT nasal spray INSTILL 2 SPRAYS IN EACH NOSTRIL ONCE DAILY 48 g 3  . GuaiFENesin (MUCINEX PO) Take by mouth as needed. Reported on 03/15/2016    . hyoscyamine (LEVSIN SL) 0.125 MG SL tablet DISSOLVE 1 TABLET UNDER THE TONGUE EVERY 6 HOURS AS NEEDED FOR ABDOMINAL PAIN 385 tablet 0  . metoprolol tartrate (  LOPRESSOR) 25 MG tablet TAKE ONE TABLET BY MOUTH TWICE DAILY **TAKE  ONE  EXTRA  TABLET  AS  NEEDED** 270 tablet 1  . montelukast (SINGULAIR) 10 MG tablet Take 1 tablet by mouth  daily 90 tablet 3  . Multiple Minerals-Vitamins (CALCIUM CITRATE PLUS/MAGNESIUM PO) Take by mouth.    . nystatin cream (MYCOSTATIN) Apply 1 application topically 2 (two) times daily. 90 g 3  . Omega-3 Fatty Acids (FISH OIL PO) Take by mouth.    Marland Kitchen omeprazole (PRILOSEC) 40 MG  capsule Take 1 capsule by mouth  daily 90 capsule 0  . ranitidine (ZANTAC) 150 MG tablet Take 150 mg by mouth at bedtime.     . sodium chloride (OCEAN) 0.65 % SOLN nasal spray Place 1 spray into both nostrils as needed for congestion.    . travoprost, benzalkonium, (TRAVATAN) 0.004 % ophthalmic solution Place 1 drop into both eyes at bedtime.    . triamcinolone cream (KENALOG) 0.1 % Apply 1 application topically 2 (two) times daily. 90 g 3  . albuterol (PROVENTIL HFA;VENTOLIN HFA) 108 (90 BASE) MCG/ACT inhaler Inhale 2 puffs into the lungs every 6 (six) hours as needed for wheezing or shortness of breath. As needed only (Patient not taking: Reported on 05/24/2016) 1 Inhaler 1  . carboxymethylcellulose (REFRESH PLUS) 0.5 % SOLN 1 drop 3 (three) times daily as needed.    . hydrOXYzine (ATARAX/VISTARIL) 10 MG tablet 1 tab po q hs as needed itching (Patient not taking: Reported on 05/24/2016) 7 tablet 0  . mupirocin ointment (BACTROBAN) 2 % Apply to red areas and warm area twice daily (Patient not taking: Reported on 05/24/2016) 22 g 0   No current facility-administered medications on file prior to visit.      Objective:  Objective  Physical Exam  Constitutional: She is oriented to person, place, and time. She appears well-developed and well-nourished.  HENT:  Head: Normocephalic and atraumatic.  Eyes: Conjunctivae and EOM are normal.  Neck: Normal range of motion. Neck supple. No JVD present. Carotid bruit is not present. No thyromegaly present.  Cardiovascular: Normal rate, regular rhythm and normal heart sounds.   No murmur heard. Pulmonary/Chest: Effort normal and breath sounds normal. No respiratory distress. She has no wheezes. She has no rales. She exhibits no tenderness.  Musculoskeletal: She exhibits no edema.  Neurological: She is alert and oriented to person, place, and time.  Skin: There is erythema.     Psychiatric: She has a normal mood and affect. Her behavior is normal. Thought  content normal.  Nursing note and vitals reviewed.  BP (!) 141/61 (BP Location: Left Arm, Patient Position: Sitting)   Pulse (!) 56   Temp 98.5 F (36.9 C) (Oral)   Resp 16   Ht _0  (1.575 m)   Wt 160 lb 6.4 oz (72.8 kg)   SpO2 99%   BMI 29.34 kg/m  Wt Readings from Last 3 Encounters:  05/24/16 160 lb 6.4 oz (72.8 kg)  04/04/16 156 lb 9.6 oz (71 kg)  03/28/16 157 lb (71.2 kg)     Lab Results  Component Value Date   WBC 4.3 05/24/2016   HGB 13.2 05/24/2016   HCT 38.7 05/24/2016   PLT 167.0 05/24/2016   GLUCOSE 81 05/24/2016   CHOL 215 (H) 02/27/2016   TRIG 120.0 02/27/2016   HDL 60.30 02/27/2016   LDLDIRECT 153.3 12/18/2012   LDLCALC 130 (H) 02/27/2016   ALT 22 05/24/2016   AST 26 05/24/2016   NA 137  05/24/2016   K 4.2 05/24/2016   CL 102 05/24/2016   CREATININE 0.69 05/24/2016   BUN 17 05/24/2016   CO2 31 05/24/2016   TSH 1.22 12/18/2012   INR 0.97 12/15/2009   HGBA1C 5.4 11/01/2011    US Venous Img Lower Unilateral Left  Result Date: 03/28/2016 CLINICAL DATA:  Left leg pain for 1 week EXAM: LEFT LOWER EXTREMITY VENOUS DUPLEX ULTRASOUND TECHNIQUE: Doppler venous assessment of the left lower extremity deep venous system was performed, including characterization of spectral flow, compressibility, and phasicity. COMPARISON:  None. FINDINGS: There is complete compressibility of the left common femoral, femoral, and popliteal veins. Doppler analysis demonstrates respiratory phasicity and augmentation of flow with calf compression. No obvious superficial vein or calf vein thrombosis. IMPRESSION: No evidence of DVT. Electronically Signed   By: Marybelle Killings M.D.   On: 03/28/2016 14:54     Assessment & Plan:  Plan  I have discontinued Ms. Penaflor's methylPREDNISolone. I am also having her maintain her GuaiFENesin (MUCINEX PO), EPINEPHrine, ranitidine, acetaminophen, Vitamin D3, cetirizine, albuterol, esomeprazole, bifidobacterium infantis, travoprost (benzalkonium),  (Flaxseed, Linseed, (GNP FLAX SEED OIL PO)), Omega-3 Fatty Acids (FISH OIL PO), Multiple Minerals-Vitamins (CALCIUM CITRATE PLUS/MAGNESIUM PO), nystatin cream, (BLACK ELDERBERRY,BERRY-FLOWER, PO), sodium chloride, albuterol, fluticasone, metoprolol tartrate, hyoscyamine, omeprazole, carboxymethylcellulose, hydrOXYzine, mupirocin ointment, amoxicillin-clavulanate, triamcinolone cream, and montelukast.  Meds ordered this encounter  Medications  . DISCONTD: colchicine 0.6 MG tablet    Sig: Take 1 tablet (0.6 mg total) by mouth 2 (two) times daily. 1 po bid x 2-3 days then take 1 a day    Dispense:  30 tablet    Refill:  0    Problem List Items Addressed This Visit    None    Visit Diagnoses    Erythema nodosum    -  Primary   Relevant Orders   CBC with Differential/Platelet (Completed)   Comprehensive metabolic panel (Completed)   Epstein-Barr virus VCA antibody panel   Hepatitis B Surface AntiBODY   DG Chest 2 View (Completed)   Sed Rate (ESR) (Completed)   ANCA Screen Reflex Titer   Rheumatoid factor   ANA    finish abx Check labs Consider rheum referral  Follow-up: Return in about 2 weeks (around 06/07/2016).  [name], DO

## 2016-05-25 ENCOUNTER — Ambulatory Visit: Payer: Medicare Other | Admitting: Family Medicine

## 2016-05-25 LAB — EPSTEIN-BARR VIRUS VCA ANTIBODY PANEL
EBV NA IGG: 446 U/mL — AB
EBV VCA IgG: 277 U/mL — ABNORMAL HIGH
EBV VCA IgM: <36 U/mL

## 2016-05-25 LAB — ANCA SCREEN W REFLEX TITER: ANCA SCREEN: NEGATIVE

## 2016-05-25 LAB — ANA: Anti Nuclear Antibody(ANA): POSITIVE — AB

## 2016-05-25 LAB — ANTI-NUCLEAR AB-TITER (ANA TITER): ANA Titer 1: 1:160 {titer} — ABNORMAL HIGH

## 2016-05-25 LAB — RHEUMATOID FACTOR: Rhuematoid fact SerPl-aCnc: <14 IU/mL (ref <14)

## 2016-05-25 LAB — HEPATITIS B SURFACE ANTIBODY,QUALITATIVE: HEP B S AB: NEGATIVE

## 2016-05-29 ENCOUNTER — Encounter: Payer: Self-pay | Admitting: Family Medicine

## 2016-05-29 ENCOUNTER — Other Ambulatory Visit: Payer: Self-pay | Admitting: Family Medicine

## 2016-05-29 ENCOUNTER — Other Ambulatory Visit: Payer: Self-pay

## 2016-05-29 DIAGNOSIS — R768 Other specified abnormal immunological findings in serum: Secondary | ICD-10-CM

## 2016-05-29 NOTE — Progress Notes (Signed)
rh

## 2016-06-07 ENCOUNTER — Encounter: Payer: Self-pay | Admitting: Family Medicine

## 2016-06-07 ENCOUNTER — Ambulatory Visit (INDEPENDENT_AMBULATORY_CARE_PROVIDER_SITE_OTHER): Payer: Medicare Other | Admitting: Family Medicine

## 2016-06-07 DIAGNOSIS — L52 Erythema nodosum: Secondary | ICD-10-CM | POA: Diagnosis not present

## 2016-06-07 NOTE — Patient Instructions (Signed)
Muscle Pain, Adult  Muscle pain (myalgia) may be caused by many things, including:  · Overuse or muscle strain, especially if you are not in shape. This is the most common cause of muscle pain.  · Injury.  · Bruises.  · Viruses, such as the flu.  · Infectious diseases.  · Fibromyalgia, which is a chronic condition that causes muscle tenderness, fatigue, and headache.  · Autoimmune diseases, including lupus.  · Certain drugs, including ACE inhibitors and statins.  Muscle pain may be mild or severe. In most cases, the pain lasts only a short time and goes away without treatment. To diagnose the cause of your muscle pain, your health care provider will take your medical history. This means he or she will ask you when your muscle pain began and what has been happening. If you have not had muscle pain for very long, your health care provider may want to wait before doing much testing. If your muscle pain has lasted a long time, your health care provider may want to run tests right away. If your health care provider thinks your muscle pain may be caused by illness, you may need to have additional tests to rule out certain conditions.   Treatment for muscle pain depends on the cause. Home care is often enough to relieve muscle pain. Your health care provider may also prescribe anti-inflammatory medicine.  HOME CARE INSTRUCTIONS  Watch your condition for any changes. The following actions may help to lessen any discomfort you are feeling:  · Only take over-the-counter or prescription medicines as directed by your health care provider.  · Apply ice to the sore muscle:    Put ice in a plastic bag.    Place a towel between your skin and the bag.    Leave the ice on for 15-20 minutes, 3-4 times a day.  · You may alternate applying hot and cold packs to the muscle as directed by your health care provider.  · If overuse is causing your muscle pain, slow down your activities until the pain goes away.    Remember that it is normal  to feel some muscle pain after starting a workout program. Muscles that have not been used often will be sore at first.    Do regular, gentle exercises if you are not usually active.    Warm up before exercising to lower your risk of muscle pain.  · Do not continue working out if the pain is very bad. Bad pain could mean you have injured a muscle.  SEEK MEDICAL CARE IF:  · Your muscle pain gets worse, and medicines do not help.  · You have muscle pain that lasts longer than 3 days.  · You have a rash or fever along with muscle pain.  · You have muscle pain after a tick bite.  · You have muscle pain while working out, even though you are in good physical condition.  · You have redness, soreness, or swelling along with muscle pain.  · You have muscle pain after starting a new medicine or changing the dose of a medicine.  SEEK IMMEDIATE MEDICAL CARE IF:  · You have trouble breathing.  · You have trouble swallowing.  · You have muscle pain along with a stiff neck, fever, and vomiting.  · You have severe muscle weakness or cannot move part of your body.  MAKE SURE YOU:   · Understand these instructions.  · Will watch your condition.  · Will get   help right away if you are not doing well or get worse.     This information is not intended to replace advice given to you by your health care provider. Make sure you discuss any questions you have with your health care provider.     Document Released: 07/19/2006 Document Revised: 09/17/2014 Document Reviewed: 06/23/2013  Elsevier Interactive Patient Education ©2016 [employer]

## 2016-06-07 NOTE — Progress Notes (Signed)
Pre visit review using our clinic review tool, if applicable. No additional management support is needed unless otherwise documented below in the visit note. 

## 2016-06-07 NOTE — Progress Notes (Signed)
Patient ID: Kayla [name], female    DOB: Jul 10, 1946  Age: 70 y.o. MRN: [id number]    Subjective:  Subjective  HPI Kayla [name] presents for f/u erythema nodosum--- symptoms are much better.  She has been wearing compression socks.  Appointment with rhem is in Nov.  Review of Systems  Constitutional: Negative for appetite change, diaphoresis, fatigue and unexpected weight change.  Eyes: Negative for pain, redness and visual disturbance.  Respiratory: Negative for cough, chest tightness, shortness of breath and wheezing.   Cardiovascular: Negative for chest pain, palpitations and leg swelling.  Endocrine: Negative for cold intolerance, heat intolerance, polydipsia, polyphagia and polyuria.  Genitourinary: Negative for difficulty urinating, dysuria and frequency.  Musculoskeletal: Positive for arthralgias and myalgias.  Neurological: Negative for dizziness, light-headedness, numbness and headaches.    History Past Medical History:  Diagnosis Date  . Asthma   . CAP (community acquired pneumonia) 08/2012   Dr Birdie Riddle  . Diverticulitis   . Diverticulosis   . Fasting hyperglycemia 04/2011   FBS 108  . Fibromyalgia   . Fundic gland polyps of stomach, benign   . GERD (gastroesophageal reflux disease)    gastric polyp x3  . Glaucoma     Dr Bing Plume  . IBS (irritable bowel syndrome)   . Osteopenia    last 07/2011  . Pneumonia     OP as child  . Shingles     She has a past surgical history that includes Spine surgery; Colonoscopy w/ polypectomy [id number]); and Esophagogastroduodenoscopy.   Her family history includes Asthma in her paternal aunt; Breast cancer in her maternal aunt, mother, paternal aunt, and sister; Coronary artery disease in her paternal aunt; Deep vein thrombosis in her son; Heart attack in her paternal grandmother and paternal uncle; Heart disease in her maternal aunt and paternal grandfather; Heart failure in her father; Kidney disease in her maternal  grandmother; Pulmonary embolism in her son.She reports that she quit smoking about 41 years ago. Her smoking use included Cigarettes. She has a 5.00 pack-year smoking history. She has never used smokeless tobacco. She reports that she does not drink alcohol or use drugs.  Current Outpatient Prescriptions on File Prior to Visit  Medication Sig Dispense Refill  . acetaminophen (TYLENOL) 500 MG tablet Take 500 mg by mouth as needed.    . bifidobacterium infantis (ALIGN) capsule Take 1 capsule by mouth daily. 21 capsule 0  . BLACK ELDERBERRY,BERRY-FLOWER, PO Take by mouth. Reported on 03/15/2016    . carboxymethylcellulose (REFRESH PLUS) 0.5 % SOLN 1 drop 3 (three) times daily as needed.    . cetirizine (ZYRTEC) 10 MG tablet Take 10 mg by mouth as needed. Reported on 03/15/2016    . Cholecalciferol (VITAMIN D3) 2000 UNITS TABS Take by mouth daily.    . colchicine 0.6 MG tablet Take 1 tablet (0.6 mg total) by mouth 2 (two) times daily. 1 po bid x 2-3 days then take 1 a day 30 tablet 0  . EPINEPHrine (EPI-PEN) 0.3 mg/0.3 mL DEVI Inject 0.3 mLs (0.3 mg total) into the muscle once. 1 Device 0  . esomeprazole (NEXIUM) 40 MG capsule Take 40 mg by mouth as needed. Reported on 03/15/2016    . Flaxseed, Linseed, (GNP FLAX SEED OIL PO) Take 1 mL by mouth daily.    . fluticasone (FLONASE) 50 MCG/ACT nasal spray INSTILL 2 SPRAYS IN EACH NOSTRIL ONCE DAILY 48 g 3  . GuaiFENesin (MUCINEX PO) Take by mouth as needed. Reported on 03/15/2016    .  hydrOXYzine (ATARAX/VISTARIL) 10 MG tablet 1 tab po q hs as needed itching 7 tablet 0  . hyoscyamine (LEVSIN SL) 0.125 MG SL tablet DISSOLVE 1 TABLET UNDER THE TONGUE EVERY 6 HOURS AS NEEDED FOR ABDOMINAL PAIN 385 tablet 0  . metoprolol tartrate (LOPRESSOR) 25 MG tablet TAKE ONE TABLET BY MOUTH TWICE DAILY **TAKE  ONE  EXTRA  TABLET  AS  NEEDED** 270 tablet 1  . montelukast (SINGULAIR) 10 MG tablet Take 1 tablet by mouth  daily 90 tablet 3  . Multiple Minerals-Vitamins (CALCIUM  CITRATE PLUS/MAGNESIUM PO) Take by mouth.    . mupirocin ointment (BACTROBAN) 2 % Apply to red areas and warm area twice daily 22 g 0  . nystatin cream (MYCOSTATIN) Apply 1 application topically 2 (two) times daily. 90 g 3  . Omega-3 Fatty Acids (FISH OIL PO) Take by mouth.    Marland Kitchen omeprazole (PRILOSEC) 40 MG capsule Take 1 capsule by mouth  daily 90 capsule 0  . ranitidine (ZANTAC) 150 MG tablet Take 150 mg by mouth at bedtime.     . sodium chloride (OCEAN) 0.65 % SOLN nasal spray Place 1 spray into both nostrils as needed for congestion.    . travoprost, benzalkonium, (TRAVATAN) 0.004 % ophthalmic solution Place 1 drop into both eyes at bedtime.    . triamcinolone cream (KENALOG) 0.1 % Apply 1 application topically 2 (two) times daily. 90 g 3  . albuterol (PROVENTIL HFA;VENTOLIN HFA) 108 (90 BASE) MCG/ACT inhaler Inhale 2 puffs into the lungs every 6 (six) hours as needed for wheezing or shortness of breath. As needed only (Patient not taking: Reported on 06/07/2016) 1 Inhaler 1   No current facility-administered medications on file prior to visit.      Objective:  Objective  Physical Exam  Constitutional: She is oriented to person, place, and time. She appears well-developed and well-nourished.  HENT:  Head: Normocephalic and atraumatic.  Eyes: Conjunctivae and EOM are normal.  Neck: Normal range of motion. Neck supple. No JVD present. Carotid bruit is not present. No thyromegaly present.  Cardiovascular: Normal rate, regular rhythm and normal heart sounds.   No murmur heard. Pulmonary/Chest: Effort normal and breath sounds normal. No respiratory distress. She has no wheezes. She has no rales. She exhibits no tenderness.  Musculoskeletal: She exhibits no edema.  Neurological: She is alert and oriented to person, place, and time.  Psychiatric: She has a normal mood and affect. Her behavior is normal. Judgment and thought content normal.  Nursing note and vitals reviewed.  BP (!) 147/78  (BP Location: Left Arm, Patient Position: Sitting, Cuff Size: Normal)   Pulse (!) 57   Temp 99.4 F (37.4 C) (Oral)   Resp 16   Ht 5\' 2"  (1.575 m)   Wt 162 lb (73.5 kg)   SpO2 98%   BMI 29.63 kg/m  Wt Readings from Last 3 Encounters:  06/07/16 162 lb (73.5 kg)  05/24/16 160 lb 6.4 oz (72.8 kg)  04/04/16 156 lb 9.6 oz (71 kg)     Lab Results  Component Value Date   WBC 4.3 05/24/2016   HGB 13.2 05/24/2016   HCT 38.7 05/24/2016   PLT 167.0 05/24/2016   GLUCOSE 81 05/24/2016   CHOL 215 (H) 02/27/2016   TRIG 120.0 02/27/2016   HDL 60.30 02/27/2016   LDLDIRECT 153.3 12/18/2012   LDLCALC 130 (H) 02/27/2016   ALT 22 05/24/2016   AST 26 05/24/2016   NA 137 05/24/2016   K 4.2 05/24/2016  CL 102 05/24/2016   CREATININE 0.69 05/24/2016   BUN 17 05/24/2016   CO2 31 05/24/2016   TSH 1.22 12/18/2012   INR 0.97 12/15/2009   HGBA1C 5.4 11/01/2011    Dg Chest 2 View  Result Date: 05/24/2016 CLINICAL DATA:  70 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 Intermittent cough. Former smoker. Initial encounter. EXAM: CHEST  2 VIEW COMPARISON:  05/23/2011 and earlier. FINDINGS: Stable chronic thoracolumbar scoliosis with stable appearance of mid thoracic to lumbar posterior spinal fusion hardware. Osteopenia with chronic mid thoracic disc and endplate degeneration. Lung volumes are stable at the upper limits of normal. Mild chronic increased interstitial markings appear stable to mildly regressed since 2012. Normal cardiac size and mediastinal contours. Visualized tracheal air column is within normal limits. No pneumothorax, pulmonary edema, pleural effusion or confluent pulmonary opacity. Negative visible bowel gas pattern. IMPRESSION: No acute cardiopulmonary abnormality. Electronically Signed   By: Genevie Ann M.D.   On: 05/24/2016 16:07     Assessment & Plan:  Plan  I have discontinued Ms. Molesky's amoxicillin-clavulanate. I am also having her maintain her GuaiFENesin (MUCINEX PO), EPINEPHrine,  ranitidine, acetaminophen, Vitamin D3, cetirizine, esomeprazole, bifidobacterium infantis, travoprost (benzalkonium), (Flaxseed, Linseed, (GNP FLAX SEED OIL PO)), Omega-3 Fatty Acids (FISH OIL PO), Multiple Minerals-Vitamins (CALCIUM CITRATE PLUS/MAGNESIUM PO), nystatin cream, (BLACK ELDERBERRY,BERRY-FLOWER, PO), sodium chloride, albuterol, fluticasone, metoprolol tartrate, hyoscyamine, omeprazole, carboxymethylcellulose, hydrOXYzine, mupirocin ointment, triamcinolone cream, montelukast, colchicine, and aspirin EC.  Meds ordered this encounter  Medications  . aspirin EC 81 MG tablet    Sig: Take 81 mg by mouth 3 (three) times a week.    Problem List Items Addressed This Visit      Unprioritized   Erythema nodosum    Improving a lot Rheum app pending       Other Visit Diagnoses   None.     Follow-up: Return if symptoms worsen or fail to improve.  [name], DO

## 2016-06-09 DIAGNOSIS — L52 Erythema nodosum: Secondary | ICD-10-CM | POA: Insufficient documentation

## 2016-06-09 NOTE — Assessment & Plan Note (Signed)
Improving a lot Rheum app pending

## 2016-06-11 ENCOUNTER — Other Ambulatory Visit: Payer: Self-pay | Admitting: Family Medicine

## 2016-06-22 ENCOUNTER — Other Ambulatory Visit: Payer: Self-pay

## 2016-06-22 DIAGNOSIS — H2513 Age-related nuclear cataract, bilateral: Secondary | ICD-10-CM | POA: Diagnosis not present

## 2016-06-22 DIAGNOSIS — H401131 Primary open-angle glaucoma, bilateral, mild stage: Secondary | ICD-10-CM | POA: Diagnosis not present

## 2016-06-22 DIAGNOSIS — J302 Other seasonal allergic rhinitis: Secondary | ICD-10-CM

## 2016-06-22 DIAGNOSIS — H04123 Dry eye syndrome of bilateral lacrimal glands: Secondary | ICD-10-CM | POA: Diagnosis not present

## 2016-06-22 MED ORDER — FLUTICASONE PROPIONATE 50 MCG/ACT NA SUSP: NASAL | 3 refills | Status: DC

## 2016-07-26 DIAGNOSIS — R768 Other specified abnormal immunological findings in serum: Secondary | ICD-10-CM | POA: Diagnosis not present

## 2016-07-26 DIAGNOSIS — M797 Fibromyalgia: Secondary | ICD-10-CM | POA: Diagnosis not present

## 2016-07-26 DIAGNOSIS — R5383 Other fatigue: Secondary | ICD-10-CM | POA: Diagnosis not present

## 2016-07-26 DIAGNOSIS — L52 Erythema nodosum: Secondary | ICD-10-CM | POA: Diagnosis not present

## 2016-07-26 DIAGNOSIS — B370 Candidal stomatitis: Secondary | ICD-10-CM | POA: Diagnosis not present

## 2016-08-13 ENCOUNTER — Other Ambulatory Visit: Payer: Self-pay | Admitting: Internal Medicine

## 2016-08-22 ENCOUNTER — Telehealth: Payer: Self-pay | Admitting: *Deleted

## 2016-08-22 NOTE — Telephone Encounter (Signed)
Patient called back. States she will decide later if she wants to have the AWV. Dose not want to schedule at this time.

## 2016-08-22 NOTE — Telephone Encounter (Signed)
Called pt and spoke w/ husband, Nicole Kindred. He said she will have AWV while she is here tomorrow seeing PCP. They will call back to let us know if she wants to come in before or after PCP appt so I can hold my schedule.

## 2016-08-23 ENCOUNTER — Ambulatory Visit (INDEPENDENT_AMBULATORY_CARE_PROVIDER_SITE_OTHER): Payer: Medicare Other | Admitting: Family Medicine

## 2016-08-23 ENCOUNTER — Encounter: Payer: Self-pay | Admitting: Family Medicine

## 2016-08-23 VITALS — BP 158/60 | HR 71 | Temp 98.0°F | Resp 16 | Ht 62.0 in | Wt 158.6 lb

## 2016-08-23 DIAGNOSIS — E2839 Other primary ovarian failure: Secondary | ICD-10-CM

## 2016-08-23 DIAGNOSIS — R5383 Other fatigue: Secondary | ICD-10-CM | POA: Diagnosis not present

## 2016-08-23 DIAGNOSIS — R319 Hematuria, unspecified: Secondary | ICD-10-CM | POA: Diagnosis not present

## 2016-08-23 DIAGNOSIS — I10 Essential (primary) hypertension: Secondary | ICD-10-CM | POA: Diagnosis not present

## 2016-08-23 DIAGNOSIS — L52 Erythema nodosum: Secondary | ICD-10-CM

## 2016-08-23 DIAGNOSIS — R8299 Other abnormal findings in urine: Secondary | ICD-10-CM | POA: Diagnosis not present

## 2016-08-23 DIAGNOSIS — R82998 Other abnormal findings in urine: Secondary | ICD-10-CM

## 2016-08-23 LAB — POC URINALSYSI DIPSTICK (AUTOMATED)
BILIRUBIN UA: NEGATIVE
GLUCOSE UA: NEGATIVE
Ketones, UA: NEGATIVE
NITRITE UA: NEGATIVE
Protein, UA: NEGATIVE
UROBILINOGEN UA: 0.2
pH, UA: 6.5

## 2016-08-23 LAB — COMPREHENSIVE METABOLIC PANEL
ALT: 19 U/L (ref 0–35)
AST: 25 U/L (ref 0–37)
Albumin: 4.6 g/dL (ref 3.5–5.2)
Alkaline Phosphatase: 95 U/L (ref 39–117)
BILIRUBIN TOTAL: 0.6 mg/dL (ref 0.2–1.2)
BUN: 12 mg/dL (ref 6–23)
CALCIUM: 9.9 mg/dL (ref 8.4–10.5)
CHLORIDE: 102 meq/L (ref 96–112)
CO2: 32 meq/L (ref 19–32)
Creatinine, Ser: 0.74 mg/dL (ref 0.40–1.20)
GFR: 82.24 mL/min (ref >60.00)
GLUCOSE: 98 mg/dL (ref 70–99)
POTASSIUM: 5.2 meq/L — AB (ref 3.5–5.1)
Sodium: 139 mEq/L (ref 135–145)
Total Protein: 7.3 g/dL (ref 6.0–8.3)

## 2016-08-23 LAB — LIPID PANEL
CHOL/HDL RATIO: 3
Cholesterol: 230 mg/dL — ABNORMAL HIGH (ref 0–200)
HDL: 67.1 mg/dL (ref >39.00)
LDL Cholesterol: 142 mg/dL — ABNORMAL HIGH (ref 0–99)
NONHDL: 162.58
TRIGLYCERIDES: 105 mg/dL (ref 0.0–149.0)
VLDL: 21 mg/dL (ref 0.0–40.0)

## 2016-08-23 LAB — VITAMIN B12: Vitamin B-12: 462 pg/mL (ref 211–911)

## 2016-08-23 NOTE — Progress Notes (Signed)
Patient ID: Kayla [name], female    DOB: 1946-06-27  Age: 70 y.o. MRN: [id number]    Subjective:  Subjective  HPI Kayla [name] presents for f/u bp.   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  Review of Systems  Constitutional: Negative for appetite change, diaphoresis, fatigue and unexpected weight change.  Eyes: Negative for pain, redness and visual disturbance.  Respiratory: Negative for cough, chest tightness, shortness of breath and wheezing.   Cardiovascular: Negative for chest pain, palpitations and leg swelling.  Endocrine: Negative for cold intolerance, heat intolerance, polydipsia, polyphagia and polyuria.  Genitourinary: Negative for difficulty urinating, dysuria and frequency.  Neurological: Negative for dizziness, light-headedness, numbness and headaches.    History Past Medical History:  Diagnosis Date  . Asthma   . CAP (community acquired pneumonia) 08/2012   Dr Birdie Riddle  . Diverticulitis   . Diverticulosis   . Fasting hyperglycemia 04/2011   FBS 108  . Fibromyalgia   . Fundic gland polyps of stomach, benign   . GERD (gastroesophageal reflux disease)    gastric polyp x3  . Glaucoma     Dr Bing Plume  . IBS (irritable bowel syndrome)   . Osteopenia    last 07/2011  . Pneumonia     OP as child  . Shingles     She has a past surgical history that includes Spine surgery; Colonoscopy w/ polypectomy [id number]); and Esophagogastroduodenoscopy.   Her family history includes Asthma in her paternal aunt; Breast cancer in her maternal aunt, mother, paternal aunt, and sister; Coronary artery disease in her paternal aunt; Deep vein thrombosis in her son; Heart attack in her paternal grandmother and paternal uncle; Heart disease in her maternal aunt and paternal grandfather; Heart failure in her father; Kidney disease in her maternal grandmother; Pulmonary embolism in her son.She reports that she quit smoking about 41 years ago. Her  smoking use included Cigarettes. She has a 5.00 pack-year smoking history. She has never used smokeless tobacco. She reports that she does not drink alcohol or use drugs.  Current Outpatient Prescriptions on File Prior to Visit  Medication Sig Dispense Refill  . acetaminophen (TYLENOL) 500 MG tablet Take 500 mg by mouth as needed.    Marland Kitchen albuterol (PROVENTIL HFA;VENTOLIN HFA) 108 (90 BASE) MCG/ACT inhaler Inhale 2 puffs into the lungs every 6 (six) hours as needed for wheezing or shortness of breath. As needed only 1 Inhaler 1  . aspirin EC 81 MG tablet Take 81 mg by mouth 3 (three) times a week.    . bifidobacterium infantis (ALIGN) capsule Take 1 capsule by mouth daily. 21 capsule 0  . BLACK ELDERBERRY,BERRY-FLOWER, PO Take by mouth. Reported on 03/15/2016    . carboxymethylcellulose (REFRESH PLUS) 0.5 % SOLN 1 drop 3 (three) times daily as needed.    . cetirizine (ZYRTEC) 10 MG tablet Take 10 mg by mouth as needed. Reported on 03/15/2016    . Cholecalciferol (VITAMIN D3) 2000 UNITS TABS Take by mouth daily.    . colchicine 0.6 MG tablet Take 1 tablet (0.6 mg total) by mouth 2 (two) times daily. 1 po bid x 2-3 days then take 1 a day 30 tablet 0  . EPINEPHrine (EPI-PEN) 0.3 mg/0.3 mL DEVI Inject 0.3 mLs (0.3 mg total) into the muscle once. 1 Device 0  . esomeprazole (NEXIUM) 40 MG capsule Take 40 mg by mouth as needed. Reported on 03/15/2016    . Flaxseed, Linseed, (GNP FLAX SEED OIL  PO) Take 1 mL by mouth daily.    . fluticasone (FLONASE) 50 MCG/ACT nasal spray INSTILL 2 SPRAYS IN EACH NOSTRIL ONCE DAILY 48 g 3  . GuaiFENesin (MUCINEX PO) Take by mouth as needed. Reported on 03/15/2016    . hyoscyamine (LEVSIN SL) 0.125 MG SL tablet DISSOLVE 1 TABLET UNDER THE TONGUE EVERY 6 HOURS AS NEEDED FOR ABDOMINAL PAIN 385 tablet 0  . montelukast (SINGULAIR) 10 MG tablet Take 1 tablet by mouth  daily 90 tablet 3  . Multiple Minerals-Vitamins (CALCIUM CITRATE PLUS/MAGNESIUM PO) Take by mouth.    . mupirocin  ointment (BACTROBAN) 2 % Apply to red areas and warm area twice daily 22 g 0  . nystatin cream (MYCOSTATIN) Apply 1 application topically 2 (two) times daily. 90 g 3  . Omega-3 Fatty Acids (FISH OIL PO) Take by mouth.    Marland Kitchen omeprazole (PRILOSEC) 40 MG capsule TAKE 1 CAPSULE BY MOUTH  DAILY 90 capsule 0  . ranitidine (ZANTAC) 150 MG tablet Take 150 mg by mouth at bedtime.     . sodium chloride (OCEAN) 0.65 % SOLN nasal spray Place 1 spray into both nostrils as needed for congestion.    . travoprost, benzalkonium, (TRAVATAN) 0.004 % ophthalmic solution Place 1 drop into both eyes at bedtime.    . triamcinolone cream (KENALOG) 0.1 % Apply 1 application topically 2 (two) times daily. 90 g 3   No current facility-administered medications on file prior to visit.      Objective:  Objective  Physical Exam  Constitutional: She is oriented to person, place, and time. She appears well-developed and well-nourished.  HENT:  Head: Normocephalic and atraumatic.  Eyes: Conjunctivae and EOM are normal.  Neck: Normal range of motion. Neck supple. No JVD present. Carotid bruit is not present. No thyromegaly present.  Cardiovascular: Normal rate, regular rhythm and normal heart sounds.   No murmur heard. Pulmonary/Chest: Effort normal and breath sounds normal. No respiratory distress. She has no wheezes. She has no rales. She exhibits no tenderness.  Musculoskeletal: She exhibits no edema.  Neurological: She is alert and oriented to person, place, and time.  Psychiatric: She has a normal mood and affect. Her behavior is normal. Judgment and thought content normal.  Nursing note and vitals reviewed.  BP (!) 158/60 (BP Location: Right Arm, Patient Position: Sitting, Cuff Size: Normal)   Pulse 71   Temp 98 F (36.7 C) (Oral)   Resp 16   Ht 5\' 2"  (1.575 m)   Wt 158 lb 9.6 oz (71.9 kg)   SpO2 91%   BMI 29.01 kg/m  Wt Readings from Last 3 Encounters:  08/23/16 158 lb 9.6 oz (71.9 kg)  06/07/16 162 lb  (73.5 kg)  05/24/16 160 lb 6.4 oz (72.8 kg)     Lab Results  Component Value Date   WBC 4.3 05/24/2016   HGB 13.2 05/24/2016   HCT 38.7 05/24/2016   PLT 167.0 05/24/2016   GLUCOSE 98 08/23/2016   CHOL 230 (H) 08/23/2016   TRIG 105.0 08/23/2016   HDL 67.10 08/23/2016   LDLDIRECT 153.3 12/18/2012   LDLCALC 142 (H) 08/23/2016   ALT 19 08/23/2016   AST 25 08/23/2016   NA 139 08/23/2016   K 5.2 (H) 08/23/2016   CL 102 08/23/2016   CREATININE 0.74 08/23/2016   BUN 12 08/23/2016   CO2 32 08/23/2016   TSH 1.22 12/18/2012   INR 0.97 12/15/2009   HGBA1C 5.4 11/01/2011    Dg Chest 2 View  Result Date: 05/24/2016 CLINICAL DATA:  70 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 Intermittent cough. Former smoker. Initial encounter. EXAM: CHEST  2 VIEW COMPARISON:  05/23/2011 and earlier. FINDINGS: Stable chronic thoracolumbar scoliosis with stable appearance of mid thoracic to lumbar posterior spinal fusion hardware. Osteopenia with chronic mid thoracic disc and endplate degeneration. Lung volumes are stable at the upper limits of normal. Mild chronic increased interstitial markings appear stable to mildly regressed since 2012. Normal cardiac size and mediastinal contours. Visualized tracheal air column is within normal limits. No pneumothorax, pulmonary edema, pleural effusion or confluent pulmonary opacity. Negative visible bowel gas pattern. IMPRESSION: No acute cardiopulmonary abnormality. Electronically Signed   By: Genevie Ann M.D.   On: 05/24/2016 16:07     Assessment & Plan:  Plan  I have discontinued Kayla [name]'s hydrOXYzine. I am also having her maintain her GuaiFENesin (MUCINEX PO), EPINEPHrine, ranitidine, acetaminophen, Vitamin D3, cetirizine, esomeprazole, bifidobacterium infantis, travoprost (benzalkonium), (Flaxseed, Linseed, (GNP FLAX SEED OIL PO)), Omega-3 Fatty Acids (FISH OIL PO), Multiple Minerals-Vitamins (CALCIUM CITRATE PLUS/MAGNESIUM PO), nystatin cream, (BLACK  ELDERBERRY,BERRY-FLOWER, PO), sodium chloride, albuterol, hyoscyamine, carboxymethylcellulose, mupirocin ointment, triamcinolone cream, montelukast, colchicine, aspirin EC, fluticasone, omeprazole, and metoprolol tartrate. +0 Meds ordered this encounter  Medications  . metoprolol tartrate (LOPRESSOR) 25 MG tablet    Sig: TAKE 1 TABLET BY MOUTH TWO  TIMES DAILY AND AN  ADDITIONAL TABLET ONCE  DAILY AS NEEDED    Dispense:  270 tablet    Refill:  1    Problem List Items Addressed This Visit      Unprioritized   Hypertension - Primary    Slightly elevated today rto 3 months or sooner prn       Relevant Medications   metoprolol tartrate (LOPRESSOR) 25 MG tablet   Other Relevant Orders   Comprehensive metabolic panel (Completed)   Lipid panel (Completed)   POCT Urinalysis Dipstick (Automated) (Completed)    Other Visit Diagnoses    Fatigue, unspecified type       Relevant Orders   Vitamin B12 (Completed)   Estrogen deficiency       Relevant Orders   DG Bone Density   Hematuria, unspecified type       Relevant Orders   Urine culture (Completed)   Leukocytes in urine       Relevant Orders   Urine culture (Completed)      Follow-up: Return in about 6 months (around 02/21/2017) for hypertension, hyperlipidemia.  [name], DO

## 2016-08-23 NOTE — Progress Notes (Signed)
Pre visit review using our clinic review tool, if applicable. No additional management support is needed unless otherwise documented below in the visit note. 

## 2016-08-23 NOTE — Patient Instructions (Signed)
Hypertension Hypertension, commonly called high blood pressure, is when the force of blood pumping through your arteries is too strong. Your arteries are the blood vessels that carry blood from your heart throughout your body. A blood pressure reading consists of a higher number over a lower number, such as 110/72. The higher number (systolic) is the pressure inside your arteries when your heart pumps. The lower number (diastolic) is the pressure inside your arteries when your heart relaxes. Ideally you want your blood pressure below 120/80. Hypertension forces your heart to work harder to pump blood. Your arteries may become narrow or stiff. Having untreated or uncontrolled hypertension can cause heart attack, stroke, kidney disease, and other problems. What increases the risk? Some risk factors for high blood pressure are controllable. Others are not. Risk factors you cannot control include:  Race. You may be at higher risk if you are African American.  Age. Risk increases with age.  Gender. Men are at higher risk than women before age 45 years. After age 65, women are at higher risk than men. Risk factors you can control include:  Not getting enough exercise or physical activity.  Being overweight.  Getting too much fat, sugar, calories, or salt in your diet.  Drinking too much alcohol. What are the signs or symptoms? Hypertension does not usually cause signs or symptoms. Extremely high blood pressure (hypertensive crisis) may cause headache, anxiety, shortness of breath, and nosebleed. How is this diagnosed? To check if you have hypertension, your health care provider will measure your blood pressure while you are seated, with your arm held at the level of your heart. It should be measured at least twice using the same arm. Certain conditions can cause a difference in blood pressure between your right and left arms. A blood pressure reading that is higher than normal on one occasion does  not mean that you need treatment. If it is not clear whether you have high blood pressure, you may be asked to return on a different day to have your blood pressure checked again. Or, you may be asked to monitor your blood pressure at home for 1 or more weeks. How is this treated? Treating high blood pressure includes making lifestyle changes and possibly taking medicine. Living a healthy lifestyle can help lower high blood pressure. You may need to change some of your habits. Lifestyle changes may include:  Following the DASH diet. This diet is high in fruits, vegetables, and whole grains. It is low in salt, red meat, and added sugars.  Keep your sodium intake below 2,300 mg per day.  Getting at least 30-45 minutes of aerobic exercise at least 4 times per week.  Losing weight if necessary.  Not smoking.  Limiting alcoholic beverages.  Learning ways to reduce stress. Your health care provider may prescribe medicine if lifestyle changes are not enough to get your blood pressure under control, and if one of the following is true:  You are 18-59 years of age and your systolic blood pressure is above 140.  You are 60 years of age or older, and your systolic blood pressure is above 150.  Your diastolic blood pressure is above 90.  You have diabetes, and your systolic blood pressure is over 140 or your diastolic blood pressure is over 90.  You have kidney disease and your blood pressure is above 140/90.  You have heart disease and your blood pressure is above 140/90. Your personal target blood pressure may vary depending on your medical   conditions, your age, and other factors. Follow these instructions at home:  Have your blood pressure rechecked as directed by your health care provider.  Take medicines only as directed by your health care provider. Follow the directions carefully. Blood pressure medicines must be taken as prescribed. The medicine does not work as well when you skip  doses. Skipping doses also puts you at risk for problems.  Do not smoke.  Monitor your blood pressure at home as directed by your health care provider. Contact a health care provider if:  You think you are having a reaction to medicines taken.  You have recurrent headaches or feel dizzy.  You have swelling in your ankles.  You have trouble with your vision. Get help right away if:  You develop a severe headache or confusion.  You have unusual weakness, numbness, or feel faint.  You have severe chest or abdominal pain.  You vomit repeatedly.  You have trouble breathing. This information is not intended to replace advice given to you by your health care provider. Make sure you discuss any questions you have with your health care provider. Document Released: 08/27/2005 Document Revised: 02/02/2016 Document Reviewed: 06/19/2013 Elsevier Interactive Patient Education  2017 Elsevier Inc.  

## 2016-08-24 ENCOUNTER — Other Ambulatory Visit: Payer: Self-pay | Admitting: Family Medicine

## 2016-08-24 DIAGNOSIS — E785 Hyperlipidemia, unspecified: Secondary | ICD-10-CM

## 2016-08-24 LAB — URINE CULTURE: ORGANISM ID, BACTERIA: NO GROWTH

## 2016-08-24 MED ORDER — SIMVASTATIN 20 MG PO TABS: 20.0000 mg | ORAL_TABLET | Freq: Every day | ORAL | 2 refills | Status: DC

## 2016-08-25 MED ORDER — METOPROLOL TARTRATE 25 MG PO TABS: ORAL_TABLET | ORAL | 1 refills | Status: DC

## 2016-08-25 NOTE — Assessment & Plan Note (Signed)
Per rheum 

## 2016-08-25 NOTE — Assessment & Plan Note (Signed)
Slightly elevated today rto 3 months or sooner prn

## 2016-08-31 DIAGNOSIS — R768 Other specified abnormal immunological findings in serum: Secondary | ICD-10-CM | POA: Diagnosis not present

## 2016-08-31 DIAGNOSIS — M150 Primary generalized (osteo)arthritis: Secondary | ICD-10-CM | POA: Diagnosis not present

## 2016-08-31 DIAGNOSIS — M797 Fibromyalgia: Secondary | ICD-10-CM | POA: Diagnosis not present

## 2016-08-31 DIAGNOSIS — L52 Erythema nodosum: Secondary | ICD-10-CM | POA: Diagnosis not present

## 2016-08-31 DIAGNOSIS — M2550 Pain in unspecified joint: Secondary | ICD-10-CM | POA: Diagnosis not present

## 2016-09-13 ENCOUNTER — Encounter: Payer: Self-pay | Admitting: Medical

## 2016-09-13 ENCOUNTER — Ambulatory Visit (INDEPENDENT_AMBULATORY_CARE_PROVIDER_SITE_OTHER): Payer: Medicare Other | Admitting: Medical

## 2016-09-13 VITALS — BP 148/54 | HR 60 | Temp 98.6°F | Ht 62.0 in | Wt 158.4 lb

## 2016-09-13 DIAGNOSIS — R21 Rash and other nonspecific skin eruption: Secondary | ICD-10-CM | POA: Diagnosis not present

## 2016-09-13 DIAGNOSIS — J029 Acute pharyngitis, unspecified: Secondary | ICD-10-CM | POA: Diagnosis not present

## 2016-09-13 MED ORDER — PREDNISONE 10 MG PO TABS: ORAL_TABLET | ORAL | 0 refills | Status: DC

## 2016-09-13 NOTE — Patient Instructions (Addendum)
For your rash and possible allergy will rx very low dose prednisone to avoid side effect.  Use aveeno or cetaphil to moisturize skin.  Use your hydroxyzine for itching.  You can stop simvastatin for next 10 days since you question if allergy. Then restart and see if rash were to occur again.  We did rapid strep to excude this as rare cause of rash but test was negative.  Follow up in 7 days or as needed

## 2016-09-13 NOTE — Progress Notes (Signed)
Pre visit review using our clinic tool,if applicable. No additional management support is needed unless otherwise documented below in the visit note.  

## 2016-09-13 NOTE — Progress Notes (Signed)
Subjective:    Patient ID: Kayla [name], female    DOB: 1946-04-28, 71 y.o.   MRN: [id number]  HPI  Pt in with slight rash to both arms that itches. And some on her upper left side chest. And some on her rt side abdomen. (rashes for 7 days total)  Pt allergy list states prednisone. In the past/summer  prednisone made her cheeks red. But she continued the prednisone and her cheek redness resolved.    Pt describes that dermatologist told her in the past she has sensitive skin. Stressed moisturizing.  Recent very cold weather as well.   Pt has no sob, no wheezing and on swelling of lips.  Pt did mention very faint st with mild pnd. She speculate maybe viral rash.(I considered remote possible strep rash but doubtful.)  Pt just started simvastatin on Dec 17th.    Review of Systems  Constitutional: Negative for chills, fatigue and fever.  Respiratory: Negative for cough, chest tightness, shortness of breath and wheezing.   Cardiovascular: Negative for chest pain and palpitations.  Gastrointestinal: Negative for abdominal pain.  Musculoskeletal: Negative for back pain.  Skin: Positive for rash.  Neurological: Negative for dizziness, weakness and headaches.  Hematological: Negative for adenopathy. Does not bruise/bleed easily.  Psychiatric/Behavioral: Negative for behavioral problems and confusion.    Past Medical History:  Diagnosis Date  . Asthma   . CAP (community acquired pneumonia) 08/2012   Dr Birdie Riddle  . Diverticulitis   . Diverticulosis   . Fasting hyperglycemia 04/2011   FBS 108  . Fibromyalgia   . Fundic gland polyps of stomach, benign   . GERD (gastroesophageal reflux disease)    gastric polyp x3  . Glaucoma     Dr Bing Plume  . IBS (irritable bowel syndrome)   . Osteopenia    last 07/2011  . Pneumonia     OP as child  . Shingles      Social History   Social History  . Marital status: Married    Spouse name: N/A  . Number of children: N/A  . Years of  education: N/A   Occupational History  . Not on file.   Social History Main Topics  . Smoking status: Former Smoker    Packs/day: 0.50    Years: 10.00    Types: Cigarettes    Quit date: 09/10/1974  . Smokeless tobacco: Never Used     Comment: smoked 1966- 1976, up to 1 ppd  . Alcohol use No  . Drug use: No  . Sexual activity: Yes   Other Topics Concern  . Not on file   Social History Narrative   Daily caffeine 3 cups   Regular exercise   Married          Past Surgical History:  Procedure Laterality Date  . COLONOSCOPY W/ POLYPECTOMY  [phone]   last  colonoscopy 2007, Dr Carlean Purl  . ESOPHAGOGASTRODUODENOSCOPY    . SPINE SURGERY     T9-L5 fusions    Family History  Problem Relation Age of Onset  . Breast cancer Mother   . Breast cancer Sister   . Breast cancer Maternal Aunt     two  . Breast cancer Paternal Aunt   . Asthma Paternal Aunt   . Heart failure Father     CHF  . Kidney disease Maternal Grandmother   . Ovarian cancer      Niece x2  . Breast cancer      niece  . Coronary  artery disease Paternal Aunt     triple CABG  . Heart attack Paternal Grandmother     MI in late 39s  . Heart disease Paternal Grandfather   . Heart attack Paternal Uncle      MI in 79s  . Heart disease Maternal Aunt   . Diabetes Neg Hx   . Stroke Neg Hx   . COPD Neg Hx   . Colon polyps Neg Hx   . Pulmonary embolism Son   . Deep vein thrombosis Son     Allergies  Allergen Reactions  . Cephalosporins     Blisters  orally  . Gabapentin     REACTION: rash  . Levofloxacin     REACTION: stomach ache and rash  . Psyllium     REACTION: rash  . Sulfonamide Derivatives     REACTION: shock, urticaria  . Aspirin     REACTION: stomach pain  . Belladonna   . Ciprofloxacin Hcl   . Conjugated Estrogens   . Doxycycline     Abdominal pain  . Nabumetone   . Nsaids   . Soybean-Containing Drug Products Other (See Comments)    GI upset  . Zicam Cold Remedy [Erysidoron  #1]   . Prednisone Rash    NO PROBLEM WITH MEDROL DOSE PAK Oral prednisone caused facial burning, "made my face feel like it was on fire"    Current Outpatient Prescriptions on File Prior to Visit  Medication Sig Dispense Refill  . acetaminophen (TYLENOL) 500 MG tablet Take 500 mg by mouth as needed.    Marland Kitchen albuterol (PROVENTIL HFA;VENTOLIN HFA) 108 (90 BASE) MCG/ACT inhaler Inhale 2 puffs into the lungs every 6 (six) hours as needed for wheezing or shortness of breath. As needed only 1 Inhaler 1  . bifidobacterium infantis (ALIGN) capsule Take 1 capsule by mouth daily. 21 capsule 0  . BLACK ELDERBERRY,BERRY-FLOWER, PO Take by mouth. Reported on 03/15/2016    . carboxymethylcellulose (REFRESH PLUS) 0.5 % SOLN 1 drop 3 (three) times daily as needed.    . cetirizine (ZYRTEC) 10 MG tablet Take 10 mg by mouth as needed. Reported on 03/15/2016    . Cholecalciferol (VITAMIN D3) 2000 UNITS TABS Take by mouth daily.    Marland Kitchen EPINEPHrine (EPI-PEN) 0.3 mg/0.3 mL DEVI Inject 0.3 mLs (0.3 mg total) into the muscle once. 1 Device 0  . esomeprazole (NEXIUM) 40 MG capsule Take 40 mg by mouth as needed. Reported on 03/15/2016    . Flaxseed, Linseed, (GNP FLAX SEED OIL PO) Take 1 mL by mouth daily.    . fluticasone (FLONASE) 50 MCG/ACT nasal spray INSTILL 2 SPRAYS IN EACH NOSTRIL ONCE DAILY 48 g 3  . GuaiFENesin (MUCINEX PO) Take by mouth as needed. Reported on 03/15/2016    . hyoscyamine (LEVSIN SL) 0.125 MG SL tablet DISSOLVE 1 TABLET UNDER THE TONGUE EVERY 6 HOURS AS NEEDED FOR ABDOMINAL PAIN 385 tablet 0  . metoprolol tartrate (LOPRESSOR) 25 MG tablet TAKE 1 TABLET BY MOUTH TWO  TIMES DAILY AND AN  ADDITIONAL TABLET ONCE  DAILY AS NEEDED 270 tablet 1  . montelukast (SINGULAIR) 10 MG tablet Take 1 tablet by mouth  daily 90 tablet 3  . Multiple Minerals-Vitamins (CALCIUM CITRATE PLUS/MAGNESIUM PO) Take by mouth.    . nystatin cream (MYCOSTATIN) Apply 1 application topically 2 (two) times daily. 90 g 3  . Omega-3 Fatty  Acids (FISH OIL PO) Take by mouth.    Marland Kitchen omeprazole (PRILOSEC) 40 MG capsule TAKE 1  CAPSULE BY MOUTH  DAILY 90 capsule 0  . ranitidine (ZANTAC) 150 MG tablet Take 150 mg by mouth at bedtime.     . simvastatin (ZOCOR) 20 MG tablet Take 1 tablet (20 mg total) by mouth daily. 30 tablet 2  . sodium chloride (OCEAN) 0.65 % SOLN nasal spray Place 1 spray into both nostrils as needed for congestion.    . travoprost, benzalkonium, (TRAVATAN) 0.004 % ophthalmic solution Place 1 drop into both eyes at bedtime.    . triamcinolone cream (KENALOG) 0.1 % Apply 1 application topically 2 (two) times daily. 90 g 3  . aspirin EC 81 MG tablet Take 81 mg by mouth 3 (three) times a week.    . colchicine 0.6 MG tablet Take 1 tablet (0.6 mg total) by mouth 2 (two) times daily. 1 po bid x 2-3 days then take 1 a day (Patient not taking: Reported on 09/13/2016) 30 tablet 0  . mupirocin ointment (BACTROBAN) 2 % Apply to red areas and warm area twice daily (Patient not taking: Reported on 09/13/2016) 22 g 0   No current facility-administered medications on file prior to visit.     BP (!) 148/54   Pulse 60   Temp 98.6 F (37 C) (Oral)   Ht 5\' 2"  (1.575 m)   Wt 158 lb 6.4 oz (71.8 kg)   SpO2 100%   BMI 28.97 kg/m       Objective:   Physical Exam  . General- No acute distress. Pleasant patient.   heent-normal. Throat does not look suspicious. Neck- Full range of motion, no jvd Lungs- Clear, even and unlabored. Heart- regular rate and rhythm. Neurologic- CNII- XII grossly intact.  Abdomen- soft, nt, nd, +bs.  Skin- diffuse upper chest rash. Rt upper buttox and rt flank. Also some on arms.       Assessment & Plan:  For your rash and possible allergy will rx very low dose prednisone to avoid side effect.  Rx aveeno or cetaphil  to moisturize skin.  Use your hydroxyzine for itching.  You can stop simvastatin for next 10 days since you question if allergy. Then restart and see if rash were to occur  again.  We did rapid strep to excude this as rare cause of rash but test was negative.  Follow up in 7 days or as needed  Kayla [name], Percell Miller, [employer]

## 2016-10-03 ENCOUNTER — Encounter: Payer: Self-pay | Admitting: Family Medicine

## 2016-10-03 DIAGNOSIS — Z803 Family history of malignant neoplasm of breast: Secondary | ICD-10-CM | POA: Diagnosis not present

## 2016-10-03 DIAGNOSIS — Z1231 Encounter for screening mammogram for malignant neoplasm of breast: Secondary | ICD-10-CM | POA: Diagnosis not present

## 2016-10-03 DIAGNOSIS — M810 Age-related osteoporosis without current pathological fracture: Secondary | ICD-10-CM | POA: Diagnosis not present

## 2016-10-03 LAB — HM DEXA SCAN

## 2016-10-05 ENCOUNTER — Encounter: Payer: Self-pay | Admitting: Family Medicine

## 2016-10-05 NOTE — Telephone Encounter (Signed)
Re can refill the hydroxyzine for her x 1

## 2016-10-09 ENCOUNTER — Other Ambulatory Visit: Payer: Self-pay | Admitting: Family Medicine

## 2016-10-09 MED ORDER — HYDROXYZINE HCL 10 MG PO TABS: 10.0000 mg | ORAL_TABLET | Freq: Three times a day (TID) | ORAL | 0 refills | Status: DC | PRN

## 2016-10-09 NOTE — Telephone Encounter (Signed)
[place] for now-- recheck labs 2 months Lipid, cmp

## 2016-10-10 ENCOUNTER — Other Ambulatory Visit: Payer: Self-pay | Admitting: *Deleted

## 2016-10-10 DIAGNOSIS — I10 Essential (primary) hypertension: Secondary | ICD-10-CM

## 2016-10-10 DIAGNOSIS — E785 Hyperlipidemia, unspecified: Secondary | ICD-10-CM

## 2016-10-15 ENCOUNTER — Ambulatory Visit: Payer: Medicare Other | Admitting: Medical

## 2016-11-08 ENCOUNTER — Encounter: Payer: Self-pay | Admitting: Family Medicine

## 2016-11-20 ENCOUNTER — Telehealth: Payer: Self-pay

## 2016-11-20 NOTE — Telephone Encounter (Signed)
Spoke with pt about her bone density scan results, pt state she understand results, medication instruction for prolia injection but per patient she was advised by her orthodontist to stop taking prolia injection because while he's reconstructing her tooth her bones won't heal. Patient state she do not prefer to take the prolia injection due to side effect. I advised patient she should consult her provider prior to stopping prolia injection. Patient have no questions or concerns at this time. Results enter and sent for scanning. LB

## 2016-11-21 ENCOUNTER — Other Ambulatory Visit: Payer: Medicare Other

## 2016-11-26 ENCOUNTER — Telehealth: Payer: Self-pay

## 2016-11-26 DIAGNOSIS — M810 Age-related osteoporosis without current pathological fracture: Secondary | ICD-10-CM

## 2016-11-26 NOTE — Telephone Encounter (Signed)
Spoke with pt, pt state she understand results of dexa scan. Pt report she has stop taking prolia without consulting provider. Provider advised pt to continuing taking prolia due to worsening osteoporosis, and refer to endocrinology. Pt state she will need a couple days to think about starting back on Prolia, and agreed to referral to endo. Pt had no further questions or concerns at this time. LB

## 2016-11-29 DIAGNOSIS — L52 Erythema nodosum: Secondary | ICD-10-CM | POA: Diagnosis not present

## 2016-11-29 DIAGNOSIS — R768 Other specified abnormal immunological findings in serum: Secondary | ICD-10-CM | POA: Diagnosis not present

## 2016-11-29 DIAGNOSIS — M797 Fibromyalgia: Secondary | ICD-10-CM | POA: Diagnosis not present

## 2016-11-29 DIAGNOSIS — M150 Primary generalized (osteo)arthritis: Secondary | ICD-10-CM | POA: Diagnosis not present

## 2016-12-10 ENCOUNTER — Other Ambulatory Visit: Payer: Self-pay | Admitting: Family Medicine

## 2016-12-10 ENCOUNTER — Encounter: Payer: Self-pay | Admitting: Family Medicine

## 2016-12-10 MED ORDER — ALBUTEROL SULFATE HFA 108 (90 BASE) MCG/ACT IN AERS: 2.0000 | INHALATION_SPRAY | Freq: Four times a day (QID) | RESPIRATORY_TRACT | 3 refills | Status: DC | PRN

## 2016-12-13 ENCOUNTER — Other Ambulatory Visit: Payer: Medicare Other

## 2016-12-13 ENCOUNTER — Ambulatory Visit: Payer: Medicare Other | Admitting: *Deleted

## 2017-01-04 ENCOUNTER — Ambulatory Visit (INDEPENDENT_AMBULATORY_CARE_PROVIDER_SITE_OTHER): Payer: Medicare Other | Admitting: Internal Medicine

## 2017-01-04 ENCOUNTER — Encounter: Payer: Self-pay | Admitting: Internal Medicine

## 2017-01-04 VITALS — BP 150/72 | HR 60 | Resp 16 | Ht 61.5 in | Wt 157.1 lb

## 2017-01-04 DIAGNOSIS — M810 Age-related osteoporosis without current pathological fracture: Secondary | ICD-10-CM

## 2017-01-04 LAB — BASIC METABOLIC PANEL WITH GFR
BUN: 13 mg/dL (ref 7–25)
CALCIUM: 9.9 mg/dL (ref 8.6–10.4)
CHLORIDE: 102 mmol/L (ref 98–110)
CO2: 26 mmol/L (ref 20–31)
Creat: 0.78 mg/dL (ref 0.60–0.93)
GFR, EST NON AFRICAN AMERICAN: 77 mL/min (ref >=60)
GFR, Est African American: 88 mL/min (ref >=60)
GLUCOSE: 86 mg/dL (ref 65–99)
POTASSIUM: 5.4 mmol/L — AB (ref 3.5–5.3)
Sodium: 140 mmol/L (ref 135–146)

## 2017-01-04 LAB — VITAMIN D 25 HYDROXY (VIT D DEFICIENCY, FRACTURES): VITD: 38.91 ng/mL (ref 30.00–100.00)

## 2017-01-04 NOTE — Patient Instructions (Signed)
Please stop at the lab.  Please look up OsteoStrong.  We will let you know about Prolia.  Please come back in 1 year.  How Can I Prevent Falls? Men and women with osteoporosis need to take care not to fall down. Falls can break bones. Some reasons people fall are: Poor vision  Poor balance  Certain diseases that affect how you walk  Some types of medicine, such as sleeping pills.  Some tips to help prevent falls outdoors are: Use a cane or walker  Wear rubber-soled shoes so you don't slip  Walk on grass when sidewalks are slippery  In winter, put salt or kitty litter on icy sidewalks.  Some ways to help prevent falls indoors are: Keep rooms free of clutter, especially on floors  Use plastic or carpet runners on slippery floors  Wear low-heeled shoes that provide good support  Do not walk in socks, stockings, or slippers  Be sure carpets and area rugs have skid-proof backs or are tacked to the floor  Be sure stairs are well lit and have rails on both sides  Put grab bars on bathroom walls near tub, shower, and toilet  Use a rubber bath mat in the shower or tub  Keep a flashlight next to your bed  Use a sturdy step stool with a handrail and wide steps  Add more lights in rooms (and night lights) Buy a cordless phone to keep with you so that you don't have to rush to the phone       when it rings and so that you can call for help if you fall.   (adapted from http://www.niams.NightlifePreviews.se)  Dietary sources of calcium and vitamin D:  Calcium content (mg) - http://www.niams.MoviePins.co.za  Fortified oatmeal, 1 packet 350  Sardines, canned in oil, with edible bones, 3 oz. 324  Cheddar cheese, 1 oz. shredded 306  Milk, nonfat, 1 cup 302  Milkshake, 1 cup 300  Yogurt, plain, low-fat, 1 cup 300  Soybeans, cooked, 1 cup 261  Tofu, firm, with calcium,  cup 204  Orange juice, fortified with calcium, 6 oz. 200-260  (varies)  Salmon, canned, with edible bones, 3 oz. 181  Pudding, instant, made with 2% milk,  cup 153  Baked beans, 1 cup [place], 1% milk fat, 1 cup 138  Spaghetti, lasagna, 1 cup 125  Frozen yogurt, vanilla, soft-serve,  cup 103  Ready-to-eat cereal, fortified with calcium, 1 cup 100-1,000 (varies)  Cheese pizza, 1 slice 325  Fortified waffles, 2 100  Turnip greens, boiled,  cup 99  Broccoli, raw, 1 cup 90  Ice cream, vanilla,  cup 85  Soy or rice milk, fortified with calcium, 1 cup 80-500 (varies)   Vitamin D content (International Units, IU) - https://www.ars.usda.gov Cod liver oil, 1 tablespoon 1,360  Swordfish, cooked, 3 oz 566  Salmon (sockeye), cooked, 3 oz 447  Tuna fish, canned in water, drained, 3 oz 154  Orange juice fortified with vitamin D, 1 cup (check product labels, as amount of added vitamin D varies) 137  Milk, nonfat, reduced fat, and whole, vitamin D-fortified, 1 cup 115-124  Yogurt, fortified with 20% of the daily value for vitamin D, 6 oz 80  Margarine, fortified, 1 tablespoon 60  Sardines, canned in oil, drained, 2 sardines 46  Liver, beef, cooked, 3 oz 42  Egg, 1 large (vitamin D is found in yolk) 41  Ready-to-eat cereal, fortified with 10% of the daily value for vitamin D, 0.75-1 cup  40  Cheese, Swiss, 1 oz 6   Exercise for Strong Bones (from [employer]) There are two types of exercises that are important for building and maintaining bone density:  weight-bearing and muscle-strengthening exercises. Weight-bearing Exercises These exercises include activities that make you move against gravity while staying upright. Weight-bearing exercises can be high-impact or low-impact. High-impact weight-bearing exercises help build bones and keep them strong. If you have broken a bone due to osteoporosis or are at risk of breaking a bone, you may need to avoid high-impact exercises. If youre not sure, you should check with your  healthcare provider. Examples of high-impact weight-bearing exercises are:  Dancing  Doing high-impact aerobics  Hiking  Jogging/running  Jumping Rope  Stair climbing  Tennis Low-impact weight-bearing exercises can also help keep bones strong and are a safe alternative if you cannot do high-impact exercises. Examples of low-impact weight-bearing exercises are:  Using elliptical training machines  Doing low-impact aerobics  Using stair-step machines  Fast walking on a treadmill or outside Muscle-Strengthening Exercises These exercises include activities where you move your body, a weight or some other resistance against gravity. They are also known as resistance exercises and include:  Lifting weights  Using elastic exercise bands  Using weight machines  Lifting your own body weight  Functional movements, such as standing and rising up on your toes Yoga and Pilates can also improve strength, balance and flexibility. However, certain positions may not be safe for people with osteoporosis or those at increased risk of broken bones. For example, exercises that have you bend forward may increase the chance of breaking a bone in the spine. A physical therapist should be able to help you learn which exercises are safe and appropriate for you. Non-Impact Exercises Non-impact exercises can help you to improve balance, posture and how well you move in everyday activities. These exercises can also help to increase muscle strength and decrease the risk of falls and broken bones. Some of these exercises include:  Balance exercises that strengthen your legs and test your balance, such as Tai Chi, can decrease your risk of falls.  Posture exercises that improve your posture and reduce rounded or sloping shoulders can help you decrease the chance of breaking a bone, especially in the spine.  Functional exercises that improve how well you move can help you with everyday activities and  decrease your chance of falling and breaking a bone. For example, if you have trouble getting up from a chair or climbing stairs, you should do these activities as exercises. A physical therapist can teach you balance, posture and functional exercises. Starting a New Exercise Program If you havent exercised regularly for a while, check with your healthcare provider before beginning a new exercise program--particularly if you have health problems such as heart disease, diabetes or high blood pressure. If youre at high risk of breaking a bone, you should work with a physical therapist to develop a safe exercise program. Once you have your healthcare providers approval, start slowly. If youve already broken bones in the spine because of osteoporosis, be very careful to avoid activities that require reaching down, bending forward, rapid twisting motions, heavy lifting and those that increase your chance of a fall. As you get started, your muscles may feel sore for a day or two after you exercise. If soreness lasts longer, you may be working too hard and need to ease up. Exercises should be done in a pain-free range of motion. How Much Exercise Do  You Need? Weight-bearing exercises 30 minutes on most days of the week. Do a 30-minutesession or multiple sessions spread out throughout the day. The benefits to your bones are the same.   Muscle-strengthening exercises Two to three days per week. If you dont have much time for strengthening/resistance training, do small amounts at a time. You can do just one body part each day. For example do arms one day, legs the next and trunk the next. You can also spread these exercises out during your normal day.  Balance, posture and functional exercises Every day or as often as needed. You may want to focus on one area more than the others. If you have fallen or lose your balance, spend time doing balance exercises. If you are getting rounded shoulders, work more on  posture exercises. If you have trouble climbing stairs or getting up from the couch, do more functional exercises. You can also perform these exercises at one time or spread them during your day. Work with a phyiscal therapist to learn the right exercises for you.    Denosumab: Patient drug information (Up-to-date) Copyright [phone] [place] rights reserved.  Brand Names: [place]  ProliaDelton See What is this drug used for?  It is used to treat soft, brittle bones (osteoporosis).  It is used for bone growth.  It is used when treating some cancers.  It may be given to you for other reasons. Talk with the doctor. What do I need to tell my doctor BEFORE I take this drug?  All products:  If you have an allergy to denosumab or any other part of this drug.  If you are allergic to any drugs like this one, any other drugs, foods, or other substances. Tell your doctor about the allergy and what signs you had, like rash; hives; itching; shortness of breath; wheezing; cough; swelling of face, lips, tongue, or throat; or any other signs.  If you have low calcium levels.  Prolia:  If you are pregnant or may be pregnant. Do not take this drug if you are pregnant.  This is not a list of all drugs or health problems that interact with this drug.  Tell your doctor and pharmacist about all of your drugs (prescription or OTC, natural products, vitamins) and health problems. You must check to make sure that it is safe for you to take this drug with all of your drugs and health problems. Do not start, stop, or change the dose of any drug without checking with your doctor. What are some things I need to know or do while I take this drug?  All products:  Tell dentists, surgeons, and other doctors that you use this drug.  This drug may raise the chance of a broken leg. Talk with your doctor.  Have your blood work checked. Talk with your doctor.  Have a bone density test. Talk with your  doctor.  Take calcium and vitamin D as you were told by your doctor.  Have a dental exam before starting this drug.  Take good care of your teeth. See a dentist often.  If you smoke, talk with your doctor.  Do not give to a child. Talk with your doctor.  Tell your doctor if you are breast-feeding. You will need to talk about any risks to your baby.  Delton See:  This drug may cause harm to the unborn baby if you take it while you are pregnant. If you get pregnant while taking this drug, call  your doctor right away.  Prolia:  Very bad infections have been reported with use of this drug. If you have any infection, are taking antibiotics now or in the recent past, or have many infections, talk with your doctor.  You may have more chance of getting an infection. Wash hands often. Stay away from people with infections, colds, or flu.  Use birth control that you can trust to prevent pregnancy while taking this drug.  If you are a man and your sex partner is pregnant or gets pregnant at any time while you are being treated, talk with your doctor. What are some side effects that I need to call my doctor about right away?  WARNING/CAUTION: Even though it may be rare, some people may have very bad and sometimes deadly side effects when taking a drug. Tell your doctor or get medical help right away if you have any of the following signs or symptoms that may be related to a very bad side effect:  All products:  Signs of an allergic reaction, like rash; hives; itching; red, swollen, blistered, or peeling skin with or without fever; wheezing; tightness in the chest or throat; trouble breathing or talking; unusual hoarseness; or swelling of the mouth, face, lips, tongue, or throat.  Signs of low calcium levels like muscle cramps or spasms, numbness and tingling, or seizures.  Mouth sores.  Any new or strange groin, hip, or thigh pain.  This drug may cause jawbone problems. The chance may be higher  the longer you take this drug. The chance may be higher if you have cancer, dental problems, dentures that do not fit well, anemia, blood clotting problems, or an infection. The chance may also be higher if you are having dental work or if you are getting chemo, some steroid drugs, or radiation. Call your doctor right away if you have jaw swelling or pain.  Xgeva:  Not hungry.  Muscle pain or weakness.  Seizures.  Shortness of breath.  Prolia:  Signs of infection. These include a fever of 100.36F (38C) or higher, chills, very bad sore throat, ear or sinus pain, cough, more sputum or change in color of sputum, pain with passing urine, mouth sores, wound that will not heal, or anal itching or pain.  Signs of a pancreas problem (pancreatitis) like very bad stomach pain, very bad back pain, or very bad upset stomach or throwing up.  Chest pain.  A heartbeat that does not feel normal.  Very bad skin irritation.  Feeling very tired or weak.  Bladder pain or pain when passing urine or change in how much urine is passed.  Passing urine often.  Swelling in the arms or legs. What are some other side effects of this drug?  All drugs may cause side effects. However, many people have no side effects or only have minor side effects. Call your doctor or get medical help if any of these side effects or any other side effects bother you or do not go away:  Xgeva:  Feeling tired or weak.  Headache.  Upset stomach or throwing up.  Loose stools (diarrhea).  Cough.  Prolia:  Back pain.  Muscle or joint pain.  Sore throat.  Runny nose.  Pain in arms or legs.  These are not all of the side effects that may occur. If you have questions about side effects, call your doctor. Call your doctor for medical advice about side effects.  You may report side effects to your national health  agency. How is this drug best taken?  Use this drug as ordered by your doctor. Read and follow the  dosing on the label closely.  It is given as a shot into the fatty part of the skin. What do I do if I miss a dose?  Call the doctor to find out what to do. How do I store and/or throw out this drug?  This drug will be given to you in a hospital or doctor's office. You will not store it at home.  Keep all drugs out of the reach of children and pets.  Check with your pharmacist about how to throw out unused drugs.  General drug facts  If your symptoms or health problems do not get better or if they become worse, call your doctor.  Do not share your drugs with others and do not take anyone else's drugs.  Keep a list of all your drugs (prescription, natural products, vitamins, OTC) with you. Give this list to your doctor.  Talk with the doctor before starting any new drug, including prescription or OTC, natural products, or vitamins.  Some drugs may have another patient information leaflet. If you have any questions about this drug, please talk with your doctor, pharmacist, or other health care provider.  If you think there has been an overdose, call your poison control center or get medical care right away. Be ready to tell or show what was taken, how much, and when it happened.

## 2017-01-04 NOTE — Progress Notes (Signed)
Patient ID: Kayla [name], female   DOB: 02-Nov-1945, 71 y.o.   MRN: [id number]    HPI  Kayla [name] is a 71 y.o.-year-old female, referred by her PCP, Dr.Lowne, for management of osteoporosis.  Pt was dx with OP in ~2015. Previous dx of Osteopenia.  I reviewed pt's DEXA scans: Date L1-L4 T score FN T score 33% distal Radius Ultra distal radius   10/03/2016  n/a (spinal fusion Sx) R: -1.8 (-6.8%*) L: -2.0 (-4.0%*)  -3.1  -3.9   08/20/2014  n/a R:-1.5 L: -1.6  -2.8  n/a   She had a wrist fx  In her 30s - playing raquetball.  No dizziness/vertigo/orthostasis/poor vision.  She has a h/o falls.   Previous OP treatments:  - Prolia - had 1 injection in 08/2015 >> skin infections  She had a tooth disintegrating in the jaw 3-4 years ago >> had a root canal >> healed ~2 years ago.  + h/o vitamin D deficiency. Now on 1800 units daily. Reviewed available vit D levels: Lab Results  Component Value Date   VD25OH 49 11/01/2011   Pt is on calcium citrate - Mg (500 + 284) 1x a day + MVI  and vitamin D. She also eats dairy and lots of green, leafy, vegetables.   No weight bearing exercises now, but walking 30 min a day.  She does not take high vitamin A doses.  Menopause was at 71 y/o.   Pt does have a FH of osteoporosis >> mother (also had BrCa), sister (also has anorexia).  No h/o hyper/hypocalcemia or hyperparathyroidism. No h/o kidney stones. Lab Results  Component Value Date   CALCIUM 9.9 08/23/2016   CALCIUM 9.5 05/24/2016   CALCIUM 9.5 02/27/2016   CALCIUM 9.5 12/01/2015   CALCIUM 9.9 08/22/2015   CALCIUM 9.2 06/03/2015   CALCIUM 9.6 02/17/2015   CALCIUM 9.2 07/27/2014   CALCIUM 9.9 07/22/2014   CALCIUM 9.4 01/11/2014   No h/o thyrotoxicosis. Reviewed TSH recent levels:  Lab Results  Component Value Date   TSH 1.22 12/18/2012   TSH 2.30 11/01/2011   TSH 1.64 12/20/2009   TSH 7.332  12/15/2009   TSH 2.565 11/01/2006   No h/o CKD. Last BUN/Cr: Lab Results   Component Value Date   BUN 12 08/23/2016   CREATININE 0.74 08/23/2016  She also has a history of mild hypertension, GERD, asthma, glaucoma   She has a recent allergy to nuts.  ROS: Constitutional: no weight gain/loss, no fatigue, no subjective hyperthermia/hypothermia Eyes: no blurry vision, no xerophthalmia ENT: no sore throat, no nodules palpated in throat, no dysphagia/odynophagia, no hoarseness Cardiovascular: no CP/SOB/palpitations/leg swelling Respiratory: no cough/SOB Gastrointestinal: no N/V/D/C Musculoskeletal: + muscle/+ joint aches Skin: no rashes Neurological: no tremors/numbness/tingling/dizziness Psychiatric: no depression/anxiety  Past Medical History:  Diagnosis Date  . Asthma   . CAP (community acquired pneumonia) 08/2012   Dr Birdie Riddle  . Diverticulitis   . Diverticulosis   . Fasting hyperglycemia 04/2011   FBS 108  . Fibromyalgia   . Fundic gland polyps of stomach, benign   . GERD (gastroesophageal reflux disease)    gastric polyp x3  . Glaucoma     Dr Bing Plume  . IBS (irritable bowel syndrome)   . Osteopenia    last 07/2011  . Pneumonia     OP as child  . Shingles    Past Surgical History:  Procedure Laterality Date  . COLONOSCOPY W/ POLYPECTOMY  [phone]   last  colonoscopy 2007, Dr Carlean Purl  .  ESOPHAGOGASTRODUODENOSCOPY    . SPINE SURGERY     T9-L5 fusions   Social History   Social History  . Marital status: Married    Spouse name: N/A  . Number of children: 1   Occupational History  . homemaker   Social History Main Topics  . Smoking status: Former Smoker    Packs/day: 0.50    Years: 10.00    Types: Cigarettes    Quit date: 09/10/1974  . Smokeless tobacco: Never Used     Comment: smoked 1966- 1976, up to 1 ppd  . Alcohol use No  . Drug use: No  . Sexual activity: Yes   Social History Narrative   Daily caffeine 3 cups   Regular exercise   Married   Current Outpatient Prescriptions on File Prior to Visit  Medication Sig  Dispense Refill  . acetaminophen (TYLENOL) 500 MG tablet Take 500 mg by mouth as needed.    Marland Kitchen albuterol (PROVENTIL HFA;VENTOLIN HFA) 108 (90 Base) MCG/ACT inhaler Inhale 2 puffs into the lungs every 6 (six) hours as needed for wheezing or shortness of breath. As needed only 1 Inhaler 3  . aspirin EC 81 MG tablet Take 81 mg by mouth 3 (three) times a week.    . bifidobacterium infantis (ALIGN) capsule Take 1 capsule by mouth daily. 21 capsule 0  . BLACK ELDERBERRY,BERRY-FLOWER, PO Take by mouth. Reported on 03/15/2016    . carboxymethylcellulose (REFRESH PLUS) 0.5 % SOLN 1 drop 3 (three) times daily as needed.    . cetirizine (ZYRTEC) 10 MG tablet Take 10 mg by mouth as needed. Reported on 03/15/2016    . EPINEPHrine (EPI-PEN) 0.3 mg/0.3 mL DEVI Inject 0.3 mLs (0.3 mg total) into the muscle once. 1 Device 0  . esomeprazole (NEXIUM) 40 MG capsule Take 40 mg by mouth as needed. Reported on 03/15/2016    . Flaxseed, Linseed, (GNP FLAX SEED OIL PO) Take 1 mL by mouth daily.    . fluticasone (FLONASE) 50 MCG/ACT nasal spray INSTILL 2 SPRAYS IN EACH NOSTRIL ONCE DAILY 48 g 3  . GuaiFENesin (MUCINEX PO) Take by mouth as needed. Reported on 03/15/2016    . hydrOXYzine (ATARAX/VISTARIL) 10 MG tablet Take 1 tablet (10 mg total) by mouth 3 (three) times daily as needed. 30 tablet 0  . hyoscyamine (LEVSIN SL) 0.125 MG SL tablet DISSOLVE 1 TABLET UNDER THE TONGUE EVERY 6 HOURS AS NEEDED FOR ABDOMINAL PAIN 385 tablet 0  . metoprolol tartrate (LOPRESSOR) 25 MG tablet TAKE 1 TABLET BY MOUTH TWO  TIMES DAILY AND AN  ADDITIONAL TABLET ONCE  DAILY AS NEEDED 270 tablet 1  . montelukast (SINGULAIR) 10 MG tablet Take 1 tablet by mouth  daily 90 tablet 3  . Multiple Minerals-Vitamins (CALCIUM CITRATE PLUS/MAGNESIUM PO) Take by mouth.    . nystatin cream (MYCOSTATIN) Apply 1 application topically 2 (two) times daily. 90 g 3  . Omega-3 Fatty Acids (FISH OIL PO) Take by mouth.    Marland Kitchen omeprazole (PRILOSEC) 40 MG capsule TAKE 1  CAPSULE BY MOUTH  DAILY 90 capsule 0  . predniSONE (DELTASONE) 10 MG tablet 4 tab po day 1, 3 tab po day 2, 2 tab po day 3, 1 tab po day 4 10 tablet 0  . ranitidine (ZANTAC) 150 MG tablet Take 150 mg by mouth at bedtime.     . simvastatin (ZOCOR) 20 MG tablet Take 1 tablet (20 mg total) by mouth daily. 30 tablet 2  . sodium chloride (OCEAN) 0.65 %  SOLN nasal spray Place 1 spray into both nostrils as needed for congestion.    . travoprost, benzalkonium, (TRAVATAN) 0.004 % ophthalmic solution Place 1 drop into both eyes at bedtime.    . triamcinolone cream (KENALOG) 0.1 % Apply 1 application topically 2 (two) times daily. 90 g 3  . Vitamin Mixture (ESTER-C PO) Take 1,000 mg by mouth daily.    . colchicine 0.6 MG tablet Take 1 tablet (0.6 mg total) by mouth 2 (two) times daily. 1 po bid x 2-3 days then take 1 a day (Patient not taking: Reported on 09/13/2016) 30 tablet 0  . mupirocin ointment (BACTROBAN) 2 % Apply to red areas and warm area twice daily (Patient not taking: Reported on 09/13/2016) 22 g 0   No current facility-administered medications on file prior to visit.    Allergies  Allergen Reactions  . Cephalosporins     Blisters  orally  . Gabapentin     REACTION: rash  . Levofloxacin     REACTION: stomach ache and rash  . Psyllium     REACTION: rash  . Sulfonamide Derivatives     REACTION: shock, urticaria  . Aspirin     REACTION: stomach pain  . Belladonna   . Ciprofloxacin Hcl   . Conjugated Estrogens   . Doxycycline     Abdominal pain  . Nabumetone   . Nsaids   . Soybean-Containing Drug Products Other (See Comments)    GI upset  . Zicam Cold Remedy [Erysidoron #1]   . Zocor [Simvastatin] Itching  . Prednisone Rash    NO PROBLEM WITH MEDROL DOSE PAK Oral prednisone caused facial burning, "made my face feel like it was on fire"   Family History  Problem Relation Age of Onset  . Breast cancer Mother   . Breast cancer Sister   . Breast cancer Maternal Aunt     two  .  Breast cancer Paternal Aunt   . Asthma Paternal Aunt   . Heart failure Father     CHF  . Kidney disease Maternal Grandmother   . Ovarian cancer      Niece x2  . Breast cancer      niece  . Coronary artery disease Paternal Aunt     triple CABG  . Heart attack Paternal Grandmother     MI in late 31s  . Heart disease Paternal Grandfather   . Heart attack Paternal Uncle      MI in 2s  . Heart disease Maternal Aunt   . Pulmonary embolism Son   . Deep vein thrombosis Son   . Diabetes Neg Hx   . Stroke Neg Hx   . COPD Neg Hx   . Colon polyps Neg Hx     PE: BP (!) 150/72   Pulse 60   Resp 16   Ht 5' 1.5" (1.562 m)   Wt 157 lb 2 oz (71.3 kg)   SpO2 98%   BMI 29.21 kg/m  Wt Readings from Last 3 Encounters:  01/04/17 157 lb 2 oz (71.3 kg)  09/13/16 158 lb 6.4 oz (71.8 kg)  08/23/16 158 lb 9.6 oz (71.9 kg)   Constitutional: overweight, in NAD. No kyphosis. Eyes: PERRLA, EOMI, no exophthalmos ENT: moist mucous membranes, no thyromegaly, no cervical lymphadenopathy Cardiovascular: RRR, No MRG Respiratory: CTA B Gastrointestinal: abdomen soft, NT, ND, BS+ Musculoskeletal: no deformities, strength intact in all 4 Skin: moist, warm, no rashes Neurological: no tremor with outstretched hands, DTR normal in all 4  Assessment: 1. Osteoporosis  Plan: 1. Osteoporosis - likely postmenopausal +  has FH of OP - Discussed about increased risk of fracture, depending on the T score, greatly increased when the T score is lower than -2.5, but it is actually a continuum and -2.5 should not be regarded as an absolute threshold. We reviewed her last 2 DEXA scan reports and images together, and I explained that based on the T scores, she has an increased risk for fractures. However, based on the last DEXA scan, her scores have decreased in the last 2 years. Her lumbar spine could not be analyzed because of previous surgery, however, I explained that the ultra distal radius is usually used as a  surrogate for the trabecular bone BMD . In 2015, this was -3.9 which is very low. I do not have this report from 2018. However, I do not expect this to be significantly improved, since the only change that she implemented was to get 1 dose of Prolia in 2016, after which she interrupted the treatment completed. I explained that the effect of Prolia is short lived, if not followed by another medication, so in the long-term, she did not get any benefit from that injection.  - we reviewed her dietary and supplemental calcium and vitamin D intake. I recommended to make sure she gets 1000-1200 mg of calcium daily and I will check vit D today to see if she needs  extra supplementation - given her specific instructions about food sources for Calcium and Vitamin D - see pt instructions  - discussed fall precautions   - given handout from [facility] Re: weight bearing exercises - advised to do this every day or at least 5/7 days -I also suggested that she looked into [facility] -explained the benefits of using skeletal loading - we discussed about maintaining a good amount of protein in her diet. The recommended daily protein intake is ~0.8 g per kilogram per day (~50g per day). I advised her to try to aim for this amount, since a diet low in proteins can exacerbate osteoporosis. Also, avoid smoking or >2 drinks of alcohol a day. - We discussed about the different medication classes, benefits and side effects (including atypical fractures and ONJ - no dental workup in progress or planned).  - I explained that, since she has a very low bone mineral density in the level of the spine and also she has GERD , I would not use oral bisphosphonates, so my first choice would be sq denosumab (Prolia) for 3-6 or even 10 years years, then zoledronic acid (iv Reclast) for 1-2 years. I would use Teriparatide as a last resort. Pt was given reading information about Prolia, and I explained the mechanism  of action and expected benefits.  - if labs normal, will arrange for a Prolia inj - will check a new DEXA scan in 2 years after starting Prolia -  I explained that the first indication that the treatment is working is her not having anymore fractures. DEXA scan changes are secondary: unchanged or slightly higher T-scores are desirable - will see pt back in a year  Component     Latest Ref Rng & Units 01/04/2017  Sodium     135 - 146 mmol/L 140  Potassium     3.5 - 5.3 mmol/L 5.4 (H)  Chloride     98 - 110 mmol/L 102  CO2     20 - 31 mmol/L 26  Glucose  65 - 99 mg/dL 86  BUN     7 - 25 mg/dL 13  Creatinine     0.60 - 0.93 mg/dL 0.78  Calcium     8.6 - 10.4 mg/dL 9.9  GFR, Est African American     >=60 mL/min 88  GFR, Est Non African American     >=60 mL/min 77  VITD     30.00 - 100.00 ng/mL 38.91   Potassium slightly high, the rest of the labs are normal. We will start the Prolia PA.  CC:  Dr. Gavin Pound  Philemon Kingdom, MD PhD [facility] Endocrinology

## 2017-01-18 DIAGNOSIS — H2513 Age-related nuclear cataract, bilateral: Secondary | ICD-10-CM | POA: Diagnosis not present

## 2017-01-18 DIAGNOSIS — H04123 Dry eye syndrome of bilateral lacrimal glands: Secondary | ICD-10-CM | POA: Diagnosis not present

## 2017-01-18 DIAGNOSIS — H401132 Primary open-angle glaucoma, bilateral, moderate stage: Secondary | ICD-10-CM | POA: Diagnosis not present

## 2017-01-24 ENCOUNTER — Other Ambulatory Visit (INDEPENDENT_AMBULATORY_CARE_PROVIDER_SITE_OTHER): Payer: Medicare Other

## 2017-01-24 ENCOUNTER — Telehealth: Payer: Self-pay | Admitting: Family Medicine

## 2017-01-24 DIAGNOSIS — E785 Hyperlipidemia, unspecified: Secondary | ICD-10-CM | POA: Diagnosis not present

## 2017-01-24 DIAGNOSIS — I10 Essential (primary) hypertension: Secondary | ICD-10-CM | POA: Diagnosis not present

## 2017-01-24 LAB — COMPREHENSIVE METABOLIC PANEL
ALT: 19 U/L (ref 0–35)
AST: 22 U/L (ref 0–37)
Albumin: 4.5 g/dL (ref 3.5–5.2)
Alkaline Phosphatase: 87 U/L (ref 39–117)
BUN: 16 mg/dL (ref 6–23)
CO2: 29 meq/L (ref 19–32)
Calcium: 9.9 mg/dL (ref 8.4–10.5)
Chloride: 103 mEq/L (ref 96–112)
Creatinine, Ser: 0.79 mg/dL (ref 0.40–1.20)
GFR: 76.17 mL/min (ref >60.00)
GLUCOSE: 89 mg/dL (ref 70–99)
POTASSIUM: 5.3 meq/L — AB (ref 3.5–5.1)
SODIUM: 138 meq/L (ref 135–145)
TOTAL PROTEIN: 7.1 g/dL (ref 6.0–8.3)
Total Bilirubin: 0.6 mg/dL (ref 0.2–1.2)

## 2017-01-24 LAB — LIPID PANEL
CHOL/HDL RATIO: 3
Cholesterol: 220 mg/dL — ABNORMAL HIGH (ref 0–200)
HDL: 71.9 mg/dL (ref >39.00)
LDL Cholesterol: 132 mg/dL — ABNORMAL HIGH (ref 0–99)
NONHDL: 148.52
Triglycerides: 82 mg/dL (ref 0.0–149.0)
VLDL: 16.4 mg/dL (ref 0.0–40.0)

## 2017-01-25 NOTE — Telephone Encounter (Signed)
error:[id number] ° °

## 2017-01-30 ENCOUNTER — Telehealth: Payer: Self-pay | Admitting: Internal Medicine

## 2017-01-30 ENCOUNTER — Telehealth: Payer: Self-pay

## 2017-01-30 NOTE — Telephone Encounter (Signed)
Called and advised patient of Prolia, advised with MD to see what other options are available

## 2017-01-30 NOTE — Telephone Encounter (Signed)
See message. I reviewed the patient's prolia account and on 11/15/2016 the summary of benefits came back and stated no PA is needed and the estimated cost will be between 220$ and 240$. This is an estimated cost and cannot be verified until the prolia shot is received.

## 2017-01-30 NOTE — Telephone Encounter (Signed)
Patient called in to make sure no one has called to let her know about her RX Prolia. Patient has been getting a lot of "scam" calls on home phone and wanted to confirm no one has tried to get in touch. Please call patient and advise.

## 2017-01-30 NOTE — Telephone Encounter (Signed)
Patient states that she does not think that she can do this. What would be another option?  Please advise thank you!

## 2017-01-31 NOTE — Telephone Encounter (Signed)
Kayla [name], can you check if we can use Reclast for her? I will also FWD this to [facility].

## 2017-01-31 NOTE — Telephone Encounter (Signed)
Can you please look into this and let me know how I can help. Thank you!

## 2017-01-31 NOTE — Telephone Encounter (Signed)
[place], She will have to look to see if this is an option. Thank you!

## 2017-02-05 ENCOUNTER — Other Ambulatory Visit: Payer: Self-pay | Admitting: Family Medicine

## 2017-02-05 ENCOUNTER — Ambulatory Visit: Payer: Medicare Other | Admitting: Family Medicine

## 2017-02-05 DIAGNOSIS — I10 Essential (primary) hypertension: Secondary | ICD-10-CM

## 2017-02-22 ENCOUNTER — Ambulatory Visit: Payer: Medicare Other | Admitting: Family Medicine

## 2017-03-05 ENCOUNTER — Ambulatory Visit: Payer: Medicare Other | Admitting: Family Medicine

## 2017-03-05 ENCOUNTER — Encounter: Payer: Self-pay | Admitting: Family Medicine

## 2017-03-05 ENCOUNTER — Ambulatory Visit (INDEPENDENT_AMBULATORY_CARE_PROVIDER_SITE_OTHER): Payer: Medicare Other | Admitting: Family Medicine

## 2017-03-05 DIAGNOSIS — E785 Hyperlipidemia, unspecified: Secondary | ICD-10-CM

## 2017-03-05 DIAGNOSIS — I10 Essential (primary) hypertension: Secondary | ICD-10-CM

## 2017-03-05 NOTE — Patient Instructions (Signed)

## 2017-03-05 NOTE — Progress Notes (Signed)
Patient ID: Kayla [name], female   DOB: 1946/02/08, 71 y.o.   MRN: [id number]     Subjective:  I acted as a Education administrator for Dr. Carollee Herter.  Guerry Bruin, [place]   Patient ID: Kayla [name], female    DOB: Jan 27, 1946, 71 y.o.   MRN: [id number]  Chief Complaint  Patient presents with  . Hypertension  . Hyperlipidemia    HPI  Patient is in today for follow up blood pressure and cholesterol.  Patient Care Team: Carollee Herter, Alferd Apa, DO as PCP - General (Family Medicine) Hendricks Limes, MD as Referring Physician (Internal Medicine) Gatha Mayer, MD as Consulting Physician (Gastroenterology) Calvert Cantor, MD as Consulting Physician (Ophthalmology) Beshears, Dorie Rank, DMD (Dentistry) Minus Breeding, MD as Consulting Physician (Cardiology) Izora Gala, MD as Consulting Physician (Otolaryngology)   Past Medical History:  Diagnosis Date  . Asthma   . CAP (community acquired pneumonia) 08/2012   Dr Birdie Riddle  . Diverticulitis   . Diverticulosis   . Fasting hyperglycemia 04/2011   FBS 108  . Fibromyalgia   . Fundic gland polyps of stomach, benign   . GERD (gastroesophageal reflux disease)    gastric polyp x3  . Glaucoma     Dr Bing Plume  . IBS (irritable bowel syndrome)   . Osteopenia    last 07/2011  . Pneumonia     OP as child  . Shingles     Past Surgical History:  Procedure Laterality Date  . COLONOSCOPY W/ POLYPECTOMY  [phone]   last  colonoscopy 2007, Dr Carlean Purl  . ESOPHAGOGASTRODUODENOSCOPY    . SPINE SURGERY     T9-L5 fusions    Family History  Problem Relation Age of Onset  . Breast cancer Mother   . Breast cancer Sister   . Breast cancer Maternal Aunt        two  . Breast cancer Paternal Aunt   . Asthma Paternal Aunt   . Heart failure Father        CHF  . Kidney disease Maternal Grandmother   . Ovarian cancer Unknown        Niece x2  . Breast cancer Unknown        niece  . Coronary artery disease Paternal Aunt        triple CABG  .  Heart attack Paternal Grandmother        MI in late 47s  . Heart disease Paternal Grandfather   . Heart attack Paternal Uncle         MI in 16s  . Heart disease Maternal Aunt   . Pulmonary embolism Son   . Deep vein thrombosis Son   . Diabetes Neg Hx   . Stroke Neg Hx   . COPD Neg Hx   . Colon polyps Neg Hx     Social History   Social History  . Marital status: Married    Spouse name: N/A  . Number of children: N/A  . Years of education: N/A   Occupational History  . Not on file.   Social History Main Topics  . Smoking status: Former Smoker    Packs/day: 0.50    Years: 10.00    Types: Cigarettes    Quit date: 09/10/1974  . Smokeless tobacco: Never Used     Comment: smoked 1966- 1976, up to 1 ppd  . Alcohol use No  . Drug use: No  . Sexual activity: Yes   Other Topics Concern  . Not on file  Social History Narrative   Daily caffeine 3 cups   Regular exercise   Married          Outpatient Medications Prior to Visit  Medication Sig Dispense Refill  . acetaminophen (TYLENOL) 500 MG tablet Take 500 mg by mouth as needed.    Marland Kitchen albuterol (PROVENTIL HFA;VENTOLIN HFA) 108 (90 Base) MCG/ACT inhaler Inhale 2 puffs into the lungs every 6 (six) hours as needed for wheezing or shortness of breath. As needed only 1 Inhaler 3  . bifidobacterium infantis (ALIGN) capsule Take 1 capsule by mouth daily. 21 capsule 0  . BLACK ELDERBERRY,BERRY-FLOWER, PO Take by mouth. Reported on 03/15/2016    . carboxymethylcellulose (REFRESH PLUS) 0.5 % SOLN 1 drop 3 (three) times daily as needed.    . cetirizine (ZYRTEC) 10 MG tablet Take 10 mg by mouth as needed. Reported on 03/15/2016    . colchicine 0.6 MG tablet Take 1 tablet (0.6 mg total) by mouth 2 (two) times daily. 1 po bid x 2-3 days then take 1 a day 30 tablet 0  . EPINEPHrine (EPI-PEN) 0.3 mg/0.3 mL DEVI Inject 0.3 mLs (0.3 mg total) into the muscle once. 1 Device 0  . esomeprazole (NEXIUM) 40 MG capsule Take 40 mg by mouth as needed.  Reported on 03/15/2016    . fluticasone (FLONASE) 50 MCG/ACT nasal spray INSTILL 2 SPRAYS IN EACH NOSTRIL ONCE DAILY 48 g 3  . GuaiFENesin (MUCINEX PO) Take by mouth as needed. Reported on 03/15/2016    . hydrOXYzine (ATARAX/VISTARIL) 10 MG tablet Take 1 tablet (10 mg total) by mouth 3 (three) times daily as needed. 30 tablet 0  . hyoscyamine (LEVSIN SL) 0.125 MG SL tablet DISSOLVE 1 TABLET UNDER THE TONGUE EVERY 6 HOURS AS NEEDED FOR ABDOMINAL PAIN 385 tablet 0  . metoprolol tartrate (LOPRESSOR) 25 MG tablet TAKE 1 TABLET BY MOUTH TWO  TIMES DAILY AND AN  ADDITIONAL TABLET ONCE  DAILY AS NEEDED 270 tablet 1  . montelukast (SINGULAIR) 10 MG tablet Take 1 tablet by mouth  daily 90 tablet 3  . Multiple Minerals-Vitamins (CALCIUM CITRATE PLUS/MAGNESIUM PO) Take by mouth.    . nystatin cream (MYCOSTATIN) Apply 1 application topically 2 (two) times daily. 90 g 3  . Omega-3 Fatty Acids (FISH OIL PO) Take by mouth.    Marland Kitchen omeprazole (PRILOSEC) 40 MG capsule TAKE 1 CAPSULE BY MOUTH  DAILY 90 capsule 0  . ranitidine (ZANTAC) 150 MG tablet Take 150 mg by mouth at bedtime.     . sodium chloride (OCEAN) 0.65 % SOLN nasal spray Place 1 spray into both nostrils as needed for congestion.    . travoprost, benzalkonium, (TRAVATAN) 0.004 % ophthalmic solution Place 1 drop into both eyes at bedtime.    . triamcinolone cream (KENALOG) 0.1 % Apply 1 application topically 2 (two) times daily. 90 g 3  . Vitamin Mixture (ESTER-C PO) Take 1,000 mg by mouth daily.    . Cholecalciferol (VITAMIN D3) 10000 units TABS Take 1 tablet by mouth daily.    . Flaxseed, Linseed, (GNP FLAX SEED OIL PO) Take 1 mL by mouth daily.    . predniSONE (DELTASONE) 10 MG tablet 4 tab po day 1, 3 tab po day 2, 2 tab po day 3, 1 tab po day 4 10 tablet 0  . simvastatin (ZOCOR) 20 MG tablet Take 1 tablet (20 mg total) by mouth daily. 30 tablet 2   No facility-administered medications prior to visit.     Allergies  Allergen Reactions  .  Cephalosporins     Blisters  orally  . Gabapentin     REACTION: rash  . Levofloxacin     REACTION: stomach ache and rash  . Psyllium     REACTION: rash  . Sulfonamide Derivatives     REACTION: shock, urticaria  . Aspirin     REACTION: stomach pain  . Belladonna   . Ciprofloxacin Hcl   . Conjugated Estrogens   . Doxycycline     Abdominal pain  . Nabumetone   . Nsaids   . Soybean-Containing Drug Products Other (See Comments)    GI upset  . Zicam Cold Remedy [Erysidoron #1]   . Zocor [Simvastatin] Itching  . Prednisone Rash    NO PROBLEM WITH MEDROL DOSE PAK Oral prednisone caused facial burning, "made my face feel like it was on fire"    Review of Systems  Constitutional: Negative for fever and malaise/fatigue.  HENT: Negative for congestion.   Eyes: Negative for blurred vision.  Respiratory: Negative for cough and shortness of breath.   Cardiovascular: Negative for chest pain, palpitations and leg swelling.  Gastrointestinal: Negative for vomiting.  Musculoskeletal: Negative for back pain.  Skin: Negative for rash.  Neurological: Negative for loss of consciousness and headaches.       Objective:    Physical Exam  Constitutional: She is oriented to person, place, and time. She appears well-developed and well-nourished.  HENT:  Head: Normocephalic and atraumatic.  Eyes: Conjunctivae and EOM are normal.  Neck: Normal range of motion. Neck supple. No JVD present. Carotid bruit is not present. No thyromegaly present.  Cardiovascular: Normal rate, regular rhythm and normal heart sounds.   No murmur heard. Pulmonary/Chest: Effort normal and breath sounds normal. No respiratory distress. She has no wheezes. She has no rales. She exhibits no tenderness.  Musculoskeletal: She exhibits no edema.  Neurological: She is alert and oriented to person, place, and time.  Psychiatric: She has a normal mood and affect. Her behavior is normal. Judgment and thought content normal.    Nursing note and vitals reviewed.   BP 130/70 (BP Location: Left Arm, Cuff Size: Normal)   Pulse 60   Temp 98.7 F (37.1 C) (Oral)   Resp 16   Ht 5\' 2"  (1.575 m)   Wt 157 lb 9.6 oz (71.5 kg)   SpO2 98%   BMI 28.83 kg/m  Wt Readings from Last 3 Encounters:  03/05/17 157 lb 9.6 oz (71.5 kg)  01/04/17 157 lb 2 oz (71.3 kg)  09/13/16 158 lb 6.4 oz (71.8 kg)   BP Readings from Last 3 Encounters:  03/05/17 130/70  01/04/17 (!) 150/72  09/13/16 (!) 148/54     Immunization History  Administered Date(s) Administered  . Influenza Whole 06/14/2008, 06/09/2010, 07/06/2011  . Influenza, High Dose Seasonal PF 06/10/2014, 05/26/2015, 05/24/2016  . Influenza, Seasonal, Injecte, Preservative Fre 05/26/2013  . Influenza-Unspecified 05/30/2012, 05/22/2013  . Pneumococcal Conjugate-13 01/11/2014  . Pneumococcal Polysaccharide-23 07/12/2011  . Td 03/07/2006  . Tdap 02/17/2015  . Zoster 08/19/2013    Health Maintenance  Topic Date Due  . INFLUENZA VACCINE  04/10/2017  . MAMMOGRAM  10/03/2018  . TETANUS/TDAP  02/16/2025  . COLONOSCOPY  03/15/2026  . DEXA SCAN  Completed  . Hepatitis C Screening  Completed  . PNA vac Low Risk Adult  Completed    Lab Results  Component Value Date   WBC 4.3 05/24/2016   HGB 13.2 05/24/2016   HCT 38.7 05/24/2016  PLT 167.0 05/24/2016   GLUCOSE 89 01/24/2017   CHOL 220 (H) 01/24/2017   TRIG 82.0 01/24/2017   HDL 71.90 01/24/2017   LDLDIRECT 153.3 12/18/2012   LDLCALC 132 (H) 01/24/2017   ALT 19 01/24/2017   AST 22 01/24/2017   NA 138 01/24/2017   K 5.3 (H) 01/24/2017   CL 103 01/24/2017   CREATININE 0.79 01/24/2017   BUN 16 01/24/2017   CO2 29 01/24/2017   TSH 1.22 12/18/2012   INR 0.97 12/15/2009   HGBA1C 5.4 11/01/2011    Lab Results  Component Value Date   TSH 1.22 12/18/2012   Lab Results  Component Value Date   WBC 4.3 05/24/2016   HGB 13.2 05/24/2016   HCT 38.7 05/24/2016   MCV 90.3 05/24/2016   PLT 167.0 05/24/2016    Lab Results  Component Value Date   NA 138 01/24/2017   K 5.3 (H) 01/24/2017   CO2 29 01/24/2017   GLUCOSE 89 01/24/2017   BUN 16 01/24/2017   CREATININE 0.79 01/24/2017   BILITOT 0.6 01/24/2017   ALKPHOS 87 01/24/2017   AST 22 01/24/2017   ALT 19 01/24/2017   PROT 7.1 01/24/2017   ALBUMIN 4.5 01/24/2017   CALCIUM 9.9 01/24/2017   GFR 76.17 01/24/2017   Lab Results  Component Value Date   CHOL 220 (H) 01/24/2017   Lab Results  Component Value Date   HDL 71.90 01/24/2017   Lab Results  Component Value Date   LDLCALC 132 (H) 01/24/2017   Lab Results  Component Value Date   TRIG 82.0 01/24/2017   Lab Results  Component Value Date   CHOLHDL 3 01/24/2017   Lab Results  Component Value Date   HGBA1C 5.4 11/01/2011         Assessment & Plan:   Problem List Items Addressed This Visit      Unprioritized   Hyperlipidemia    Encouraged heart healthy diet, increase exercise, avoid trans fats, consider a krill oil cap daily      Hypertension    Well controlled, no changes to meds. Encouraged heart healthy diet such as the DASH diet and exercise as tolerated.           I have discontinued Ms. Steuck's (Flaxseed, Linseed, (GNP FLAX SEED OIL PO)), simvastatin, predniSONE, and Vitamin D3. I am also having her maintain her GuaiFENesin (MUCINEX PO), EPINEPHrine, ranitidine, acetaminophen, cetirizine, esomeprazole, bifidobacterium infantis, travoprost (benzalkonium), Omega-3 Fatty Acids (FISH OIL PO), Multiple Minerals-Vitamins (CALCIUM CITRATE PLUS/MAGNESIUM PO), nystatin cream, (BLACK ELDERBERRY,BERRY-FLOWER, PO), sodium chloride, hyoscyamine, carboxymethylcellulose, triamcinolone cream, montelukast, colchicine, fluticasone, omeprazole, Vitamin Mixture (ESTER-C PO), hydrOXYzine, albuterol, metoprolol tartrate, cholecalciferol, and (Flaxseed, Linseed, (GROUND FLAX SEEDS PO)).  Meds ordered this encounter  Medications  . cholecalciferol (VITAMIN D) 1000 units tablet     Sig: Take 1 capsule by mouth daily.  . Flaxseed, Linseed, (GROUND FLAX SEEDS PO)    Sig: Take 1 to 1 1/2  Tablespoonful daily    CMA served as scribe during this visit. History, Physical and Plan performed by medical provider. Documentation and orders reviewed and attested to.  [name], DO

## 2017-03-06 NOTE — Assessment & Plan Note (Addendum)
Encouraged heart healthy diet, increase exercise, avoid trans fats, consider a krill oil cap daily 

## 2017-03-06 NOTE — Assessment & Plan Note (Signed)
Well controlled, no changes to meds. Encouraged heart healthy diet such as the DASH diet and exercise as tolerated.  °

## 2017-03-24 ENCOUNTER — Encounter: Payer: Self-pay | Admitting: Family Medicine

## 2017-03-25 MED ORDER — MONTELUKAST SODIUM 10 MG PO TABS: 10.0000 mg | ORAL_TABLET | Freq: Every day | ORAL | 6 refills | Status: DC

## 2017-04-22 NOTE — Telephone Encounter (Signed)
Patient is not wanting to do the Reclast, since it will be in her system for a year, and is afraid of side effects.   She is wanting to do the Prolia, but can not afford it right now.

## 2017-05-21 ENCOUNTER — Ambulatory Visit (HOSPITAL_BASED_OUTPATIENT_CLINIC_OR_DEPARTMENT_OTHER)
Admission: RE | Admit: 2017-05-21 | Discharge: 2017-05-21 | Disposition: A | Discharge status: Home / Self Care | Payer: Medicare Other | Source: Ambulatory Visit | Attending: Family | Admitting: Family

## 2017-05-21 ENCOUNTER — Encounter: Payer: Self-pay | Admitting: Family

## 2017-05-21 ENCOUNTER — Ambulatory Visit (INDEPENDENT_AMBULATORY_CARE_PROVIDER_SITE_OTHER): Payer: Medicare Other | Admitting: Family

## 2017-05-21 VITALS — BP 142/64 | HR 61 | Temp 99.1°F | Resp 16 | Ht 62.0 in | Wt 158.6 lb

## 2017-05-21 DIAGNOSIS — R05 Cough: Secondary | ICD-10-CM | POA: Diagnosis not present

## 2017-05-21 DIAGNOSIS — J019 Acute sinusitis, unspecified: Secondary | ICD-10-CM

## 2017-05-21 DIAGNOSIS — J029 Acute pharyngitis, unspecified: Secondary | ICD-10-CM

## 2017-05-21 DIAGNOSIS — R918 Other nonspecific abnormal finding of lung field: Secondary | ICD-10-CM | POA: Insufficient documentation

## 2017-05-21 DIAGNOSIS — R0989 Other specified symptoms and signs involving the circulatory and respiratory systems: Secondary | ICD-10-CM

## 2017-05-21 DIAGNOSIS — J40 Bronchitis, not specified as acute or chronic: Secondary | ICD-10-CM | POA: Diagnosis not present

## 2017-05-21 LAB — POCT RAPID STREP A (OFFICE): RAPID STREP A SCREEN: NEGATIVE

## 2017-05-21 MED ORDER — AMOXICILLIN-POT CLAVULANATE 875-125 MG PO TABS: 1.0000 | ORAL_TABLET | Freq: Two times a day (BID) | ORAL | 0 refills | Status: DC

## 2017-05-21 NOTE — Patient Instructions (Signed)
You may use mucinex 600mg  twice daily as needed for chest congestion. Start augmentin. Call if new/worsening symptoms or if not improved in 3 days.

## 2017-05-21 NOTE — Progress Notes (Signed)
Subjective:    Patient ID: Kayla [name], female    DOB: 1946/06/08, 70 y.o.   MRN: [id number]  HPI   [name] is a 71 yr old female who presents today with chief complaint of sore throat. Reports that symptoms began with mild sore throat 10 days ago. Worsened on 05/18/17.  Had associated facial pressure/sinus pressure which is improved with use of mucinex.  Also having associated cough/congestion. Denies fever. Temp today is 99.1.    Review of Systems See HPI  Past Medical History:  Diagnosis Date  . Asthma   . CAP (community acquired pneumonia) 08/2012   Dr Birdie Riddle  . Diverticulitis   . Diverticulosis   . Fasting hyperglycemia 04/2011   FBS 108  . Fibromyalgia   . Fundic gland polyps of stomach, benign   . GERD (gastroesophageal reflux disease)    gastric polyp x3  . Glaucoma     Dr Bing Plume  . IBS (irritable bowel syndrome)   . Osteopenia    last 07/2011  . Pneumonia     OP as child  . Shingles      Social History   Social History  . Marital status: Married    Spouse name: N/A  . Number of children: N/A  . Years of education: N/A   Occupational History  . Not on file.   Social History Main Topics  . Smoking status: Former Smoker    Packs/day: 0.50    Years: 10.00    Types: Cigarettes    Quit date: 09/10/1974  . Smokeless tobacco: Never Used     Comment: smoked 1966- 1976, up to 1 ppd  . Alcohol use No  . Drug use: No  . Sexual activity: Yes   Other Topics Concern  . Not on file   Social History Narrative   Daily caffeine 3 cups   Regular exercise   Married          Past Surgical History:  Procedure Laterality Date  . COLONOSCOPY W/ POLYPECTOMY  [phone]   last  colonoscopy 2007, Dr Carlean Purl  . ESOPHAGOGASTRODUODENOSCOPY    . SPINE SURGERY     T9-L5 fusions    Family History  Problem Relation Age of Onset  . Breast cancer Mother   . Breast cancer Sister   . Breast cancer Maternal Aunt        two  . Breast cancer Paternal Aunt    . Asthma Paternal Aunt   . Heart failure Father        CHF  . Kidney disease Maternal Grandmother   . Ovarian cancer Unknown        Niece x2  . Breast cancer Unknown        niece  . Coronary artery disease Paternal Aunt        triple CABG  . Heart attack Paternal Grandmother        MI in late 62s  . Heart disease Paternal Grandfather   . Heart attack Paternal Uncle         MI in 29s  . Heart disease Maternal Aunt   . Pulmonary embolism Son   . Deep vein thrombosis Son   . Diabetes Neg Hx   . Stroke Neg Hx   . COPD Neg Hx   . Colon polyps Neg Hx     Allergies  Allergen Reactions  . Cephalosporins     Blisters  orally  . Gabapentin     REACTION: rash  .  Levofloxacin     REACTION: stomach ache and rash  . Psyllium     REACTION: rash  . Sulfonamide Derivatives     REACTION: shock, urticaria  . Aspirin     REACTION: stomach pain  . Belladonna   . Ciprofloxacin Hcl   . Conjugated Estrogens   . Doxycycline     Abdominal pain  . Nabumetone   . Nsaids   . Soybean-Containing Drug Products Other (See Comments)    GI upset  . Zicam Cold Remedy [Erysidoron #1]   . Zocor [Simvastatin] Itching  . Prednisone Rash    NO PROBLEM WITH MEDROL DOSE PAK Oral prednisone caused facial burning, "made my face feel like it was on fire"    Current Outpatient Prescriptions on File Prior to Visit  Medication Sig Dispense Refill  . acetaminophen (TYLENOL) 500 MG tablet Take 500 mg by mouth as needed.    Marland Kitchen albuterol (PROVENTIL HFA;VENTOLIN HFA) 108 (90 Base) MCG/ACT inhaler Inhale 2 puffs into the lungs every 6 (six) hours as needed for wheezing or shortness of breath. As needed only 1 Inhaler 3  . bifidobacterium infantis (ALIGN) capsule Take 1 capsule by mouth daily. 21 capsule 0  . BLACK ELDERBERRY,BERRY-FLOWER, PO Take by mouth. Reported on 03/15/2016    . carboxymethylcellulose (REFRESH PLUS) 0.5 % SOLN 1 drop 3 (three) times daily as needed.    . cholecalciferol (VITAMIN D) 1000  units tablet Take 1 capsule by mouth daily.    . colchicine 0.6 MG tablet Take 1 tablet (0.6 mg total) by mouth 2 (two) times daily. 1 po bid x 2-3 days then take 1 a day 30 tablet 0  . EPINEPHrine (EPI-PEN) 0.3 mg/0.3 mL DEVI Inject 0.3 mLs (0.3 mg total) into the muscle once. 1 Device 0  . esomeprazole (NEXIUM) 40 MG capsule Take 40 mg by mouth as needed. Reported on 03/15/2016    . Flaxseed, Linseed, (GROUND FLAX SEEDS PO) Take 1 to 1 1/2  Tablespoonful daily    . fluticasone (FLONASE) 50 MCG/ACT nasal spray INSTILL 2 SPRAYS IN EACH NOSTRIL ONCE DAILY 48 g 3  . GuaiFENesin (MUCINEX PO) Take by mouth as needed. Reported on 03/15/2016    . hydrOXYzine (ATARAX/VISTARIL) 10 MG tablet Take 1 tablet (10 mg total) by mouth 3 (three) times daily as needed. 30 tablet 0  . hyoscyamine (LEVSIN SL) 0.125 MG SL tablet DISSOLVE 1 TABLET UNDER THE TONGUE EVERY 6 HOURS AS NEEDED FOR ABDOMINAL PAIN 385 tablet 0  . metoprolol tartrate (LOPRESSOR) 25 MG tablet TAKE 1 TABLET BY MOUTH TWO  TIMES DAILY AND AN  ADDITIONAL TABLET ONCE  DAILY AS NEEDED 270 tablet 1  . montelukast (SINGULAIR) 10 MG tablet Take 1 tablet (10 mg total) by mouth daily. 30 tablet 6  . Multiple Minerals-Vitamins (CALCIUM CITRATE PLUS/MAGNESIUM PO) Take by mouth.    . nystatin cream (MYCOSTATIN) Apply 1 application topically 2 (two) times daily. 90 g 3  . Omega-3 Fatty Acids (FISH OIL PO) Take by mouth.    Marland Kitchen omeprazole (PRILOSEC) 40 MG capsule TAKE 1 CAPSULE BY MOUTH  DAILY 90 capsule 0  . ranitidine (ZANTAC) 150 MG tablet Take 150 mg by mouth at bedtime.     . sodium chloride (OCEAN) 0.65 % SOLN nasal spray Place 1 spray into both nostrils as needed for congestion.    . travoprost, benzalkonium, (TRAVATAN) 0.004 % ophthalmic solution Place 1 drop into both eyes at bedtime.    . triamcinolone cream (KENALOG) 0.1 %  Apply 1 application topically 2 (two) times daily. 90 g 3  . Vitamin Mixture (ESTER-C PO) Take 1,000 mg by mouth daily.     No  current facility-administered medications on file prior to visit.     BP (!) 142/64 (BP Location: Right Arm, Cuff Size: Normal)   Pulse 61   Temp 99.1 F (37.3 C) (Oral)   Resp 16   Ht 5\' 2"  (1.575 m)   Wt 158 lb 9.6 oz (71.9 kg)   SpO2 98%   BMI 29.01 kg/m       Objective:   Physical Exam  Constitutional: She is oriented to person, place, and time. She appears well-developed and well-nourished.  HENT:  Head: Normocephalic and atraumatic.  Right Ear: Tympanic membrane and ear canal normal.  Left Ear: Tympanic membrane and ear canal normal.  Mouth/Throat: Posterior oropharyngeal erythema present. No oropharyngeal exudate.  Cardiovascular: Normal rate, regular rhythm and normal heart sounds.   No murmur heard. Pulmonary/Chest: Effort normal. No respiratory distress. She has no wheezes.  Bilateral rhonchi noted  Lymphadenopathy:    She has no cervical adenopathy.  Neurological: She is alert and oriented to person, place, and time.  Skin: Skin is warm.  Psychiatric: She has a normal mood and affect. Her behavior is normal. Judgment and thought content normal.          Assessment & Plan:  Bronchitis with possible early sinusitis-  rx with augmetin. (pt has multiple drug allergies) Obtain cxr to rule out PNA.  Rapid strep negative. Pt advised as follows:  You may use mucinex 600mg  twice daily as needed for chest congestion. Start augmentin. Call if new/worsening symptoms or if not improved in 3 days.

## 2017-06-04 ENCOUNTER — Telehealth: Payer: Self-pay | Admitting: Family Medicine

## 2017-06-04 NOTE — Telephone Encounter (Signed)
Pt says that she was seen and after taking medication she felt better. Pt says that she is now starting to experience some of the same symptoms. Sore throat, congestion in her chest. Pt would like to be advised further.   Pt says that she is still taking Mucin ex and have used inhaler (Pro-air )   CB: [phone]

## 2017-06-04 NOTE — Telephone Encounter (Signed)
Needs re-evaluation in office please.

## 2017-06-05 NOTE — Telephone Encounter (Signed)
Spoke with pt. She states that she was feeling bad most of the day yesterday but finally "turned a corner" in the afternoon. Phlegm is mostly clear to white and has had less coughing today. Pt wants to hold off on appointment at this time and see if she is still improving tomorrow. Pt will call back for appointment if symptoms worsen in any way.

## 2017-06-06 ENCOUNTER — Encounter: Payer: Self-pay | Admitting: Family Medicine

## 2017-06-06 ENCOUNTER — Ambulatory Visit (INDEPENDENT_AMBULATORY_CARE_PROVIDER_SITE_OTHER): Payer: Medicare Other | Admitting: Family Medicine

## 2017-06-06 VITALS — BP 130/82 | HR 58 | Temp 98.2°F | Ht 62.0 in | Wt 159.0 lb

## 2017-06-06 DIAGNOSIS — J40 Bronchitis, not specified as acute or chronic: Secondary | ICD-10-CM

## 2017-06-06 MED ORDER — CLARITHROMYCIN ER 500 MG PO TB24: 1000.0000 mg | ORAL_TABLET | Freq: Every day | ORAL | 0 refills | Status: AC

## 2017-06-06 MED ORDER — METHYLPREDNISOLONE 4 MG PO TABS: ORAL_TABLET | ORAL | 0 refills | Status: DC

## 2017-06-06 MED ORDER — METHYLPREDNISOLONE ACETATE 40 MG/ML IJ SUSP
40.0000 mg | Freq: Once | INTRAMUSCULAR | Status: AC
  Administered 2017-06-06: 40 mg via INTRAMUSCULAR

## 2017-06-06 NOTE — Progress Notes (Signed)
Patient ID: Kayla [name], female    DOB: October 23, 1945  Age: 71 y.o. MRN: [id number]    Subjective:  Subjective  HPI Kayla [name] presents for con't cough-- she did get a lot better after the last ov with NP but a few days after she finished the augmentin.the symptoms returned.  + cough and wheeze + some sinus pressure.    Review of Systems  Constitutional: Negative for chills and fever.  HENT: Positive for congestion, postnasal drip, rhinorrhea and sinus pressure.   Respiratory: Positive for cough, chest tightness, shortness of breath and wheezing.   Cardiovascular: Negative for chest pain, palpitations and leg swelling.  Allergic/Immunologic: Negative for environmental allergies.    History Past Medical History:  Diagnosis Date  . Asthma   . CAP (community acquired pneumonia) 08/2012   Dr Birdie Riddle  . Diverticulitis   . Diverticulosis   . Fasting hyperglycemia 04/2011   FBS 108  . Fibromyalgia   . Fundic gland polyps of stomach, benign   . GERD (gastroesophageal reflux disease)    gastric polyp x3  . Glaucoma     Dr Bing Plume  . IBS (irritable bowel syndrome)   . Osteopenia    last 07/2011  . Pneumonia     OP as child  . Shingles     She has a past surgical history that includes Spine surgery; Colonoscopy w/ polypectomy (2637,8588,5027); and Esophagogastroduodenoscopy.   Her family history includes Asthma in her paternal aunt; Breast cancer in her maternal aunt, mother, paternal aunt, sister, and unknown relative; Coronary artery disease in her paternal aunt; Deep vein thrombosis in her son; Heart attack in her paternal grandmother and paternal uncle; Heart disease in her maternal aunt and paternal grandfather; Heart failure in her father; Kidney disease in her maternal grandmother; Ovarian cancer in her unknown relative; Pulmonary embolism in her son.She reports that she quit smoking about 42 years ago. Her smoking use included Cigarettes. She has a 5.00 pack-year smoking  history. She has never used smokeless tobacco. She reports that she does not drink alcohol or use drugs.  Current Outpatient Prescriptions on File Prior to Visit  Medication Sig Dispense Refill  . acetaminophen (TYLENOL) 500 MG tablet Take 500 mg by mouth as needed.    Marland Kitchen albuterol (PROVENTIL HFA;VENTOLIN HFA) 108 (90 Base) MCG/ACT inhaler Inhale 2 puffs into the lungs every 6 (six) hours as needed for wheezing or shortness of breath. As needed only 1 Inhaler 3  . bifidobacterium infantis (ALIGN) capsule Take 1 capsule by mouth daily. 21 capsule 0  . BLACK ELDERBERRY,BERRY-FLOWER, PO Take by mouth. Reported on 03/15/2016    . carboxymethylcellulose (REFRESH PLUS) 0.5 % SOLN 1 drop 3 (three) times daily as needed.    . cholecalciferol (VITAMIN D) 1000 units tablet Take 1 capsule by mouth daily.    . colchicine 0.6 MG tablet Take 1 tablet (0.6 mg total) by mouth 2 (two) times daily. 1 po bid x 2-3 days then take 1 a day 30 tablet 0  . EPINEPHrine (EPI-PEN) 0.3 mg/0.3 mL DEVI Inject 0.3 mLs (0.3 mg total) into the muscle once. 1 Device 0  . esomeprazole (NEXIUM) 40 MG capsule Take 40 mg by mouth as needed. Reported on 03/15/2016    . Flaxseed, Linseed, (GROUND FLAX SEEDS PO) Take 1 to 1 1/2  Tablespoonful daily    . fluticasone (FLONASE) 50 MCG/ACT nasal spray INSTILL 2 SPRAYS IN EACH NOSTRIL ONCE DAILY 48 g 3  . GuaiFENesin (MUCINEX PO) Take  by mouth as needed. Reported on 03/15/2016    . hydrOXYzine (ATARAX/VISTARIL) 10 MG tablet Take 1 tablet (10 mg total) by mouth 3 (three) times daily as needed. 30 tablet 0  . hyoscyamine (LEVSIN SL) 0.125 MG SL tablet DISSOLVE 1 TABLET UNDER THE TONGUE EVERY 6 HOURS AS NEEDED FOR ABDOMINAL PAIN 385 tablet 0  . loratadine (CLARITIN) 10 MG tablet Take 10 mg by mouth daily.    . metoprolol tartrate (LOPRESSOR) 25 MG tablet TAKE 1 TABLET BY MOUTH TWO  TIMES DAILY AND AN  ADDITIONAL TABLET ONCE  DAILY AS NEEDED 270 tablet 1  . montelukast (SINGULAIR) 10 MG tablet Take 1  tablet (10 mg total) by mouth daily. 30 tablet 6  . Multiple Minerals-Vitamins (CALCIUM CITRATE PLUS/MAGNESIUM PO) Take by mouth.    . nystatin cream (MYCOSTATIN) Apply 1 application topically 2 (two) times daily. 90 g 3  . Omega-3 Fatty Acids (FISH OIL PO) Take by mouth.    Marland Kitchen omeprazole (PRILOSEC) 40 MG capsule TAKE 1 CAPSULE BY MOUTH  DAILY 90 capsule 0  . ranitidine (ZANTAC) 150 MG tablet Take 150 mg by mouth at bedtime.     . sodium chloride (OCEAN) 0.65 % SOLN nasal spray Place 1 spray into both nostrils as needed for congestion.    . travoprost, benzalkonium, (TRAVATAN) 0.004 % ophthalmic solution Place 1 drop into both eyes at bedtime.    . triamcinolone cream (KENALOG) 0.1 % Apply 1 application topically 2 (two) times daily. 90 g 3  . Vitamin Mixture (ESTER-C PO) Take 1,000 mg by mouth daily.     No current facility-administered medications on file prior to visit.      Objective:  Objective  Physical Exam BP 130/82   Pulse (!) 58   Temp 98.2 F (36.8 C)   Ht 5\' 2"  (1.575 m)   Wt 159 lb (72.1 kg)   SpO2 98%   BMI 29.08 kg/m  Wt Readings from Last 3 Encounters:  06/06/17 159 lb (72.1 kg)  05/21/17 158 lb 9.6 oz (71.9 kg)  03/05/17 157 lb 9.6 oz (71.5 kg)     Lab Results  Component Value Date   WBC 4.3 05/24/2016   HGB 13.2 05/24/2016   HCT 38.7 05/24/2016   PLT 167.0 05/24/2016   GLUCOSE 89 01/24/2017   CHOL 220 (H) 01/24/2017   TRIG 82.0 01/24/2017   HDL 71.90 01/24/2017   LDLDIRECT 153.3 12/18/2012   LDLCALC 132 (H) 01/24/2017   ALT 19 01/24/2017   AST 22 01/24/2017   NA 138 01/24/2017   K 5.3 (H) 01/24/2017   CL 103 01/24/2017   CREATININE 0.79 01/24/2017   BUN 16 01/24/2017   CO2 29 01/24/2017   TSH 1.22 12/18/2012   INR 0.97 12/15/2009   HGBA1C 5.4 11/01/2011    Dg Chest 2 View  Result Date: 05/21/2017 CLINICAL DATA:  Cough and chest congestion for the past 10 days associated with low-grade fever. History of asthma, former smoker. EXAM: CHEST  2  VIEW COMPARISON:  Chest x-ray of May 24, 2016 FINDINGS: The lungs are adequately inflated. There is no focal infiltrate. The interstitial markings are mildly prominent though stable. The heart and pulmonary vascularity are normal. There is curvature of the thoracolumbar spine convex toward the left. Harrington rods are present. IMPRESSION: There is no acute cardiopulmonary abnormality. Mild interstitial prominence is consistent with known reactive airway disease. Electronically Signed   By: David  Martinique M.D.   On: 05/21/2017 15:48  Assessment & Plan:  Plan  I have discontinued Ms. Herdt's amoxicillin-clavulanate. I am also having her start on clarithromycin and methylPREDNISolone. Additionally, I am having her maintain her GuaiFENesin (MUCINEX PO), EPINEPHrine, ranitidine, acetaminophen, esomeprazole, bifidobacterium infantis, travoprost (benzalkonium), Omega-3 Fatty Acids (FISH OIL PO), Multiple Minerals-Vitamins (CALCIUM CITRATE PLUS/MAGNESIUM PO), nystatin cream, (BLACK ELDERBERRY,BERRY-FLOWER, PO), sodium chloride, hyoscyamine, carboxymethylcellulose, triamcinolone cream, colchicine, fluticasone, omeprazole, Vitamin Mixture (ESTER-C PO), hydrOXYzine, albuterol, metoprolol tartrate, cholecalciferol, (Flaxseed, Linseed, (GROUND FLAX SEEDS PO)), montelukast, and loratadine. We administered methylPREDNISolone acetate.  Meds ordered this encounter  Medications  . clarithromycin (BIAXIN XL) 500 MG 24 hr tablet    Sig: Take 2 tablets (1,000 mg total) by mouth daily.    Dispense:  28 tablet    Refill:  0  . methylPREDNISolone (MEDROL) 4 MG tablet    Sig: 5 tab po qd X 1d then 4 tab po qd X 1d then 3 tab po qd X 1d then 2 tab po qd then 1 tab po qd    Dispense:  15 tablet    Refill:  0  . methylPREDNISolone acetate (DEPO-MEDROL) injection 40 mg    Problem List Items Addressed This Visit    None    Visit Diagnoses    Bronchitis    -  Primary   Relevant Medications   clarithromycin  (BIAXIN XL) 500 MG 24 hr tablet   methylPREDNISolone (MEDROL) 4 MG tablet   methylPREDNISolone acetate (DEPO-MEDROL) injection 40 mg (Completed)      Follow-up: Return if symptoms worsen or fail to improve.  [name], DO

## 2017-06-06 NOTE — Telephone Encounter (Signed)
Noted  

## 2017-06-06 NOTE — Patient Instructions (Signed)

## 2017-06-17 ENCOUNTER — Telehealth: Payer: Self-pay | Admitting: *Deleted

## 2017-06-17 ENCOUNTER — Ambulatory Visit (INDEPENDENT_AMBULATORY_CARE_PROVIDER_SITE_OTHER): Payer: Medicare Other | Admitting: Family

## 2017-06-17 ENCOUNTER — Other Ambulatory Visit: Payer: Self-pay | Admitting: Family

## 2017-06-17 VITALS — BP 178/75 | HR 63 | Temp 99.0°F | Resp 16 | Ht 62.0 in | Wt 154.6 lb

## 2017-06-17 DIAGNOSIS — I10 Essential (primary) hypertension: Secondary | ICD-10-CM | POA: Diagnosis not present

## 2017-06-17 DIAGNOSIS — J45901 Unspecified asthma with (acute) exacerbation: Secondary | ICD-10-CM | POA: Diagnosis not present

## 2017-06-17 DIAGNOSIS — B370 Candidal stomatitis: Secondary | ICD-10-CM

## 2017-06-17 MED ORDER — NYSTATIN 100000 UNIT/ML MT SUSP: OROMUCOSAL | 0 refills | Status: AC

## 2017-06-17 MED ORDER — AMLODIPINE BESYLATE 2.5 MG PO TABS: 2.5000 mg | ORAL_TABLET | Freq: Every day | ORAL | 1 refills | Status: DC

## 2017-06-17 MED ORDER — METOPROLOL TARTRATE 25 MG PO TABS: ORAL_TABLET | ORAL | 1 refills | Status: DC

## 2017-06-17 MED ORDER — BUDESONIDE-FORMOTEROL FUMARATE 80-4.5 MCG/ACT IN AERO: 2.0000 | INHALATION_SPRAY | Freq: Two times a day (BID) | RESPIRATORY_TRACT | 3 refills | Status: DC

## 2017-06-17 NOTE — Patient Instructions (Signed)
Begin nystatin for thrush. Add amlodipine once daily for blood pressure. Begin symbicort twice daily. Continue albuterol every 6 hours for the next few days. Call if new/worsening symptoms or if symptoms are not improved in 1 week.

## 2017-06-17 NOTE — Progress Notes (Signed)
Subjective:    Patient ID: Kayla [name], female    DOB: 13-Feb-1946, 71 y.o.   MRN: [id number]  HPI  Kayla [name] is a 71 yr old female with history of asthma who presents today with c/o SOB.   She was treated with with medrol dose pak and biaxin for bronchitis on 9/27.  Reports that her symptoms improved with these measures.  Yesterday she had an exposure to smoke in her house. (microwaved a dish rag to sterilize it and it smoked).  She reports that she developed tightness I her breathing.  Reports that she had mild "lightheadedness" at dinner which she attributed to anxiety.  Used proair twice yesterday which helped.  She used her pulse ox yesterday and it was 96-99 % yesterday.    BP Readings from Last 3 Encounters:  06/17/17 (!) 163/78  06/06/17 130/82  05/21/17 (!) 142/64    Review of Systems    see HPI  Past Medical History:  Diagnosis Date  . Asthma   . CAP (community acquired pneumonia) 08/2012   Dr Birdie Riddle  . Diverticulitis   . Diverticulosis   . Fasting hyperglycemia 04/2011   FBS 108  . Fibromyalgia   . Fundic gland polyps of stomach, benign   . GERD (gastroesophageal reflux disease)    gastric polyp x3  . Glaucoma     Dr Bing Plume  . IBS (irritable bowel syndrome)   . Osteopenia    last 07/2011  . Pneumonia     OP as child  . Shingles      Social History   Social History  . Marital status: Married    Spouse name: N/A  . Number of children: N/A  . Years of education: N/A   Occupational History  . Not on file.   Social History Main Topics  . Smoking status: Former Smoker    Packs/day: 0.50    Years: 10.00    Types: Cigarettes    Quit date: 09/10/1974  . Smokeless tobacco: Never Used     Comment: smoked 1966- 1976, up to 1 ppd  . Alcohol use No  . Drug use: No  . Sexual activity: Yes   Other Topics Concern  . Not on file   Social History Narrative   Daily caffeine 3 cups   Regular exercise   Married          Past Surgical History:    Procedure Laterality Date  . COLONOSCOPY W/ POLYPECTOMY  [phone]   last  colonoscopy 2007, Dr Carlean Purl  . ESOPHAGOGASTRODUODENOSCOPY    . SPINE SURGERY     T9-L5 fusions    Family History  Problem Relation Age of Onset  . Breast cancer Mother   . Breast cancer Sister   . Breast cancer Maternal Aunt        two  . Breast cancer Paternal Aunt   . Asthma Paternal Aunt   . Heart failure Father        CHF  . Kidney disease Maternal Grandmother   . Ovarian cancer Unknown        Niece x2  . Breast cancer Unknown        niece  . Coronary artery disease Paternal Aunt        triple CABG  . Heart attack Paternal Grandmother        MI in late 53s  . Heart disease Paternal Grandfather   . Heart attack Paternal Uncle  MI in 30s  . Heart disease Maternal Aunt   . Pulmonary embolism Son   . Deep vein thrombosis Son   . Diabetes Neg Hx   . Stroke Neg Hx   . COPD Neg Hx   . Colon polyps Neg Hx     Allergies  Allergen Reactions  . Cephalosporins     Blisters  orally  . Gabapentin     REACTION: rash  . Levofloxacin     REACTION: stomach ache and rash  . Psyllium     REACTION: rash  . Sulfonamide Derivatives     REACTION: shock, urticaria  . Aspirin     REACTION: stomach pain  . Belladonna   . Ciprofloxacin Hcl   . Conjugated Estrogens   . Doxycycline     Abdominal pain  . Nabumetone   . Nsaids   . Soybean-Containing Drug Products Other (See Comments)    GI upset  . Zicam Cold Remedy [Erysidoron #1]   . Zocor [Simvastatin] Itching  . Prednisone Rash    NO PROBLEM WITH MEDROL DOSE PAK Oral prednisone caused facial burning, "made my face feel like it was on fire"    Current Outpatient Prescriptions on File Prior to Visit  Medication Sig Dispense Refill  . acetaminophen (TYLENOL) 500 MG tablet Take 500 mg by mouth as needed.    Marland Kitchen albuterol (PROVENTIL HFA;VENTOLIN HFA) 108 (90 Base) MCG/ACT inhaler Inhale 2 puffs into the lungs every 6 (six) hours as  needed for wheezing or shortness of breath. As needed only 1 Inhaler 3  . bifidobacterium infantis (ALIGN) capsule Take 1 capsule by mouth daily. 21 capsule 0  . BLACK ELDERBERRY,BERRY-FLOWER, PO Take by mouth. Reported on 03/15/2016    . carboxymethylcellulose (REFRESH PLUS) 0.5 % SOLN 1 drop 3 (three) times daily as needed.    . cholecalciferol (VITAMIN D) 1000 units tablet Take 1 capsule by mouth daily.    . colchicine 0.6 MG tablet Take 1 tablet (0.6 mg total) by mouth 2 (two) times daily. 1 po bid x 2-3 days then take 1 a day 30 tablet 0  . EPINEPHrine (EPI-PEN) 0.3 mg/0.3 mL DEVI Inject 0.3 mLs (0.3 mg total) into the muscle once. 1 Device 0  . esomeprazole (NEXIUM) 40 MG capsule Take 40 mg by mouth as needed. Reported on 03/15/2016    . Flaxseed, Linseed, (GROUND FLAX SEEDS PO) Take 1 to 1 1/2  Tablespoonful daily    . fluticasone (FLONASE) 50 MCG/ACT nasal spray INSTILL 2 SPRAYS IN EACH NOSTRIL ONCE DAILY 48 g 3  . GuaiFENesin (MUCINEX PO) Take by mouth as needed. Reported on 03/15/2016    . hydrOXYzine (ATARAX/VISTARIL) 10 MG tablet Take 1 tablet (10 mg total) by mouth 3 (three) times daily as needed. 30 tablet 0  . hyoscyamine (LEVSIN SL) 0.125 MG SL tablet DISSOLVE 1 TABLET UNDER THE TONGUE EVERY 6 HOURS AS NEEDED FOR ABDOMINAL PAIN 385 tablet 0  . loratadine (CLARITIN) 10 MG tablet Take 10 mg by mouth daily.    . metoprolol tartrate (LOPRESSOR) 25 MG tablet TAKE 1 TABLET BY MOUTH TWO  TIMES DAILY AND AN  ADDITIONAL TABLET ONCE  DAILY AS NEEDED 270 tablet 1  . montelukast (SINGULAIR) 10 MG tablet Take 1 tablet (10 mg total) by mouth daily. 30 tablet 6  . Multiple Minerals-Vitamins (CALCIUM CITRATE PLUS/MAGNESIUM PO) Take by mouth.    . nystatin cream (MYCOSTATIN) Apply 1 application topically 2 (two) times daily. 90 g 3  . Omega-3  Fatty Acids (FISH OIL PO) Take by mouth.    Marland Kitchen omeprazole (PRILOSEC) 40 MG capsule TAKE 1 CAPSULE BY MOUTH  DAILY 90 capsule 0  . ranitidine (ZANTAC) 150 MG tablet  Take 150 mg by mouth at bedtime.     . sodium chloride (OCEAN) 0.65 % SOLN nasal spray Place 1 spray into both nostrils as needed for congestion.    . travoprost, benzalkonium, (TRAVATAN) 0.004 % ophthalmic solution Place 1 drop into both eyes at bedtime.    . triamcinolone cream (KENALOG) 0.1 % Apply 1 application topically 2 (two) times daily. 90 g 3  . Vitamin Mixture (ESTER-C PO) Take 1,000 mg by mouth daily.     No current facility-administered medications on file prior to visit.     BP (!) 163/78 (BP Location: Right Arm, Cuff Size: Normal)   Pulse 63   Temp 99 F (37.2 C) (Oral)   Resp 16   Ht 5\' 2"  (1.575 m)   Wt 154 lb 9.6 oz (70.1 kg)   SpO2 98%   BMI 28.28 kg/m    Objective:   Physical Exam  Constitutional: She appears well-developed and well-nourished.  HENT:  Head: Normocephalic and atraumatic.  Right Ear: Tympanic membrane and ear canal normal.  Left Ear: Tympanic membrane and ear canal normal.  Oral thrush noted  Cardiovascular: Normal rate, regular rhythm and normal heart sounds.   No murmur heard. Pulmonary/Chest: Effort normal and breath sounds normal. No respiratory distress. She has no wheezes.  Musculoskeletal: She exhibits no edema.  Neurological: She is alert.  Psychiatric: She has a normal mood and affect. Her behavior is normal. Judgment and thought content normal.          Assessment & Plan:  Oral thrush- rx oral nystatin.  Asthma- mild exacerbation from smoke exposure. Will add symbicort- advised scheduled albuterol for the next few days.  HTN- uncontrolled. Add low dose amlodipine, continue current dose of metoprolol.

## 2017-06-17 NOTE — Telephone Encounter (Signed)
Pt in office today requesting refill of metoprolol to [facility] in [place]. States she no longer uses mail order. Refill sent.

## 2017-07-02 ENCOUNTER — Ambulatory Visit (INDEPENDENT_AMBULATORY_CARE_PROVIDER_SITE_OTHER): Payer: Medicare Other | Admitting: Family Medicine

## 2017-07-02 ENCOUNTER — Encounter: Payer: Self-pay | Admitting: Family Medicine

## 2017-07-02 VITALS — BP 128/82 | HR 68 | Temp 97.8°F | Ht 62.6 in | Wt 157.0 lb

## 2017-07-02 DIAGNOSIS — E559 Vitamin D deficiency, unspecified: Secondary | ICD-10-CM

## 2017-07-02 DIAGNOSIS — Z23 Encounter for immunization: Secondary | ICD-10-CM

## 2017-07-02 DIAGNOSIS — I10 Essential (primary) hypertension: Secondary | ICD-10-CM

## 2017-07-02 DIAGNOSIS — J45909 Unspecified asthma, uncomplicated: Secondary | ICD-10-CM | POA: Diagnosis not present

## 2017-07-02 MED ORDER — AMLODIPINE BESYLATE 2.5 MG PO TABS: 2.5000 mg | ORAL_TABLET | Freq: Every day | ORAL | 1 refills | Status: DC

## 2017-07-02 NOTE — Assessment & Plan Note (Signed)
Well controlled, no changes to meds. Encouraged heart healthy diet such as the DASH diet and exercise as tolerated.  °

## 2017-07-02 NOTE — Patient Instructions (Signed)
DASH Eating Plan DASH stands for "Dietary Approaches to Stop Hypertension." The DASH eating plan is a healthy eating plan that has been shown to reduce high blood pressure (hypertension). It may also reduce your risk for type 2 diabetes, heart disease, and stroke. The DASH eating plan may also help with weight loss. What are tips for following this plan? General guidelines  Avoid eating more than 2,300 mg (milligrams) of salt (sodium) a day. If you have hypertension, you may need to reduce your sodium intake to 1,500 mg a day.  Limit alcohol intake to no more than 1 drink a day for nonpregnant women and 2 drinks a day for men. One drink equals 12 oz of beer, 5 oz of wine, or 1 oz of hard liquor.  Work with your health care provider to maintain a healthy body weight or to lose weight. Ask what an ideal weight is for you.  Get at least 30 minutes of exercise that causes your heart to beat faster (aerobic exercise) most days of the week. Activities may include walking, swimming, or biking.  Work with your health care provider or diet and nutrition specialist (dietitian) to adjust your eating plan to your individual calorie needs. Reading food labels  Check food labels for the amount of sodium per serving. Choose foods with less than 5 percent of the Daily Value of sodium. Generally, foods with less than 300 mg of sodium per serving fit into this eating plan.  To find whole grains, look for the word "whole" as the first word in the ingredient list. Shopping  Buy products labeled as "low-sodium" or "no salt added."  Buy fresh foods. Avoid canned foods and premade or frozen meals. Cooking  Avoid adding salt when cooking. Use salt-free seasonings or herbs instead of table salt or sea salt. Check with your health care provider or pharmacist before using salt substitutes.  Do not fry foods. Cook foods using healthy methods such as baking, boiling, grilling, and broiling instead.  Cook with  heart-healthy oils, such as olive, canola, soybean, or sunflower oil. Meal planning   Eat a balanced diet that includes: ? 5 or more servings of fruits and vegetables each day. At each meal, try to fill half of your plate with fruits and vegetables. ? Up to 6-8 servings of whole grains each day. ? Less than 6 oz of lean meat, poultry, or fish each day. A 3-oz serving of meat is about the same size as a deck of cards. One egg equals 1 oz. ? 2 servings of low-fat dairy each day. ? A serving of nuts, seeds, or beans 5 times each week. ? Heart-healthy fats. Healthy fats called Omega-3 fatty acids are found in foods such as flaxseeds and coldwater fish, like sardines, salmon, and mackerel.  Limit how much you eat of the following: ? Canned or prepackaged foods. ? Food that is high in trans fat, such as fried foods. ? Food that is high in saturated fat, such as fatty meat. ? Sweets, desserts, sugary drinks, and other foods with added sugar. ? Full-fat dairy products.  Do not salt foods before eating.  Try to eat at least 2 vegetarian meals each week.  Eat more home-cooked food and less restaurant, buffet, and fast food.  When eating at a restaurant, ask that your food be prepared with less salt or no salt, if possible. What foods are recommended? The items listed may not be a complete list. Talk with your dietitian about what   dietary choices are best for you. Grains Whole-grain or whole-wheat bread. Whole-grain or whole-wheat pasta. Brown rice. Oatmeal. Quinoa. Bulgur. Whole-grain and low-sodium cereals. Pita bread. Low-fat, low-sodium crackers. Whole-wheat flour tortillas. Vegetables Fresh or frozen vegetables (raw, steamed, roasted, or grilled). Low-sodium or reduced-sodium tomato and vegetable juice. Low-sodium or reduced-sodium tomato sauce and tomato paste. Low-sodium or reduced-sodium canned vegetables. Fruits All fresh, dried, or frozen fruit. Canned fruit in natural juice (without  added sugar). Meat and other protein foods Skinless chicken or turkey. Ground chicken or turkey. Pork with fat trimmed off. Fish and seafood. Egg whites. Dried beans, peas, or lentils. Unsalted nuts, nut butters, and seeds. Unsalted canned beans. Lean cuts of beef with fat trimmed off. Low-sodium, lean deli meat. Dairy Low-fat (1%) or fat-free (skim) milk. Fat-free, low-fat, or reduced-fat cheeses. Nonfat, low-sodium ricotta or cottage cheese. Low-fat or nonfat yogurt. Low-fat, low-sodium cheese. Fats and oils Soft margarine without trans fats. Vegetable oil. Low-fat, reduced-fat, or light mayonnaise and salad dressings (reduced-sodium). Canola, safflower, olive, soybean, and sunflower oils. Avocado. Seasoning and other foods Herbs. Spices. Seasoning mixes without salt. Unsalted popcorn and pretzels. Fat-free sweets. What foods are not recommended? The items listed may not be a complete list. Talk with your dietitian about what dietary choices are best for you. Grains Baked goods made with fat, such as croissants, muffins, or some breads. Dry pasta or rice meal packs. Vegetables Creamed or fried vegetables. Vegetables in a cheese sauce. Regular canned vegetables (not low-sodium or reduced-sodium). Regular canned tomato sauce and paste (not low-sodium or reduced-sodium). Regular tomato and vegetable juice (not low-sodium or reduced-sodium). Pickles. Olives. Fruits Canned fruit in a light or heavy syrup. Fried fruit. Fruit in cream or butter sauce. Meat and other protein foods Fatty cuts of meat. Ribs. Fried meat. Bacon. Sausage. Bologna and other processed lunch meats. Salami. Fatback. Hotdogs. Bratwurst. Salted nuts and seeds. Canned beans with added salt. Canned or smoked fish. Whole eggs or egg yolks. Chicken or turkey with skin. Dairy Whole or 2% milk, cream, and half-and-half. Whole or full-fat cream cheese. Whole-fat or sweetened yogurt. Full-fat cheese. Nondairy creamers. Whipped toppings.  Processed cheese and cheese spreads. Fats and oils Butter. Stick margarine. Lard. Shortening. Ghee. Bacon fat. Tropical oils, such as coconut, palm kernel, or palm oil. Seasoning and other foods Salted popcorn and pretzels. Onion salt, garlic salt, seasoned salt, table salt, and sea salt. Worcestershire sauce. Tartar sauce. Barbecue sauce. Teriyaki sauce. Soy sauce, including reduced-sodium. Steak sauce. Canned and packaged gravies. Fish sauce. Oyster sauce. Cocktail sauce. Horseradish that you find on the shelf. Ketchup. Mustard. Meat flavorings and tenderizers. Bouillon cubes. Hot sauce and Tabasco sauce. Premade or packaged marinades. Premade or packaged taco seasonings. Relishes. Regular salad dressings. Where to find more information:  National Heart, Lung, and Blood Institute: www.nhlbi.nih.gov  American Heart Association: www.heart.org Summary  The DASH eating plan is a healthy eating plan that has been shown to reduce high blood pressure (hypertension). It may also reduce your risk for type 2 diabetes, heart disease, and stroke.  With the DASH eating plan, you should limit salt (sodium) intake to 2,300 mg a day. If you have hypertension, you may need to reduce your sodium intake to 1,500 mg a day.  When on the DASH eating plan, aim to eat more fresh fruits and vegetables, whole grains, lean proteins, low-fat dairy, and heart-healthy fats.  Work with your health care provider or diet and nutrition specialist (dietitian) to adjust your eating plan to your individual   calorie needs. This information is not intended to replace advice given to you by your health care provider. Make sure you discuss any questions you have with your health care provider. Document Released: 08/16/2011 Document Revised: 08/20/2016 Document Reviewed: 08/20/2016 Elsevier Interactive Patient Education  2017 [employer]  

## 2017-07-02 NOTE — Progress Notes (Signed)
Patient ID: Kayla [name], female    DOB: March 04, 1946  Age: 71 y.o. MRN: [id number]    Subjective:  Subjective  HPI Kayla [name] presents for f/u bp.  No complaints.    Review of Systems  Constitutional: Negative for activity change, appetite change, diaphoresis, fatigue and unexpected weight change.  Eyes: Negative for pain, redness and visual disturbance.  Respiratory: Negative for cough, chest tightness, shortness of breath and wheezing.   Cardiovascular: Negative for chest pain, palpitations and leg swelling.  Endocrine: Negative for cold intolerance, heat intolerance, polydipsia, polyphagia and polyuria.  Genitourinary: Negative for difficulty urinating, dysuria and frequency.  Neurological: Negative for dizziness, light-headedness, numbness and headaches.  Psychiatric/Behavioral: Negative for behavioral problems and dysphoric mood. The patient is not nervous/anxious.     History Past Medical History:  Diagnosis Date  . Asthma   . CAP (community acquired pneumonia) 08/2012   Dr Birdie Riddle  . Diverticulitis   . Diverticulosis   . Fasting hyperglycemia 04/2011   FBS 108  . Fibromyalgia   . Fundic gland polyps of stomach, benign   . GERD (gastroesophageal reflux disease)    gastric polyp x3  . Glaucoma     Dr Bing Plume  . IBS (irritable bowel syndrome)   . Osteopenia    last 07/2011  . Pneumonia     OP as child  . Shingles     She has a past surgical history that includes Spine surgery; Colonoscopy w/ polypectomy ([id number]); and Esophagogastroduodenoscopy.   Her family history includes Asthma in her paternal aunt; Breast cancer in her maternal aunt, mother, paternal aunt, sister, and unknown relative; Coronary artery disease in her paternal aunt; Deep vein thrombosis in her son; Heart attack in her paternal grandmother and paternal uncle; Heart disease in her maternal aunt and paternal grandfather; Heart failure in her father; Kidney disease in her maternal  grandmother; Ovarian cancer in her unknown relative; Pulmonary embolism in her son.She reports that she quit smoking about 42 years ago. Her smoking use included Cigarettes. She has a 5.00 pack-year smoking history. She has never used smokeless tobacco. She reports that she does not drink alcohol or use drugs.  Current Outpatient Prescriptions on File Prior to Visit  Medication Sig Dispense Refill  . acetaminophen (TYLENOL) 500 MG tablet Take 500 mg by mouth as needed.    Marland Kitchen albuterol (PROVENTIL HFA;VENTOLIN HFA) 108 (90 Base) MCG/ACT inhaler Inhale 2 puffs into the lungs every 6 (six) hours as needed for wheezing or shortness of breath. As needed only 1 Inhaler 3  . bifidobacterium infantis (ALIGN) capsule Take 1 capsule by mouth daily. 21 capsule 0  . BLACK ELDERBERRY,BERRY-FLOWER, PO Take by mouth. Reported on 03/15/2016    . budesonide-formoterol (SYMBICORT) 80-4.5 MCG/ACT inhaler Inhale 2 puffs into the lungs 2 (two) times daily. 1 Inhaler 3  . carboxymethylcellulose (REFRESH PLUS) 0.5 % SOLN 1 drop 3 (three) times daily as needed.    . cholecalciferol (VITAMIN D) 1000 units tablet Take 1 capsule by mouth daily.    . colchicine 0.6 MG tablet Take 1 tablet (0.6 mg total) by mouth 2 (two) times daily. 1 po bid x 2-3 days then take 1 a day 30 tablet 0  . EPINEPHrine (EPI-PEN) 0.3 mg/0.3 mL DEVI Inject 0.3 mLs (0.3 mg total) into the muscle once. 1 Device 0  . esomeprazole (NEXIUM) 40 MG capsule Take 40 mg by mouth as needed. Reported on 03/15/2016    . Flaxseed, Linseed, (GROUND FLAX SEEDS PO) Take  1 to 1 1/2  Tablespoonful daily    . fluticasone (FLONASE) 50 MCG/ACT nasal spray INSTILL 2 SPRAYS IN EACH NOSTRIL ONCE DAILY 48 g 3  . GuaiFENesin (MUCINEX PO) Take by mouth as needed. Reported on 03/15/2016    . hydrOXYzine (ATARAX/VISTARIL) 10 MG tablet Take 1 tablet (10 mg total) by mouth 3 (three) times daily as needed. 30 tablet 0  . loratadine (CLARITIN) 10 MG tablet Take 10 mg by mouth daily.    .  metoprolol tartrate (LOPRESSOR) 25 MG tablet TAKE 1 TABLET BY MOUTH TWO  TIMES DAILY AND AN  ADDITIONAL TABLET ONCE  DAILY AS NEEDED 270 tablet 1  . montelukast (SINGULAIR) 10 MG tablet Take 1 tablet (10 mg total) by mouth daily. 30 tablet 6  . Multiple Minerals-Vitamins (CALCIUM CITRATE PLUS/MAGNESIUM PO) Take by mouth.    . nystatin cream (MYCOSTATIN) Apply 1 application topically 2 (two) times daily. 90 g 3  . Omega-3 Fatty Acids (FISH OIL PO) Take by mouth.    Marland Kitchen omeprazole (PRILOSEC) 40 MG capsule TAKE 1 CAPSULE BY MOUTH  DAILY 90 capsule 0  . ranitidine (ZANTAC) 150 MG tablet Take 150 mg by mouth at bedtime.     . sodium chloride (OCEAN) 0.65 % SOLN nasal spray Place 1 spray into both nostrils as needed for congestion.    . travoprost, benzalkonium, (TRAVATAN) 0.004 % ophthalmic solution Place 1 drop into both eyes at bedtime.    . triamcinolone cream (KENALOG) 0.1 % Apply 1 application topically 2 (two) times daily. 90 g 3  . Vitamin Mixture (ESTER-C PO) Take 1,000 mg by mouth daily.     No current facility-administered medications on file prior to visit.      Objective:  Objective  Physical Exam  Constitutional: She is oriented to person, place, and time. She appears well-developed and well-nourished.  HENT:  Head: Normocephalic and atraumatic.  Eyes: Conjunctivae and EOM are normal.  Neck: Normal range of motion. Neck supple. No JVD present. Carotid bruit is not present. No thyromegaly present.  Cardiovascular: Normal rate, regular rhythm and normal heart sounds.   No murmur heard. Pulmonary/Chest: Effort normal and breath sounds normal. No respiratory distress. She has no wheezes. She has no rales. She exhibits no tenderness.  Musculoskeletal: She exhibits no edema.  Neurological: She is alert and oriented to person, place, and time.  Psychiatric: She has a normal mood and affect.  Nursing note and vitals reviewed.  BP 128/82   Pulse 68   Temp 97.8 F (36.6 C) (Oral)   Ht  5' 2.6" (1.59 m)   Wt 157 lb (71.2 kg)   SpO2 98%   BMI 28.17 kg/m  Wt Readings from Last 3 Encounters:  07/02/17 157 lb (71.2 kg)  06/17/17 154 lb 9.6 oz (70.1 kg)  06/06/17 159 lb (72.1 kg)     Lab Results  Component Value Date   WBC 4.3 05/24/2016   HGB 13.2 05/24/2016   HCT 38.7 05/24/2016   PLT 167.0 05/24/2016   GLUCOSE 89 01/24/2017   CHOL 220 (H) 01/24/2017   TRIG 82.0 01/24/2017   HDL 71.90 01/24/2017   LDLDIRECT 153.3 12/18/2012   LDLCALC 132 (H) 01/24/2017   ALT 19 01/24/2017   AST 22 01/24/2017   NA 138 01/24/2017   K 5.3 (H) 01/24/2017   CL 103 01/24/2017   CREATININE 0.79 01/24/2017   BUN 16 01/24/2017   CO2 29 01/24/2017   TSH 1.22 12/18/2012   INR 0.97 12/15/2009  HGBA1C 5.4 11/01/2011    Dg Chest 2 View  Result Date: 05/21/2017 CLINICAL DATA:  Cough and chest congestion for the past 10 days associated with low-grade fever. History of asthma, former smoker. EXAM: CHEST  2 VIEW COMPARISON:  Chest x-ray of May 24, 2016 FINDINGS: The lungs are adequately inflated. There is no focal infiltrate. The interstitial markings are mildly prominent though stable. The heart and pulmonary vascularity are normal. There is curvature of the thoracolumbar spine convex toward the left. Harrington rods are present. IMPRESSION: There is no acute cardiopulmonary abnormality. Mild interstitial prominence is consistent with known reactive airway disease. Electronically Signed   By: David  Martinique M.D.   On: 05/21/2017 15:48     Assessment & Plan:  Plan  I have discontinued Ms. Illingworth's hyoscyamine and clarithromycin. I am also having her maintain her GuaiFENesin (MUCINEX PO), EPINEPHrine, ranitidine, acetaminophen, esomeprazole, bifidobacterium infantis, travoprost (benzalkonium), Omega-3 Fatty Acids (FISH OIL PO), Multiple Minerals-Vitamins (CALCIUM CITRATE PLUS/MAGNESIUM PO), nystatin cream, (BLACK ELDERBERRY,BERRY-FLOWER, PO), sodium chloride, carboxymethylcellulose,  triamcinolone cream, colchicine, fluticasone, omeprazole, Vitamin Mixture (ESTER-C PO), hydrOXYzine, albuterol, cholecalciferol, (Flaxseed, Linseed, (GROUND FLAX SEEDS PO)), montelukast, loratadine, metoprolol tartrate, budesonide-formoterol, and amLODipine.  Meds ordered this encounter  Medications  . DISCONTD: amLODipine (NORVASC) 2.5 MG tablet    Sig: Take 1 tablet (2.5 mg total) by mouth daily.    Dispense:  30 tablet    Refill:  1  . amLODipine (NORVASC) 2.5 MG tablet    Sig: Take 1 tablet (2.5 mg total) by mouth daily.    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 Encouraged heart healthy diet such as the DASH diet and exercise as tolerated.        Relevant Medications   amLODipine (NORVASC) 2.5 MG tablet   Other Relevant Orders   Lipid panel   Comprehensive metabolic panel    Other Visit Diagnoses    Need for immunization against influenza    -  Primary   Relevant Orders   Flu vaccine HIGH DOSE PF (Fluzone High dose)   Vitamin D deficiency       Relevant Orders   Vitamin D (25 hydroxy)      Follow-up: Return in about 6 months (around 12/31/2017), or if symptoms worsen or fail to improve, for hypertension, annual exam, fasting.  [name], DO

## 2017-07-02 NOTE — Assessment & Plan Note (Addendum)
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

## 2017-07-10 NOTE — Progress Notes (Addendum)
Subjective:   Kayla [name] is a 71 y.o. female who presents for Medicare Annual (Subsequent) preventive examination.  She describes her health as very good!!!  Review of Systems:  No ROS.  Medicare Wellness Visit. Additional risk factors are reflected in the social history.  Cardiac Risk Factors include: advanced age (>79men, >70 women);dyslipidemia;hypertension Sleep patterns: Really good per pt.   Female:    Mammo- 10/03/16-normal      Dexa scan- 10/03/16 osteoporosis    CCS- 03/15/16- no recall due to age    Objective:     Vitals: There were no vitals taken for this visit.  There is no height or weight on file to calculate BMI.   Tobacco Social History   Tobacco Use  Smoking Status Former Smoker  . Packs/day: 0.50  . Years: 10.00  . Pack years: 5.00  . Types: Cigarettes  . Last attempt to quit: 09/10/1974  . Years since quitting: 42.8  Smokeless Tobacco Never Used  Tobacco Comment   smoked 1966- 1976, up to 1 ppd     Counseling given: Not Answered Comment: smoked 1966- 1976, up to 1 ppd   Past Medical History:  Diagnosis Date  . Asthma   . CAP (community acquired pneumonia) 08/2012   Dr Birdie Riddle  . Diverticulitis   . Diverticulosis   . Fasting hyperglycemia 04/2011   FBS 108  . Fibromyalgia   . Fundic gland polyps of stomach, benign   . GERD (gastroesophageal reflux disease)    gastric polyp x3  . Glaucoma     Dr Bing Plume  . IBS (irritable bowel syndrome)   . Osteopenia    last 07/2011  . Pneumonia     OP as child  . Shingles    Past Surgical History:  Procedure Laterality Date  . COLONOSCOPY W/ POLYPECTOMY  [phone]   last  colonoscopy 2007, Dr Carlean Purl  . ESOPHAGOGASTRODUODENOSCOPY    . SPINE SURGERY     T9-L5 fusions   Family History  Problem Relation Age of Onset  . Breast cancer Mother   . Breast cancer Sister   . Breast cancer Maternal Aunt        two  . Breast cancer Paternal Aunt   . Asthma Paternal Aunt   . Heart failure  Father        CHF  . Kidney disease Maternal Grandmother   . Ovarian cancer Unknown        Niece x2  . Breast cancer Unknown        niece  . Coronary artery disease Paternal Aunt        triple CABG  . Heart attack Paternal Grandmother        MI in late 34s  . Heart disease Paternal Grandfather   . Heart attack Paternal Uncle         MI in 50s  . Heart disease Maternal Aunt   . Pulmonary embolism Son   . Deep vein thrombosis Son   . Diabetes Neg Hx   . Stroke Neg Hx   . COPD Neg Hx   . Colon polyps Neg Hx    Social History   Substance and Sexual Activity  Sexual Activity Yes    Outpatient Encounter Medications as of 07/17/2017  Medication Sig  . acetaminophen (TYLENOL) 500 MG tablet Take 500 mg by mouth as needed.  Marland Kitchen albuterol (PROVENTIL HFA;VENTOLIN HFA) 108 (90 Base) MCG/ACT inhaler Inhale 2 puffs into the lungs every 6 (six) hours as  needed for wheezing or shortness of breath. As needed only  . amLODipine (NORVASC) 2.5 MG tablet Take 1 tablet (2.5 mg total) by mouth daily.  . bifidobacterium infantis (ALIGN) capsule Take 1 capsule by mouth daily.  Marland Kitchen BLACK ELDERBERRY,BERRY-FLOWER, PO Take by mouth. Reported on 03/15/2016  . budesonide-formoterol (SYMBICORT) 80-4.5 MCG/ACT inhaler Inhale 2 puffs into the lungs 2 (two) times daily.  . carboxymethylcellulose (REFRESH PLUS) 0.5 % SOLN 1 drop 3 (three) times daily as needed.  . cholecalciferol (VITAMIN D) 1000 units tablet Take 1 capsule by mouth daily.  Marland Kitchen esomeprazole (NEXIUM) 40 MG capsule Take 40 mg by mouth as needed. Reported on 03/15/2016  . Flaxseed, Linseed, (GROUND FLAX SEEDS PO) Take 1 to 1 1/2  Tablespoonful daily  . fluticasone (FLONASE) 50 MCG/ACT nasal spray INSTILL 2 SPRAYS IN EACH NOSTRIL ONCE DAILY  . GuaiFENesin (MUCINEX PO) Take by mouth as needed. Reported on 03/15/2016  . hydrOXYzine (ATARAX/VISTARIL) 10 MG tablet Take 1 tablet (10 mg total) by mouth 3 (three) times daily as needed.  . metoprolol tartrate  (LOPRESSOR) 25 MG tablet TAKE 1 TABLET BY MOUTH TWO  TIMES DAILY AND AN  ADDITIONAL TABLET ONCE  DAILY AS NEEDED  . montelukast (SINGULAIR) 10 MG tablet Take 1 tablet (10 mg total) by mouth daily.  . Multiple Minerals-Vitamins (CALCIUM CITRATE PLUS/MAGNESIUM PO) Take by mouth.  . nystatin cream (MYCOSTATIN) Apply 1 application topically 2 (two) times daily.  . Omega-3 Fatty Acids (FISH OIL PO) Take by mouth.  Marland Kitchen omeprazole (PRILOSEC) 40 MG capsule TAKE 1 CAPSULE BY MOUTH  DAILY  . ranitidine (ZANTAC) 150 MG tablet Take 150 mg by mouth at bedtime.   . sodium chloride (OCEAN) 0.65 % SOLN nasal spray Place 1 spray into both nostrils as needed for congestion.  . travoprost, benzalkonium, (TRAVATAN) 0.004 % ophthalmic solution Place 1 drop into both eyes at bedtime.  . triamcinolone cream (KENALOG) 0.1 % Apply 1 application topically 2 (two) times daily.  . Vitamin Mixture (ESTER-C PO) Take 1,000 mg by mouth daily.  . colchicine 0.6 MG tablet Take 1 tablet (0.6 mg total) by mouth 2 (two) times daily. 1 po bid x 2-3 days then take 1 a day (Patient not taking: Reported on 07/17/2017)  . EPINEPHrine (EPI-PEN) 0.3 mg/0.3 mL DEVI Inject 0.3 mLs (0.3 mg total) into the muscle once. (Patient not taking: Reported on 07/17/2017)  . loratadine (CLARITIN) 10 MG tablet Take 10 mg by mouth daily.   No facility-administered encounter medications on file as of 07/17/2017.     Activities of Daily Living In your present state of health, do you have any difficulty performing the following activities: 07/17/2017 07/02/2017  Hearing? N N  Vision? N N  Comment Wearing glasses. Eye doctor every 3-4 months for glaucoma. -  Difficulty concentrating or making decisions? N N  Walking or climbing stairs? N N  Dressing or bathing? N N  Doing errands, shopping? N N  Preparing Food and eating ? N -  Using the Toilet? N -  In the past six months, have you accidently leaked urine? N -  Do you have problems with loss of bowel  control? N -  Managing your Medications? N -  Managing your Finances? N -  Housekeeping or managing your Housekeeping? N -  Some recent data might be hidden    Patient Care Team: Carollee Herter, Alferd Apa, DO as PCP - General (Family Medicine) Hendricks Limes, MD as Referring Physician (Internal Medicine) Carlean Purl,  Ofilia Neas, MD as Consulting Physician (Gastroenterology) Calvert Cantor, MD as Consulting Physician (Ophthalmology) Beshears, Dorie Rank, DMD (Dentistry) Minus Breeding, MD as Consulting Physician (Cardiology) Izora Gala, MD as Consulting Physician (Otolaryngology)    Assessment:    Physical assessment deferred to PCP.  Exercise Activities and Dietary recommendations Current Exercise Habits: The patient does not participate in regular exercise at present Diet (meal preparation, eat out, water intake, caffeinated beverages, dairy products, fruits and vegetables): in general, a "healthy" diet       Goal: Maintain current healthy lifestyle and begin gym again.  Fall Risk Fall Risk  07/17/2017 07/02/2017 12/30/2015 08/22/2015 02/17/2015  Falls in the past year? No No No Yes No  Number falls in past yr: - - - 1 -  Injury with Fall? - - - No -  Follow up - - - Falls evaluation completed -   Depression Screen PHQ 2/9 Scores 07/17/2017 07/02/2017 08/23/2016 06/07/2016  PHQ - 2 Score 0 0 0 0  Exception Documentation - - - -     Cognitive Function MMSE - Mini Mental State Exam 07/17/2017  Orientation to time 5  Orientation to Place 5  Registration 3  Attention/ Calculation 5  Recall 3  Language- name 2 objects 2  Language- repeat 1  Language- follow 3 step command 3  Language- read & follow direction 1  Write a sentence 1  Copy design 1  Total score 30        Immunization History  Administered Date(s) Administered  . Influenza Whole 06/14/2008, 06/09/2010, 07/06/2011  . Influenza, High Dose Seasonal PF 06/10/2014, 05/26/2015, 05/24/2016, 07/02/2017  . Influenza,  Seasonal, Injecte, Preservative Fre 05/26/2013  . Influenza-Unspecified 05/30/2012, 05/22/2013  . Pneumococcal Conjugate-13 01/11/2014  . Pneumococcal Polysaccharide-23 07/12/2011  . Td 03/07/2006  . Tdap 02/17/2015  . Zoster 08/19/2013   Screening Tests Health Maintenance  Topic Date Due  . MAMMOGRAM  10/03/2018  . TETANUS/TDAP  02/16/2025  . COLONOSCOPY  03/15/2026  . INFLUENZA VACCINE  Completed  . DEXA SCAN  Completed  . Hepatitis C Screening  Completed  . PNA vac Low Risk Adult  Completed      Plan:   Follow up with PCP as directed.  Continue to eat heart healthy diet (full of fruits, vegetables, whole grains, lean protein, water--limit salt, fat, and sugar intake) and increase physical activity as tolerated.  Continue doing brain stimulating activities (puzzles, reading, adult coloring books, staying active) to keep memory sharp.   Bring a copy of your living will and/or healthcare power of attorney to your next office visit.     I have personally reviewed and noted the following in the patient's chart:   . Medical and social history . Use of alcohol, tobacco or illicit drugs  . Current medications and supplements . Functional ability and status . Nutritional status . Physical activity . Advanced directives . List of other physicians . Hospitalizations, surgeries, and ER visits in previous 12 months . Vitals . Screenings to include cognitive, depression, and falls . Referrals and appointments  In addition, I have reviewed and discussed with patient certain preventive protocols, quality metrics, and best practice recommendations. A written personalized care plan for preventive services as well as general preventive health recommendations were provided to patient.     Shela Nevin, RN  07/17/2017    I have reviewed the following note by Ms. Mansur Patti and agree with her documentation

## 2017-07-12 ENCOUNTER — Encounter: Payer: Self-pay | Admitting: Family Medicine

## 2017-07-12 DIAGNOSIS — H401132 Primary open-angle glaucoma, bilateral, moderate stage: Secondary | ICD-10-CM | POA: Diagnosis not present

## 2017-07-12 DIAGNOSIS — H2513 Age-related nuclear cataract, bilateral: Secondary | ICD-10-CM | POA: Diagnosis not present

## 2017-07-12 DIAGNOSIS — H04123 Dry eye syndrome of bilateral lacrimal glands: Secondary | ICD-10-CM | POA: Diagnosis not present

## 2017-07-17 ENCOUNTER — Ambulatory Visit (INDEPENDENT_AMBULATORY_CARE_PROVIDER_SITE_OTHER): Payer: Medicare Other | Admitting: *Deleted

## 2017-07-17 ENCOUNTER — Encounter: Payer: Self-pay | Admitting: *Deleted

## 2017-07-17 VITALS — BP 132/60 | HR 57 | Ht 63.0 in | Wt 157.4 lb

## 2017-07-17 DIAGNOSIS — Z0000 Encounter for general adult medical examination without abnormal findings: Secondary | ICD-10-CM | POA: Diagnosis not present

## 2017-07-17 NOTE — Patient Instructions (Signed)
Continue to eat heart healthy diet (full of fruits, vegetables, whole grains, lean protein, water--limit salt, fat, and sugar intake) and increase physical activity as tolerated.  Continue doing brain stimulating activities (puzzles, reading, adult coloring books, staying active) to keep memory sharp.   Bring a copy of your living will and/or healthcare power of attorney to your next office visit.   Kayla [name] , Thank you for taking time to come for your Medicare Wellness Visit. I appreciate your ongoing commitment to your health goals. Please review the following plan we discussed and let me know if I can assist you in the future.   These are the goals we discussed:Maintain current healthy lifestyle.   This is a list of the screening recommended for you and due dates:  Health Maintenance  Topic Date Due  . Mammogram  10/03/2018  . Tetanus Vaccine  02/16/2025  . Colon Cancer Screening  03/15/2026  . Flu Shot  Completed  . DEXA scan (bone density measurement)  Completed  .  Hepatitis C: One time screening is recommended by Center for Disease Control  (CDC) for  adults born from [birth year] through 1966.   Completed  . Pneumonia vaccines  Completed     Health Maintenance for Postmenopausal Women Menopause is a normal process in which your reproductive ability comes to an end. This process happens gradually over a span of months to years, usually between the ages of 54 and 96. Menopause is complete when you have missed 12 consecutive menstrual periods. It is important to talk with your health care provider about some of the most common conditions that affect postmenopausal women, such as heart disease, cancer, and bone loss (osteoporosis). Adopting a healthy lifestyle and getting preventive care can help to promote your health and wellness. Those actions can also lower your chances of developing some of these common conditions. What should I know about menopause? During menopause, you may  experience a number of symptoms, such as:  Moderate-to-severe hot flashes.  Night sweats.  Decrease in sex drive.  Mood swings.  Headaches.  Tiredness.  Irritability.  Memory problems.  Insomnia.  Choosing to treat or not to treat menopausal changes is an individual decision that you make with your health care provider. What should I know about hormone replacement therapy and supplements? Hormone therapy products are effective for treating symptoms that are associated with menopause, such as hot flashes and night sweats. Hormone replacement carries certain risks, especially as you become older. If you are thinking about using estrogen or estrogen with progestin treatments, discuss the benefits and risks with your health care provider. What should I know about heart disease and stroke? Heart disease, heart attack, and stroke become more likely as you age. This may be due, in part, to the hormonal changes that your body experiences during menopause. These can affect how your body processes dietary fats, triglycerides, and cholesterol. Heart attack and stroke are both medical emergencies. There are many things that you can do to help prevent heart disease and stroke:  Have your blood pressure checked at least every 1-2 years. High blood pressure causes heart disease and increases the risk of stroke.  If you are 48-24 years old, ask your health care provider if you should take aspirin to prevent a heart attack or a stroke.  Do not use any tobacco products, including cigarettes, chewing tobacco, or electronic cigarettes. If you need help quitting, ask your health care provider.  It is important to eat a healthy  diet and maintain a healthy weight. ? Be sure to include plenty of vegetables, fruits, low-fat dairy products, and lean protein. ? Avoid eating foods that are high in solid fats, added sugars, or salt (sodium).  Get regular exercise. This is one of the most important things  that you can do for your health. ? Try to exercise for at least 150 minutes each week. The type of exercise that you do should increase your heart rate and make you sweat. This is known as moderate-intensity exercise. ? Try to do strengthening exercises at least twice each week. Do these in addition to the moderate-intensity exercise.  Know your numbers.Ask your health care provider to check your cholesterol and your blood glucose. Continue to have your blood tested as directed by your health care provider.  What should I know about cancer screening? There are several types of cancer. Take the following steps to reduce your risk and to catch any cancer development as early as possible. Breast Cancer  Practice breast self-awareness. ? This means understanding how your breasts normally appear and feel. ? It also means doing regular breast self-exams. Let your health care provider know about any changes, no matter how small.  If you are 62 or older, have a clinician do a breast exam (clinical breast exam or CBE) every year. Depending on your age, family history, and medical history, it may be recommended that you also have a yearly breast X-ray (mammogram).  If you have a family history of breast cancer, talk with your health care provider about genetic screening.  If you are at high risk for breast cancer, talk with your health care provider about having an MRI and a mammogram every year.  Breast cancer (BRCA) gene test is recommended for women who have family members with BRCA-related cancers. Results of the assessment will determine the need for genetic counseling and BRCA1 and for BRCA2 testing. BRCA-related cancers include these types: ? Breast. This occurs in males or females. ? Ovarian. ? Tubal. This may also be called fallopian tube cancer. ? Cancer of the abdominal or pelvic lining (peritoneal cancer). ? Prostate. ? Pancreatic.  Cervical, Uterine, and Ovarian Cancer Your health  care provider may recommend that you be screened regularly for cancer of the pelvic organs. These include your ovaries, uterus, and vagina. This screening involves a pelvic exam, which includes checking for microscopic changes to the surface of your cervix (Pap test).  For women ages 21-65, health care providers may recommend a pelvic exam and a Pap test every three years. For women ages 89-65, they may recommend the Pap test and pelvic exam, combined with testing for human papilloma virus (HPV), every five years. Some types of HPV increase your risk of cervical cancer. Testing for HPV may also be done on women of any age who have unclear Pap test results.  Other health care providers may not recommend any screening for nonpregnant women who are considered low risk for pelvic cancer and have no symptoms. Ask your health care provider if a screening pelvic exam is right for you.  If you have had past treatment for cervical cancer or a condition that could lead to cancer, you need Pap tests and screening for cancer for at least 20 years after your treatment. If Pap tests have been discontinued for you, your risk factors (such as having a new sexual partner) need to be reassessed to determine if you should start having screenings again. Some women have medical problems that  increase the chance of getting cervical cancer. In these cases, your health care provider may recommend that you have screening and Pap tests more often.  If you have a family history of uterine cancer or ovarian cancer, talk with your health care provider about genetic screening.  If you have vaginal bleeding after reaching menopause, tell your health care provider.  There are currently no reliable tests available to screen for ovarian cancer.  Lung Cancer Lung cancer screening is recommended for adults 47-97 years old who are at high risk for lung cancer because of a history of smoking. A yearly low-dose CT scan of the lungs is  recommended if you:  Currently smoke.  Have a history of at least 30 pack-years of smoking and you currently smoke or have quit within the past 15 years. A pack-year is smoking an average of one pack of cigarettes per day for one year.  Yearly screening should:  Continue until it has been 15 years since you quit.  Stop if you develop a health problem that would prevent you from having lung cancer treatment.  Colorectal Cancer  This type of cancer can be detected and can often be prevented.  Routine colorectal cancer screening usually begins at age 14 and continues through age 23.  If you have risk factors for colon cancer, your health care provider may recommend that you be screened at an earlier age.  If you have a family history of colorectal cancer, talk with your health care provider about genetic screening.  Your health care provider may also recommend using home test kits to check for hidden blood in your stool.  A small camera at the end of a tube can be used to examine your colon directly (sigmoidoscopy or colonoscopy). This is done to check for the earliest forms of colorectal cancer.  Direct examination of the colon should be repeated every 5-10 years until age 55. However, if early forms of precancerous polyps or small growths are found or if you have a family history or genetic risk for colorectal cancer, you may need to be screened more often.  Skin Cancer  Check your skin from head to toe regularly.  Monitor any moles. Be sure to tell your health care provider: ? About any new moles or changes in moles, especially if there is a change in a mole's shape or color. ? If you have a mole that is larger than the size of a pencil eraser.  If any of your family members has a history of skin cancer, especially at a young age, talk with your health care provider about genetic screening.  Always use sunscreen. Apply sunscreen liberally and repeatedly throughout the  day.  Whenever you are outside, protect yourself by wearing long sleeves, pants, a wide-brimmed hat, and sunglasses.  What should I know about osteoporosis? Osteoporosis is a condition in which bone destruction happens more quickly than new bone creation. After menopause, you may be at an increased risk for osteoporosis. To help prevent osteoporosis or the bone fractures that can happen because of osteoporosis, the following is recommended:  If you are 66-15 years old, get at least 1,000 mg of calcium and at least 600 mg of vitamin D per day.  If you are older than age 43 but younger than age 7, get at least 1,200 mg of calcium and at least 600 mg of vitamin D per day.  If you are older than age 26, get at least 1,200 mg of calcium  and at least 800 mg of vitamin D per day.  Smoking and excessive alcohol intake increase the risk of osteoporosis. Eat foods that are rich in calcium and vitamin D, and do weight-bearing exercises several times each week as directed by your health care provider. What should I know about how menopause affects my mental health? Depression may occur at any age, but it is more common as you become older. Common symptoms of depression include:  Low or sad mood.  Changes in sleep patterns.  Changes in appetite or eating patterns.  Feeling an overall lack of motivation or enjoyment of activities that you previously enjoyed.  Frequent crying spells.  Talk with your health care provider if you think that you are experiencing depression. What should I know about immunizations? It is important that you get and maintain your immunizations. These include:  Tetanus, diphtheria, and pertussis (Tdap) booster vaccine.  Influenza every year before the flu season begins.  Pneumonia vaccine.  Shingles vaccine.  Your health care provider may also recommend other immunizations. This information is not intended to replace advice given to you by your health care provider.  Make sure you discuss any questions you have with your health care provider. Document Released: 10/19/2005 Document Revised: 03/16/2016 Document Reviewed: 05/31/2015 Elsevier Interactive Patient Education  2018 [employer].

## 2017-08-04 ENCOUNTER — Other Ambulatory Visit: Payer: Self-pay

## 2017-08-04 ENCOUNTER — Emergency Department (INDEPENDENT_AMBULATORY_CARE_PROVIDER_SITE_OTHER): Payer: Medicare Other

## 2017-08-04 ENCOUNTER — Emergency Department (INDEPENDENT_AMBULATORY_CARE_PROVIDER_SITE_OTHER)
Admission: EM | Admit: 2017-08-04 | Discharge: 2017-08-04 | Disposition: A | Discharge status: Home / Self Care | Payer: Medicare Other | Source: Home / Self Care | Attending: Family Medicine | Admitting: Family Medicine

## 2017-08-04 ENCOUNTER — Encounter: Payer: Self-pay | Admitting: Emergency Medicine

## 2017-08-04 DIAGNOSIS — R042 Hemoptysis: Secondary | ICD-10-CM

## 2017-08-04 DIAGNOSIS — J029 Acute pharyngitis, unspecified: Secondary | ICD-10-CM

## 2017-08-04 DIAGNOSIS — J069 Acute upper respiratory infection, unspecified: Secondary | ICD-10-CM

## 2017-08-04 NOTE — ED Triage Notes (Signed)
Cough with some blood tinged discharge x 1 day

## 2017-08-04 NOTE — Discharge Instructions (Signed)
Take plain guaifenesin (600 or1200mg  extended release tabs such as Mucinex) twice daily, with plenty of water, for cough and congestion.   Get adequate rest.   Also recommend using saline nasal spray several times daily and saline nasal irrigation (AYR is a common brand).  Use Flonase nasal spray each morning after using saline nasal irrigation. Try warm salt water gargles for sore throat.  Stop all antihistamines for now, and other non-prescription cough/cold preparations. Continue all inhalers as prescribed. May take Delsym Cough Suppressant at bedtime for nighttime cough.

## 2017-08-04 NOTE — ED Provider Notes (Signed)
[name]    CSN: [id number] Arrival date & time: 08/04/17  1320     History   Chief Complaint Chief Complaint  Patient presents with  . Cough    HPI Kayla [name] is a 71 y.o. female.   Patient reports that she has developed a "scratchy" throat during the past two days, and has had increased post-nasal drainage and increased sinus congestion in the morning.  Yesterday she coughed up a very small amount of blood-tinged sputum.  He face has felt flushed at times, and she has noticed vague discomfort in her lateral neck.  She has asthma, and is concerned because she developed bronchitis about 9 weeks ago that lasted about 7 weeks before resolving.  She notes that her last chest X-ray was about 05/21/17.  Review of that X-ray revealed no acute changes.  She notes that her husband has developed new respiratory symptoms during the past 4 to 5 days.   The history is provided by the patient.    Past Medical History:  Diagnosis Date  . Asthma   . CAP (community acquired pneumonia) 08/2012   Dr Birdie Riddle  . Diverticulitis   . Diverticulosis   . Fasting hyperglycemia 04/2011   FBS 108  . Fibromyalgia   . Fundic gland polyps of stomach, benign   . GERD (gastroesophageal reflux disease)    gastric polyp x3  . Glaucoma     Dr Bing Plume  . IBS (irritable bowel syndrome)   . Osteopenia    last 07/2011  . Pneumonia     OP as child  . Shingles     Patient Active Problem List   Diagnosis Date Noted  . Erythema nodosum 06/09/2016  . Abscess of groin, left 11/10/2014  . Abscess of left groin 11/10/2014  . Cellulitis 10/20/2014  . Herpes zoster 09/23/2013  . Superficial bruising of finger 02/16/2013  . Hyperlipidemia 11/01/2011  . Palpitations 05/23/2011  . Essential hypertension 04/23/2011  . Cough 04/09/2011  . THYROID FUNCTION TEST, ABNORMAL 12/20/2009  . COLONIC POLYPS, HX OF 11/08/2008  . Asthma 01/21/2008  . GERD 01/21/2008  . IRRITABLE BOWEL SYNDROME  01/21/2008  . FIBROMYALGIA 01/21/2008  . DEFICIENCY, VITAMIN D NOS 04/02/2007  . DEGENERATIVE DISC DISEASE 04/02/2007  . GLAUCOMA, LOW TENSION 02/06/2007  . Osteoporosis 02/06/2007    Past Surgical History:  Procedure Laterality Date  . COLONOSCOPY W/ POLYPECTOMY  [phone]   last  colonoscopy 2007, Dr Carlean Purl  . ESOPHAGOGASTRODUODENOSCOPY    . SPINE SURGERY     T9-L5 fusions    OB History    No data available       Home Medications    Prior to Admission medications   Medication Sig Start Date End Date Taking? Authorizing Provider  acetaminophen (TYLENOL) 500 MG tablet Take 500 mg by mouth as needed.    [provider]  albuterol (PROVENTIL HFA;VENTOLIN HFA) 108 (90 Base) MCG/ACT inhaler Inhale 2 puffs into the lungs every 6 (six) hours as needed for wheezing or shortness of breath. As needed only 12/10/16   Carollee Herter, Alferd Apa, DO  amLODipine (NORVASC) 2.5 MG tablet Take 1 tablet (2.5 mg total) by mouth daily. 07/02/17   Roma Schanz R, DO  bifidobacterium infantis (ALIGN) capsule Take 1 capsule by mouth daily. 12/29/12   Gatha Mayer, MD  BLACK ELDERBERRY,BERRY-FLOWER, PO Take by mouth. Reported on 03/15/2016    [provider]  budesonide-formoterol (SYMBICORT) 80-4.5 MCG/ACT inhaler Inhale 2 puffs into the  lungs 2 (two) times daily. 06/17/17   Debbrah Alar, NP  carboxymethylcellulose (REFRESH PLUS) 0.5 % SOLN 1 drop 3 (three) times daily as needed.    [provider]  cholecalciferol (VITAMIN D) 1000 units tablet Take 1 capsule by mouth daily. 12/24/16   [provider]  colchicine 0.6 MG tablet Take 1 tablet (0.6 mg total) by mouth 2 (two) times daily. 1 po bid x 2-3 days then take 1 a day Patient not taking: Reported on 07/17/2017 05/24/16   Carollee Herter, Alferd Apa, DO  EPINEPHrine (EPI-PEN) 0.3 mg/0.3 mL DEVI Inject 0.3 mLs (0.3 mg total) into the muscle once. Patient not taking: Reported on 07/17/2017 05/28/11   Parrett,  Fonnie Mu, NP  esomeprazole (NEXIUM) 40 MG capsule Take 40 mg by mouth as needed. Reported on 03/15/2016    [provider]  Flaxseed, Linseed, (GROUND FLAX SEEDS PO) Take 1 to 1 1/2  Tablespoonful daily 02/23/16   [provider]  fluticasone (FLONASE) 50 MCG/ACT nasal spray INSTILL 2 SPRAYS IN EACH NOSTRIL ONCE DAILY 06/22/16   Carollee Herter, Alferd Apa, DO  GuaiFENesin (MUCINEX PO) Take by mouth as needed. Reported on 03/15/2016    [provider]  hydrOXYzine (ATARAX/VISTARIL) 10 MG tablet Take 1 tablet (10 mg total) by mouth 3 (three) times daily as needed. 10/09/16   [name], DO  loratadine (CLARITIN) 10 MG tablet Take 10 mg by mouth daily.    [provider]  metoprolol tartrate (LOPRESSOR) 25 MG tablet TAKE 1 TABLET BY MOUTH TWO  TIMES DAILY AND AN  ADDITIONAL TABLET ONCE  DAILY AS NEEDED 06/17/17   Debbrah Alar, NP  montelukast (SINGULAIR) 10 MG tablet Take 1 tablet (10 mg total) by mouth daily. 03/25/17   [name], DO  Multiple Minerals-Vitamins (CALCIUM CITRATE PLUS/MAGNESIUM PO) Take by mouth.    [provider]  nystatin cream (MYCOSTATIN) Apply 1 application topically 2 (two) times daily. 02/22/15   [name], DO  Omega-3 Fatty Acids (FISH OIL PO) Take by mouth.    [provider]  omeprazole (PRILOSEC) 40 MG capsule TAKE 1 CAPSULE BY MOUTH  DAILY 08/13/16   Gatha Mayer, MD  ranitidine (ZANTAC) 150 MG tablet Take 150 mg by mouth at bedtime.     [provider]  sodium chloride (OCEAN) 0.65 % SOLN nasal spray Place 1 spray into both nostrils as needed for congestion.    [provider]  travoprost, benzalkonium, (TRAVATAN) 0.004 % ophthalmic solution Place 1 drop into both eyes at bedtime.    [provider]  triamcinolone cream (KENALOG) 0.1 % Apply 1 application topically 2 (two) times daily. 03/30/16   Saguier, Percell Miller, PA-C  Vitamin Mixture (ESTER-C PO) Take 1,000 mg by  mouth daily.    [provider]    Family History Family History  Problem Relation Age of Onset  . Breast cancer Mother   . Breast cancer Sister   . Breast cancer Maternal Aunt        two  . Breast cancer Paternal Aunt   . Asthma Paternal Aunt   . Heart failure Father        CHF  . Kidney disease Maternal Grandmother   . Ovarian cancer Unknown        Niece x2  . Breast cancer Unknown        niece  . Coronary artery disease Paternal Aunt        triple  CABG  . Heart attack Paternal Grandmother        MI in late 8s  . Heart disease Paternal Grandfather   . Heart attack Paternal Uncle         MI in 84s  . Heart disease Maternal Aunt   . Pulmonary embolism Son   . Deep vein thrombosis Son   . Diabetes Neg Hx   . Stroke Neg Hx   . COPD Neg Hx   . Colon polyps Neg Hx     Social History Social History   Tobacco Use  . Smoking status: Former Smoker    Packs/day: 0.50    Years: 10.00    Pack years: 5.00    Types: Cigarettes    Last attempt to quit: 09/10/1974    Years since quitting: 42.9  . Smokeless tobacco: Never Used  . Tobacco comment: smoked 1966- 1976, up to 1 ppd  Substance Use Topics  . Alcohol use: No    Alcohol/week: 0.0 oz  . Drug use: No     Allergies   Cephalosporins; Gabapentin; Levofloxacin; Psyllium; Sulfonamide derivatives; Aspirin; Belladonna; Ciprofloxacin hcl; Conjugated estrogens; Doxycycline; Nabumetone; Nsaids; Soybean-containing drug products; Zicam cold remedy [erysidoron #1]; Zocor [simvastatin]; and Prednisone   Review of Systems Review of Systems + sore throat + cough No pleuritic pain No wheezing + nasal congestion + post-nasal drainage No sinus pain/pressure No itchy/red eyes No earache No hemoptysis No SOB No fever, ? chills No nausea No vomiting No abdominal pain No diarrhea No urinary symptoms No skin rash + fatigue No myalgias No headache Used OTC meds without relief   Physical Exam Triage Vital  Signs ED Triage Vitals  Enc Vitals Group     BP 08/04/17 1423 (!) 151/74     Pulse Rate 08/04/17 1423 65     Resp 08/04/17 1423 16     Temp 08/04/17 1423 98.8 F (37.1 C)     Temp Source 08/04/17 1423 Oral     SpO2 08/04/17 1423 100 %     Weight 08/04/17 1424 151 lb (68.5 kg)     Height 08/04/17 1424 5\' 2"  (1.575 m)     Head Circumference --      Peak Flow --      Pain Score 08/04/17 1424 0     Pain Loc --      Pain Edu? --      Excl. in [place]? --    No data found.  Updated Vital Signs BP (!) 151/74 (BP Location: Left Arm)   Pulse 65   Temp 98.8 F (37.1 C) (Oral)   Resp 16   Ht 5\' 2"  (1.575 m)   Wt 151 lb (68.5 kg)   SpO2 100%   BMI 27.62 kg/m   Visual Acuity Right Eye Distance:   Left Eye Distance:   Bilateral Distance:    Right Eye Near:   Left Eye Near:    Bilateral Near:     Physical Exam Nursing notes and Vital Signs reviewed. Appearance:  Patient appears stated age, and in no acute distress Eyes:  Pupils are equal, round, and reactive to light and accomodation.  Extraocular movement is intact.  Conjunctivae are not inflamed  Ears:  Canals normal.  Tympanic membranes normal.  Nose:  Mildly congested turbinates.  No sinus tenderness.    Pharynx:  Normal Neck:  Supple.  Enlarged posterior/lateral nodes are palpated bilaterally, tender to palpation on the left.   Lungs:  Clear to auscultation.  Breath sounds are equal.  Moving air well. Heart:  Regular rate and rhythm without murmurs, rubs, or gallops.  Abdomen:  Nontender without masses or hepatosplenomegaly.  Bowel sounds are present.  No CVA or flank tenderness.  Extremities:  No edema.  Skin:  No rash present.    UC Treatments / Results  Labs (all labs ordered are listed, but only abnormal results are displayed) Labs Reviewed - No data to display  EKG  EKG Interpretation None       Radiology Dg Chest 2 View  Result Date: 08/04/2017 CLINICAL DATA:  Sore throat and hemoptysis since yesterday.  EXAM: CHEST  2 VIEW COMPARISON:  PA and lateral chest 05/21/2017 and 05/24/2016. FINDINGS: The lungs are clear. Heart size is normal. No pneumothorax or pleural effusion. The patient is status post thoracolumbar fusion. Stabilization rod on the left is broken, unchanged. IMPRESSION: No active cardiopulmonary disease. Electronically Signed   By: Inge Rise M.D.   On: 08/04/2017 15:22    Procedures Procedures (including critical care time)  Medications Ordered in UC Medications - No data to display   Initial Impression / Assessment and Plan / UC Course  I have reviewed the triage vital signs and the nursing notes.  Pertinent labs & imaging results that were available during my care of the patient were reviewed by me and considered in my medical decision making (see chart for details).    No acute findings on today's chest x-ray reassuring. Suspect that patient is developing an early viral URI. There is no evidence of bacterial infection today.   Treat symptomatically for now:  Take plain guaifenesin (600 or1200mg  extended release tabs such as Mucinex) twice daily, with plenty of water, for cough and congestion.   Get adequate rest.   Also recommend using saline nasal spray several times daily and saline nasal irrigation (AYR is a common brand).  Use Flonase nasal spray each morning after using saline nasal irrigation. Try warm salt water gargles for sore throat.  Stop all antihistamines for now, and other non-prescription cough/cold preparations. Continue all inhalers as prescribed. May take Delsym Cough Suppressant at bedtime for nighttime cough.  Followup with Family Doctor if not improved in one week.     Final Clinical Impressions(s) / UC Diagnoses   Final diagnoses:  Viral URI    ED Discharge Orders    None           Kandra Nicolas, MD 08/05/17 1324

## 2017-08-15 ENCOUNTER — Other Ambulatory Visit (INDEPENDENT_AMBULATORY_CARE_PROVIDER_SITE_OTHER): Payer: Medicare Other

## 2017-08-15 DIAGNOSIS — I10 Essential (primary) hypertension: Secondary | ICD-10-CM

## 2017-08-15 LAB — COMPREHENSIVE METABOLIC PANEL
ALT: 14 U/L (ref 0–35)
AST: 20 U/L (ref 0–37)
Albumin: 4.4 g/dL (ref 3.5–5.2)
Alkaline Phosphatase: 84 U/L (ref 39–117)
BILIRUBIN TOTAL: 0.6 mg/dL (ref 0.2–1.2)
BUN: 11 mg/dL (ref 6–23)
CO2: 30 meq/L (ref 19–32)
Calcium: 9.2 mg/dL (ref 8.4–10.5)
Chloride: 102 mEq/L (ref 96–112)
Creatinine, Ser: 0.67 mg/dL (ref 0.40–1.20)
GFR: 91.97 mL/min (ref >60.00)
GLUCOSE: 78 mg/dL (ref 70–99)
Potassium: 4.1 mEq/L (ref 3.5–5.1)
SODIUM: 139 meq/L (ref 135–145)
Total Protein: 6.6 g/dL (ref 6.0–8.3)

## 2017-08-15 LAB — LIPID PANEL
CHOL/HDL RATIO: 3
CHOLESTEROL: 184 mg/dL (ref 0–200)
HDL: 64.7 mg/dL (ref >39.00)
LDL Cholesterol: 103 mg/dL — ABNORMAL HIGH (ref 0–99)
NonHDL: 118.85
TRIGLYCERIDES: 78 mg/dL (ref 0.0–149.0)
VLDL: 15.6 mg/dL (ref 0.0–40.0)

## 2017-08-15 LAB — VITAMIN D 25 HYDROXY (VIT D DEFICIENCY, FRACTURES): VITD: 38.64 ng/mL (ref 30.00–100.00)

## 2017-08-22 ENCOUNTER — Encounter: Payer: Self-pay | Admitting: Family Medicine

## 2017-08-22 ENCOUNTER — Telehealth: Payer: Self-pay | Admitting: Internal Medicine

## 2017-08-22 MED ORDER — OMEPRAZOLE 40 MG PO CPDR: 40.0000 mg | DELAYED_RELEASE_CAPSULE | Freq: Every day | ORAL | 0 refills | Status: DC

## 2017-08-22 NOTE — Telephone Encounter (Signed)
Omeprazole rx sent as requested.

## 2017-08-23 ENCOUNTER — Other Ambulatory Visit: Payer: Self-pay | Admitting: Family Medicine

## 2017-08-23 DIAGNOSIS — J302 Other seasonal allergic rhinitis: Secondary | ICD-10-CM

## 2017-10-10 ENCOUNTER — Ambulatory Visit: Payer: Medicare Other | Admitting: Internal Medicine

## 2017-10-10 ENCOUNTER — Other Ambulatory Visit: Payer: Self-pay | Admitting: Family Medicine

## 2017-10-10 ENCOUNTER — Other Ambulatory Visit: Payer: Self-pay | Admitting: Internal Medicine

## 2017-10-16 ENCOUNTER — Encounter: Payer: Self-pay | Admitting: Internal Medicine

## 2017-10-16 ENCOUNTER — Ambulatory Visit: Payer: Medicare Other | Admitting: Internal Medicine

## 2017-10-16 VITALS — BP 136/68 | HR 70 | Ht 62.0 in | Wt 156.0 lb

## 2017-10-16 DIAGNOSIS — K219 Gastro-esophageal reflux disease without esophagitis: Secondary | ICD-10-CM

## 2017-10-16 DIAGNOSIS — M818 Other osteoporosis without current pathological fracture: Secondary | ICD-10-CM

## 2017-10-16 NOTE — Progress Notes (Signed)
Kayla [name] 72 y.o. Apr 01, 1946 [id number]  Assessment & Plan:   Encounter Diagnoses  Name Primary?  . Gastroesophageal reflux disease, esophagitis presence not specified Yes  . Other osteoporosis, unspecified pathological fracture presence     She will continue daily (mostly) PPI - omeprazole to treat GERD.  She does not want to take bisphosphonates. She understands risks/benefits of PPI - and chooses to continue. She is aware that there is an association of bone fractures with PPi use in observational studies and that we cannot say there is a real causative effect from PPI  She will see me prn  BM:WUXLK Cheri Rous, Alferd Apa, DO   Subjective:   Chief Complaint: GERD - PPI use ?  HPI Does well on 40 mg omeprazole taking most days. 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  She tried to stop PPI but was not able.   We discussed pros and cons of the PPI use and how it controls her sxs.  We discussed the association but not proven causality of bone fractures with PPI use. Allergies  Allergen Reactions  . Cephalosporins     Blisters  orally  . Gabapentin     REACTION: rash  . Levofloxacin     REACTION: stomach ache and rash  . Psyllium     REACTION: rash  . Sulfonamide Derivatives Anaphylaxis    REACTION: shock, urticaria  . Aspirin     REACTION: stomach pain  . Belladonna   . Ciprofloxacin Hcl   . Conjugated Estrogens   . Doxycycline     Abdominal pain  . Nabumetone   . Nsaids   . Soybean-Containing Drug Products Other (See Comments)    GI upset  . Zicam Cold Remedy [Erysidoron #1]   . Zocor [Simvastatin] Itching  . Prednisone Rash    NO PROBLEM WITH MEDROL DOSE PAK Oral prednisone caused facial burning, "made my face feel like it was on fire"   Current Meds  Medication Sig  . acetaminophen  (TYLENOL) 500 MG tablet Take 500 mg by mouth as needed.  Marland Kitchen albuterol (PROVENTIL HFA;VENTOLIN HFA) 108 (90 Base) MCG/ACT inhaler Inhale 2 puffs into the lungs every 6 (six) hours as needed for wheezing or shortness of breath. As needed only  . amLODipine (NORVASC) 2.5 MG tablet Take 1 tablet (2.5 mg total) by mouth daily.  . bifidobacterium infantis (ALIGN) capsule Take 1 capsule by mouth daily.  Marland Kitchen BLACK ELDERBERRY,BERRY-FLOWER, PO Take by mouth. Reported on 03/15/2016  . budesonide-formoterol (SYMBICORT) 80-4.5 MCG/ACT inhaler Inhale 2 puffs into the lungs 2 (two) times daily.  . carboxymethylcellulose (REFRESH PLUS) 0.5 % SOLN 1 drop 3 (three) times daily as needed.  . cholecalciferol (VITAMIN D) 1000 units tablet Take 1 capsule by mouth daily.  . colchicine 0.6 MG tablet Take 1 tablet (0.6 mg total) by mouth 2 (two) times daily. 1 po bid x 2-3 days then take 1 a day  . EPINEPHrine (EPI-PEN) 0.3 mg/0.3 mL DEVI Inject 0.3 mLs (0.3 mg total) into the muscle once.  Marland Kitchen esomeprazole (NEXIUM) 40 MG capsule Take 40 mg by mouth as needed. Reported on 03/15/2016  . Flaxseed, Linseed, (GROUND FLAX SEEDS PO) Take 1 to 1 1/2  Tablespoonful daily  . fluticasone (FLONASE) 50 MCG/ACT nasal spray USE 2 SPRAYS IN EACH  NOSTRIL ONCE A DAY  . GuaiFENesin (  MUCINEX PO) Take by mouth as needed. Reported on 03/15/2016  . hydrOXYzine (ATARAX/VISTARIL) 10 MG tablet Take 1 tablet (10 mg total) by mouth 3 (three) times daily as needed.  . loratadine (CLARITIN) 10 MG tablet Take 10 mg by mouth daily.  . metoprolol tartrate (LOPRESSOR) 25 MG tablet TAKE 1 TABLET BY MOUTH TWO  TIMES DAILY AND AN  ADDITIONAL TABLET ONCE  DAILY AS NEEDED  . montelukast (SINGULAIR) 10 MG tablet TAKE 1 TABLET(10 MG) BY MOUTH DAILY  . Multiple Minerals-Vitamins (CALCIUM CITRATE PLUS/MAGNESIUM PO) Take by mouth.  . nystatin cream (MYCOSTATIN) Apply 1 application topically 2 (two) times daily.  . Omega-3 Fatty Acids (FISH OIL PO) Take by mouth.  Marland Kitchen  omeprazole (PRILOSEC) 40 MG capsule TAKE 1 CAPSULE BY MOUTH  DAILY  . Polyethyl Glycol-Propyl Glycol (SYSTANE) 0.4-0.3 % GEL ophthalmic gel Place 1 application into both eyes.  . ranitidine (ZANTAC) 150 MG tablet Take 150 mg by mouth at bedtime.   . sodium chloride (OCEAN) 0.65 % SOLN nasal spray Place 1 spray into both nostrils as needed for congestion.  . travoprost, benzalkonium, (TRAVATAN) 0.004 % ophthalmic solution Place 1 drop into both eyes at bedtime.  . triamcinolone cream (KENALOG) 0.1 % Apply 1 application topically 2 (two) times daily.  . Vitamin Mixture (ESTER-C PO) Take 1,000 mg by mouth daily.   Past Medical History:  Diagnosis Date  . Asthma   . CAP (community acquired pneumonia) 08/2012   Dr Birdie Riddle  . Diverticulitis   . Diverticulosis   . Fasting hyperglycemia 04/2011   FBS 108  . Fibromyalgia   . Fundic gland polyps of stomach, benign   . GERD (gastroesophageal reflux disease)    gastric polyp x3  . Glaucoma     Dr Bing Plume  . IBS (irritable bowel syndrome)   . Osteopenia    last 07/2011  . Pneumonia     OP as child  . Shingles    Past Surgical History:  Procedure Laterality Date  . COLONOSCOPY W/ POLYPECTOMY  [phone]   last  colonoscopy 2007, Dr Carlean Purl  . ESOPHAGOGASTRODUODENOSCOPY    . SPINE SURGERY     T9-L5 fusions   Social History   Social History Narrative   Daily caffeine 3 cups   Regular exercise   Married         family history includes Asthma in her paternal aunt; Breast cancer in her maternal aunt, mother, paternal aunt, sister, and unknown relative; Coronary artery disease in her paternal aunt; Deep vein thrombosis in her son; Heart attack in her paternal grandmother and paternal uncle; Heart disease in her maternal aunt and paternal grandfather; Heart failure in her father; Kidney disease in her maternal grandmother; Ovarian cancer in her unknown relative; Pulmonary embolism in her son.   Review of Systems As per  HPI  Objective:   Physical Exam BP 136/68   Pulse 70   Ht 5\' 2"  (1.575 m)   Wt 156 lb (70.8 kg)   BMI 28.53 kg/m  NAD  15 minutes time spent with patient > half in counseling coordination of care

## 2017-10-16 NOTE — Patient Instructions (Signed)
  Continue your medicines and follow up with Dr Carlean Purl as needed.     I appreciate the opportunity to care for you. Silvano Rusk, MD, [facility]

## 2017-11-13 ENCOUNTER — Encounter: Payer: Self-pay | Admitting: Family Medicine

## 2017-11-13 MED ORDER — MONTELUKAST SODIUM 10 MG PO TABS: 10.0000 mg | ORAL_TABLET | Freq: Every day | ORAL | 3 refills | Status: DC

## 2017-11-14 ENCOUNTER — Ambulatory Visit (INDEPENDENT_AMBULATORY_CARE_PROVIDER_SITE_OTHER): Payer: Medicare Other | Admitting: Medical

## 2017-11-14 ENCOUNTER — Encounter: Payer: Self-pay | Admitting: Medical

## 2017-11-14 ENCOUNTER — Ambulatory Visit (HOSPITAL_BASED_OUTPATIENT_CLINIC_OR_DEPARTMENT_OTHER)
Admission: RE | Admit: 2017-11-14 | Discharge: 2017-11-14 | Disposition: A | Discharge status: Home / Self Care | Payer: Medicare Other | Source: Ambulatory Visit | Attending: Medical | Admitting: Medical

## 2017-11-14 VITALS — BP 142/60 | HR 63 | Temp 98.4°F | Resp 16 | Ht 62.0 in | Wt 156.2 lb

## 2017-11-14 DIAGNOSIS — J40 Bronchitis, not specified as acute or chronic: Secondary | ICD-10-CM

## 2017-11-14 DIAGNOSIS — R059 Cough, unspecified: Secondary | ICD-10-CM

## 2017-11-14 DIAGNOSIS — J0100 Acute maxillary sinusitis, unspecified: Secondary | ICD-10-CM

## 2017-11-14 DIAGNOSIS — R05 Cough: Secondary | ICD-10-CM | POA: Diagnosis not present

## 2017-11-14 DIAGNOSIS — R062 Wheezing: Secondary | ICD-10-CM

## 2017-11-14 MED ORDER — AZELASTINE HCL 0.1 % NA SOLN: 2.0000 | Freq: Two times a day (BID) | NASAL | 12 refills | Status: DC

## 2017-11-14 MED ORDER — AZITHROMYCIN 250 MG PO TABS: ORAL_TABLET | ORAL | 0 refills | Status: DC

## 2017-11-14 NOTE — Patient Instructions (Addendum)
You appear to have bronchitis and sinusitis. Rest hydrate and tylenol for fever. For cough can use robitussin. Rx azithromycin. . For your nasal congestion rx astelin. Please get cxr today.  Continue albuterol. If having to use frequently then might consider low dose prednisone.  Follow up in 7-10 days or as needed

## 2017-11-14 NOTE — Progress Notes (Signed)
Subjective:    Patient ID: Kayla [name], female    DOB: 07/25/1946, 72 y.o.   MRN: [id number]  HPI  Pt in for evaluation.   She has had recent cough and chest congestion for 7-9 days.  Pt feels like has some mucus to bring up but has some difficulty bringing up mucus.   Pt states some brief airway tight feeling last night. She took proair and that helped a lot/resolved that sensation.  Some bilateral posterior lower rib area pain at times when she breathes deeply  Also some nasal congestion and some sinus pressure.     Review of Systems  Constitutional: Positive for fatigue. Negative for chills and fever.       Maybe mild chills at times.  HENT: Negative for congestion, ear discharge, ear pain, facial swelling, nosebleeds, postnasal drip, sinus pressure and sinus pain.   Respiratory: Positive for cough and wheezing. Negative for apnea, choking and shortness of breath.        Minimal wheeze tight feel but responded to proair.  Feels like she needs to bring up mucus at times but cannot.  She demonstrates effort to bring up mucus and it does sound a little bit wet.  Cardiovascular: Negative for chest pain and palpitations.  Gastrointestinal: Negative for abdominal pain.  Musculoskeletal:       Some mid upper back associated with chest congestion.  Skin: Negative for rash.  Neurological: Negative for dizziness, syncope, speech difficulty, weakness, light-headedness and headaches.  Hematological: Negative for adenopathy. Does not bruise/bleed easily.  Psychiatric/Behavioral: Negative for behavioral problems, confusion, hallucinations, sleep disturbance and suicidal ideas. The patient is not nervous/anxious.     Past Medical History:  Diagnosis Date  . Asthma   . CAP (community acquired pneumonia) 08/2012   Dr Birdie Riddle  . Diverticulitis   . Diverticulosis   . Fasting hyperglycemia 04/2011   FBS 108  . Fibromyalgia   . Fundic gland polyps of stomach, benign   . GERD  (gastroesophageal reflux disease)    gastric polyp x3  . Glaucoma     Dr Bing Plume  . IBS (irritable bowel syndrome)   . Osteopenia    last 07/2011  . Pneumonia     OP as child  . Shingles      Social History   Socioeconomic History  . Marital status: Married    Spouse name: Not on file  . Number of children: Not on file  . Years of education: Not on file  . Highest education level: Not on file  Social Needs  . Financial resource strain: Not on file  . Food insecurity - worry: Not on file  . Food insecurity - inability: Not on file  . Transportation needs - medical: Not on file  . Transportation needs - non-medical: Not on file  Occupational History  . Not on file  Tobacco Use  . Smoking status: Former Smoker    Packs/day: 0.50    Years: 10.00    Pack years: 5.00    Types: Cigarettes    Last attempt to quit: 09/10/1974    Years since quitting: 43.2  . Smokeless tobacco: Never Used  . Tobacco comment: smoked 1966- 1976, up to 1 ppd  Substance and Sexual Activity  . Alcohol use: No    Alcohol/week: 0.0 oz  . Drug use: No  . Sexual activity: Yes  Other Topics Concern  . Not on file  Social History Narrative   Daily caffeine 3 cups  Regular exercise   Married          Past Surgical History:  Procedure Laterality Date  . COLONOSCOPY W/ POLYPECTOMY  [phone]   last  colonoscopy 2007, Dr Carlean Purl  . ESOPHAGOGASTRODUODENOSCOPY    . SPINE SURGERY     T9-L5 fusions    Family History  Problem Relation Age of Onset  . Breast cancer Mother   . Breast cancer Sister   . Breast cancer Maternal Aunt        two  . Breast cancer Paternal Aunt   . Asthma Paternal Aunt   . Heart failure Father        CHF  . Kidney disease Maternal Grandmother   . Ovarian cancer Unknown        Niece x2  . Breast cancer Unknown        niece  . Coronary artery disease Paternal Aunt        triple CABG  . Heart attack Paternal Grandmother        MI in late 55s  . Heart disease  Paternal Grandfather   . Heart attack Paternal Uncle         MI in 63s  . Heart disease Maternal Aunt   . Pulmonary embolism Son   . Deep vein thrombosis Son   . Diabetes Neg Hx   . Stroke Neg Hx   . COPD Neg Hx   . Colon polyps Neg Hx     Allergies  Allergen Reactions  . Cephalosporins     Blisters  orally  . Gabapentin     REACTION: rash  . Levofloxacin     REACTION: stomach ache and rash  . Psyllium     REACTION: rash  . Sulfonamide Derivatives Anaphylaxis    REACTION: shock, urticaria  . Aspirin     REACTION: stomach pain  . Belladonna   . Ciprofloxacin Hcl   . Conjugated Estrogens   . Doxycycline     Abdominal pain  . Nabumetone   . Nsaids   . Soybean-Containing Drug Products Other (See Comments)    GI upset  . Zicam Cold Remedy [Erysidoron #1]   . Zocor [Simvastatin] Itching  . Prednisone Rash    NO PROBLEM WITH MEDROL DOSE PAK Oral prednisone caused facial burning, "made my face feel like it was on fire"    Current Outpatient Medications on File Prior to Visit  Medication Sig Dispense Refill  . acetaminophen (TYLENOL) 500 MG tablet Take 500 mg by mouth as needed.    Marland Kitchen albuterol (PROVENTIL HFA;VENTOLIN HFA) 108 (90 Base) MCG/ACT inhaler Inhale 2 puffs into the lungs every 6 (six) hours as needed for wheezing or shortness of breath. As needed only 1 Inhaler 3  . amLODipine (NORVASC) 2.5 MG tablet Take 1 tablet (2.5 mg total) by mouth daily. 90 tablet 1  . bifidobacterium infantis (ALIGN) capsule Take 1 capsule by mouth daily. 21 capsule 0  . BLACK ELDERBERRY,BERRY-FLOWER, PO Take by mouth. Reported on 03/15/2016    . budesonide-formoterol (SYMBICORT) 80-4.5 MCG/ACT inhaler Inhale 2 puffs into the lungs 2 (two) times daily. 1 Inhaler 3  . carboxymethylcellulose (REFRESH PLUS) 0.5 % SOLN 1 drop 3 (three) times daily as needed.    . cholecalciferol (VITAMIN D) 1000 units tablet Take 1 capsule by mouth daily.    . colchicine 0.6 MG tablet Take 1 tablet (0.6 mg  total) by mouth 2 (two) times daily. 1 po bid x 2-3 days then take 1 a  day 30 tablet 0  . EPINEPHrine (EPI-PEN) 0.3 mg/0.3 mL DEVI Inject 0.3 mLs (0.3 mg total) into the muscle once. 1 Device 0  . Flaxseed, Linseed, (GROUND FLAX SEEDS PO) Take 1 to 1 1/2  Tablespoonful daily    . fluticasone (FLONASE) 50 MCG/ACT nasal spray USE 2 SPRAYS IN EACH  NOSTRIL ONCE A DAY 48 g 3  . GuaiFENesin (MUCINEX PO) Take by mouth as needed. Reported on 03/15/2016    . hydrOXYzine (ATARAX/VISTARIL) 10 MG tablet Take 1 tablet (10 mg total) by mouth 3 (three) times daily as needed. 30 tablet 0  . loratadine (CLARITIN) 10 MG tablet Take 10 mg by mouth daily.    . metoprolol tartrate (LOPRESSOR) 25 MG tablet TAKE 1 TABLET BY MOUTH TWO  TIMES DAILY AND AN  ADDITIONAL TABLET ONCE  DAILY AS NEEDED 270 tablet 1  . montelukast (SINGULAIR) 10 MG tablet Take 1 tablet (10 mg total) by mouth at bedtime. 90 tablet 3  . Multiple Minerals-Vitamins (CALCIUM CITRATE PLUS/MAGNESIUM PO) Take by mouth.    . nystatin cream (MYCOSTATIN) Apply 1 application topically 2 (two) times daily. 90 g 3  . Omega-3 Fatty Acids (FISH OIL PO) Take by mouth.    Marland Kitchen omeprazole (PRILOSEC) 40 MG capsule TAKE 1 CAPSULE BY MOUTH  DAILY 90 capsule 0  . Polyethyl Glycol-Propyl Glycol (SYSTANE) 0.4-0.3 % GEL ophthalmic gel Place 1 application into both eyes.    . ranitidine (ZANTAC) 150 MG tablet Take 150 mg by mouth at bedtime.     . sodium chloride (OCEAN) 0.65 % SOLN nasal spray Place 1 spray into both nostrils as needed for congestion.    . travoprost, benzalkonium, (TRAVATAN) 0.004 % ophthalmic solution Place 1 drop into both eyes at bedtime.    . triamcinolone cream (KENALOG) 0.1 % Apply 1 application topically 2 (two) times daily. 90 g 3  . Vitamin Mixture (ESTER-C PO) Take 1,000 mg by mouth daily.     No current facility-administered medications on file prior to visit.     BP (!) 142/60   Pulse 63   Temp 98.4 F (36.9 C) (Oral)   Resp 16   Ht 5'  2" (1.575 m)   Wt 156 lb 3.2 oz (70.9 kg)   SpO2 100%   BMI 28.57 kg/m       Objective:   Physical Exam  General  Mental Status - Alert. General Appearance - Well groomed. Not in acute distress.  Skin Rashes- No Rashes.  HEENT Head- Normal. Ear Auditory Canal - Left- Normal. Right - Normal.Tympanic Membrane- Left- Normal. Right- Normal. Eye Sclera/Conjunctiva- Left- Normal. Right- Normal. Nose & Sinuses Nasal Mucosa- Left-  Boggy and Congested. Right-  Boggy and  Congested.Bilateral  Faint maxillary and frontal sinus pressure. Mouth & Throat Lips: Upper Lip- Normal: no dryness, cracking, pallor, cyanosis, or vesicular eruption. Lower Lip-Normal: no dryness, cracking, pallor, cyanosis or vesicular eruption. Buccal Mucosa- Bilateral- No Aphthous ulcers. Oropharynx- No Discharge or Erythema. Tonsils: Characteristics- Bilateral- No Erythema or Congestion. Size/Enlargement- Bilateral- No enlargement. Discharge- bilateral-None.  Neck Neck- Supple. No Masses.   Chest and Lung Exam Auscultation: Breath Sounds:-Clear even and unlabored. At base of lungs bilaterally mild rough breath sounds.  Cardiovascular Auscultation:Rythm- Regular, rate and rhythm. Murmurs & Other Heart Sounds:Ausculatation of the heart reveal- No Murmurs.  Lymphatic Head & Neck General Head & Neck Lymphatics: Bilateral: Description- No Localized lymphadenopathy.  Lower ext- no pedal edema. Negative homans sign bilaterally.  Assessment & Plan:  You appear to have bronchitis and sinusitis. Rest hydrate and tylenol for fever. For cough can use robitussin. Rx azithromycin. . For your nasal congestion rx flonase.  Please get cxr today.  Continue albuterol. If having to use frequently then might consider low dose prednisone.  Follow up in 7-10 days or as needed

## 2017-11-21 DIAGNOSIS — Z803 Family history of malignant neoplasm of breast: Secondary | ICD-10-CM | POA: Diagnosis not present

## 2017-11-21 DIAGNOSIS — Z1231 Encounter for screening mammogram for malignant neoplasm of breast: Secondary | ICD-10-CM | POA: Diagnosis not present

## 2017-11-21 DIAGNOSIS — M150 Primary generalized (osteo)arthritis: Secondary | ICD-10-CM | POA: Diagnosis not present

## 2017-11-21 DIAGNOSIS — M797 Fibromyalgia: Secondary | ICD-10-CM | POA: Diagnosis not present

## 2017-11-21 DIAGNOSIS — R768 Other specified abnormal immunological findings in serum: Secondary | ICD-10-CM | POA: Diagnosis not present

## 2018-01-02 ENCOUNTER — Ambulatory Visit (INDEPENDENT_AMBULATORY_CARE_PROVIDER_SITE_OTHER): Payer: Medicare Other | Admitting: Family Medicine

## 2018-01-02 ENCOUNTER — Ambulatory Visit: Payer: Medicare Other | Admitting: Internal Medicine

## 2018-01-02 ENCOUNTER — Encounter: Payer: Self-pay | Admitting: Family Medicine

## 2018-01-02 VITALS — BP 130/70 | HR 59 | Temp 98.3°F | Resp 16 | Ht 62.0 in | Wt 156.8 lb

## 2018-01-02 DIAGNOSIS — I10 Essential (primary) hypertension: Secondary | ICD-10-CM | POA: Diagnosis not present

## 2018-01-02 DIAGNOSIS — Z0000 Encounter for general adult medical examination without abnormal findings: Secondary | ICD-10-CM

## 2018-01-02 DIAGNOSIS — E785 Hyperlipidemia, unspecified: Secondary | ICD-10-CM

## 2018-01-02 DIAGNOSIS — R946 Abnormal results of thyroid function studies: Secondary | ICD-10-CM | POA: Diagnosis not present

## 2018-01-02 DIAGNOSIS — M818 Other osteoporosis without current pathological fracture: Secondary | ICD-10-CM

## 2018-01-02 LAB — COMPREHENSIVE METABOLIC PANEL
ALBUMIN: 4.5 g/dL (ref 3.5–5.2)
ALK PHOS: 84 U/L (ref 39–117)
ALT: 16 U/L (ref 0–35)
AST: 21 U/L (ref 0–37)
BUN: 13 mg/dL (ref 6–23)
CALCIUM: 10.1 mg/dL (ref 8.4–10.5)
CO2: 29 mEq/L (ref 19–32)
Chloride: 102 mEq/L (ref 96–112)
Creatinine, Ser: 0.73 mg/dL (ref 0.40–1.20)
GFR: 83.22 mL/min (ref >60.00)
Glucose, Bld: 88 mg/dL (ref 70–99)
Potassium: 5.3 mEq/L — ABNORMAL HIGH (ref 3.5–5.1)
Sodium: 138 mEq/L (ref 135–145)
TOTAL PROTEIN: 7.1 g/dL (ref 6.0–8.3)
Total Bilirubin: 0.6 mg/dL (ref 0.2–1.2)

## 2018-01-02 LAB — CBC WITH DIFFERENTIAL/PLATELET
BASOS ABS: 0 10*3/uL (ref 0.0–0.1)
Basophils Relative: 0.6 % (ref 0.0–3.0)
Eosinophils Absolute: 0.1 10*3/uL (ref 0.0–0.7)
Eosinophils Relative: 1.4 % (ref 0.0–5.0)
HEMATOCRIT: 41.4 % (ref 36.0–46.0)
Hemoglobin: 14 g/dL (ref 12.0–15.0)
LYMPHS PCT: 19.8 % (ref 12.0–46.0)
Lymphs Abs: 1 10*3/uL (ref 0.7–4.0)
MCHC: 33.9 g/dL (ref 30.0–36.0)
MCV: 89 fl (ref 78.0–100.0)
MONOS PCT: 9.1 % (ref 3.0–12.0)
Monocytes Absolute: 0.4 10*3/uL (ref 0.1–1.0)
NEUTROS ABS: 3.3 10*3/uL (ref 1.4–7.7)
Neutrophils Relative %: 69.1 % (ref 43.0–77.0)
Platelets: 168 10*3/uL (ref 150.0–400.0)
RBC: 4.65 Mil/uL (ref 3.87–5.11)
RDW: 14.3 % (ref 11.5–15.5)
WBC: 4.8 10*3/uL (ref 4.0–10.5)

## 2018-01-02 LAB — LIPID PANEL
Cholesterol: 220 mg/dL — ABNORMAL HIGH (ref 0–200)
HDL: 73.9 mg/dL (ref >39.00)
LDL Cholesterol: 128 mg/dL — ABNORMAL HIGH (ref 0–99)
NonHDL: 145.94
TRIGLYCERIDES: 90 mg/dL (ref 0.0–149.0)
Total CHOL/HDL Ratio: 3
VLDL: 18 mg/dL (ref 0.0–40.0)

## 2018-01-02 LAB — TSH: TSH: 3.24 u[IU]/mL (ref 0.35–4.50)

## 2018-01-02 MED ORDER — AMLODIPINE BESYLATE 2.5 MG PO TABS: 2.5000 mg | ORAL_TABLET | Freq: Every day | ORAL | 1 refills | Status: DC

## 2018-01-02 NOTE — Patient Instructions (Signed)
Preventive Care 73 Years and Older, Female Preventive care refers to lifestyle choices and visits with your health care provider that can promote health and wellness. What does preventive care include?  A yearly physical exam. This is also called an annual well check.  Dental exams once or twice a year.  Routine eye exams. Ask your health care provider how often you should have your eyes checked.  Personal lifestyle choices, including: ? Daily care of your teeth and gums. ? Regular physical activity. ? Eating a healthy diet. ? Avoiding tobacco and drug use. ? Limiting alcohol use. ? Practicing safe sex. ? Taking low-dose aspirin every day. ? Taking vitamin and mineral supplements as recommended by your health care provider. What happens during an annual well check? The services and screenings done by your health care provider during your annual well check will depend on your age, overall health, lifestyle risk factors, and family history of disease. Counseling Your health care provider may ask you questions about your:  Alcohol use.  Tobacco use.  Drug use.  Emotional well-being.  Home and relationship well-being.  Sexual activity.  Eating habits.  History of falls.  Memory and ability to understand (cognition).  Work and work environment.  Reproductive health.  Screening You may have the following tests or measurements:  Height, weight, and BMI.  Blood pressure.  Lipid and cholesterol levels. These may be checked every 5 years, or more frequently if you are over 50 years old.  Skin check.  Lung cancer screening. You may have this screening every year starting at age 55 if you have a 30-pack-year history of smoking and currently smoke or have quit within the past 15 years.  Fecal occult blood test (FOBT) of the stool. You may have this test every year starting at age 50.  Flexible sigmoidoscopy or colonoscopy. You may have a sigmoidoscopy every 5 years or  a colonoscopy every 10 years starting at age 50.  Hepatitis C blood test.  Hepatitis B blood test.  Sexually transmitted disease (STD) testing.  Diabetes screening. This is done by checking your blood sugar (glucose) after you have not eaten for a while (fasting). You may have this done every 1-3 years.  Bone density scan. This is done to screen for osteoporosis. You may have this done starting at age 65.  Mammogram. This may be done every 1-2 years. Talk to your health care provider about how often you should have regular mammograms.  Talk with your health care provider about your test results, treatment options, and if necessary, the need for more tests. Vaccines Your health care provider may recommend certain vaccines, such as:  Influenza vaccine. This is recommended every year.  Tetanus, diphtheria, and acellular pertussis (Tdap, Td) vaccine. You may need a Td booster every 10 years.  Varicella vaccine. You may need this if you have not been vaccinated.  Zoster vaccine. You may need this after age 60.  Measles, mumps, and rubella (MMR) vaccine. You may need at least one dose of MMR if you were born in 1957 or later. You may also need a second dose.  Pneumococcal 13-valent conjugate (PCV13) vaccine. One dose is recommended after age 65.  Pneumococcal polysaccharide (PPSV23) vaccine. One dose is recommended after age 65.  Meningococcal vaccine. You may need this if you have certain conditions.  Hepatitis A vaccine. You may need this if you have certain conditions or if you travel or work in places where you may be exposed to hepatitis   A.  Hepatitis B vaccine. You may need this if you have certain conditions or if you travel or work in places where you may be exposed to hepatitis B.  Haemophilus influenzae type b (Hib) vaccine. You may need this if you have certain conditions.  Talk to your health care provider about which screenings and vaccines you need and how often you  need them. This information is not intended to replace advice given to you by your health care provider. Make sure you discuss any questions you have with your health care provider. Document Released: 09/23/2015 Document Revised: 05/16/2016 Document Reviewed: 06/28/2015 Elsevier Interactive Patient Education  2018 [employer]  

## 2018-01-02 NOTE — Assessment & Plan Note (Signed)
Encouraged heart healthy diet, increase exercise, avoid trans fats, consider a krill oil cap daily 

## 2018-01-02 NOTE — Assessment & Plan Note (Signed)
Weight bearing exercise con't ca and vita d

## 2018-01-02 NOTE — Assessment & Plan Note (Signed)
Per rheum 

## 2018-01-02 NOTE — Assessment & Plan Note (Signed)
Well controlled, no changes to meds. Encouraged heart healthy diet such as the DASH diet and exercise as tolerated.  °

## 2018-01-02 NOTE — Progress Notes (Signed)
Subjective:     Kayla [name] is a 72 y.o. female and is here for a comprehensive physical exam. The patient reports no problems.  Social History   Socioeconomic History  . Marital status: Married    Spouse name: Not on file  . Number of children: Not on file  . Years of education: Not on file  . Highest education level: Not on file  Occupational History  . Not on file  Social Needs  . Financial resource strain: Not on file  . Food insecurity:    Worry: Not on file    Inability: Not on file  . Transportation needs:    Medical: Not on file    Non-medical: Not on file  Tobacco Use  . Smoking status: Former Smoker    Packs/day: 0.50    Years: 10.00    Pack years: 5.00    Types: Cigarettes    Last attempt to quit: 09/10/1974    Years since quitting: 43.3  . Smokeless tobacco: Never Used  . Tobacco comment: smoked 1966- 1976, up to 1 ppd  Substance and Sexual Activity  . Alcohol use: No    Alcohol/week: 0.0 oz  . Drug use: No  . Sexual activity: Yes  Lifestyle  . Physical activity:    Days per week: Not on file    Minutes per session: Not on file  . Stress: Not on file  Relationships  . Social connections:    Talks on phone: Not on file    Gets together: Not on file    Attends religious service: Not on file    Active member of club or organization: Not on file    Attends meetings of clubs or organizations: Not on file    Relationship status: Not on file  . Intimate partner violence:    Fear of current or ex partner: Not on file    Emotionally abused: Not on file    Physically abused: Not on file    Forced sexual activity: Not on file  Other Topics Concern  . Not on file  Social History Narrative   Daily caffeine 3 cups   Regular exercise   Married         Health Maintenance  Topic Date Due  . INFLUENZA VACCINE  04/10/2018  . MAMMOGRAM  11/22/2018  . TETANUS/TDAP  02/16/2025  . COLONOSCOPY  03/15/2026  . DEXA SCAN  Completed  . Hepatitis C Screening   Completed  . PNA vac Low Risk Adult  Completed    The following portions of the patient's history were reviewed and updated as appropriate:  She  has a past medical history of Asthma, CAP (community acquired pneumonia) (08/2012), Diverticulitis, Diverticulosis, Fasting hyperglycemia (04/2011), Fibromyalgia, Fundic gland polyps of stomach, benign, GERD (gastroesophageal reflux disease), Glaucoma, IBS (irritable bowel syndrome), Osteopenia, Pneumonia, and Shingles. She does not have any pertinent problems on file. She  has a past surgical history that includes Spine surgery; Colonoscopy w/ polypectomy (3244,[id number]); and Esophagogastroduodenoscopy. Her family history includes Asthma in her paternal aunt; Breast cancer in her maternal aunt, mother, paternal aunt, sister, and unknown relative; Coronary artery disease in her paternal aunt; Deep vein thrombosis in her son; Heart Problems in her son; Heart attack in her paternal grandmother and paternal uncle; Heart disease in her maternal aunt and paternal grandfather; Heart failure in her father; Kidney disease in her maternal grandmother; Ovarian cancer in her unknown relative; Pulmonary embolism in her son. She  reports that she  quit smoking about 43 years ago. Her smoking use included cigarettes. She has a 5.00 pack-year smoking history. She has never used smokeless tobacco. She reports that she does not drink alcohol or use drugs. She has a current medication list which includes the following prescription(s): acetaminophen, albuterol, amlodipine, azelastine, bifidobacterium infantis, black elderberry(berry-flower), carboxymethylcellulose, cholecalciferol, colchicine, flaxseed (linseed), fluticasone, guaifenesin, hydroxyzine, loratadine, metoprolol tartrate, montelukast, multiple minerals-vitamins, nystatin cream, omega-3 fatty acids, omeprazole, polyethyl glycol-propyl glycol, ranitidine, sodium chloride, travoprost (benzalkonium), triamcinolone cream,  and vitamin mixture. Current Outpatient Medications on File Prior to Visit  Medication Sig Dispense Refill  . acetaminophen (TYLENOL) 500 MG tablet Take 500 mg by mouth as needed.    Marland Kitchen albuterol (PROVENTIL HFA;VENTOLIN HFA) 108 (90 Base) MCG/ACT inhaler Inhale 2 puffs into the lungs every 6 (six) hours as needed for wheezing or shortness of breath. As needed only 1 Inhaler 3  . azelastine (ASTELIN) 0.1 % nasal spray Place 2 sprays into both nostrils 2 (two) times daily. Use in each nostril as directed 30 mL 12  . bifidobacterium infantis (ALIGN) capsule Take 1 capsule by mouth daily. 21 capsule 0  . BLACK ELDERBERRY,BERRY-FLOWER, PO Take by mouth. Reported on 03/15/2016    . carboxymethylcellulose (REFRESH PLUS) 0.5 % SOLN 1 drop 3 (three) times daily as needed.    . cholecalciferol (VITAMIN D) 1000 units tablet Take 1 capsule by mouth daily.    . colchicine 0.6 MG tablet Take 1 tablet (0.6 mg total) by mouth 2 (two) times daily. 1 po bid x 2-3 days then take 1 a day 30 tablet 0  . Flaxseed, Linseed, (GROUND FLAX SEEDS PO) Take 1 to 1 1/2  Tablespoonful daily    . fluticasone (FLONASE) 50 MCG/ACT nasal spray USE 2 SPRAYS IN EACH  NOSTRIL ONCE A DAY 48 g 3  . GuaiFENesin (MUCINEX PO) Take by mouth as needed. Reported on 03/15/2016    . hydrOXYzine (ATARAX/VISTARIL) 10 MG tablet Take 1 tablet (10 mg total) by mouth 3 (three) times daily as needed. 30 tablet 0  . loratadine (CLARITIN) 10 MG tablet Take 10 mg by mouth daily.    . metoprolol tartrate (LOPRESSOR) 25 MG tablet TAKE 1 TABLET BY MOUTH TWO  TIMES DAILY AND AN  ADDITIONAL TABLET ONCE  DAILY AS NEEDED 270 tablet 1  . montelukast (SINGULAIR) 10 MG tablet Take 1 tablet (10 mg total) by mouth at bedtime. 90 tablet 3  . Multiple Minerals-Vitamins (CALCIUM CITRATE PLUS/MAGNESIUM PO) Take by mouth.    . nystatin cream (MYCOSTATIN) Apply 1 application topically 2 (two) times daily. 90 g 3  . Omega-3 Fatty Acids (FISH OIL PO) Take by mouth.    Marland Kitchen  omeprazole (PRILOSEC) 40 MG capsule TAKE 1 CAPSULE BY MOUTH  DAILY 90 capsule 0  . Polyethyl Glycol-Propyl Glycol (SYSTANE) 0.4-0.3 % GEL ophthalmic gel Place 1 application into both eyes.    . ranitidine (ZANTAC) 150 MG tablet Take 150 mg by mouth at bedtime.     . sodium chloride (OCEAN) 0.65 % SOLN nasal spray Place 1 spray into both nostrils as needed for congestion.    . travoprost, benzalkonium, (TRAVATAN) 0.004 % ophthalmic solution Place 1 drop into both eyes at bedtime.    . triamcinolone cream (KENALOG) 0.1 % Apply 1 application topically 2 (two) times daily. 90 g 3  . Vitamin Mixture (ESTER-C PO) Take 1,000 mg by mouth daily.     No current facility-administered medications on file prior to visit.    She  is allergic to cephalosporins; gabapentin; levofloxacin; psyllium; sulfonamide derivatives; aspirin; belladonna; ciprofloxacin hcl; conjugated estrogens; doxycycline; nabumetone; nsaids; soybean-containing drug products; zicam cold remedy [erysidoron #1]; zocor [simvastatin]; and prednisone..  Review of Systems Review of Systems  Constitutional: Negative for activity change, appetite change and fatigue.  HENT: Negative for hearing loss, congestion, tinnitus and ear discharge.  dentist q14m Eyes: Negative for visual disturbance (see optho q1y -- vision corrected to 20/20 with glasses).  Respiratory: Negative for cough, chest tightness and shortness of breath.   Cardiovascular: Negative for chest pain, palpitations and leg swelling.  Gastrointestinal: Negative for abdominal pain, diarrhea, constipation and abdominal distention.  Genitourinary: Negative for urgency, frequency, decreased urine volume and difficulty urinating.  Musculoskeletal: Negative for back pain, arthralgias and gait problem.  Skin: Negative for color change, pallor and rash.  Neurological: Negative for dizziness, light-headedness, numbness and headaches.  Hematological: Negative for adenopathy. Does not  bruise/bleed easily.  Psychiatric/Behavioral: Negative for suicidal ideas, confusion, sleep disturbance, self-injury, dysphoric mood, decreased concentration and agitation.       Objective:    BP 130/70 (BP Location: Right Arm, Cuff Size: Normal)   Pulse (!) 59   Temp 98.3 F (36.8 C) (Oral)   Resp 16   Ht 5\' 2"  (1.575 m)   Wt 156 lb 12.8 oz (71.1 kg)   SpO2 98%   BMI 28.68 kg/m  General appearance: alert, cooperative, appears stated age and no distress Head: Normocephalic, without obvious abnormality, atraumatic Eyes: conjunctivae/corneas clear. PERRL, EOM's intact. Fundi benign. Ears: normal TM's and external ear canals both ears Nose: Nares normal. Septum midline. Mucosa normal. No drainage or sinus tenderness. Throat: lips, mucosa, and tongue normal; teeth and gums normal Neck: no adenopathy, no carotid bruit, no JVD, supple, symmetrical, trachea midline and thyroid not enlarged, symmetric, no tenderness/mass/nodules Back: symmetric, no curvature. ROM normal. No CVA tenderness. Lungs: clear to auscultation bilaterally Breasts: normal appearance, no masses or tenderness Heart: regular rate and rhythm, S1, S2 normal, no murmur, click, rub or gallop Abdomen: soft, non-tender; bowel sounds normal; no masses,  no organomegaly Pelvic: not indicated; post-menopausal, no abnormal Pap smears in past Extremities: extremities normal, atraumatic, no cyanosis or edema Pulses: 2+ and symmetric Skin: Skin color, texture, turgor normal. No rashes or lesions Lymph nodes: Cervical, supraclavicular, and axillary nodes normal. Neurologic: Alert and oriented X 3, normal strength and tone. Normal symmetric reflexes. Normal coordination and gait    Assessment:    Healthy female exam.      Plan:    ghm utd Check labs  See After Visit Summary for Counseling Recommendations    1. Essential hypertension Well controlled, no changes to meds. Encouraged heart healthy diet such as the DASH diet  and exercise as tolerated.  - CBC with Differential/Platelet - Comprehensive metabolic panel - Lipid panel - amLODipine (NORVASC) 2.5 MG tablet; Take 1 tablet (2.5 mg total) by mouth daily.  Dispense: 90 tablet; Refill: 1  2. Hyperlipidemia, unspecified hyperlipidemia type Encouraged heart healthy diet, increase exercise, avoid trans fats, consider a krill oil cap daily  3. Other osteoporosis, unspecified pathological fracture presence prolia too expensive-- pt would like to try reclast  Unable to tolerate orals  4. Abnormal thyroid function test   - TSH

## 2018-01-03 DIAGNOSIS — H2513 Age-related nuclear cataract, bilateral: Secondary | ICD-10-CM | POA: Diagnosis not present

## 2018-01-03 DIAGNOSIS — H401132 Primary open-angle glaucoma, bilateral, moderate stage: Secondary | ICD-10-CM | POA: Diagnosis not present

## 2018-01-03 DIAGNOSIS — H04123 Dry eye syndrome of bilateral lacrimal glands: Secondary | ICD-10-CM | POA: Diagnosis not present

## 2018-01-07 ENCOUNTER — Ambulatory Visit: Payer: Medicare Other | Admitting: Internal Medicine

## 2018-01-17 ENCOUNTER — Ambulatory Visit (INDEPENDENT_AMBULATORY_CARE_PROVIDER_SITE_OTHER): Payer: Medicare Other | Admitting: Family Medicine

## 2018-01-17 ENCOUNTER — Encounter: Payer: Self-pay | Admitting: Family Medicine

## 2018-01-17 VITALS — BP 124/80 | HR 64 | Temp 98.9°F | Ht 62.0 in | Wt 156.2 lb

## 2018-01-17 DIAGNOSIS — M67449 Ganglion, unspecified hand: Secondary | ICD-10-CM | POA: Diagnosis not present

## 2018-01-17 NOTE — Progress Notes (Signed)
Musculoskeletal Exam  Patient: Kayla [name] DOB: December 30, 1945  DOS: 01/17/2018  SUBJECTIVE:  Chief Complaint:   Chief Complaint  Patient presents with  . Hand Pain    Knot on right middle finger    [name] is a 72 y.o.  female for evaluation and treatment of R middle finger pain.   Onset:  1 day ago.  No inj or change in activity Location: R middle finger Character:  aching and sharp  Progression of issue:  is unchanged Associated symptoms: knot  Treatment: to date has been none.   Neurovascular symptoms: no  ROS: Musculoskeletal/Extremities: +R middle finger pain  Past Medical History:  Diagnosis Date  . Asthma   . CAP (community acquired pneumonia) 08/2012   Dr Birdie Riddle  . Diverticulitis   . Diverticulosis   . Fasting hyperglycemia 04/2011   FBS 108  . Fibromyalgia   . Fundic gland polyps of stomach, benign   . GERD (gastroesophageal reflux disease)    gastric polyp x3  . Glaucoma     Dr Bing Plume  . IBS (irritable bowel syndrome)   . Osteopenia    last 07/2011  . Pneumonia     OP as child  . Shingles     Objective: VITAL SIGNS: BP 124/80 (BP Location: Left Arm, Patient Position: Sitting, Cuff Size: Normal)   Pulse 64   Temp 98.9 F (37.2 C) (Oral)   Ht 5\' 2"  (1.575 m)   Wt 156 lb 4 oz (70.9 kg)   SpO2 99%   BMI 28.58 kg/m  Constitutional: Well formed, well developed. No acute distress. Cardiovascular: Brisk cap refill Thorax & Lungs: No accessory muscle use Musculoskeletal: R 3rd finger.   Normal active range of motion: yes.   Normal passive range of motion: yes Tenderness to palpation: yes, over palmar prox phalanx Deformity: there is a small and circular, and solid mass over the palmar prox phalanx. Freely moveable, smooth edges, no fluctuance, erythema or excessive warmth. Ecchymosis: no Neurologic: Normal sensory function. No focal deficits noted. Psychiatric: Normal mood. Age appropriate judgment and insight. Alert & oriented x 3.     Assessment:  Ganglion cyst of finger  Plan: Watchful waiting. Offered referral to hand surgery, but she declined at this time. F/u prn. The patient voiced understanding and agreement to the plan.   [place], DO 01/17/18  11:48 AM

## 2018-01-17 NOTE — Patient Instructions (Signed)
Ice/cold pack over area for 10-15 min twice daily.  Let me know if you ever want to see a hand specialist.  If there are any visible changes to finger, I want you to be seen.  Let us know if you need anything.

## 2018-01-17 NOTE — Progress Notes (Signed)
Pre visit review using our clinic review tool, if applicable. No additional management support is needed unless otherwise documented below in the visit note. 

## 2018-01-28 ENCOUNTER — Other Ambulatory Visit: Payer: Self-pay | Admitting: *Deleted

## 2018-01-28 ENCOUNTER — Other Ambulatory Visit: Payer: Self-pay | Admitting: Internal Medicine

## 2018-01-28 ENCOUNTER — Telehealth: Payer: Self-pay | Admitting: *Deleted

## 2018-01-28 NOTE — Telephone Encounter (Signed)
Left message with husband to call back. She needs to check with ins about reclast vs. Prolia, which pays the most.  I do not see where prolia was ever precerted.

## 2018-01-29 NOTE — Telephone Encounter (Signed)
Spoke with patient about what she would like to do.  She would like to try Prolia.  Advised that we will check for cost and get back with her.  She states that several years ago that she was in a clinical trial for the Reclast and she thinks that may have caused her back pain.  She does not want anything in her system that you take once a year.   She had Prolia about 2 years ago and she did fine with that.

## 2018-01-29 NOTE — Telephone Encounter (Signed)
Prolia--benefits requested.  

## 2018-02-07 ENCOUNTER — Encounter: Payer: Self-pay | Admitting: Family Medicine

## 2018-02-07 ENCOUNTER — Ambulatory Visit (INDEPENDENT_AMBULATORY_CARE_PROVIDER_SITE_OTHER): Payer: Medicare Other | Admitting: Family Medicine

## 2018-02-07 VITALS — BP 123/53 | HR 60 | Temp 98.3°F | Resp 16 | Wt 155.6 lb

## 2018-02-07 DIAGNOSIS — J302 Other seasonal allergic rhinitis: Secondary | ICD-10-CM | POA: Diagnosis not present

## 2018-02-07 NOTE — Progress Notes (Signed)
Subjective:  I acted as a Education administrator for Kayla [name]. Kayla [name], Kayla [name]   Patient ID: Kayla [name], female    DOB: [birth year], 72 y.o.   MRN: [id number]  Chief Complaint  Patient presents with  . Ear Pain    right    HPI  Patient is in today for ear pain x few days.  No congestion  No fever. Patient Care Team: Carollee Herter, Alferd Apa, DO as PCP - General (Family Medicine) Gatha Mayer, MD as Consulting Physician (Gastroenterology) Calvert Cantor, MD as Consulting Physician (Ophthalmology) Beshears, Dorie Rank, DMD (Dentistry) Minus Breeding, MD as Consulting Physician (Cardiology) Izora Gala, MD as Consulting Physician (Otolaryngology) Lewanda Rife, Bennie Pierini., DDS as Consulting Physician (Oral Surgery) Gavin Pound, MD as Consulting Physician (Rheumatology) Philemon Kingdom, MD as Consulting Physician (Endocrinology)   Past Medical History:  Diagnosis Date  . Asthma   . CAP (community acquired pneumonia) 08/2012   Dr Birdie Riddle  . Diverticulitis   . Diverticulosis   . Fasting hyperglycemia 04/2011   FBS 108  . Fibromyalgia   . Fundic gland polyps of stomach, benign   . GERD (gastroesophageal reflux disease)    gastric polyp x3  . Glaucoma     Dr Bing Plume  . IBS (irritable bowel syndrome)   . Osteopenia    last 07/2011  . Pneumonia     OP as child  . Shingles     Past Surgical History:  Procedure Laterality Date  . COLONOSCOPY W/ POLYPECTOMY  [phone]   last  colonoscopy 2007, Dr Carlean Purl  . ESOPHAGOGASTRODUODENOSCOPY    . SPINE SURGERY     T9-L5 fusions    Family History  Problem Relation Age of Onset  . Breast cancer Mother   . Breast cancer Sister   . Breast cancer Maternal Aunt        two  . Breast cancer Paternal Aunt   . Asthma Paternal Aunt   . Heart failure Father        CHF  . Kidney disease Maternal Grandmother   . Ovarian cancer Unknown        Niece x2  . Breast cancer Unknown        niece  . Coronary artery disease Paternal Aunt        triple CABG  . Heart attack Paternal Grandmother        MI in late 59s  . Heart disease Paternal Grandfather   . Heart attack Paternal Uncle         MI in 69s  . Heart disease Maternal Aunt   . Pulmonary embolism Son   . Deep vein thrombosis Son   . Heart Problems Son   . Diabetes Neg Hx   . Stroke Neg Hx   . COPD Neg Hx   . Colon polyps Neg Hx     Social History   Socioeconomic History  . Marital status: Married    Spouse name: Not on file  . Number of children: Not on file  . Years of education: Not on file  . Highest education level: Not on file  Occupational History  . Not on file  Social Needs  . Financial resource strain: Not on file  . Food insecurity:    Worry: Not on file    Inability: Not on file  . Transportation needs:    Medical: Not on file    Non-medical: Not on file  Tobacco Use  . Smoking status: Former Smoker  Packs/day: 0.50    Years: 10.00    Pack years: 5.00    Types: Cigarettes    Last attempt to quit: 09/10/1974    Years since quitting: 43.4  . Smokeless tobacco: Never Used  . Tobacco comment: smoked 1966- 1976, up to 1 ppd  Substance and Sexual Activity  . Alcohol use: No    Alcohol/week: 0.0 oz  . Drug use: No  . Sexual activity: Yes  Lifestyle  . Physical activity:    Days per week: Not on file    Minutes per session: Not on file  . Stress: Not on file  Relationships  . Social connections:    Talks on phone: Not on file    Gets together: Not on file    Attends religious service: Not on file    Active member of club or organization: Not on file    Attends meetings of clubs or organizations: Not on file    Relationship status: Not on file  . Intimate partner violence:    Fear of current or ex partner: Not on file    Emotionally abused: Not on file    Physically abused: Not on file    Forced sexual activity: Not on file  Other Topics Concern  . Not on file  Social History Narrative   Daily caffeine 3 cups   Regular  exercise   Married          Outpatient Medications Prior to Visit  Medication Sig Dispense Refill  . acetaminophen (TYLENOL) 500 MG tablet Take 500 mg by mouth as needed.    Marland Kitchen albuterol (PROVENTIL HFA;VENTOLIN HFA) 108 (90 Base) MCG/ACT inhaler Inhale 2 puffs into the lungs every 6 (six) hours as needed for wheezing or shortness of breath. As needed only 1 Inhaler 3  . amLODipine (NORVASC) 2.5 MG tablet Take 1 tablet (2.5 mg total) by mouth daily. 90 tablet 1  . azelastine (ASTELIN) 0.1 % nasal spray Place 2 sprays into both nostrils 2 (two) times daily. Use in each nostril as directed 30 mL 12  . bifidobacterium infantis (ALIGN) capsule Take 1 capsule by mouth daily. 21 capsule 0  . BLACK ELDERBERRY,BERRY-FLOWER, PO Take by mouth. Reported on 03/15/2016    . carboxymethylcellulose (REFRESH PLUS) 0.5 % SOLN 1 drop 3 (three) times daily as needed.    . cholecalciferol (VITAMIN D) 1000 units tablet Take 1 capsule by mouth daily.    . colchicine 0.6 MG tablet Take 1 tablet (0.6 mg total) by mouth 2 (two) times daily. 1 po bid x 2-3 days then take 1 a day 30 tablet 0  . Flaxseed, Linseed, (GROUND FLAX SEEDS PO) Take 1 to 1 1/2  Tablespoonful daily    . fluticasone (FLONASE) 50 MCG/ACT nasal spray USE 2 SPRAYS IN EACH  NOSTRIL ONCE A DAY 48 g 3  . GuaiFENesin (MUCINEX PO) Take by mouth as needed. Reported on 03/15/2016    . hydrOXYzine (ATARAX/VISTARIL) 10 MG tablet Take 1 tablet (10 mg total) by mouth 3 (three) times daily as needed. 30 tablet 0  . loratadine (CLARITIN) 10 MG tablet Take 10 mg by mouth daily.    . metoprolol tartrate (LOPRESSOR) 25 MG tablet TAKE 1 TABLET BY MOUTH TWO  TIMES DAILY AND AN  ADDITIONAL TABLET ONCE  DAILY AS NEEDED 270 tablet 1  . montelukast (SINGULAIR) 10 MG tablet Take 1 tablet (10 mg total) by mouth at bedtime. 90 tablet 3  . Multiple Minerals-Vitamins (CALCIUM CITRATE PLUS/MAGNESIUM PO)  Take by mouth.    . nystatin cream (MYCOSTATIN) Apply 1 application topically 2  (two) times daily. 90 g 3  . Omega-3 Fatty Acids (FISH OIL PO) Take by mouth.    Marland Kitchen omeprazole (PRILOSEC) 40 MG capsule TAKE 1 CAPSULE BY MOUTH  DAILY 90 capsule 3  . Polyethyl Glycol-Propyl Glycol (SYSTANE) 0.4-0.3 % GEL ophthalmic gel Place 1 application into both eyes.    . ranitidine (ZANTAC) 150 MG tablet Take 150 mg by mouth at bedtime.     . sodium chloride (OCEAN) 0.65 % SOLN nasal spray Place 1 spray into both nostrils as needed for congestion.    . travoprost, benzalkonium, (TRAVATAN) 0.004 % ophthalmic solution Place 1 drop into both eyes at bedtime.    . triamcinolone cream (KENALOG) 0.1 % Apply 1 application topically 2 (two) times daily. 90 g 3  . Vitamin Mixture (ESTER-C PO) Take 1,000 mg by mouth daily.     No facility-administered medications prior to visit.     Allergies  Allergen Reactions  . Cephalosporins     Blisters  orally  . Gabapentin     REACTION: rash  . Levofloxacin     REACTION: stomach ache and rash  . Psyllium     REACTION: rash  . Sulfonamide Derivatives Anaphylaxis    REACTION: shock, urticaria  . Aspirin     REACTION: stomach pain  . Belladonna   . Ciprofloxacin Hcl   . Conjugated Estrogens   . Doxycycline     Abdominal pain  . Nabumetone   . Nsaids   . Soybean-Containing Drug Products Other (See Comments)    GI upset  . Zicam Cold Remedy [Erysidoron #1]   . Zocor [Simvastatin] Itching  . Prednisone Rash    NO PROBLEM WITH MEDROL DOSE PAK Oral prednisone caused facial burning, "made my face feel like it was on fire"    Review of Systems  Constitutional: Negative for chills, fever and malaise/fatigue.  HENT: Positive for ear pain. Negative for congestion and hearing loss.   Eyes: Negative for discharge.  Respiratory: Negative for cough, sputum production and shortness of breath.   Cardiovascular: Negative for chest pain, palpitations and leg swelling.  Gastrointestinal: Negative for abdominal pain, blood in stool, constipation,  diarrhea, heartburn, nausea and vomiting.  Genitourinary: Negative for dysuria, frequency, hematuria and urgency.  Musculoskeletal: Negative for back pain, falls and myalgias.  Skin: Negative for rash.  Neurological: Negative for dizziness, sensory change, loss of consciousness, weakness and headaches.  Endo/Heme/Allergies: Negative for environmental allergies. Does not bruise/bleed easily.  Psychiatric/Behavioral: Negative for depression and suicidal ideas. The patient is not nervous/anxious and does not have insomnia.        Objective:    Physical Exam  Constitutional: She is oriented to person, place, and time. She appears well-developed and well-nourished.  HENT:  Head: Normocephalic and atraumatic.  Right Ear: Tympanic membrane is bulging. A middle ear effusion is present.  Left Ear: Hearing, tympanic membrane, external ear and ear canal normal.  Eyes: Conjunctivae and EOM are normal.  Neck: Normal range of motion. Neck supple. No JVD present. Carotid bruit is not present. No thyromegaly present.  Cardiovascular: Normal rate, regular rhythm and normal heart sounds.  No murmur heard. Pulmonary/Chest: Effort normal and breath sounds normal. No respiratory distress. She has no wheezes. She has no rales. She exhibits no tenderness.  Musculoskeletal: She exhibits no edema.  Neurological: She is alert and oriented to person, place, and time.  Psychiatric: She has  a normal mood and affect.  Nursing note and vitals reviewed.   BP (!) 123/53 (BP Location: Left Arm, Patient Position: Sitting, Cuff Size: Normal)   Pulse 60   Temp 98.3 F (36.8 C) (Oral)   Resp 16   Wt 155 lb 9.6 oz (70.6 kg)   SpO2 100%   BMI 28.46 kg/m  Wt Readings from Last 3 Encounters:  02/07/18 155 lb 9.6 oz (70.6 kg)  01/17/18 156 lb 4 oz (70.9 kg)  01/02/18 156 lb 12.8 oz (71.1 kg)   BP Readings from Last 3 Encounters:  02/07/18 (!) 123/53  01/17/18 124/80  01/02/18 130/70     Immunization History    Administered Date(s) Administered  . Influenza Whole 06/14/2008, 06/09/2010, 07/06/2011  . Influenza, High Dose Seasonal PF 06/10/2014, 05/26/2015, 05/24/2016, 07/02/2017  . Influenza, Seasonal, Injecte, Preservative Fre 05/26/2013  . Influenza-Unspecified 05/30/2012, 05/22/2013  . Pneumococcal Conjugate-13 01/11/2014  . Pneumococcal Polysaccharide-23 07/12/2011  . Td 03/07/2006  . Tdap 02/17/2015  . Zoster 08/19/2013  . Zoster Recombinat (Shingrix) 12/08/2017    Health Maintenance  Topic Date Due  . INFLUENZA VACCINE  04/10/2018  . MAMMOGRAM  11/22/2018  . TETANUS/TDAP  02/16/2025  . COLONOSCOPY  03/15/2026  . DEXA SCAN  Completed  . Hepatitis C Screening  Completed  . PNA vac Low Risk Adult  Completed    Lab Results  Component Value Date   WBC 4.8 01/02/2018   HGB 14.0 01/02/2018   HCT 41.4 01/02/2018   PLT 168.0 01/02/2018   GLUCOSE 88 01/02/2018   CHOL 220 (H) 01/02/2018   TRIG 90.0 01/02/2018   HDL 73.90 01/02/2018   LDLDIRECT 153.3 12/18/2012   LDLCALC 128 (H) 01/02/2018   ALT 16 01/02/2018   AST 21 01/02/2018   NA 138 01/02/2018   K 5.3 (H) 01/02/2018   CL 102 01/02/2018   CREATININE 0.73 01/02/2018   BUN 13 01/02/2018   CO2 29 01/02/2018   TSH 3.24 01/02/2018   INR 0.97 12/15/2009   HGBA1C 5.4 11/01/2011    Lab Results  Component Value Date   TSH 3.24 01/02/2018   Lab Results  Component Value Date   WBC 4.8 01/02/2018   HGB 14.0 01/02/2018   HCT 41.4 01/02/2018   MCV 89.0 01/02/2018   PLT 168.0 01/02/2018   Lab Results  Component Value Date   NA 138 01/02/2018   K 5.3 (H) 01/02/2018   CO2 29 01/02/2018   GLUCOSE 88 01/02/2018   BUN 13 01/02/2018   CREATININE 0.73 01/02/2018   BILITOT 0.6 01/02/2018   ALKPHOS 84 01/02/2018   AST 21 01/02/2018   ALT 16 01/02/2018   PROT 7.1 01/02/2018   ALBUMIN 4.5 01/02/2018   CALCIUM 10.1 01/02/2018   GFR 83.22 01/02/2018   Lab Results  Component Value Date   CHOL 220 (H) 01/02/2018   Lab  Results  Component Value Date   HDL 73.90 01/02/2018   Lab Results  Component Value Date   LDLCALC 128 (H) 01/02/2018   Lab Results  Component Value Date   TRIG 90.0 01/02/2018   Lab Results  Component Value Date   CHOLHDL 3 01/02/2018   Lab Results  Component Value Date   HGBA1C 5.4 11/01/2011         Assessment & Plan:   Problem List Items Addressed This Visit      Unprioritized   Seasonal allergies - Primary    flonase and antihistamine  rto if symptoms worsen or do  not improve         I am having Chester Lonsway maintain her GuaiFENesin (MUCINEX PO), ranitidine, acetaminophen, bifidobacterium infantis, travoprost (benzalkonium), Omega-3 Fatty Acids (FISH OIL PO), Multiple Minerals-Vitamins (CALCIUM CITRATE PLUS/MAGNESIUM PO), nystatin cream, (BLACK ELDERBERRY,BERRY-FLOWER, PO), sodium chloride, carboxymethylcellulose, triamcinolone cream, colchicine, Vitamin Mixture (ESTER-C PO), hydrOXYzine, albuterol, cholecalciferol, (Flaxseed, Linseed, (GROUND FLAX SEEDS PO)), loratadine, metoprolol tartrate, fluticasone, Polyethyl Glycol-Propyl Glycol, montelukast, azelastine, amLODipine, and omeprazole.  No orders of the defined types were placed in this encounter.   CMA served as Education administrator during this visit. History, Physical and Plan performed by medical provider. Documentation and orders reviewed and attested to.  [name], DO

## 2018-02-07 NOTE — Patient Instructions (Signed)

## 2018-02-08 DIAGNOSIS — J302 Other seasonal allergic rhinitis: Secondary | ICD-10-CM | POA: Insufficient documentation

## 2018-02-08 NOTE — Assessment & Plan Note (Signed)
flonase and antihistamine  rto if symptoms worsen or do not improve

## 2018-02-10 NOTE — Telephone Encounter (Signed)
[employer] benefits received °PA not required °20% [employer] °$30 Admin fee ° ° °Patient may owe approximately $250 OOP ° ° °Letter mailed to inform patient of benefits and to schedule ° °

## 2018-03-05 ENCOUNTER — Encounter: Payer: Self-pay | Admitting: Internal Medicine

## 2018-03-05 ENCOUNTER — Ambulatory Visit (INDEPENDENT_AMBULATORY_CARE_PROVIDER_SITE_OTHER): Payer: Medicare Other | Admitting: Internal Medicine

## 2018-03-05 ENCOUNTER — Ambulatory Visit (HOSPITAL_BASED_OUTPATIENT_CLINIC_OR_DEPARTMENT_OTHER)
Admission: RE | Admit: 2018-03-05 | Discharge: 2018-03-05 | Disposition: A | Discharge status: Home / Self Care | Payer: Medicare Other | Source: Ambulatory Visit | Attending: Internal Medicine | Admitting: Internal Medicine

## 2018-03-05 VITALS — BP 128/68 | HR 65 | Temp 98.2°F | Resp 16 | Ht 62.0 in | Wt 154.5 lb

## 2018-03-05 DIAGNOSIS — R079 Chest pain, unspecified: Secondary | ICD-10-CM

## 2018-03-05 DIAGNOSIS — Z96698 Presence of other orthopedic joint implants: Secondary | ICD-10-CM | POA: Diagnosis not present

## 2018-03-05 DIAGNOSIS — K219 Gastro-esophageal reflux disease without esophagitis: Secondary | ICD-10-CM | POA: Diagnosis not present

## 2018-03-05 DIAGNOSIS — R0602 Shortness of breath: Secondary | ICD-10-CM | POA: Diagnosis not present

## 2018-03-05 DIAGNOSIS — J45909 Unspecified asthma, uncomplicated: Secondary | ICD-10-CM | POA: Diagnosis not present

## 2018-03-05 DIAGNOSIS — J40 Bronchitis, not specified as acute or chronic: Secondary | ICD-10-CM

## 2018-03-05 DIAGNOSIS — J4541 Moderate persistent asthma with (acute) exacerbation: Secondary | ICD-10-CM

## 2018-03-05 DIAGNOSIS — M4185 Other forms of scoliosis, thoracolumbar region: Secondary | ICD-10-CM | POA: Insufficient documentation

## 2018-03-05 MED ORDER — METHYLPREDNISOLONE 4 MG PO TABS: ORAL_TABLET | ORAL | 0 refills | Status: DC

## 2018-03-05 NOTE — Progress Notes (Signed)
Pre visit review using our clinic review tool, if applicable. No additional management support is needed unless otherwise documented below in the visit note. 

## 2018-03-05 NOTE — Patient Instructions (Signed)
Get your chest x-ray  For asthma:  Medrol Pak as before  Mucinex DM twice a day until better  Albuterol up to 4 times a day as needed  Omeprazole: Increase to 1 tablet before breakfast and 1 before dinner for 2 weeks.  Call if not gradually better  Call if severe symptoms, more short of breath, low oxygen, increased chest pain.

## 2018-03-05 NOTE — Progress Notes (Signed)
Subjective:    Patient ID: Kayla [name], female    DOB: [birth year], 72 y.o.   MRN: [id number]  DOS:  03/05/2018 Type of visit - description : Acute, multiple concerns Interval history: 2 days ago, she woke up and she was feeling very achy and tired. Nevertheless went to the gym and walked on the treadmill. While exercising she felt well without chest pain or difficulty breathing. After she went home she felt extremely fatigued. She also developed chest congestion, "tightness, like I need to cough".  "No chest pain".  Also some rattling.  She has a O2 sat at home and it was normal, did a peak flow and it was 400 which is good for her. Some cough w/ minimal sputum today, no hemoptysis  She feels that she had either bronchitis or an asthma exacerbation and took [employer], did not feel much improved. Symptoms continue the next day and today for the first time she is feeling somewhat better but not completely back to baseline.   Review of Systems Denies fever chills She admits that she has been having more heartburn and burping for the last 2 to 3 days. No nausea, vomiting, diarrhea.  No abdominal pain or change in the color of the stools. No palpitations, she has DOE , currently  slightly above baseline.  No recent airplane trips or prolonged car trips.  No lower extremity edema.  Past Medical History:  Diagnosis Date  . Asthma   . CAP (community acquired pneumonia) 08/2012   Dr Birdie Riddle  . Diverticulitis   . Diverticulosis   . Fasting hyperglycemia 04/2011   FBS 108  . Fibromyalgia   . Fundic gland polyps of stomach, benign   . GERD (gastroesophageal reflux disease)    gastric polyp x3  . Glaucoma     Dr Bing Plume  . IBS (irritable bowel syndrome)   . Osteopenia    last 07/2011  . Pneumonia     OP as child  . Shingles     Past Surgical History:  Procedure Laterality Date  . COLONOSCOPY W/ POLYPECTOMY  [phone]   last  colonoscopy 2007, Dr Carlean Purl  .  ESOPHAGOGASTRODUODENOSCOPY    . SPINE SURGERY     T9-L5 fusions    Social History   Socioeconomic History  . Marital status: Married    Spouse name: Not on file  . Number of children: Not on file  . Years of education: Not on file  . Highest education level: Not on file  Occupational History  . Not on file  Social Needs  . Financial resource strain: Not on file  . Food insecurity:    Worry: Not on file    Inability: Not on file  . Transportation needs:    Medical: Not on file    Non-medical: Not on file  Tobacco Use  . Smoking status: Former Smoker    Packs/day: 0.50    Years: 10.00    Pack years: 5.00    Types: Cigarettes    Last attempt to quit: 09/10/1974    Years since quitting: 43.5  . Smokeless tobacco: Never Used  . Tobacco comment: smoked 1966- 1976, up to 1 ppd  Substance and Sexual Activity  . Alcohol use: No    Alcohol/week: 0.0 oz  . Drug use: No  . Sexual activity: Yes  Lifestyle  . Physical activity:    Days per week: Not on file    Minutes per session: Not on file  .  Stress: Not on file  Relationships  . Social connections:    Talks on phone: Not on file    Gets together: Not on file    Attends religious service: Not on file    Active member of club or organization: Not on file    Attends meetings of clubs or organizations: Not on file    Relationship status: Not on file  . Intimate partner violence:    Fear of current or ex partner: Not on file    Emotionally abused: Not on file    Physically abused: Not on file    Forced sexual activity: Not on file  Other Topics Concern  . Not on file  Social History Narrative   Daily caffeine 3 cups   Regular exercise   Married            Allergies as of 03/05/2018      Reactions   Cephalosporins    Blisters  orally   Gabapentin    REACTION: rash   Levofloxacin    REACTION: stomach ache and rash   Psyllium    REACTION: rash   Sulfonamide Derivatives Anaphylaxis   REACTION: shock, urticaria     Aspirin    REACTION: stomach pain   Belladonna    Ciprofloxacin Hcl    Conjugated Estrogens    Doxycycline    Abdominal pain   Nabumetone    Nsaids    Soybean-containing Drug Products Other (See Comments)   GI upset   Zicam Cold Remedy [erysidoron #1]    Zocor [simvastatin] Itching   Prednisone Rash   NO PROBLEM WITH MEDROL DOSE PAK Oral prednisone caused facial burning, "made my face feel like it was on fire"      Medication List        Accurate as of 03/05/18 11:59 PM. Always use your most recent med list.          acetaminophen 500 MG tablet Commonly known as:  TYLENOL Take 500 mg by mouth as needed.   albuterol 108 (90 Base) MCG/ACT inhaler Commonly known as:  PROVENTIL HFA;VENTOLIN HFA Inhale 2 puffs into the lungs every 6 (six) hours as needed for wheezing or shortness of breath. As needed only   amLODipine 2.5 MG tablet Commonly known as:  NORVASC Take 1 tablet (2.5 mg total) by mouth daily.   azelastine 0.1 % nasal spray Commonly known as:  ASTELIN Place 2 sprays into both nostrils 2 (two) times daily. Use in each nostril as directed   bifidobacterium infantis capsule Take 1 capsule by mouth daily.   BLACK ELDERBERRY(BERRY-FLOWER) PO Take by mouth. Reported on 03/15/2016   CALCIUM CITRATE PLUS/MAGNESIUM PO Take by mouth.   carboxymethylcellulose 0.5 % Soln Commonly known as:  REFRESH PLUS 1 drop 3 (three) times daily as needed.   cetirizine 10 MG tablet Commonly known as:  ZYRTEC Take 10 mg by mouth daily.   cholecalciferol 1000 units tablet Commonly known as:  VITAMIN D Take 1 capsule by mouth daily.   colchicine 0.6 MG tablet Take 1 tablet (0.6 mg total) by mouth 2 (two) times daily. 1 po bid x 2-3 days then take 1 a day   ESTER-C PO Take 1,000 mg by mouth daily.   FISH OIL PO Take by mouth.   fluticasone 50 MCG/ACT nasal spray Commonly known as:  FLONASE USE 2 SPRAYS IN EACH  NOSTRIL ONCE A DAY   GROUND FLAX SEEDS PO Take 1 to 1  1/2  Tablespoonful  daily   hydrOXYzine 10 MG tablet Commonly known as:  ATARAX/VISTARIL Take 1 tablet (10 mg total) by mouth 3 (three) times daily as needed.   latanoprost 0.005 % ophthalmic solution Commonly known as:  XALATAN Place 1 drop into both eyes at bedtime.   methylPREDNISolone 4 MG tablet Commonly known as:  MEDROL 5 tab po qd X 1d then 4 tab po qd X 1d then 3 tab po qd X 1d then 2 tab po qd then 1 tab po qd   metoprolol tartrate 25 MG tablet Commonly known as:  LOPRESSOR TAKE 1 TABLET BY MOUTH TWO  TIMES DAILY AND AN  ADDITIONAL TABLET ONCE  DAILY AS NEEDED   montelukast 10 MG tablet Commonly known as:  SINGULAIR Take 1 tablet (10 mg total) by mouth at bedtime.   MUCINEX PO Take by mouth as needed. Reported on 03/15/2016   nystatin cream Commonly known as:  MYCOSTATIN Apply 1 application topically 2 (two) times daily.   omeprazole 40 MG capsule Commonly known as:  PRILOSEC TAKE 1 CAPSULE BY MOUTH  DAILY   ranitidine 150 MG tablet Commonly known as:  ZANTAC Take 150 mg by mouth at bedtime.   sodium chloride 0.65 % Soln nasal spray Commonly known as:  OCEAN Place 1 spray into both nostrils as needed for congestion.   SYSTANE 0.4-0.3 % Gel ophthalmic gel Generic drug:  Polyethyl Glycol-Propyl Glycol Place 1 application into both eyes.   travoprost (benzalkonium) 0.004 % ophthalmic solution Commonly known as:  TRAVATAN Place 1 drop into both eyes at bedtime.   triamcinolone cream 0.1 % Commonly known as:  KENALOG Apply 1 application topically 2 (two) times daily.          Objective:   Physical Exam BP 128/68 (BP Location: Left Arm, Patient Position: Sitting, Cuff Size: Small)   Pulse 65   Temp 98.2 F (36.8 C) (Oral)   Resp 16   Ht 5\' 2"  (1.575 m)   Wt 154 lb 8 oz (70.1 kg)   SpO2 98%   BMI 28.26 kg/m  General:   Well developed, NAD, see BMI.  HEENT:  Normocephalic . Face symmetric, atraumatic.  TMs normal, throat symmetric, nose not  congested. Lungs:  CTA B very few rhonchi.  No crackles. Normal respiratory effort, no intercostal retractions, no accessory muscle use. Heart: RRR,  no murmur.  No pretibial edema bilaterally  Skin: Not pale. Not jaundice Neurologic:  alert & oriented X3.  Speech normal, gait appropriate for age and unassisted Psych--  Cognition and judgment appear intact.  Cooperative with normal attention span and concentration.  Behavior appropriate. No anxious or depressed appearing.      Assessment & Plan:   72 year old female, PMH includes asthma, DJD, HTN, fibromyalgia, glaucoma, hyperlipidemia, osteoporosis, presents with  Asthma exacerbation Patient reports cough, chest congestion and ill-defined pressure in the chest.  Because they chest discomfort I did a EKG :sinus rhythm, unchanged from previous. Overall I feel the symptoms are due to asthma.  For completeness  we will get a chest x-ray. Otherwise recommend: Medrol Dosepak, albuterol 4 times daily, increase omeprazole temporarily to twice daily, Mucinex DM. Call or ER if severe symptoms.  Strongly recommend to let me know if she does not have a good response to the Medrol pack. GERD: As above.

## 2018-03-07 DIAGNOSIS — H04123 Dry eye syndrome of bilateral lacrimal glands: Secondary | ICD-10-CM | POA: Diagnosis not present

## 2018-03-07 DIAGNOSIS — H401132 Primary open-angle glaucoma, bilateral, moderate stage: Secondary | ICD-10-CM | POA: Diagnosis not present

## 2018-03-07 DIAGNOSIS — H2513 Age-related nuclear cataract, bilateral: Secondary | ICD-10-CM | POA: Diagnosis not present

## 2018-03-24 ENCOUNTER — Ambulatory Visit (INDEPENDENT_AMBULATORY_CARE_PROVIDER_SITE_OTHER): Payer: Medicare Other | Admitting: Nurse Practitioner

## 2018-03-24 ENCOUNTER — Encounter: Payer: Self-pay | Admitting: Nurse Practitioner

## 2018-03-24 ENCOUNTER — Other Ambulatory Visit (HOSPITAL_COMMUNITY)
Admission: RE | Admit: 2018-03-24 | Discharge: 2018-03-24 | Disposition: A | Discharge status: Home / Self Care | Payer: Medicare Other | Source: Ambulatory Visit | Attending: Nurse Practitioner | Admitting: Nurse Practitioner

## 2018-03-24 VITALS — BP 132/72 | HR 62 | Temp 98.6°F | Ht 62.0 in | Wt 156.0 lb

## 2018-03-24 DIAGNOSIS — R102 Pelvic and perineal pain: Secondary | ICD-10-CM | POA: Insufficient documentation

## 2018-03-24 DIAGNOSIS — R8290 Unspecified abnormal findings in urine: Secondary | ICD-10-CM | POA: Diagnosis not present

## 2018-03-24 DIAGNOSIS — N952 Postmenopausal atrophic vaginitis: Secondary | ICD-10-CM | POA: Insufficient documentation

## 2018-03-24 LAB — POCT URINALYSIS DIPSTICK
BILIRUBIN UA: NEGATIVE
Glucose, UA: NEGATIVE
Ketones, UA: NEGATIVE
Nitrite, UA: NEGATIVE
Protein, UA: NEGATIVE
Spec Grav, UA: <=1.005 — AB (ref 1.010–1.025)
Urobilinogen, UA: 0.2 E.U./dL
pH, UA: 8 (ref 5.0–8.0)

## 2018-03-24 MED ORDER — PHENAZOPYRIDINE HCL 100 MG PO TABS: 100.0000 mg | ORAL_TABLET | Freq: Two times a day (BID) | ORAL | 0 refills | Status: DC | PRN

## 2018-03-24 NOTE — Patient Instructions (Addendum)
Urine sent for culture.  Push oral hydration.  You will be called with results in 2-3days.  Continue use of vaginal moisturizer as needed.

## 2018-03-24 NOTE — Progress Notes (Signed)
Subjective:  Patient ID: Kayla [name], female    DOB: [birth year]  Age: 72 y.o. MRN: [id number]  CC: Dysuria (discomfort in abd/bladder area/going on 1 wk. )  Abdominal Pain  This is a new problem. The current episode started in the past 7 days. The onset quality is gradual. The problem occurs constantly. The problem has been unchanged. The pain is located in the suprapubic region. The quality of the pain is a sensation of fullness. The abdominal pain radiates to the pelvis. Associated symptoms include dysuria. Pertinent negatives include no anorexia, constipation, diarrhea, fever, flatus, frequency, hematochezia, hematuria, melena, myalgias, nausea or vomiting. Exacerbated by: intercourse with husband. The pain is relieved by nothing. Treatments tried: increased oral hydration, tylenol, Replens mosturizer. The treatment provided mild relief.   Suprapubic tenderness x 4days. Describes as pressure Onset after attempt at intercourse with husband. Vaginal dryness, tried replens moistuizing. Minimal relief.  Reviewed [place] today.  Outpatient Medications Prior to Visit  Medication Sig Dispense Refill  . acetaminophen (TYLENOL) 500 MG tablet Take 500 mg by mouth as needed.    Marland Kitchen albuterol (PROVENTIL HFA;VENTOLIN HFA) 108 (90 Base) MCG/ACT inhaler Inhale 2 puffs into the lungs every 6 (six) hours as needed for wheezing or shortness of breath. As needed only 1 Inhaler 3  . amLODipine (NORVASC) 2.5 MG tablet Take 1 tablet (2.5 mg total) by mouth daily. 90 tablet 1  . bifidobacterium infantis (ALIGN) capsule Take 1 capsule by mouth daily. 21 capsule 0  . BLACK ELDERBERRY,BERRY-FLOWER, PO Take by mouth. Reported on 03/15/2016    . carboxymethylcellulose (REFRESH PLUS) 0.5 % SOLN 1 drop 3 (three) times daily as needed.    . cetirizine (ZYRTEC) 10 MG tablet Take 10 mg by mouth daily.    . cholecalciferol (VITAMIN D) 1000 units tablet Take 1 capsule by mouth daily.    . colchicine 0.6 MG tablet Take 1  tablet (0.6 mg total) by mouth 2 (two) times daily. 1 po bid x 2-3 days then take 1 a day 30 tablet 0  . Flaxseed, Linseed, (GROUND FLAX SEEDS PO) Take 1 to 1 1/2  Tablespoonful daily    . fluticasone (FLONASE) 50 MCG/ACT nasal spray USE 2 SPRAYS IN EACH  NOSTRIL ONCE A DAY 48 g 3  . GuaiFENesin (MUCINEX PO) Take by mouth as needed. Reported on 03/15/2016    . hydrOXYzine (ATARAX/VISTARIL) 10 MG tablet Take 1 tablet (10 mg total) by mouth 3 (three) times daily as needed. 30 tablet 0  . latanoprost (XALATAN) 0.005 % ophthalmic solution Place 1 drop into both eyes at bedtime.    . metoprolol tartrate (LOPRESSOR) 25 MG tablet TAKE 1 TABLET BY MOUTH TWO  TIMES DAILY AND AN  ADDITIONAL TABLET ONCE  DAILY AS NEEDED 270 tablet 1  . montelukast (SINGULAIR) 10 MG tablet Take 1 tablet (10 mg total) by mouth at bedtime. 90 tablet 3  . Multiple Minerals-Vitamins (CALCIUM CITRATE PLUS/MAGNESIUM PO) Take by mouth.    . nystatin cream (MYCOSTATIN) Apply 1 application topically 2 (two) times daily. 90 g 3  . Omega-3 Fatty Acids (FISH OIL PO) Take by mouth.    Marland Kitchen omeprazole (PRILOSEC) 40 MG capsule TAKE 1 CAPSULE BY MOUTH  DAILY 90 capsule 3  . Polyethyl Glycol-Propyl Glycol (SYSTANE) 0.4-0.3 % GEL ophthalmic gel Place 1 application into both eyes.    . ranitidine (ZANTAC) 150 MG tablet Take 150 mg by mouth at bedtime.     . sodium chloride (OCEAN) 0.65 % SOLN  nasal spray Place 1 spray into both nostrils as needed for congestion.    . triamcinolone cream (KENALOG) 0.1 % Apply 1 application topically 2 (two) times daily. 90 g 3  . Vitamin Mixture (ESTER-C PO) Take 1,000 mg by mouth daily.    Marland Kitchen azelastine (ASTELIN) 0.1 % nasal spray Place 2 sprays into both nostrils 2 (two) times daily. Use in each nostril as directed (Patient not taking: Reported on 03/05/2018) 30 mL 12  . methylPREDNISolone (MEDROL) 4 MG tablet 5 tab po qd X 1d then 4 tab po qd X 1d then 3 tab po qd X 1d then 2 tab po qd then 1 tab po qd (Patient not  taking: Reported on 03/24/2018) 15 tablet 0  . travoprost, benzalkonium, (TRAVATAN) 0.004 % ophthalmic solution Place 1 drop into both eyes at bedtime.     No facility-administered medications prior to visit.     ROS See HPI  Objective:  BP 132/72   Pulse 62   Temp 98.6 F (37 C) (Oral)   Ht 5\' 2"  (1.575 m)   Wt 156 lb (70.8 kg)   SpO2 98%   BMI 28.53 kg/m   BP Readings from Last 3 Encounters:  03/24/18 132/72  03/05/18 128/68  02/07/18 (!) 123/53    Wt Readings from Last 3 Encounters:  03/24/18 156 lb (70.8 kg)  03/05/18 154 lb 8 oz (70.1 kg)  02/07/18 155 lb 9.6 oz (70.6 kg)    Physical Exam  Constitutional: No distress.  Abdominal: Soft. Bowel sounds are normal. There is no guarding.  Genitourinary: Rectum normal. Pelvic exam was performed with patient supine. There is no tenderness on the right labia. There is no tenderness on the left labia. No erythema, tenderness or bleeding in the vagina. No vaginal discharge found.  Genitourinary Comments: Atrophy of vaginal walls, no erythema, no discharge  Skin: Skin is warm and dry. No erythema.  Psychiatric: She has a normal mood and affect. Her behavior is normal.  Vitals reviewed.   Lab Results  Component Value Date   WBC 4.8 01/02/2018   HGB 14.0 01/02/2018   HCT 41.4 01/02/2018   PLT 168.0 01/02/2018   GLUCOSE 88 01/02/2018   CHOL 220 (H) 01/02/2018   TRIG 90.0 01/02/2018   HDL 73.90 01/02/2018   LDLDIRECT 153.3 12/18/2012   LDLCALC 128 (H) 01/02/2018   ALT 16 01/02/2018   AST 21 01/02/2018   NA 138 01/02/2018   K 5.3 (H) 01/02/2018   CL 102 01/02/2018   CREATININE 0.73 01/02/2018   BUN 13 01/02/2018   CO2 29 01/02/2018   TSH 3.24 01/02/2018   INR 0.97 12/15/2009   HGBA1C 5.4 11/01/2011    Dg Chest 2 View  Result Date: 03/06/2018 CLINICAL DATA:  Chest pain and pressure, shortness of breath over the last 3 days, former smoking history EXAM: CHEST - 2 VIEW COMPARISON:  Chest x-ray of 11/14/2017  FINDINGS: No active infiltrate or effusion is seen. Mediastinal and hilar contours are unremarkable. The heart is within normal limits in size. Thoracolumbar scoliosis is present with Harrington rods noted overlying the lower thoracic and upper lumbar spine. IMPRESSION: No active cardiopulmonary disease. Thoracolumbar scoliosis with Harrington rods. Electronically Signed   By: Ivar Drape M.D.   On: 03/06/2018 09:09    Assessment & Plan:   Jailen was seen today for dysuria.  Diagnoses and all orders for this visit:  Suprapubic abdominal pain -     POCT urinalysis dipstick -  Urine Culture -     phenazopyridine (PYRIDIUM) 100 MG tablet; Take 1 tablet (100 mg total) by mouth 2 (two) times daily between meals as needed for pain (with food). -     Cervicovaginal ancillary only  Abnormal urinalysis -     POCT urinalysis dipstick -     Urine Culture -     phenazopyridine (PYRIDIUM) 100 MG tablet; Take 1 tablet (100 mg total) by mouth 2 (two) times daily between meals as needed for pain (with food).  Vaginal atrophy   I am having Marla M. Hannon start on phenazopyridine. I am also having her maintain her GuaiFENesin (MUCINEX PO), ranitidine, acetaminophen, bifidobacterium infantis, travoprost (benzalkonium), Omega-3 Fatty Acids (FISH OIL PO), Multiple Minerals-Vitamins (CALCIUM CITRATE PLUS/MAGNESIUM PO), nystatin cream, (BLACK ELDERBERRY,BERRY-FLOWER, PO), sodium chloride, carboxymethylcellulose, triamcinolone cream, colchicine, Vitamin Mixture (ESTER-C PO), hydrOXYzine, albuterol, cholecalciferol, (Flaxseed, Linseed, (GROUND FLAX SEEDS PO)), metoprolol tartrate, fluticasone, Polyethyl Glycol-Propyl Glycol, montelukast, azelastine, amLODipine, omeprazole, cetirizine, latanoprost, and methylPREDNISolone.  Meds ordered this encounter  Medications  . phenazopyridine (PYRIDIUM) 100 MG tablet    Sig: Take 1 tablet (100 mg total) by mouth 2 (two) times daily between meals as needed for pain  (with food).    Dispense:  6 tablet    Refill:  0    Order Specific Question:   Supervising Provider    Answer:   MATTHEWS, CODY [[id number]]    Follow-up: Return if symptoms worsen or fail to improve.  Wilfred Lacy, NP

## 2018-03-25 LAB — CERVICOVAGINAL ANCILLARY ONLY
Bacterial vaginitis: NEGATIVE
CANDIDA VAGINITIS: NEGATIVE

## 2018-03-25 LAB — URINE CULTURE
MICRO NUMBER: 90834958
SPECIMEN QUALITY: ADEQUATE

## 2018-03-30 ENCOUNTER — Other Ambulatory Visit: Payer: Self-pay | Admitting: Family Medicine

## 2018-03-30 DIAGNOSIS — I10 Essential (primary) hypertension: Secondary | ICD-10-CM

## 2018-05-15 ENCOUNTER — Encounter: Payer: Self-pay | Admitting: Family Medicine

## 2018-05-16 NOTE — Telephone Encounter (Signed)
I have updated immunizations. Please advise on prolia

## 2018-06-07 ENCOUNTER — Encounter: Payer: Self-pay | Admitting: Family Medicine

## 2018-06-07 DIAGNOSIS — R21 Rash and other nonspecific skin eruption: Secondary | ICD-10-CM

## 2018-06-09 ENCOUNTER — Encounter: Payer: Self-pay | Admitting: Family Medicine

## 2018-06-09 MED ORDER — TRIAMCINOLONE ACETONIDE 0.1 % EX CREA: 1.0000 "application " | TOPICAL_CREAM | Freq: Two times a day (BID) | CUTANEOUS | 3 refills | Status: DC

## 2018-06-09 MED ORDER — NYSTATIN 100000 UNIT/GM EX CREA: 1.0000 "application " | TOPICAL_CREAM | Freq: Two times a day (BID) | CUTANEOUS | 3 refills | Status: DC

## 2018-06-19 ENCOUNTER — Ambulatory Visit (HOSPITAL_BASED_OUTPATIENT_CLINIC_OR_DEPARTMENT_OTHER)
Admission: RE | Admit: 2018-06-19 | Discharge: 2018-06-19 | Disposition: A | Discharge status: Home / Self Care | Payer: Medicare Other | Source: Ambulatory Visit | Attending: Medical | Admitting: Medical

## 2018-06-19 ENCOUNTER — Encounter: Payer: Self-pay | Admitting: Medical

## 2018-06-19 ENCOUNTER — Ambulatory Visit (INDEPENDENT_AMBULATORY_CARE_PROVIDER_SITE_OTHER): Payer: Medicare Other | Admitting: Medical

## 2018-06-19 VITALS — BP 139/53 | HR 56 | Temp 98.6°F | Resp 16 | Ht 62.0 in | Wt 156.0 lb

## 2018-06-19 DIAGNOSIS — M79672 Pain in left foot: Secondary | ICD-10-CM

## 2018-06-19 DIAGNOSIS — M2012 Hallux valgus (acquired), left foot: Secondary | ICD-10-CM | POA: Diagnosis not present

## 2018-06-19 LAB — CBC WITH DIFFERENTIAL/PLATELET
BASOS ABS: 0 10*3/uL (ref 0.0–0.1)
Basophils Relative: 0.5 % (ref 0.0–3.0)
EOS PCT: 1.3 % (ref 0.0–5.0)
Eosinophils Absolute: 0.1 10*3/uL (ref 0.0–0.7)
HCT: 41.6 % (ref 36.0–46.0)
HEMOGLOBIN: 13.9 g/dL (ref 12.0–15.0)
LYMPHS ABS: 0.9 10*3/uL (ref 0.7–4.0)
Lymphocytes Relative: 16.8 % (ref 12.0–46.0)
MCHC: 33.3 g/dL (ref 30.0–36.0)
MCV: 91.8 fl (ref 78.0–100.0)
MONO ABS: 0.4 10*3/uL (ref 0.1–1.0)
Monocytes Relative: 7.6 % (ref 3.0–12.0)
NEUTROS PCT: 73.8 % (ref 43.0–77.0)
Neutro Abs: 4 10*3/uL (ref 1.4–7.7)
Platelets: 165 10*3/uL (ref 150.0–400.0)
RBC: 4.53 Mil/uL (ref 3.87–5.11)
RDW: 13.5 % (ref 11.5–15.5)
WBC: 5.5 10*3/uL (ref 4.0–10.5)

## 2018-06-19 LAB — URIC ACID: URIC ACID, SERUM: 4.5 mg/dL (ref 2.4–7.0)

## 2018-06-19 MED ORDER — AMOXICILLIN-POT CLAVULANATE 875-125 MG PO TABS: 1.0000 | ORAL_TABLET | Freq: Two times a day (BID) | ORAL | 0 refills | Status: DC

## 2018-06-19 NOTE — Patient Instructions (Addendum)
Your left foot and recent ankle pain may be skin infection vs gout. I think more likely skin infection and will give augmentin. Will get xray of foot, cbc and uric acid. Will let you know those results.  If area worsens or changes let us know.  Follow up in 5 days or as needed.

## 2018-06-19 NOTE — Progress Notes (Signed)
Subjective:    Patient ID: Kayla [name], female    DOB: 18-Jan-1946, 72 y.o.   MRN: [id number]  HPI  Pt in with left mid foot pain with faint swelling on and off. This has been going on for about 2 weeks. Pt points out she can't see tendons in left foot. She points out rt foot tendons easily seen. She also notes that 2 weeks ago had random severe left ankle pain that lasted 3-4 days. She was limping for those days and then pain resolved. She only took tylenol for that ankle pain. Pt points out she has hallux valgus on left foot and some arc pain in the past. Pt has seen podiatrist for arch pain. She states told to massage arch when saw podiatrist. No arch pain presently.(saw podiatrist 2 years ago)   Review of Systems  Cardiovascular: Negative for chest pain and palpitations.  Gastrointestinal: Negative for abdominal pain.  Musculoskeletal:       See hpi.  Neurological: Negative for dizziness and headaches.  Hematological: Negative for adenopathy. Does not bruise/bleed easily.  Psychiatric/Behavioral: Negative for behavioral problems and confusion.   Past Medical History:  Diagnosis Date  . Asthma   . CAP (community acquired pneumonia) 08/2012   Dr Birdie Riddle  . Diverticulitis   . Diverticulosis   . Fasting hyperglycemia 04/2011   FBS 108  . Fibromyalgia   . Fundic gland polyps of stomach, benign   . GERD (gastroesophageal reflux disease)    gastric polyp x3  . Glaucoma     Dr Bing Plume  . IBS (irritable bowel syndrome)   . Osteopenia    last 07/2011  . Pneumonia     OP as child  . Shingles      Social History   Socioeconomic History  . Marital status: Married    Spouse name: Not on file  . Number of children: Not on file  . Years of education: Not on file  . Highest education level: Not on file  Occupational History  . Not on file  Social Needs  . Financial resource strain: Not on file  . Food insecurity:    Worry: Not on file    Inability: Not on file  .  Transportation needs:    Medical: Not on file    Non-medical: Not on file  Tobacco Use  . Smoking status: Former Smoker    Packs/day: 0.50    Years: 10.00    Pack years: 5.00    Types: Cigarettes    Last attempt to quit: 09/10/1974    Years since quitting: 43.8  . Smokeless tobacco: Never Used  . Tobacco comment: smoked 1966- 1976, up to 1 ppd  Substance and Sexual Activity  . Alcohol use: No    Alcohol/week: 0.0 standard drinks  . Drug use: No  . Sexual activity: Yes  Lifestyle  . Physical activity:    Days per week: Not on file    Minutes per session: Not on file  . Stress: Not on file  Relationships  . Social connections:    Talks on phone: Not on file    Gets together: Not on file    Attends religious service: Not on file    Active member of club or organization: Not on file    Attends meetings of clubs or organizations: Not on file    Relationship status: Not on file  . Intimate partner violence:    Fear of current or ex partner: Not on file  Emotionally abused: Not on file    Physically abused: Not on file    Forced sexual activity: Not on file  Other Topics Concern  . Not on file  Social History Narrative   Daily caffeine 3 cups   Regular exercise   Married          Past Surgical History:  Procedure Laterality Date  . COLONOSCOPY W/ POLYPECTOMY  [phone]   last  colonoscopy 2007, Dr Carlean Purl  . ESOPHAGOGASTRODUODENOSCOPY    . SPINE SURGERY     T9-L5 fusions    Family History  Problem Relation Age of Onset  . Breast cancer Mother   . Breast cancer Sister   . Breast cancer Maternal Aunt        two  . Breast cancer Paternal Aunt   . Asthma Paternal Aunt   . Heart failure Father        CHF  . Kidney disease Maternal Grandmother   . Ovarian cancer Unknown        Niece x2  . Breast cancer Unknown        niece  . Coronary artery disease Paternal Aunt        triple CABG  . Heart attack Paternal Grandmother        MI in late 23s  . Heart  disease Paternal Grandfather   . Heart attack Paternal Uncle         MI in 71s  . Heart disease Maternal Aunt   . Pulmonary embolism Son   . Deep vein thrombosis Son   . Heart Problems Son   . Diabetes Neg Hx   . Stroke Neg Hx   . COPD Neg Hx   . Colon polyps Neg Hx     Allergies  Allergen Reactions  . Cephalosporins     Blisters  orally  . Gabapentin     REACTION: rash  . Levofloxacin     REACTION: stomach ache and rash  . Psyllium     REACTION: rash  . Sulfonamide Derivatives Anaphylaxis    REACTION: shock, urticaria  . Aspirin     REACTION: stomach pain  . Belladonna   . Ciprofloxacin Hcl   . Conjugated Estrogens   . Doxycycline     Abdominal pain  . Nabumetone   . Nsaids   . Soybean-Containing Drug Products Other (See Comments)    GI upset  . Zicam Cold Remedy [Erysidoron #1]   . Zocor [Simvastatin] Itching  . Prednisone Rash    NO PROBLEM WITH MEDROL DOSE PAK Oral prednisone caused facial burning, "made my face feel like it was on fire"    Current Outpatient Medications on File Prior to Visit  Medication Sig Dispense Refill  . acetaminophen (TYLENOL) 500 MG tablet Take 500 mg by mouth as needed.    Marland Kitchen albuterol (PROVENTIL HFA;VENTOLIN HFA) 108 (90 Base) MCG/ACT inhaler Inhale 2 puffs into the lungs every 6 (six) hours as needed for wheezing or shortness of breath. As needed only 1 Inhaler 3  . amLODipine (NORVASC) 2.5 MG tablet Take 1 tablet (2.5 mg total) by mouth daily. 90 tablet 1  . bifidobacterium infantis (ALIGN) capsule Take 1 capsule by mouth daily. 21 capsule 0  . BLACK ELDERBERRY,BERRY-FLOWER, PO Take by mouth. Reported on 03/15/2016    . carboxymethylcellulose (REFRESH PLUS) 0.5 % SOLN 1 drop 3 (three) times daily as needed.    . cetirizine (ZYRTEC) 10 MG tablet Take 10 mg by mouth daily.    Marland Kitchen  cholecalciferol (VITAMIN D) 1000 units tablet Take 1 capsule by mouth daily.    . colchicine 0.6 MG tablet Take 1 tablet (0.6 mg total) by mouth 2 (two) times  daily. 1 po bid x 2-3 days then take 1 a day 30 tablet 0  . Flaxseed, Linseed, (GROUND FLAX SEEDS PO) Take 1 to 1 1/2  Tablespoonful daily    . fluticasone (FLONASE) 50 MCG/ACT nasal spray USE 2 SPRAYS IN EACH  NOSTRIL ONCE A DAY 48 g 3  . GuaiFENesin (MUCINEX PO) Take by mouth as needed. Reported on 03/15/2016    . latanoprost (XALATAN) 0.005 % ophthalmic solution Place 1 drop into both eyes at bedtime.    . metoprolol tartrate (LOPRESSOR) 25 MG tablet TAKE 1 TABLET BY MOUTH TWICE DAILY AND 1 ADDITIONAL TABLET ONCE DAILY AS NEEDED 270 tablet 0  . montelukast (SINGULAIR) 10 MG tablet Take 1 tablet (10 mg total) by mouth at bedtime. 90 tablet 3  . Multiple Minerals-Vitamins (CALCIUM CITRATE PLUS/MAGNESIUM PO) Take by mouth.    . nystatin cream (MYCOSTATIN) Apply 1 application topically 2 (two) times daily. 90 g 3  . Omega-3 Fatty Acids (FISH OIL PO) Take by mouth.    Marland Kitchen omeprazole (PRILOSEC) 40 MG capsule TAKE 1 CAPSULE BY MOUTH  DAILY 90 capsule 3  . Polyethyl Glycol-Propyl Glycol (SYSTANE) 0.4-0.3 % GEL ophthalmic gel Place 1 application into both eyes.    . sodium chloride (OCEAN) 0.65 % SOLN nasal spray Place 1 spray into both nostrils as needed for congestion.    . travoprost, benzalkonium, (TRAVATAN) 0.004 % ophthalmic solution Place 1 drop into both eyes at bedtime.    . triamcinolone cream (KENALOG) 0.1 % Apply 1 application topically 2 (two) times daily. 90 g 3  . Vitamin Mixture (ESTER-C PO) Take 1,000 mg by mouth daily.     No current facility-administered medications on file prior to visit.     BP (!) 139/53   Pulse (!) 56   Temp 98.6 F (37 C) (Oral)   Resp 16   Ht 5\' 2"  (1.575 m)   Wt 156 lb (70.8 kg)   SpO2 100%   BMI 28.53 kg/m       Objective:   Physical Exam  General- No acute distress. Pleasant patient. Neck- Full range of motion, no jvd Lungs- Clear, even and unlabored. Heart- regular rate and rhythm. Neurologic- CNII- XII grossly intact.  Left ankle- faint  swelling but no pain on palpation. Left foot- top aspect mild swelling, faint warm to touch and tender to palpation(mid portion of area faint pinkish). Hallux valgus severe on left side.     Assessment & Plan:  Your left foot and recent ankle pain may be skin infection vs gout. I think more likely skin infection and will give augmentin. Will get xray of foot, cbc and uric acid. Will let you know those results.  If area worsens or changes let us know.  Follow up in 5 days or as needed.  Mackie Pai, PA-C

## 2018-06-25 ENCOUNTER — Other Ambulatory Visit: Payer: Self-pay | Admitting: Family Medicine

## 2018-06-25 ENCOUNTER — Encounter: Payer: Self-pay | Admitting: Medical

## 2018-06-25 ENCOUNTER — Ambulatory Visit (INDEPENDENT_AMBULATORY_CARE_PROVIDER_SITE_OTHER): Payer: Medicare Other | Admitting: Medical

## 2018-06-25 VITALS — BP 141/58 | HR 56 | Temp 98.8°F | Resp 16 | Ht 62.0 in | Wt 165.6 lb

## 2018-06-25 DIAGNOSIS — L089 Local infection of the skin and subcutaneous tissue, unspecified: Secondary | ICD-10-CM

## 2018-06-25 DIAGNOSIS — I10 Essential (primary) hypertension: Secondary | ICD-10-CM

## 2018-06-25 MED ORDER — AMOXICILLIN-POT CLAVULANATE 875-125 MG PO TABS: 1.0000 | ORAL_TABLET | Freq: Two times a day (BID) | ORAL | 0 refills | Status: DC

## 2018-06-25 NOTE — Patient Instructions (Signed)
You do appear to have had foot infection based on response to antibiotic. Currently 95% better. Will give 3 more days of augmentin.  Update me in 5 days if any residual pain. If so consider referral to podiatrist since you do have some prominent hallux valgus. Hopefully this will not be necessary.

## 2018-06-25 NOTE — Progress Notes (Signed)
Subjective:    Patient ID: Kayla [name], female    DOB: 12-22-1945, 72 y.o.   MRN: [id number]  HPI  Pt in for follow up.   She states her foot is a lot less tender after using antibiotic. The foot feels less warm and she notes can see tendons now on left foot. Previously made note that she could not see tendons on last visit. She is 6 days into augmentin for possible cellulitis.  Pt uric acid test was negative  She has know hallux valgus.    Review of Systems  Constitutional: Negative for chills, fatigue and fever.  Respiratory: Negative for cough, chest tightness, shortness of breath and wheezing.   Cardiovascular: Negative for chest pain and palpitations.  Skin:       See hpi.  Hematological: Negative for adenopathy. Does not bruise/bleed easily.  Psychiatric/Behavioral: Negative for behavioral problems and confusion.    Past Medical History:  Diagnosis Date  . Asthma   . CAP (community acquired pneumonia) 08/2012   Dr Birdie Riddle  . Diverticulitis   . Diverticulosis   . Fasting hyperglycemia 04/2011   FBS 108  . Fibromyalgia   . Fundic gland polyps of stomach, benign   . GERD (gastroesophageal reflux disease)    gastric polyp x3  . Glaucoma     Dr Bing Plume  . IBS (irritable bowel syndrome)   . Osteopenia    last 07/2011  . Pneumonia     OP as child  . Shingles      Social History   Socioeconomic History  . Marital status: Married    Spouse name: Not on file  . Number of children: Not on file  . Years of education: Not on file  . Highest education level: Not on file  Occupational History  . Not on file  Social Needs  . Financial resource strain: Not on file  . Food insecurity:    Worry: Not on file    Inability: Not on file  . Transportation needs:    Medical: Not on file    Non-medical: Not on file  Tobacco Use  . Smoking status: Former Smoker    Packs/day: 0.50    Years: 10.00    Pack years: 5.00    Types: Cigarettes    Last attempt to quit:  09/10/1974    Years since quitting: 43.8  . Smokeless tobacco: Never Used  . Tobacco comment: smoked 1966- 1976, up to 1 ppd  Substance and Sexual Activity  . Alcohol use: No    Alcohol/week: 0.0 standard drinks  . Drug use: No  . Sexual activity: Yes  Lifestyle  . Physical activity:    Days per week: Not on file    Minutes per session: Not on file  . Stress: Not on file  Relationships  . Social connections:    Talks on phone: Not on file    Gets together: Not on file    Attends religious service: Not on file    Active member of club or organization: Not on file    Attends meetings of clubs or organizations: Not on file    Relationship status: Not on file  . Intimate partner violence:    Fear of current or ex partner: Not on file    Emotionally abused: Not on file    Physically abused: Not on file    Forced sexual activity: Not on file  Other Topics Concern  . Not on file  Social History Narrative  Daily caffeine 3 cups   Regular exercise   Married          Past Surgical History:  Procedure Laterality Date  . COLONOSCOPY W/ POLYPECTOMY  [phone]   last  colonoscopy 2007, Dr Carlean Purl  . ESOPHAGOGASTRODUODENOSCOPY    . SPINE SURGERY     T9-L5 fusions    Family History  Problem Relation Age of Onset  . Breast cancer Mother   . Breast cancer Sister   . Breast cancer Maternal Aunt        two  . Breast cancer Paternal Aunt   . Asthma Paternal Aunt   . Heart failure Father        CHF  . Kidney disease Maternal Grandmother   . Ovarian cancer Unknown        Niece x2  . Breast cancer Unknown        niece  . Coronary artery disease Paternal Aunt        triple CABG  . Heart attack Paternal Grandmother        MI in late 75s  . Heart disease Paternal Grandfather   . Heart attack Paternal Uncle         MI in 75s  . Heart disease Maternal Aunt   . Pulmonary embolism Son   . Deep vein thrombosis Son   . Heart Problems Son   . Diabetes Neg Hx   . Stroke Neg  Hx   . COPD Neg Hx   . Colon polyps Neg Hx     Allergies  Allergen Reactions  . Cephalosporins     Blisters  orally  . Gabapentin     REACTION: rash  . Levofloxacin     REACTION: stomach ache and rash  . Psyllium     REACTION: rash  . Sulfonamide Derivatives Anaphylaxis    REACTION: shock, urticaria  . Aspirin     REACTION: stomach pain  . Belladonna   . Ciprofloxacin Hcl   . Conjugated Estrogens   . Doxycycline     Abdominal pain  . Nabumetone   . Nsaids   . Soybean-Containing Drug Products Other (See Comments)    GI upset  . Zicam Cold Remedy [Erysidoron #1]   . Zocor [Simvastatin] Itching  . Prednisone Rash    NO PROBLEM WITH MEDROL DOSE PAK Oral prednisone caused facial burning, "made my face feel like it was on fire"    Current Outpatient Medications on File Prior to Visit  Medication Sig Dispense Refill  . acetaminophen (TYLENOL) 500 MG tablet Take 500 mg by mouth as needed.    Marland Kitchen albuterol (PROVENTIL HFA;VENTOLIN HFA) 108 (90 Base) MCG/ACT inhaler Inhale 2 puffs into the lungs every 6 (six) hours as needed for wheezing or shortness of breath. As needed only 1 Inhaler 3  . amLODipine (NORVASC) 2.5 MG tablet Take 1 tablet (2.5 mg total) by mouth daily. 90 tablet 1  . bifidobacterium infantis (ALIGN) capsule Take 1 capsule by mouth daily. 21 capsule 0  . BLACK ELDERBERRY,BERRY-FLOWER, PO Take by mouth. Reported on 03/15/2016    . carboxymethylcellulose (REFRESH PLUS) 0.5 % SOLN 1 drop 3 (three) times daily as needed.    . cetirizine (ZYRTEC) 10 MG tablet Take 10 mg by mouth daily.    . cholecalciferol (VITAMIN D) 1000 units tablet Take 1 capsule by mouth daily.    . colchicine 0.6 MG tablet Take 1 tablet (0.6 mg total) by mouth 2 (two) times daily. 1 po bid x  2-3 days then take 1 a day 30 tablet 0  . Flaxseed, Linseed, (GROUND FLAX SEEDS PO) Take 1 to 1 1/2  Tablespoonful daily    . fluticasone (FLONASE) 50 MCG/ACT nasal spray USE 2 SPRAYS IN EACH  NOSTRIL ONCE A DAY  48 g 3  . GuaiFENesin (MUCINEX PO) Take by mouth as needed. Reported on 03/15/2016    . latanoprost (XALATAN) 0.005 % ophthalmic solution Place 1 drop into both eyes at bedtime.    . metoprolol tartrate (LOPRESSOR) 25 MG tablet TAKE 1 TABLET BY MOUTH TWICE DAILY AND 1 ADDITIONAL TABLET ONCE DAILY AS NEEDED 270 tablet 0  . montelukast (SINGULAIR) 10 MG tablet Take 1 tablet (10 mg total) by mouth at bedtime. 90 tablet 3  . Multiple Minerals-Vitamins (CALCIUM CITRATE PLUS/MAGNESIUM PO) Take by mouth.    . nystatin cream (MYCOSTATIN) Apply 1 application topically 2 (two) times daily. 90 g 3  . Omega-3 Fatty Acids (FISH OIL PO) Take by mouth.    Marland Kitchen omeprazole (PRILOSEC) 40 MG capsule TAKE 1 CAPSULE BY MOUTH  DAILY 90 capsule 3  . Polyethyl Glycol-Propyl Glycol (SYSTANE) 0.4-0.3 % GEL ophthalmic gel Place 1 application into both eyes.    . sodium chloride (OCEAN) 0.65 % SOLN nasal spray Place 1 spray into both nostrils as needed for congestion.    . travoprost, benzalkonium, (TRAVATAN) 0.004 % ophthalmic solution Place 1 drop into both eyes at bedtime.    . triamcinolone cream (KENALOG) 0.1 % Apply 1 application topically 2 (two) times daily. 90 g 3  . Vitamin Mixture (ESTER-C PO) Take 1,000 mg by mouth daily.     No current facility-administered medications on file prior to visit.     BP (!) 141/58   Pulse (!) 56   Temp 98.8 F (37.1 C) (Oral)   Resp 16   Ht 5\' 2"  (1.575 m)   Wt 165 lb 9.6 oz (75.1 kg)   SpO2 100%   BMI 30.29 kg/m       Objective:   Physical Exam  .General- No acute distress. Pleasant patient.  Left ankle- No swelling and no pain. Left foot- top aspect much less swelling, no obvious warmth to touch and only one small point tender area to palpation. Hallux valgus severe on left side.      Assessment & Plan:  You do appear to have had foot infection based on response to antibiotic. Currently 95% better. Will give 3 more days of augmentin.  Update me in 5 days if  any residual pain. If so consider referral to podiatrist since you do have some prominent hallux valgus. Hopefully this will not be necessary.

## 2018-07-03 ENCOUNTER — Telehealth: Payer: Self-pay | Admitting: Internal Medicine

## 2018-07-03 NOTE — Telephone Encounter (Signed)
I told her we don't have any samples but I'm mailing her out coupons for the [place]. She said its helped her so much. She is going to be near the [place] office tomorrow and I told her to check our office their for samples/coupons as well.

## 2018-07-03 NOTE — Telephone Encounter (Signed)
Patient states she usually gets medication align from [facility] or [facility] but it is not on sale at this time. Patient wanting to know if we possibly have samples she could get for a couple weeks.

## 2018-07-18 ENCOUNTER — Ambulatory Visit: Payer: Medicare Other | Admitting: *Deleted

## 2018-07-22 ENCOUNTER — Ambulatory Visit: Payer: Medicare Other | Admitting: *Deleted

## 2018-07-25 DIAGNOSIS — H04123 Dry eye syndrome of bilateral lacrimal glands: Secondary | ICD-10-CM | POA: Diagnosis not present

## 2018-07-25 DIAGNOSIS — H2513 Age-related nuclear cataract, bilateral: Secondary | ICD-10-CM | POA: Diagnosis not present

## 2018-07-25 DIAGNOSIS — H401132 Primary open-angle glaucoma, bilateral, moderate stage: Secondary | ICD-10-CM | POA: Diagnosis not present

## 2018-08-05 ENCOUNTER — Encounter: Payer: Self-pay | Admitting: Internal Medicine

## 2018-08-05 ENCOUNTER — Ambulatory Visit (INDEPENDENT_AMBULATORY_CARE_PROVIDER_SITE_OTHER): Payer: Medicare Other | Admitting: Internal Medicine

## 2018-08-05 VITALS — BP 122/80 | HR 67 | Temp 98.4°F | Resp 16 | Ht 62.0 in | Wt 155.2 lb

## 2018-08-05 DIAGNOSIS — J40 Bronchitis, not specified as acute or chronic: Secondary | ICD-10-CM

## 2018-08-05 DIAGNOSIS — J4531 Mild persistent asthma with (acute) exacerbation: Secondary | ICD-10-CM | POA: Diagnosis not present

## 2018-08-05 MED ORDER — METHYLPREDNISOLONE 4 MG PO TABS: ORAL_TABLET | ORAL | 0 refills | Status: DC

## 2018-08-05 MED ORDER — AZITHROMYCIN 250 MG PO TABS: ORAL_TABLET | ORAL | 0 refills | Status: DC

## 2018-08-05 NOTE — Progress Notes (Signed)
Subjective:    Patient ID: Kayla [name], female    DOB: 12-17-1945, 72 y.o.   MRN: [id number]  DOS:  08/05/2018 Type of visit - description : Acute visit Symptoms started approximately 3 or 4 days ago Malaise, mild frontal headache mostly in the maxillary sinuses, cough, very small amount of clear sputum production. Some shortness of breath, she took albuterol once and felt better.   Review of Systems Denies chills, some subjective fever Occasional anterior chest discomfort after spells of cough. No nausea vomiting No wheezing per se  Past Medical History:  Diagnosis Date  . Asthma   . CAP (community acquired pneumonia) 08/2012   Dr Birdie Riddle  . Diverticulitis   . Diverticulosis   . Fasting hyperglycemia 04/2011   FBS 108  . Fibromyalgia   . Fundic gland polyps of stomach, benign   . GERD (gastroesophageal reflux disease)    gastric polyp x3  . Glaucoma     Dr Bing Plume  . IBS (irritable bowel syndrome)   . Osteopenia    last 07/2011  . Pneumonia     OP as child  . Shingles     Past Surgical History:  Procedure Laterality Date  . COLONOSCOPY W/ POLYPECTOMY  [phone]   last  colonoscopy 2007, Dr Carlean Purl  . ESOPHAGOGASTRODUODENOSCOPY    . SPINE SURGERY     T9-L5 fusions    Social History   Socioeconomic History  . Marital status: Married    Spouse name: Not on file  . Number of children: Not on file  . Years of education: Not on file  . Highest education level: Not on file  Occupational History  . Not on file  Social Needs  . Financial resource strain: Not on file  . Food insecurity:    Worry: Not on file    Inability: Not on file  . Transportation needs:    Medical: Not on file    Non-medical: Not on file  Tobacco Use  . Smoking status: Former Smoker    Packs/day: 0.50    Years: 10.00    Pack years: 5.00    Types: Cigarettes    Last attempt to quit: 09/10/1974    Years since quitting: 43.9  . Smokeless tobacco: Never Used  . Tobacco  comment: smoked 1966- 1976, up to 1 ppd  Substance and Sexual Activity  . Alcohol use: No    Alcohol/week: 0.0 standard drinks  . Drug use: No  . Sexual activity: Yes  Lifestyle  . Physical activity:    Days per week: Not on file    Minutes per session: Not on file  . Stress: Not on file  Relationships  . Social connections:    Talks on phone: Not on file    Gets together: Not on file    Attends religious service: Not on file    Active member of club or organization: Not on file    Attends meetings of clubs or organizations: Not on file    Relationship status: Not on file  . Intimate partner violence:    Fear of current or ex partner: Not on file    Emotionally abused: Not on file    Physically abused: Not on file    Forced sexual activity: Not on file  Other Topics Concern  . Not on file  Social History Narrative   Daily caffeine 3 cups   Regular exercise   Married  Allergies as of 08/05/2018      Reactions   Cephalosporins    Blisters  orally   Gabapentin    REACTION: rash   Levofloxacin    REACTION: stomach ache and rash   Psyllium    REACTION: rash   Sulfonamide Derivatives Anaphylaxis   REACTION: shock, urticaria   Aspirin    REACTION: stomach pain   Belladonna    Ciprofloxacin Hcl    Conjugated Estrogens    Doxycycline    Abdominal pain   Nabumetone    Nsaids    Soybean-containing Drug Products Other (See Comments)   GI upset   Zicam Cold Remedy [erysidoron #1]    Zocor [simvastatin] Itching   Prednisone Rash   NO PROBLEM WITH MEDROL DOSE PAK Oral prednisone caused facial burning, "made my face feel like it was on fire"      Medication List        Accurate as of 08/05/18 11:31 AM. Always use your most recent med list.          acetaminophen 500 MG tablet Commonly known as:  TYLENOL Take 500 mg by mouth as needed.   albuterol 108 (90 Base) MCG/ACT inhaler Commonly known as:  PROVENTIL HFA;VENTOLIN HFA Inhale 2 puffs into  the lungs every 6 (six) hours as needed for wheezing or shortness of breath. As needed only   amLODipine 2.5 MG tablet Commonly known as:  NORVASC TAKE 1 TABLET(2.5 MG) BY MOUTH DAILY   amoxicillin-clavulanate 875-125 MG tablet Commonly known as:  AUGMENTIN Take 1 tablet by mouth 2 (two) times daily.   bifidobacterium infantis capsule Take 1 capsule by mouth daily.   BLACK ELDERBERRY(BERRY-FLOWER) PO Take by mouth. Reported on 03/15/2016   CALCIUM CITRATE PLUS/MAGNESIUM PO Take by mouth.   carboxymethylcellulose 0.5 % Soln Commonly known as:  REFRESH PLUS 1 drop 3 (three) times daily as needed.   cetirizine 10 MG tablet Commonly known as:  ZYRTEC Take 10 mg by mouth daily.   cholecalciferol 1000 units tablet Commonly known as:  VITAMIN D Take 1 capsule by mouth daily.   colchicine 0.6 MG tablet Take 1 tablet (0.6 mg total) by mouth 2 (two) times daily. 1 po bid x 2-3 days then take 1 a day   ESTER-C PO Take 1,000 mg by mouth daily.   FISH OIL PO Take by mouth.   fluticasone 50 MCG/ACT nasal spray Commonly known as:  FLONASE USE 2 SPRAYS IN EACH  NOSTRIL ONCE A DAY   GROUND FLAX SEEDS PO Take 1 to 1 1/2  Tablespoonful daily   latanoprost 0.005 % ophthalmic solution Commonly known as:  XALATAN Place 1 drop into both eyes at bedtime.   metoprolol tartrate 25 MG tablet Commonly known as:  LOPRESSOR TAKE 1 TABLET BY MOUTH TWICE DAILY AND 1 ADDITIONAL TABLET ONCE DAILY AS NEEDED   montelukast 10 MG tablet Commonly known as:  SINGULAIR Take 1 tablet (10 mg total) by mouth at bedtime.   MUCINEX PO Take by mouth as needed. Reported on 03/15/2016   nystatin cream Commonly known as:  MYCOSTATIN Apply 1 application topically 2 (two) times daily.   omeprazole 40 MG capsule Commonly known as:  PRILOSEC TAKE 1 CAPSULE BY MOUTH  DAILY   sodium chloride 0.65 % Soln nasal spray Commonly known as:  OCEAN Place 1 spray into both nostrils as needed for congestion.     SYSTANE 0.4-0.3 % Gel ophthalmic gel Generic drug:  Polyethyl Glycol-Propyl Glycol Place 1 application  into both eyes.   travoprost (benzalkonium) 0.004 % ophthalmic solution Commonly known as:  TRAVATAN Place 1 drop into both eyes at bedtime.   triamcinolone cream 0.1 % Commonly known as:  KENALOG Apply 1 application topically 2 (two) times daily.           Objective:   Physical Exam BP 122/80 (BP Location: Left Arm, Patient Position: Sitting, Cuff Size: Small)   Pulse 67   Temp 98.4 F (36.9 C) (Oral)   Resp 16   Ht 5\' 2"  (1.575 m)   Wt 155 lb 4 oz (70.4 kg)   SpO2 91%   BMI 28.40 kg/m  General:   Well developed, NAD, BMI noted. HEENT:  Normocephalic . Face symmetric, atraumatic.  TMs normal, throat symmetric and not red, nose is slightly congested, sinuses TTP, worse on the right. Lungs:  Large airway congestion noted bilaterally, prolonged expiratory time, no actual wheezing Normal respiratory effort, no intercostal retractions, no accessory muscle use. Heart: RRR,  no murmur.  No pretibial edema bilaterally  Skin: Not pale. Not jaundice Neurologic:  alert & oriented X3.  Speech normal, gait appropriate for age and unassisted Psych--  Cognition and judgment appear intact.  Cooperative with normal attention span and concentration.  Behavior appropriate. No anxious or depressed appearing.      Assessment & Plan:    72 year old female, PMH includes asthma, DJD, HTN, fibromyalgia, glaucoma, hyperlipidemia, osteoporosis, presents with  Asthma exacerbation: Probably due to viral bronchitis, recommend supportive treatment with fluids, Mucinex, albuterol 3-4 times a day.   Take methylprednisolone for few days, also an antibiotic only if she is not gradually improving.  She agrees with the plan.

## 2018-08-05 NOTE — Progress Notes (Signed)
Pre visit review using our clinic review tool, if applicable. No additional management support is needed unless otherwise documented below in the visit note. 

## 2018-08-05 NOTE — Patient Instructions (Addendum)
Rest, fluids , tylenol  For cough:  Take Mucinex DM twice a day as needed until better  Use   albuterol 3 or 4 times a day until better   Take the  methylprednisolone as prescribed    For nasal congestion: Use OTC   Flonase : 2 nasal sprays on each side of the nose in the morning until you feel better  Avoid decongestants such as  Pseudoephedrine or phenylephrine   Take the antibiotic as prescribed only if no better in few days  Call if not gradually better over the next  10 days  Call anytime if the symptoms are severe

## 2018-08-12 ENCOUNTER — Ambulatory Visit (INDEPENDENT_AMBULATORY_CARE_PROVIDER_SITE_OTHER): Payer: Medicare Other | Admitting: Nurse Practitioner

## 2018-08-12 ENCOUNTER — Ambulatory Visit: Payer: Self-pay | Admitting: *Deleted

## 2018-08-12 ENCOUNTER — Encounter: Payer: Self-pay | Admitting: Nurse Practitioner

## 2018-08-12 ENCOUNTER — Ambulatory Visit (INDEPENDENT_AMBULATORY_CARE_PROVIDER_SITE_OTHER): Payer: Medicare Other

## 2018-08-12 VITALS — BP 152/80 | HR 55 | Temp 99.1°F | Ht 62.0 in | Wt 154.8 lb

## 2018-08-12 DIAGNOSIS — R0989 Other specified symptoms and signs involving the circulatory and respiratory systems: Secondary | ICD-10-CM | POA: Diagnosis not present

## 2018-08-12 DIAGNOSIS — J189 Pneumonia, unspecified organism: Secondary | ICD-10-CM

## 2018-08-12 DIAGNOSIS — R0789 Other chest pain: Secondary | ICD-10-CM | POA: Diagnosis not present

## 2018-08-12 DIAGNOSIS — K219 Gastro-esophageal reflux disease without esophagitis: Secondary | ICD-10-CM | POA: Diagnosis not present

## 2018-08-12 MED ORDER — AMOXICILLIN-POT CLAVULANATE 875-125 MG PO TABS: 1.0000 | ORAL_TABLET | Freq: Two times a day (BID) | ORAL | 0 refills | Status: DC

## 2018-08-12 NOTE — Progress Notes (Signed)
Subjective:  Patient ID: Kayla [name], female    DOB: Oct 30, 1945  Age: 72 y.o. MRN: [id number]  CC: Follow-up (pt is complaining of little coughing, gland swollen? discomfort on respiratory tract. pt was dx w/ bronchitis last week--was getting better but last night it seem like symptoms is coming back. )  Sore Throat   This is a new problem. The current episode started 1 to 4 weeks ago. The problem has been waxing and waning. Associated symptoms include coughing and a hoarse voice. Pertinent negatives include no congestion, diarrhea, drooling, ear discharge, ear pain, headaches, plugged ear sensation, neck pain, shortness of breath, stridor, swollen glands, trouble swallowing or vomiting. Associated symptoms comments: Fullness of throat. She has had no exposure to strep or mono.  reports fullness in chest and throat in last 2days, Flonase, saline nasal rinse, albuterol, oral prednisone and mucinex with some improvement. Also used 1dose of omeprazole  At HS last night with moderate improvement.  Peak flow reading at home this morning: 459ml/min No diaphoresis, No SOB on exertion, no PND, minimal coughing (non productive)  Reviewed past Medical, Social and Family history today.  Outpatient Medications Prior to Visit  Medication Sig Dispense Refill  . acetaminophen (TYLENOL) 500 MG tablet Take 500 mg by mouth as needed.    Marland Kitchen albuterol (PROVENTIL HFA;VENTOLIN HFA) 108 (90 Base) MCG/ACT inhaler Inhale 2 puffs into the lungs every 6 (six) hours as needed for wheezing or shortness of breath. As needed only 1 Inhaler 3  . amLODipine (NORVASC) 2.5 MG tablet TAKE 1 TABLET(2.5 MG) BY MOUTH DAILY 90 tablet 0  . bifidobacterium infantis (ALIGN) capsule Take 1 capsule by mouth daily. 21 capsule 0  . BLACK ELDERBERRY,BERRY-FLOWER, PO Take by mouth. Reported on 03/15/2016    . carboxymethylcellulose (REFRESH PLUS) 0.5 % SOLN 1 drop 3 (three) times daily as needed.    . cetirizine (ZYRTEC) 10 MG tablet  Take 10 mg by mouth daily.    . cholecalciferol (VITAMIN D) 1000 units tablet Take 1 capsule by mouth daily.    . colchicine 0.6 MG tablet Take 1 tablet (0.6 mg total) by mouth 2 (two) times daily. 1 po bid x 2-3 days then take 1 a day 30 tablet 0  . Flaxseed, Linseed, (GROUND FLAX SEEDS PO) Take 1 to 1 1/2  Tablespoonful daily    . fluticasone (FLONASE) 50 MCG/ACT nasal spray USE 2 SPRAYS IN EACH  NOSTRIL ONCE A DAY 48 g 3  . latanoprost (XALATAN) 0.005 % ophthalmic solution Place 1 drop into both eyes at bedtime.    . metoprolol tartrate (LOPRESSOR) 25 MG tablet TAKE 1 TABLET BY MOUTH TWICE DAILY AND 1 ADDITIONAL TABLET ONCE DAILY AS NEEDED 270 tablet 0  . montelukast (SINGULAIR) 10 MG tablet Take 1 tablet (10 mg total) by mouth at bedtime. 90 tablet 3  . Multiple Minerals-Vitamins (CALCIUM CITRATE PLUS/MAGNESIUM PO) Take by mouth.    . nystatin cream (MYCOSTATIN) Apply 1 application topically 2 (two) times daily. 90 g 3  . Omega-3 Fatty Acids (FISH OIL PO) Take by mouth.    Marland Kitchen omeprazole (PRILOSEC) 40 MG capsule TAKE 1 CAPSULE BY MOUTH  DAILY 90 capsule 3  . Polyethyl Glycol-Propyl Glycol (SYSTANE) 0.4-0.3 % GEL ophthalmic gel Place 1 application into both eyes.    . sodium chloride (OCEAN) 0.65 % SOLN nasal spray Place 1 spray into both nostrils as needed for congestion.    . travoprost, benzalkonium, (TRAVATAN) 0.004 % ophthalmic solution Place 1 drop  into both eyes at bedtime.    . triamcinolone cream (KENALOG) 0.1 % Apply 1 application topically 2 (two) times daily. 90 g 3  . Vitamin Mixture (ESTER-C PO) Take 1,000 mg by mouth daily.    Marland Kitchen azithromycin (ZITHROMAX Z-PAK) 250 MG tablet 2 tabs a day the first day, then 1 tab a day x 4 days (Patient not taking: Reported on 08/12/2018) 6 tablet 0  . GuaiFENesin (MUCINEX PO) Take by mouth as needed. Reported on 03/15/2016    . methylPREDNISolone (MEDROL) 4 MG tablet 5 tab po qd X 1d then 4 tab po qd X 1d then 3 tab po qd X 1d then 2 tab po qd then 1  tab po qd (Patient not taking: Reported on 08/12/2018) 15 tablet 0   No facility-administered medications prior to visit.     ROS See HPI  Objective:  BP (!) 152/80   Pulse (!) 55   Temp 99.1 F (37.3 C) (Oral)   Ht 5\' 2"  (1.575 m)   Wt 154 lb 12.8 oz (70.2 kg)   SpO2 98%   BMI 28.31 kg/m   BP Readings from Last 3 Encounters:  08/12/18 (!) 152/80  08/05/18 122/80  06/25/18 (!) 141/58    Wt Readings from Last 3 Encounters:  08/12/18 154 lb 12.8 oz (70.2 kg)  08/05/18 155 lb 4 oz (70.4 kg)  06/25/18 165 lb 9.6 oz (75.1 kg)    Physical Exam  Constitutional: She is oriented to person, place, and time. No distress.  HENT:  Right Ear: Tympanic membrane, external ear and ear canal normal.  Left Ear: Tympanic membrane, external ear and ear canal normal.  Nose: No mucosal edema or rhinorrhea. Right sinus exhibits no maxillary sinus tenderness and no frontal sinus tenderness. Left sinus exhibits no maxillary sinus tenderness and no frontal sinus tenderness.  Mouth/Throat: No oropharyngeal exudate.  Neck: Normal range of motion. Neck supple. No thyromegaly present.  Cardiovascular: Normal rate and regular rhythm.  Pulmonary/Chest: Effort normal. She has wheezes. She has rales.  Musculoskeletal: She exhibits no edema.  Lymphadenopathy:    She has no cervical adenopathy.  Neurological: She is alert and oriented to person, place, and time.  Skin: Skin is warm and dry.  Psychiatric: She has a normal mood and affect. Her behavior is normal. Thought content normal.  Vitals reviewed.   Lab Results  Component Value Date   WBC 5.5 06/19/2018   HGB 13.9 06/19/2018   HCT 41.6 06/19/2018   PLT 165.0 06/19/2018   GLUCOSE 88 01/02/2018   CHOL 220 (H) 01/02/2018   TRIG 90.0 01/02/2018   HDL 73.90 01/02/2018   LDLDIRECT 153.3 12/18/2012   LDLCALC 128 (H) 01/02/2018   ALT 16 01/02/2018   AST 21 01/02/2018   NA 138 01/02/2018   K 5.3 (H) 01/02/2018   CL 102 01/02/2018    CREATININE 0.73 01/02/2018   BUN 13 01/02/2018   CO2 29 01/02/2018   TSH 3.24 01/02/2018   INR 0.97 12/15/2009   HGBA1C 5.4 11/01/2011    Dg Foot Complete Left  Result Date: 06/19/2018 CLINICAL DATA:  Left foot pain EXAM: LEFT FOOT - COMPLETE 3+ VIEW COMPARISON:  None. FINDINGS: Marked hallux valgus deformity. Mild soft tissue swelling over the distal first metatarsal. Mild dorsal spurring first metatarsophalangeal joint. Negative for fracture.  No other arthropathy. IMPRESSION: Marked hallux valgus deformity.  No acute abnormality. Electronically Signed   By: Franchot Gallo M.D.   On: 06/19/2018 12:25  Assessment & Plan:   Thy was seen today for follow-up.  Diagnoses and all orders for this visit:  Community acquired pneumonia, unspecified laterality -     DG Chest 2 View -     amoxicillin-clavulanate (AUGMENTIN) 875-125 MG tablet; Take 1 tablet by mouth 2 (two) times daily.  Sensation of chest tightness -     DG Chest 2 View -     amoxicillin-clavulanate (AUGMENTIN) 875-125 MG tablet; Take 1 tablet by mouth 2 (two) times daily.  Globus pharyngeus  Gastroesophageal reflux disease without esophagitis   I have discontinued Milaya M. Ferrero's methylPREDNISolone. I am also having her start on amoxicillin-clavulanate. Additionally, I am having her maintain her GuaiFENesin (MUCINEX PO), acetaminophen, bifidobacterium infantis, travoprost (benzalkonium), Omega-3 Fatty Acids (FISH OIL PO), Multiple Minerals-Vitamins (CALCIUM CITRATE PLUS/MAGNESIUM PO), (BLACK ELDERBERRY,BERRY-FLOWER, PO), sodium chloride, carboxymethylcellulose, colchicine, Vitamin Mixture (ESTER-C PO), albuterol, cholecalciferol, (Flaxseed, Linseed, (GROUND FLAX SEEDS PO)), fluticasone, Polyethyl Glycol-Propyl Glycol, montelukast, omeprazole, cetirizine, latanoprost, nystatin cream, triamcinolone cream, amLODipine, metoprolol tartrate, and azithromycin.  Meds ordered this encounter  Medications  .  amoxicillin-clavulanate (AUGMENTIN) 875-125 MG tablet    Sig: Take 1 tablet by mouth 2 (two) times daily.    Dispense:  20 tablet    Refill:  0    Order Specific Question:   Supervising Provider    Answer:   MATTHEWS, CODY [[id number]]    Problem List Items Addressed This Visit      Digestive   GERD    Other Visit Diagnoses    Community acquired pneumonia, unspecified laterality    -  Primary   Relevant Medications   amoxicillin-clavulanate (AUGMENTIN) 875-125 MG tablet   Other Relevant Orders   DG Chest 2 View (Completed)   Sensation of chest tightness       Relevant Medications   amoxicillin-clavulanate (AUGMENTIN) 875-125 MG tablet   Other Relevant Orders   DG Chest 2 View (Completed)   Globus pharyngeus           Follow-up: Return if symptoms worsen or fail to improve.  Wilfred Lacy, NP

## 2018-08-12 NOTE — Patient Instructions (Addendum)
Take omeprazole once a day x 2weeks, then as needed.  CXR indicates pneumonia. augmentin sent. Need to f/up with pcp in 4weeks to re veal of symptoms and repeat CXR.

## 2018-08-12 NOTE — Telephone Encounter (Signed)
Pt calling in regards to ttaking albuterol inhaler. Pt was seen in office for visit with Charlotte,NP today wants to know if she should be using her albuterol inhaler around the clock even if she is not having symptoms. Pt advised that she should continue to use the albuterol inhaler as needed for wheezing or shortness of breath per instructions on current prescription. Pt verbalized understanding. No further questions at this time.  Reason for Disposition . Caller has medication question, adult has minor symptoms, caller declines triage, and triager answers question  Answer Assessment - Initial Assessment Questions 1. SYMPTOMS: "Do you have any symptoms?"     Pt seen today with Charlotte Nche,NP and diagnosed with PNA 2. SEVERITY: If symptoms are present, ask "Are they mild, moderate or severe?"  no complaints of symptoms voiced at this time  Protocols used: MEDICATION QUESTION CALL-A-AH

## 2018-08-20 ENCOUNTER — Ambulatory Visit: Payer: Self-pay | Admitting: *Deleted

## 2018-08-20 NOTE — Telephone Encounter (Signed)
"  Pt still has a some cough from pneumonia (DX 08/12/18). She is still taking ABX and will complete on 12/13 morning. Pt has OV to f/u with Dr. Carollee Herter 09/01/18. Pt is concerned about when/if she should go out or visit with people for her safety to not get worse and for others safety if she is contagious. Should she wear a mask if she goes out or visits with people? For example she is sending her husband shopping and she stays home".,         Returned call to patient regarding above message. She will finish up her antibiotic on Friday. She gets tired if she does too much. Advised that she tries to do a little bit more at the house each day. So she build up her resistance. She said that she had been trying to do that. No fever and coughing is down.  She would like to go to the grocery store today with her husband. He would be driving and she knows that she will not be there long, because he does not like to walk. Per reference, her pneumonia should be resolved in 7 days."

## 2018-08-25 NOTE — Telephone Encounter (Signed)
Can she get in this week ?

## 2018-08-27 NOTE — Telephone Encounter (Signed)
Patient stated that she still has a little cough but her question was answered

## 2018-09-01 ENCOUNTER — Ambulatory Visit (HOSPITAL_BASED_OUTPATIENT_CLINIC_OR_DEPARTMENT_OTHER)
Admission: RE | Admit: 2018-09-01 | Discharge: 2018-09-01 | Disposition: A | Discharge status: Home / Self Care | Payer: Medicare Other | Source: Ambulatory Visit | Attending: Family Medicine | Admitting: Family Medicine

## 2018-09-01 ENCOUNTER — Encounter: Payer: Self-pay | Admitting: Family Medicine

## 2018-09-01 ENCOUNTER — Ambulatory Visit (INDEPENDENT_AMBULATORY_CARE_PROVIDER_SITE_OTHER): Payer: Medicare Other | Admitting: Family Medicine

## 2018-09-01 VITALS — BP 120/70 | HR 62 | Temp 98.3°F | Resp 16 | Ht 62.0 in | Wt 156.4 lb

## 2018-09-01 DIAGNOSIS — J181 Lobar pneumonia, unspecified organism: Secondary | ICD-10-CM | POA: Insufficient documentation

## 2018-09-01 DIAGNOSIS — J189 Pneumonia, unspecified organism: Secondary | ICD-10-CM

## 2018-09-01 DIAGNOSIS — J4520 Mild intermittent asthma, uncomplicated: Secondary | ICD-10-CM

## 2018-09-01 DIAGNOSIS — I10 Essential (primary) hypertension: Secondary | ICD-10-CM | POA: Diagnosis not present

## 2018-09-01 LAB — CBC WITH DIFFERENTIAL/PLATELET
Basophils Absolute: 0 10*3/uL (ref 0.0–0.1)
Basophils Relative: 0.4 % (ref 0.0–3.0)
EOS ABS: 0 10*3/uL (ref 0.0–0.7)
Eosinophils Relative: 1.1 % (ref 0.0–5.0)
HCT: 42.1 % (ref 36.0–46.0)
Hemoglobin: 14.2 g/dL (ref 12.0–15.0)
LYMPHS ABS: 1.3 10*3/uL (ref 0.7–4.0)
Lymphocytes Relative: 34.3 % (ref 12.0–46.0)
MCHC: 33.8 g/dL (ref 30.0–36.0)
MCV: 91.8 fl (ref 78.0–100.0)
Monocytes Absolute: 0.3 10*3/uL (ref 0.1–1.0)
Monocytes Relative: 9.1 % (ref 3.0–12.0)
NEUTROS PCT: 55.1 % (ref 43.0–77.0)
Neutro Abs: 2 10*3/uL (ref 1.4–7.7)
Platelets: 148 10*3/uL — ABNORMAL LOW (ref 150.0–400.0)
RBC: 4.59 Mil/uL (ref 3.87–5.11)
RDW: 14.2 % (ref 11.5–15.5)
WBC: 3.7 10*3/uL — ABNORMAL LOW (ref 4.0–10.5)

## 2018-09-01 LAB — COMPREHENSIVE METABOLIC PANEL
ALBUMIN: 4.8 g/dL (ref 3.5–5.2)
ALK PHOS: 89 U/L (ref 39–117)
ALT: 19 U/L (ref 0–35)
AST: 24 U/L (ref 0–37)
BUN: 13 mg/dL (ref 6–23)
CO2: 30 mEq/L (ref 19–32)
Calcium: 10 mg/dL (ref 8.4–10.5)
Chloride: 103 mEq/L (ref 96–112)
Creatinine, Ser: 0.68 mg/dL (ref 0.40–1.20)
GFR: 90.15 mL/min (ref >60.00)
Glucose, Bld: 98 mg/dL (ref 70–99)
POTASSIUM: 5.2 meq/L — AB (ref 3.5–5.1)
Sodium: 144 mEq/L (ref 135–145)
TOTAL PROTEIN: 7 g/dL (ref 6.0–8.3)
Total Bilirubin: 0.6 mg/dL (ref 0.2–1.2)

## 2018-09-01 LAB — LIPID PANEL
Cholesterol: 233 mg/dL — ABNORMAL HIGH (ref 0–200)
HDL: 76.6 mg/dL (ref >39.00)
LDL Cholesterol: 139 mg/dL — ABNORMAL HIGH (ref 0–99)
NonHDL: 156.83
Total CHOL/HDL Ratio: 3
Triglycerides: 87 mg/dL (ref 0.0–149.0)
VLDL: 17.4 mg/dL (ref 0.0–40.0)

## 2018-09-01 MED ORDER — ALBUTEROL SULFATE (2.5 MG/3ML) 0.083% IN NEBU: 2.5000 mg | INHALATION_SOLUTION | Freq: Four times a day (QID) | RESPIRATORY_TRACT | 12 refills | Status: DC | PRN

## 2018-09-01 MED ORDER — ALBUTEROL SULFATE HFA 108 (90 BASE) MCG/ACT IN AERS: 2.0000 | INHALATION_SPRAY | Freq: Four times a day (QID) | RESPIRATORY_TRACT | 3 refills | Status: DC | PRN

## 2018-09-01 MED ORDER — ALBUTEROL SULFATE (2.5 MG/3ML) 0.083% IN NEBU
2.5000 mg | INHALATION_SOLUTION | Freq: Once | RESPIRATORY_TRACT | Status: AC
  Administered 2018-09-01: 2.5 mg via RESPIRATORY_TRACT

## 2018-09-01 MED ORDER — BECLOMETHASONE DIPROP HFA 40 MCG/ACT IN AERB: 2.0000 | INHALATION_SPRAY | Freq: Two times a day (BID) | RESPIRATORY_TRACT | 2 refills | Status: DC

## 2018-09-01 NOTE — Patient Instructions (Signed)
Community-Acquired Pneumonia, Adult °Pneumonia is an infection of the lungs. There are different types of pneumonia. One type can develop while a person is in a hospital. A different type, called community-acquired pneumonia, develops in people who are not, or have not recently been, in the hospital or other health care facility. °What are the causes? ° °Pneumonia may be caused by bacteria, viruses, or funguses. Community-acquired pneumonia is often caused by Streptococcus pneumonia bacteria. These bacteria are often passed from one person to another by breathing in droplets from the cough or sneeze of an infected person. °What increases the risk? °The condition is more likely to develop in: °· People who have chronic diseases, such as chronic obstructive pulmonary disease (COPD), asthma, congestive heart failure, cystic fibrosis, diabetes, or kidney disease. °· People who have early-stage or late-stage HIV. °· People who have sickle cell disease. °· People who have had their spleen removed (splenectomy). °· People who have poor dental hygiene. °· People who have medical conditions that increase the risk of breathing in (aspirating) secretions their own mouth and nose. °· People who have a weakened immune system (immunocompromised). °· People who smoke. °· People who travel to areas where pneumonia-causing germs commonly exist. °· People who are around animal habitats or animals that have pneumonia-causing germs, including birds, bats, rabbits, cats, and farm animals. °What are the signs or symptoms? °Symptoms of this condition include: °· A dry cough. °· A wet (productive) cough. °· Fever. °· Sweating. °· Chest pain, especially when breathing deeply or coughing. °· Rapid breathing or difficulty breathing. °· Shortness of breath. °· Shaking chills. °· Fatigue. °· Muscle aches. °How is this diagnosed? °Your health care provider will take a medical history and perform a physical exam. You may also have other tests,  including: °· Imaging studies of your chest, including X-rays. °· Tests to check your blood oxygen level and other blood gases. °· Other tests on blood, mucus (sputum), fluid around your lungs (pleural fluid), and urine. °If your pneumonia is severe, other tests may be done to identify the specific cause of your illness. °How is this treated? °The type of treatment that you receive depends on many factors, such as the cause of your pneumonia, the medicines you take, and other medical conditions that you have. For most adults, treatment and recovery from pneumonia may occur at home. In some cases, treatment must happen in a hospital. Treatment may include: °· Antibiotic medicines, if the pneumonia was caused by bacteria. °· Antiviral medicines, if the pneumonia was caused by a virus. °· Medicines that are given by mouth or through an IV tube. °· Oxygen. °· Respiratory therapy. °Although rare, treating severe pneumonia may include: °· Mechanical ventilation. This is done if you are not breathing well on your own and you cannot maintain a safe blood oxygen level. °· Thoracentesis. This procedure removes fluid around one lung or both lungs to help you breathe better. °Follow these instructions at home: ° °· Take over-the-counter and prescription medicines only as told by your health care provider. °? Only take cough medicine if you are losing sleep. Understand that cough medicine can prevent your body’s natural ability to remove mucus from your lungs. °? If you were prescribed an antibiotic medicine, take it as told by your health care provider. Do not stop taking the antibiotic even if you start to feel better. °· Sleep in a semi-upright position at night. Try sleeping in a reclining chair, or place a few pillows under your head. °· Do not use tobacco products, including cigarettes, chewing tobacco, and e-cigarettes.   If you need help quitting, ask your health care provider. °· Drink enough water to keep your urine  clear or pale yellow. This will help to thin out mucus secretions in your lungs. °How is this prevented? °There are ways that you can decrease your risk of developing community-acquired pneumonia. Consider getting a pneumococcal vaccine if: °· You are older than 73 years of age. °· You are older than 73 years of age and are undergoing cancer treatment, have chronic lung disease, or have other medical conditions that affect your immune system. Ask your health care provider if this applies to you. °There are different types and schedules of pneumococcal vaccines. Ask your health care provider which vaccination option is best for you. °You may also prevent community-acquired pneumonia if you take these actions: °· Get an influenza vaccine every year. Ask your health care provider which type of influenza vaccine is best for you. °· Go to the dentist on a regular basis. °· Wash your hands often. Use hand sanitizer if soap and water are not available. °Contact a health care provider if: °· You have a fever. °· You are losing sleep because you cannot control your cough with cough medicine. °Get help right away if: °· You have worsening shortness of breath. °· You have increased chest pain. °· Your sickness becomes worse, especially if you are an older adult or have a weakened immune system. °· You cough up blood. °This information is not intended to replace advice given to you by your health care provider. Make sure you discuss any questions you have with your health care provider. °Document Released: 08/27/2005 Document Revised: 05/16/2017 Document Reviewed: 12/22/2014 °[employer] Interactive Patient Education © 2019 [employer] ° °

## 2018-09-01 NOTE — Progress Notes (Signed)
Patient ID: BRITANIE HARSHMAN, female    DOB: 07-15-1946  Age: 72 y.o. MRN: [id number]    Subjective:  Subjective  HPI KRIS BURD presents for f/u pneumonia.  She still has some chest tightness--- and is using her rescue inhaler more often.    Review of Systems  Constitutional: Negative for appetite change, diaphoresis, fatigue and unexpected weight change.  Eyes: Negative for pain, redness and visual disturbance.  Respiratory: Positive for chest tightness. Negative for cough, shortness of breath and wheezing.   Cardiovascular: Negative for chest pain, palpitations and leg swelling.  Endocrine: Negative for cold intolerance, heat intolerance, polydipsia, polyphagia and polyuria.  Genitourinary: Negative for difficulty urinating, dysuria and frequency.  Neurological: Negative for dizziness, light-headedness, numbness and headaches.    History Past Medical History:  Diagnosis Date  . Asthma   . CAP (community acquired pneumonia) 08/2012   Dr Birdie Riddle  . Diverticulitis   . Diverticulosis   . Fasting hyperglycemia 04/2011   FBS 108  . Fibromyalgia   . Fundic gland polyps of stomach, benign   . GERD (gastroesophageal reflux disease)    gastric polyp x3  . Glaucoma     Dr Bing Plume  . IBS (irritable bowel syndrome)   . Osteopenia    last 07/2011  . Pneumonia     OP as child  . Shingles     She has a past surgical history that includes Spine surgery; Colonoscopy w/ polypectomy (1194,1740,8144); and Esophagogastroduodenoscopy.   Her family history includes Asthma in her paternal aunt; Breast cancer in her maternal aunt, mother, paternal aunt, sister, and unknown relative; Coronary artery disease in her paternal aunt; Deep vein thrombosis in her son; Heart Problems in her son; Heart attack in her paternal grandmother and paternal uncle; Heart disease in her maternal aunt and paternal grandfather; Heart failure in her father; Kidney disease in her maternal grandmother; Ovarian cancer  in her unknown relative; Pulmonary embolism in her son.She reports that she quit smoking about 44 years ago. Her smoking use included cigarettes. She has a 5.00 pack-year smoking history. She has never used smokeless tobacco. She reports that she does not drink alcohol or use drugs.  Current Outpatient Medications on File Prior to Visit  Medication Sig Dispense Refill  . acetaminophen (TYLENOL) 500 MG tablet Take 500 mg by mouth as needed.    Marland Kitchen amLODipine (NORVASC) 2.5 MG tablet TAKE 1 TABLET(2.5 MG) BY MOUTH DAILY 90 tablet 0  . bifidobacterium infantis (ALIGN) capsule Take 1 capsule by mouth daily. 21 capsule 0  . carboxymethylcellulose (REFRESH PLUS) 0.5 % SOLN 1 drop 3 (three) times daily as needed.    . cetirizine (ZYRTEC) 10 MG tablet Take 10 mg by mouth daily.    . cholecalciferol (VITAMIN D) 1000 units tablet Take 1 capsule by mouth daily.    . colchicine 0.6 MG tablet Take 1 tablet (0.6 mg total) by mouth 2 (two) times daily. 1 po bid x 2-3 days then take 1 a day 30 tablet 0  . fluticasone (FLONASE) 50 MCG/ACT nasal spray USE 2 SPRAYS IN EACH  NOSTRIL ONCE A DAY 48 g 3  . GuaiFENesin (MUCINEX PO) Take by mouth as needed. Reported on 03/15/2016    . latanoprost (XALATAN) 0.005 % ophthalmic solution Place 1 drop into both eyes at bedtime.    . metoprolol tartrate (LOPRESSOR) 25 MG tablet TAKE 1 TABLET BY MOUTH TWICE DAILY AND 1 ADDITIONAL TABLET ONCE DAILY AS NEEDED 270 tablet 0  . montelukast (  SINGULAIR) 10 MG tablet Take 1 tablet (10 mg total) by mouth at bedtime. 90 tablet 3  . Multiple Minerals-Vitamins (CALCIUM CITRATE PLUS/MAGNESIUM PO) Take by mouth.    . nystatin cream (MYCOSTATIN) Apply 1 application topically 2 (two) times daily. 90 g 3  . Omega-3 Fatty Acids (FISH OIL PO) Take by mouth.    Marland Kitchen omeprazole (PRILOSEC) 40 MG capsule TAKE 1 CAPSULE BY MOUTH  DAILY 90 capsule 3  . Polyethyl Glycol-Propyl Glycol (SYSTANE) 0.4-0.3 % GEL ophthalmic gel Place 1 application into both eyes.      . sodium chloride (OCEAN) 0.65 % SOLN nasal spray Place 1 spray into both nostrils as needed for congestion.    . triamcinolone cream (KENALOG) 0.1 % Apply 1 application topically 2 (two) times daily. 90 g 3  . Vitamin Mixture (ESTER-C PO) Take 1,000 mg by mouth daily.     No current facility-administered medications on file prior to visit.      Objective:  Objective  Physical Exam Vitals signs and nursing note reviewed.  Constitutional:      Appearance: She is well-developed.  HENT:     Head: Normocephalic and atraumatic.  Eyes:     Conjunctiva/sclera: Conjunctivae normal.  Neck:     Musculoskeletal: Normal range of motion and neck supple.     Thyroid: No thyromegaly.     Vascular: No carotid bruit or JVD.  Cardiovascular:     Rate and Rhythm: Normal rate and regular rhythm.     Heart sounds: Normal heart sounds. No murmur.  Pulmonary:     Effort: Pulmonary effort is normal. No respiratory distress.     Breath sounds: Normal breath sounds. Decreased air movement present. No wheezing or rales.    Chest:     Chest wall: No tenderness.  Neurological:     Mental Status: She is alert and oriented to person, place, and time.    BP 120/70 (BP Location: Right Arm, Cuff Size: Normal)   Pulse 62   Temp 98.3 F (36.8 C) (Oral)   Resp 16   Ht 5\' 2"  (1.575 m)   Wt 156 lb 6.4 oz (70.9 kg)   SpO2 98%   BMI 28.61 kg/m  Wt Readings from Last 3 Encounters:  09/01/18 156 lb 6.4 oz (70.9 kg)  08/12/18 154 lb 12.8 oz (70.2 kg)  08/05/18 155 lb 4 oz (70.4 kg)     Lab Results  Component Value Date   WBC 5.5 06/19/2018   HGB 13.9 06/19/2018   HCT 41.6 06/19/2018   PLT 165.0 06/19/2018   GLUCOSE 88 01/02/2018   CHOL 220 (H) 01/02/2018   TRIG 90.0 01/02/2018   HDL 73.90 01/02/2018   LDLDIRECT 153.3 12/18/2012   LDLCALC 128 (H) 01/02/2018   ALT 16 01/02/2018   AST 21 01/02/2018   NA 138 01/02/2018   K 5.3 (H) 01/02/2018   CL 102 01/02/2018   CREATININE 0.73 01/02/2018    BUN 13 01/02/2018   CO2 29 01/02/2018   TSH 3.24 01/02/2018   INR 0.97 12/15/2009   HGBA1C 5.4 11/01/2011    Dg Foot Complete Left  Result Date: 06/19/2018 CLINICAL DATA:  Left foot pain EXAM: LEFT FOOT - COMPLETE 3+ VIEW COMPARISON:  None. FINDINGS: Marked hallux valgus deformity. Mild soft tissue swelling over the distal first metatarsal. Mild dorsal spurring first metatarsophalangeal joint. Negative for fracture.  No other arthropathy. IMPRESSION: Marked hallux valgus deformity.  No acute abnormality. Electronically Signed   By: Franchot Gallo  M.D.   On: 06/19/2018 12:25     Assessment & Plan:  Plan  I have discontinued Marijayne M. Decker's travoprost (benzalkonium), (BLACK ELDERBERRY,BERRY-FLOWER, PO), (Flaxseed, Linseed, (GROUND FLAX SEEDS PO)), azithromycin, and amoxicillin-clavulanate. I am also having her start on beclomethasone and albuterol. Additionally, I am having her maintain her GuaiFENesin (MUCINEX PO), acetaminophen, bifidobacterium infantis, Omega-3 Fatty Acids (FISH OIL PO), Multiple Minerals-Vitamins (CALCIUM CITRATE PLUS/MAGNESIUM PO), sodium chloride, carboxymethylcellulose, colchicine, Vitamin Mixture (ESTER-C PO), cholecalciferol, fluticasone, Polyethyl Glycol-Propyl Glycol, montelukast, omeprazole, cetirizine, latanoprost, nystatin cream, triamcinolone cream, amLODipine, metoprolol tartrate, and albuterol. We administered albuterol.  Meds ordered this encounter  Medications  . albuterol (PROVENTIL HFA;VENTOLIN HFA) 108 (90 Base) MCG/ACT inhaler    Sig: Inhale 2 puffs into the lungs every 6 (six) hours as needed for wheezing or shortness of breath. As needed only    Dispense:  1 Inhaler    Refill:  3    Generic inhaler  . albuterol (PROVENTIL) (2.5 MG/3ML) 0.083% nebulizer solution 2.5 mg  . beclomethasone (QVAR REDIHALER) 40 MCG/ACT inhaler    Sig: Inhale 2 puffs into the lungs 2 (two) times daily.    Dispense:  1 Inhaler    Refill:  2  . albuterol (PROVENTIL)  (2.5 MG/3ML) 0.083% nebulizer solution    Sig: Take 3 mLs (2.5 mg total) by nebulization every 6 (six) hours as needed for wheezing or shortness of breath.    Dispense:  75 mL    Refill:  12    Problem List Items Addressed This Visit      Unprioritized   Asthma    Check xray proair refilled Albuterol neb given        Relevant Medications   albuterol (PROVENTIL HFA;VENTOLIN HFA) 108 (90 Base) MCG/ACT inhaler   albuterol (PROVENTIL) (2.5 MG/3ML) 0.083% nebulizer solution 2.5 mg (Completed)   beclomethasone (QVAR REDIHALER) 40 MCG/ACT inhaler   albuterol (PROVENTIL) (2.5 MG/3ML) 0.083% nebulizer solution   Other Relevant Orders   DME Nebulizer machine   Essential hypertension   Relevant Orders   Lipid panel   Comprehensive metabolic panel   Pneumonia of left lower lobe due to infectious organism ([place]) - Primary    Recheck xray today abx finished      Relevant Medications   albuterol (PROVENTIL HFA;VENTOLIN HFA) 108 (90 Base) MCG/ACT inhaler   albuterol (PROVENTIL) (2.5 MG/3ML) 0.083% nebulizer solution 2.5 mg (Completed)   beclomethasone (QVAR REDIHALER) 40 MCG/ACT inhaler   albuterol (PROVENTIL) (2.5 MG/3ML) 0.083% nebulizer solution   Other Relevant Orders   DG Chest 2 View (Completed)   CBC with Differential/Platelet      Follow-up: Return if symptoms worsen or fail to improve.  [name], DO

## 2018-09-01 NOTE — Assessment & Plan Note (Signed)
Recheck xray today abx finished

## 2018-09-01 NOTE — Assessment & Plan Note (Signed)
Check xray proair refilled Albuterol neb given

## 2018-09-02 DIAGNOSIS — J4520 Mild intermittent asthma, uncomplicated: Secondary | ICD-10-CM | POA: Diagnosis not present

## 2018-09-04 ENCOUNTER — Telehealth: Payer: Self-pay | Admitting: Family Medicine

## 2018-09-04 NOTE — Telephone Encounter (Signed)
Copied from [place] [id number]. Topic: Quick Communication - See Telephone Encounter >> Sep 04, 2018  9:49 AM Bea Graff, NT wrote: CRM for notification. See Telephone encounter for: 09/04/18. Pt states that the pharmacy stated that the pts insurance will not cover the beclomethasone (QVAR REDIHALER) 40 MCG/ACT inhaler and may need a substitute for this med ordered or PA?

## 2018-09-05 ENCOUNTER — Other Ambulatory Visit: Payer: Self-pay | Admitting: *Deleted

## 2018-09-05 ENCOUNTER — Ambulatory Visit: Payer: Self-pay | Admitting: *Deleted

## 2018-09-05 DIAGNOSIS — J4520 Mild intermittent asthma, uncomplicated: Secondary | ICD-10-CM

## 2018-09-05 DIAGNOSIS — D72819 Decreased white blood cell count, unspecified: Secondary | ICD-10-CM

## 2018-09-05 MED ORDER — FLUTICASONE PROPIONATE HFA 110 MCG/ACT IN AERO: 1.0000 | INHALATION_SPRAY | Freq: Two times a day (BID) | RESPIRATORY_TRACT | 12 refills | Status: DC

## 2018-09-05 NOTE — Telephone Encounter (Signed)
OK to order Flovent. TY.

## 2018-09-05 NOTE — Telephone Encounter (Signed)
Which mg of flovent diskus?

## 2018-09-05 NOTE — Telephone Encounter (Signed)
Patient is having a problem telling difference between asthma attack and breathing problem. She is having tightness- and deep cough.  Patient has not done a nebulizer treatment yet. Encouraged patient to use the tools Dr Etter SjogrenCheri Rous gave her. She will use the nebulizer treatment and if she does not feel better she will call back.  Patient is still waiting to hear about the Flovent and she feels that will help her too.   Told patient I would let office know her status and that she will notified them if the nebulizer does not help her.  Reason for Disposition . [1] Wheezing or coughing AND [2] hasn't used neb or inhaler twice AND [3] it's available  Answer Assessment - Initial Assessment Questions 1. SYMPTOMS: "Do you have any symptoms?"     Shallow breathing with cough- patient is very nervous about the pneumonia she just had. It has confused her as to what is asthma and what is not. 2. SEVERITY: If symptoms are present, ask "Are they mild, moderate or severe?"     Patient is able to talk and hold conversation- she does cough occasionally. Mild/moderate- patient has not had any treatments this morning.  Answer Assessment - Initial Assessment Questions 1. RESPIRATORY STATUS: "Describe your breathing?" (e.g., wheezing, shortness of breath, unable to speak, severe coughing)      Coughing, some wheezing 2. ONSET: "When did this asthma attack begin?"      Symptoms started after patient had been up and started meoving around this morning 3. TRIGGER: "What do you think triggered this attack?" (e.g., URI, exposure to pollen or other allergen, tobacco smoke)      Exertion- physical activity 4. PEAK EXPIRATORY FLOW RATE (PEFR): "Do you use a peak flow meter?" If so, ask: "What's the current peak flow? What's your personal best peak flow?"      Not asked 5. SEVERITY: "How bad is this attack?"    - MILD: No SOB at rest, mild SOB with walking, speaks normally in sentences, can lay down, no retractions,  pulse < 100. (GREEN Zone: PEFR 80-100%)   - MODERATE: SOB at rest, SOB with minimal exertion and prefers to sit, cannot lie down flat, speaks in phrases, mild retractions, audible wheezing, pulse 100-120. (YELLOW Zone: PEFR 50-80%)    - SEVERE: Very SOB at rest, speaks in single words, struggling to breathe, sitting hunched forward, retractions, usually loud wheezing, sometimes minimal wheezing because of decreased air movement, pulse > 120. (RED Zone: PEFR < 50%).      mild 6. MEDICATIONS (Inhaler or nebs): "What are your asthma medications?" and "What treatments have you given so far?"    - Quick-relief: albuterol, metaproterenol, salbutamol, or other inhaled or nebulized beta-agonist medicines   - Long-term-control: steroids, cromolyn, or other anti-inflammatory medicines.   No medications so far today   7. OTHER SYMPTOMS: "Do you have any other symptoms? (e.g., runny nose, chest pain, fever)     cough 8. PREGNANCY: "Is there any chance you are pregnant?" "When was your last menstrual period?"     n/a  Protocols used: ASTHMA ATTACK-A-AH, MEDICATION QUESTION CALL-A-AH

## 2018-09-05 NOTE — Telephone Encounter (Signed)
110 mcg bid.

## 2018-09-05 NOTE — Telephone Encounter (Signed)
Pt calling back.  States that she has spoken with her insurance and Flovent is the preferred medication. Pt states she would like to be able to pick this up today if possible. Pt can be reached at [phone].

## 2018-09-05 NOTE — Telephone Encounter (Signed)
OK 

## 2018-09-05 NOTE — Telephone Encounter (Signed)
Flovent 149mcg bid ordered per Dr. Nani Ravens. Author phoned pt. to notify, and review adminstration instructions for both flovent, albuterol inhaler and albuterol nebulizer. Good oral hygiene reviewed with pt. Pt. Appreciative of education.

## 2018-09-05 NOTE — Telephone Encounter (Signed)
Call to patient- she is breathing better- can take deep breath and feels that her airways are opened. The nebulizer treatment did help- she needs to sit and rest now. ( She has been cleaning her kitchen)  She is still awaiting call back on the pre authorization of inhaler- Flovent.

## 2018-09-05 NOTE — Addendum Note (Signed)
Addended by: Raynelle Dick R on: 09/05/2018 12:51 PM   Modules accepted: Orders

## 2018-09-05 NOTE — Telephone Encounter (Signed)
Other alternatives are Arnuity Ellipta, Flovent Diskus, or Flovent HFA.  Can we change to one of those or do Prior Auth on QVAR.

## 2018-09-05 NOTE — Telephone Encounter (Signed)
Patient notified by Raquel Sarna.

## 2018-09-09 ENCOUNTER — Telehealth: Payer: Self-pay

## 2018-09-09 ENCOUNTER — Encounter: Payer: Self-pay | Admitting: Nurse Practitioner

## 2018-09-09 ENCOUNTER — Ambulatory Visit: Payer: Medicare Other | Admitting: Nurse Practitioner

## 2018-09-09 VITALS — BP 132/78 | HR 55 | Temp 98.6°F | Ht 62.0 in | Wt 156.0 lb

## 2018-09-09 DIAGNOSIS — J4520 Mild intermittent asthma, uncomplicated: Secondary | ICD-10-CM | POA: Diagnosis not present

## 2018-09-09 DIAGNOSIS — T50905A Adverse effect of unspecified drugs, medicaments and biological substances, initial encounter: Secondary | ICD-10-CM | POA: Diagnosis not present

## 2018-09-09 NOTE — Patient Instructions (Addendum)
Stop flovent Continue albuterol as prescribed.

## 2018-09-09 NOTE — Telephone Encounter (Signed)
Follow up call made to patient regarding her call to [facility] about her facial redness. Husband states patient has gone to [place] office to be seen.

## 2018-09-09 NOTE — Progress Notes (Signed)
Subjective:  Patient ID: Kayla [name], female    DOB: [birth year]  Age: 72 y.o. MRN: [id number]  CC: Rash (pt is complaining of red rash on face,felt warm to touch/going on 3 days/ not sure if she is allergic to flovent (new med she start on Friday). )  Allergic Reaction  This is a new problem. The current episode started 2 days ago. The problem occurs constantly. The problem has been resolved since onset. The problem is mild. Associated with: flovent (new inhaler) The time of exposure was just prior to onset. The exposure occurred at home. Pertinent negatives include no chest pain, coughing, difficulty breathing, globus sensation, rash, stridor, trouble swallowing or wheezing. There is no swelling present. Treatments tried: zyrtec. Her past medical history is significant for medication allergies.    experienced hoarseness and facial flushing after use of flovent for 2days. Last used inhaler yesterday morning. Symptoms have now resolved since discontinuation of flovent.  Reviewed past Medical, Social and Family history today.  Outpatient Medications Prior to Visit  Medication Sig Dispense Refill  . acetaminophen (TYLENOL) 500 MG tablet Take 500 mg by mouth as needed.    Marland Kitchen albuterol (PROVENTIL HFA;VENTOLIN HFA) 108 (90 Base) MCG/ACT inhaler Inhale 2 puffs into the lungs every 6 (six) hours as needed for wheezing or shortness of breath. As needed only 1 Inhaler 3  . albuterol (PROVENTIL) (2.5 MG/3ML) 0.083% nebulizer solution Take 3 mLs (2.5 mg total) by nebulization every 6 (six) hours as needed for wheezing or shortness of breath. 75 mL 12  . amLODipine (NORVASC) 2.5 MG tablet TAKE 1 TABLET(2.5 MG) BY MOUTH DAILY 90 tablet 0  . bifidobacterium infantis (ALIGN) capsule Take 1 capsule by mouth daily. 21 capsule 0  . carboxymethylcellulose (REFRESH PLUS) 0.5 % SOLN 1 drop 3 (three) times daily as needed.    . cetirizine (ZYRTEC) 10 MG tablet Take 10 mg by mouth daily.    . cholecalciferol  (VITAMIN D) 1000 units tablet Take 1 capsule by mouth daily.    . colchicine 0.6 MG tablet Take 1 tablet (0.6 mg total) by mouth 2 (two) times daily. 1 po bid x 2-3 days then take 1 a day 30 tablet 0  . fluticasone (FLONASE) 50 MCG/ACT nasal spray USE 2 SPRAYS IN EACH  NOSTRIL ONCE A DAY 48 g 3  . fluticasone (FLOVENT HFA) 110 MCG/ACT inhaler Inhale 1 puff into the lungs 2 (two) times daily. 1 Inhaler 12  . GuaiFENesin (MUCINEX PO) Take by mouth as needed. Reported on 03/15/2016    . latanoprost (XALATAN) 0.005 % ophthalmic solution Place 1 drop into both eyes at bedtime.    . metoprolol tartrate (LOPRESSOR) 25 MG tablet TAKE 1 TABLET BY MOUTH TWICE DAILY AND 1 ADDITIONAL TABLET ONCE DAILY AS NEEDED 270 tablet 0  . montelukast (SINGULAIR) 10 MG tablet Take 1 tablet (10 mg total) by mouth at bedtime. 90 tablet 3  . Multiple Minerals-Vitamins (CALCIUM CITRATE PLUS/MAGNESIUM PO) Take by mouth.    . nystatin cream (MYCOSTATIN) Apply 1 application topically 2 (two) times daily. 90 g 3  . Omega-3 Fatty Acids (FISH OIL PO) Take by mouth.    Marland Kitchen omeprazole (PRILOSEC) 40 MG capsule TAKE 1 CAPSULE BY MOUTH  DAILY 90 capsule 3  . Polyethyl Glycol-Propyl Glycol (SYSTANE) 0.4-0.3 % GEL ophthalmic gel Place 1 application into both eyes.    . sodium chloride (OCEAN) 0.65 % SOLN nasal spray Place 1 spray into both nostrils as needed for congestion.    Marland Kitchen  triamcinolone cream (KENALOG) 0.1 % Apply 1 application topically 2 (two) times daily. 90 g 3  . Vitamin Mixture (ESTER-C PO) Take 1,000 mg by mouth daily.     No facility-administered medications prior to visit.     ROS See HPI  Objective:  BP 132/78   Pulse (!) 55   Temp 98.6 F (37 C) (Oral)   Ht 5\' 2"  (1.575 m)   Wt 156 lb (70.8 kg)   SpO2 100%   BMI 28.53 kg/m   BP Readings from Last 3 Encounters:  09/09/18 132/78  09/01/18 120/70  08/12/18 (!) 152/80    Wt Readings from Last 3 Encounters:  09/09/18 156 lb (70.8 kg)  09/01/18 156 lb 6.4 oz  (70.9 kg)  08/12/18 154 lb 12.8 oz (70.2 kg)    Physical Exam Constitutional:      Appearance: Normal appearance.  HENT:     Mouth/Throat:     Pharynx: Oropharynx is clear. Uvula midline.  Neck:     Musculoskeletal: Normal range of motion and neck supple.  Cardiovascular:     Rate and Rhythm: Normal rate and regular rhythm.  Pulmonary:     Effort: Pulmonary effort is normal.     Breath sounds: Normal breath sounds.  Neurological:     Mental Status: She is alert.     Lab Results  Component Value Date   WBC 3.7 (L) 09/01/2018   HGB 14.2 09/01/2018   HCT 42.1 09/01/2018   PLT 148.0 (L) 09/01/2018   GLUCOSE 98 09/01/2018   CHOL 233 (H) 09/01/2018   TRIG 87.0 09/01/2018   HDL 76.60 09/01/2018   LDLDIRECT 153.3 12/18/2012   LDLCALC 139 (H) 09/01/2018   ALT 19 09/01/2018   AST 24 09/01/2018   NA 144 09/01/2018   K 5.2 (H) 09/01/2018   CL 103 09/01/2018   CREATININE 0.68 09/01/2018   BUN 13 09/01/2018   CO2 30 09/01/2018   TSH 3.24 01/02/2018   INR 0.97 12/15/2009   HGBA1C 5.4 11/01/2011    Dg Chest 2 View  Result Date: 09/01/2018 CLINICAL DATA:  Left lower lobe pneumonia. EXAM: CHEST - 2 VIEW COMPARISON:  08/12/2018 FINDINGS: There is no focal parenchymal opacity. There is no pleural effusion or pneumothorax. The heart and mediastinal contours are unremarkable. There is a levoscoliosis of the thoracolumbar spine. IMPRESSION: No active cardiopulmonary disease. Electronically Signed   By: Kathreen Devoid   On: 09/01/2018 12:24    Assessment & Plan:   Walta was seen today for rash.  Diagnoses and all orders for this visit:  Medication reaction, initial encounter  Mild intermittent asthma, unspecified whether complicated   I am having Johnnisha M. Pohlmann maintain her GuaiFENesin (MUCINEX PO), acetaminophen, bifidobacterium infantis, Omega-3 Fatty Acids (FISH OIL PO), Multiple Minerals-Vitamins (CALCIUM CITRATE PLUS/MAGNESIUM PO), sodium chloride,  carboxymethylcellulose, colchicine, Vitamin Mixture (ESTER-C PO), cholecalciferol, fluticasone, Polyethyl Glycol-Propyl Glycol, montelukast, omeprazole, cetirizine, latanoprost, nystatin cream, triamcinolone cream, amLODipine, metoprolol tartrate, albuterol, albuterol, and fluticasone.  No orders of the defined types were placed in this encounter.   Problem List Items Addressed This Visit      Respiratory   Asthma    Other Visit Diagnoses    Medication reaction, initial encounter    -  Primary       Follow-up: No follow-ups on file.  Wilfred Lacy, NP

## 2018-09-10 ENCOUNTER — Encounter: Payer: Self-pay | Admitting: Nurse Practitioner

## 2018-09-11 ENCOUNTER — Encounter: Payer: Self-pay | Admitting: Family Medicine

## 2018-09-27 ENCOUNTER — Other Ambulatory Visit: Payer: Self-pay | Admitting: Family Medicine

## 2018-09-27 DIAGNOSIS — I10 Essential (primary) hypertension: Secondary | ICD-10-CM

## 2018-09-29 ENCOUNTER — Other Ambulatory Visit (INDEPENDENT_AMBULATORY_CARE_PROVIDER_SITE_OTHER): Payer: Medicare Other

## 2018-09-29 DIAGNOSIS — D72819 Decreased white blood cell count, unspecified: Secondary | ICD-10-CM | POA: Diagnosis not present

## 2018-09-29 LAB — CBC WITH DIFFERENTIAL/PLATELET
Basophils Absolute: 0 10*3/uL (ref 0.0–0.1)
Basophils Relative: 1 % (ref 0.0–3.0)
EOS PCT: 1.8 % (ref 0.0–5.0)
Eosinophils Absolute: 0.1 10*3/uL (ref 0.0–0.7)
HEMATOCRIT: 38.5 % (ref 36.0–46.0)
Hemoglobin: 13.3 g/dL (ref 12.0–15.0)
Lymphocytes Relative: 23.5 % (ref 12.0–46.0)
Lymphs Abs: 0.9 10*3/uL (ref 0.7–4.0)
MCHC: 34.5 g/dL (ref 30.0–36.0)
MCV: 90.3 fl (ref 78.0–100.0)
Monocytes Absolute: 0.4 10*3/uL (ref 0.1–1.0)
Monocytes Relative: 10 % (ref 3.0–12.0)
Neutro Abs: 2.3 10*3/uL (ref 1.4–7.7)
Neutrophils Relative %: 63.7 % (ref 43.0–77.0)
Platelets: 146 10*3/uL — ABNORMAL LOW (ref 150.0–400.0)
RBC: 4.27 Mil/uL (ref 3.87–5.11)
RDW: 13.7 % (ref 11.5–15.5)
WBC: 3.6 10*3/uL — ABNORMAL LOW (ref 4.0–10.5)

## 2018-10-03 DIAGNOSIS — J4520 Mild intermittent asthma, uncomplicated: Secondary | ICD-10-CM | POA: Diagnosis not present

## 2018-11-03 DIAGNOSIS — J4520 Mild intermittent asthma, uncomplicated: Secondary | ICD-10-CM | POA: Diagnosis not present

## 2018-11-08 ENCOUNTER — Other Ambulatory Visit: Payer: Self-pay | Admitting: Family Medicine

## 2018-11-20 DIAGNOSIS — M150 Primary generalized (osteo)arthritis: Secondary | ICD-10-CM | POA: Diagnosis not present

## 2018-11-20 DIAGNOSIS — R768 Other specified abnormal immunological findings in serum: Secondary | ICD-10-CM | POA: Diagnosis not present

## 2018-11-20 DIAGNOSIS — L52 Erythema nodosum: Secondary | ICD-10-CM | POA: Diagnosis not present

## 2018-11-24 ENCOUNTER — Other Ambulatory Visit: Payer: Self-pay | Admitting: Family Medicine

## 2018-11-24 DIAGNOSIS — I10 Essential (primary) hypertension: Secondary | ICD-10-CM

## 2018-11-26 ENCOUNTER — Other Ambulatory Visit: Payer: Self-pay

## 2018-11-26 ENCOUNTER — Ambulatory Visit (INDEPENDENT_AMBULATORY_CARE_PROVIDER_SITE_OTHER): Payer: Medicare Other | Admitting: Internal Medicine

## 2018-11-26 ENCOUNTER — Encounter: Payer: Self-pay | Admitting: Internal Medicine

## 2018-11-26 ENCOUNTER — Ambulatory Visit: Payer: Self-pay | Admitting: *Deleted

## 2018-11-26 VITALS — BP 132/80 | HR 57 | Temp 98.5°F | Resp 18 | Ht 62.0 in | Wt 156.4 lb

## 2018-11-26 DIAGNOSIS — E875 Hyperkalemia: Secondary | ICD-10-CM

## 2018-11-26 DIAGNOSIS — I7300 Raynaud's syndrome without gangrene: Secondary | ICD-10-CM

## 2018-11-26 LAB — BASIC METABOLIC PANEL
BUN: 14 mg/dL (ref 6–23)
CO2: 30 mEq/L (ref 19–32)
Calcium: 10.1 mg/dL (ref 8.4–10.5)
Chloride: 102 mEq/L (ref 96–112)
Creatinine, Ser: 0.73 mg/dL (ref 0.40–1.20)
GFR: 78.1 mL/min (ref >60.00)
Glucose, Bld: 98 mg/dL (ref 70–99)
Potassium: 6 mEq/L — ABNORMAL HIGH (ref 3.5–5.1)
Sodium: 138 mEq/L (ref 135–145)

## 2018-11-26 LAB — CBC WITH DIFFERENTIAL/PLATELET
Basophils Absolute: 0 10*3/uL (ref 0.0–0.1)
Basophils Relative: 0.5 % (ref 0.0–3.0)
Eosinophils Absolute: 0.1 10*3/uL (ref 0.0–0.7)
Eosinophils Relative: 1.3 % (ref 0.0–5.0)
HCT: 41.6 % (ref 36.0–46.0)
Hemoglobin: 14.1 g/dL (ref 12.0–15.0)
Lymphocytes Relative: 14.4 % (ref 12.0–46.0)
Lymphs Abs: 0.7 10*3/uL (ref 0.7–4.0)
MCHC: 33.8 g/dL (ref 30.0–36.0)
MCV: 91.4 fl (ref 78.0–100.0)
MONO ABS: 0.5 10*3/uL (ref 0.1–1.0)
Monocytes Relative: 9.1 % (ref 3.0–12.0)
Neutro Abs: 3.7 10*3/uL (ref 1.4–7.7)
Neutrophils Relative %: 74.7 % (ref 43.0–77.0)
Platelets: 170 10*3/uL (ref 150.0–400.0)
RBC: 4.55 Mil/uL (ref 3.87–5.11)
RDW: 13.5 % (ref 11.5–15.5)
WBC: 5 10*3/uL (ref 4.0–10.5)

## 2018-11-26 NOTE — Telephone Encounter (Signed)
Routed to [place] in error; will route to correct location, [facility]

## 2018-11-26 NOTE — Progress Notes (Signed)
Subjective:    Patient ID: Kayla [name], female    DOB: 1946-08-21, 73 y.o.   MRN: [id number]  DOS:  11/26/2018 Type of visit - description: Acute visit Symptoms of started several years ago: From time to time the distal portion of her fifth right digit gets "white and cold" Typical triggers are driving, showering, grasping something strongly with her right hands. She is here because she had a particularly bad episode lately when the pallor of the finger lasted longer than usual, associated with some numbness. Symptoms typically get better with rubbing the area and putting the finger in warm water.  Symptoms are more frequent in the wintertime. No other digits are involved  Review of Systems   Past Medical History:  Diagnosis Date  . Asthma   . CAP (community acquired pneumonia) 08/2012   Dr Birdie Riddle  . Diverticulitis   . Diverticulosis   . Fasting hyperglycemia 04/2011   FBS 108  . Fibromyalgia   . Fundic gland polyps of stomach, benign   . GERD (gastroesophageal reflux disease)    gastric polyp x3  . Glaucoma     Dr Bing Plume  . IBS (irritable bowel syndrome)   . Osteopenia    last 07/2011  . Pneumonia     OP as child  . Shingles     Past Surgical History:  Procedure Laterality Date  . COLONOSCOPY W/ POLYPECTOMY  [phone]   last  colonoscopy 2007, Dr Carlean Purl  . ESOPHAGOGASTRODUODENOSCOPY    . SPINE SURGERY     T9-L5 fusions    Social History   Socioeconomic History  . Marital status: Married    Spouse name: Not on file  . Number of children: Not on file  . Years of education: Not on file  . Highest education level: Not on file  Occupational History  . Not on file  Social Needs  . Financial resource strain: Not on file  . Food insecurity:    Worry: Not on file    Inability: Not on file  . Transportation needs:    Medical: Not on file    Non-medical: Not on file  Tobacco Use  . Smoking status: Former Smoker    Packs/day: 0.50    Years: 10.00     Pack years: 5.00    Types: Cigarettes    Last attempt to quit: 09/10/1974    Years since quitting: 44.2  . Smokeless tobacco: Never Used  . Tobacco comment: smoked 1966- 1976, up to 1 ppd  Substance and Sexual Activity  . Alcohol use: No    Alcohol/week: 0.0 standard drinks  . Drug use: No  . Sexual activity: Yes  Lifestyle  . Physical activity:    Days per week: Not on file    Minutes per session: Not on file  . Stress: Not on file  Relationships  . Social connections:    Talks on phone: Not on file    Gets together: Not on file    Attends religious service: Not on file    Active member of club or organization: Not on file    Attends meetings of clubs or organizations: Not on file    Relationship status: Not on file  . Intimate partner violence:    Fear of current or ex partner: Not on file    Emotionally abused: Not on file    Physically abused: Not on file    Forced sexual activity: Not on file  Other Topics Concern  .  Not on file  Social History Narrative   Daily caffeine 3 cups   Regular exercise   Married            Allergies as of 11/26/2018      Reactions   Cephalosporins    Blisters  orally   Gabapentin    REACTION: rash   Levofloxacin    REACTION: stomach ache and rash   Psyllium    REACTION: rash   Sulfonamide Derivatives Anaphylaxis   REACTION: shock, urticaria   Aspirin    REACTION: stomach pain   Belladonna    Ciprofloxacin Hcl    Conjugated Estrogens    Doxycycline    Abdominal pain   Flovent Hfa [fluticasone]    Nabumetone    Nsaids    Soybean-containing Drug Products Other (See Comments)   GI upset   Zicam Cold Remedy [erysidoron #1]    Zocor [simvastatin] Itching   Prednisone Rash   NO PROBLEM WITH MEDROL DOSE PAK Oral prednisone caused facial burning, "made my face feel like it was on fire"      Medication List       Accurate as of November 26, 2018 11:59 PM. Always use your most recent med list.        acetaminophen 500  MG tablet Commonly known as:  TYLENOL Take 500 mg by mouth as needed.   albuterol 108 (90 Base) MCG/ACT inhaler Commonly known as:  PROVENTIL HFA;VENTOLIN HFA Inhale 2 puffs into the lungs every 6 (six) hours as needed for wheezing or shortness of breath. As needed only   albuterol (2.5 MG/3ML) 0.083% nebulizer solution Commonly known as:  PROVENTIL Take 3 mLs (2.5 mg total) by nebulization every 6 (six) hours as needed for wheezing or shortness of breath.   amLODipine 2.5 MG tablet Commonly known as:  NORVASC TAKE 1 TABLET(2.5 MG) BY MOUTH DAILY   bifidobacterium infantis capsule Take 1 capsule by mouth daily.   CALCIUM CITRATE PLUS/MAGNESIUM PO Take by mouth.   carboxymethylcellulose 0.5 % Soln Commonly known as:  REFRESH PLUS 1 drop 3 (three) times daily as needed.   cetirizine 10 MG tablet Commonly known as:  ZYRTEC Take 10 mg by mouth daily.   cholecalciferol 1000 units tablet Commonly known as:  VITAMIN D Take 1 capsule by mouth daily.   colchicine 0.6 MG tablet Take 1 tablet (0.6 mg total) by mouth 2 (two) times daily. 1 po bid x 2-3 days then take 1 a day   ESTER-C PO Take 1,000 mg by mouth daily.   FISH OIL PO Take by mouth.   fluticasone 110 MCG/ACT inhaler Commonly known as:  Flovent HFA Inhale 1 puff into the lungs 2 (two) times daily.   fluticasone 50 MCG/ACT nasal spray Commonly known as:  FLONASE USE 2 SPRAYS IN EACH  NOSTRIL ONCE A DAY   latanoprost 0.005 % ophthalmic solution Commonly known as:  XALATAN Place 1 drop into both eyes at bedtime.   metoprolol tartrate 25 MG tablet Commonly known as:  LOPRESSOR TAKE 1 TABLET BY MOUTH TWICE DAILY AND 1 ADDITIONAL TABLET ONCE DAILY AS NEEDED   montelukast 10 MG tablet Commonly known as:  SINGULAIR TAKE 1 TABLET(10 MG) BY MOUTH DAILY   MUCINEX PO Take by mouth as needed. Reported on 03/15/2016   nystatin cream Commonly known as:  MYCOSTATIN Apply 1 application topically 2 (two) times daily.    omeprazole 40 MG capsule Commonly known as:  PRILOSEC TAKE 1 CAPSULE BY MOUTH  DAILY   sodium chloride 0.65 % Soln nasal spray Commonly known as:  OCEAN Place 1 spray into both nostrils as needed for congestion.   Systane 0.4-0.3 % Gel ophthalmic gel Generic drug:  Polyethyl Glycol-Propyl Glycol Place 1 application into both eyes.   triamcinolone cream 0.1 % Commonly known as:  KENALOG Apply 1 application topically 2 (two) times daily.           Objective:   Physical Exam BP 132/80 (BP Location: Left Arm, Patient Position: Sitting, Cuff Size: Large)   Pulse (!) 57   Temp 98.5 F (36.9 C) (Oral)   Resp 18   Ht 5\' 2"  (1.575 m)   Wt 156 lb 6.4 oz (70.9 kg)   SpO2 98%   BMI 28.61 kg/m  General:   Well developed, NAD, BMI noted. HEENT:  Normocephalic . Face symmetric, atraumatic MSK: Hands: Joints with bony changes consistent with DJD, no puffiness. Specifically, the right fifth finger is warm, good capillary refill and otherwise normal.  skin: Not pale. Not jaundice Neurologic:  alert & oriented X3.  Speech normal, gait appropriate for age and unassisted Psych--  Cognition and judgment appear intact.  Cooperative with normal attention span and concentration.  Behavior appropriate. No anxious or depressed appearing.      Assessment       73 year old female, PMH includes asthma, DJD, HTN, fibromyalgia, glaucoma, hyperlipidemia, osteoporosis, presents with   Raynaud phenomena? Symptoms suspicious for Raynaud's phenomena, I explained the patient my impression, what Raynaud's is, the need to avoid triggers. If symptoms are severe or persistent she needs to call immediately. She has actually seen a rheumatologist before for a question of autoimmune disease but never mentioned that to her, and at the last visit she was cleared and recommended to go back to them as needed only. She request to recheck a CBC that PCP was planning to do.  We will do that and also a BMP.  Otherwise recommend observation. Hyperkalemia: Labs to the came back with potassium of 6.0, he does not take medications that could cause that, will repeat a BMP with no tourniquet.

## 2018-11-26 NOTE — Telephone Encounter (Addendum)
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

## 2018-11-26 NOTE — Telephone Encounter (Signed)
Noted  

## 2018-11-26 NOTE — Patient Instructions (Addendum)
GO TO THE LAB : Get the blood work    Call if the symptoms are severe or more frequent        Raynaud Phenomenon  Raynaud phenomenon is a condition that affects the blood vessels (arteries) that carry blood to your fingers and toes. The arteries that supply blood to your ears, lips, nipples, or the tip of your nose might also be affected. Raynaud phenomenon causes the arteries to become narrow temporarily (spasm). As a result, the flow of blood to the affected areas is temporarily decreased. This usually occurs in response to cold temperatures or stress. During an attack, the skin in the affected areas turns white, then blue, and finally red. You may also feel tingling or numbness in those areas. Attacks usually last for only a brief period, and then the blood flow to the area returns to normal. In most cases, Raynaud phenomenon does not cause serious health problems. What are the causes? In many cases, the cause of this condition is not known. The condition may occur on its own (primary Raynaud phenomenon) or may be associated with other diseases or factors (secondary Raynaud phenomenon). Possible causes may include:  Diseases or medical conditions that damage the arteries.  Injuries and repetitive actions that hurt the hands or feet.  Being exposed to certain chemicals.  Taking medicines that narrow the arteries.  Other medical conditions, such as lupus, scleroderma, rheumatoid arthritis, thyroid problems, blood disorders, Sjogren syndrome, or atherosclerosis. What increases the risk? The following factors may make you more likely to develop this condition:  Being 33-58 years old.  Being female.  Having a family history of Raynaud phenomenon.  Living in a cold climate.  Smoking. What are the signs or symptoms? Symptoms of this condition usually occur when you are exposed to cold temperatures or when you have emotional stress. The symptoms may last for a few minutes or up to  several hours. They usually affect your fingers but may also affect your toes, nipples, lips, ears, or the tip of your nose. Symptoms may include:  Changes in skin color. The skin in the affected areas will turn pale or white. The skin may then change from white to bluish to red as normal blood flow returns to the area.  Numbness, tingling, or pain in the affected areas. In severe cases, symptoms may include:  Skin sores.  Tissues decaying and dying (gangrene). How is this diagnosed? This condition may be diagnosed based on:  Your symptoms and medical history.  A physical exam. During the exam, you may be asked to put your hands in cold water to check for a reaction to cold temperature.  Tests, such as: ? Blood tests to check for other diseases or conditions. ? A test to check the movement of blood through your arteries and veins (vascular ultrasound). ? A test in which the skin at the base of your fingernail is examined under a microscope (nailfold capillaroscopy). How is this treated? Treatment for this condition often involves making lifestyle changes and taking steps to control your exposure to cold temperatures. For more severe cases, medicine (calcium channel blockers) may be used to improve blood flow. Surgery is sometimes done to block the nerves that control the affected arteries, but this is rare. Follow these instructions at home: Avoiding cold temperatures Take these steps to avoid exposure to cold:  If possible, stay indoors during cold weather.  When you go outside during cold weather, dress in layers and wear mittens, a  hat, a scarf, and warm footwear.  Wear mittens or gloves when handling ice or frozen food.  Use holders for glasses or cans containing cold drinks.  Let warm water run for a while before taking a shower or bath.  Warm up the car before driving in cold weather. Lifestyle   If possible, avoid stressful and emotional situations. Try to find ways to  manage your stress, such as: ? Exercise. ? Yoga. ? Meditation. ? Biofeedback.  Do not use any products that contain nicotine or tobacco, such as cigarettes and e-cigarettes. If you need help quitting, ask your health care provider.  Avoid secondhand smoke.  Limit your use of caffeine. ? Switch to decaffeinated coffee, tea, and soda. ? Avoid chocolate.  Avoid vibrating tools and machinery. General instructions  Protect your hands and feet from injuries, cuts, or bruises.  Avoid wearing tight rings or wristbands.  Wear loose fitting socks and comfortable, roomy shoes.  Take over-the-counter and prescription medicines only as told by your health care provider. Contact a health care provider if:  Your discomfort becomes worse despite lifestyle changes.  You develop sores on your fingers or toes that do not heal.  Your fingers or toes turn black.  You have breaks in the skin on your fingers or toes.  You have a fever.  You have pain or swelling in your joints.  You have a rash.  Your symptoms occur on only one side of your body. Summary  Raynaud phenomenon is a condition that affects the arteries that carry blood to your fingers, toes, ears, lips, nipples, or the tip of your nose.  In many cases, the cause of this condition is not known.  Symptoms of this condition include changes in skin color, and numbness and tingling of the affected area.  Treatment for this condition includes lifestyle changes, reducing exposure to cold temperatures, and using medicines for severe cases of the condition.  Contact your health care provider if your condition worsens despite treatment. This information is not intended to replace advice given to you by your health care provider. Make sure you discuss any questions you have with your health care provider. Document Released: 08/24/2000 Document Revised: 03/12/2017 Document Reviewed: 10/08/2016 Elsevier Interactive Patient Education   2019 [employer].

## 2018-11-27 ENCOUNTER — Other Ambulatory Visit (INDEPENDENT_AMBULATORY_CARE_PROVIDER_SITE_OTHER): Payer: Medicare Other

## 2018-11-27 DIAGNOSIS — E875 Hyperkalemia: Secondary | ICD-10-CM

## 2018-11-27 LAB — BASIC METABOLIC PANEL
BUN: 12 mg/dL (ref 6–23)
CO2: 28 mEq/L (ref 19–32)
Calcium: 9.5 mg/dL (ref 8.4–10.5)
Chloride: 100 mEq/L (ref 96–112)
Creatinine, Ser: 0.73 mg/dL (ref 0.40–1.20)
GFR: 78.1 mL/min (ref >60.00)
GLUCOSE: 88 mg/dL (ref 70–99)
Potassium: 4.3 mEq/L (ref 3.5–5.1)
Sodium: 135 mEq/L (ref 135–145)

## 2018-12-02 DIAGNOSIS — J4520 Mild intermittent asthma, uncomplicated: Secondary | ICD-10-CM | POA: Diagnosis not present

## 2019-01-02 DIAGNOSIS — J4520 Mild intermittent asthma, uncomplicated: Secondary | ICD-10-CM | POA: Diagnosis not present

## 2019-01-02 NOTE — Progress Notes (Signed)
Virtual Visit via Video Note  I connected with patient on 01/05/19 at  1:00 PM EDT by a video enabled telemedicine application and verified that I am speaking with the correct person using two identifiers.   THIS ENCOUNTER IS A VIRTUAL VISIT DUE TO COVID-19 - PATIENT WAS NOT SEEN IN THE OFFICE. PATIENT HAS CONSENTED TO VIRTUAL VISIT / TELEMEDICINE VISIT   Location of patient: home  Location of provider: office  I discussed the limitations of evaluation and management by telemedicine and the availability of in person appointments. The patient expressed understanding and agreed to proceed.   Subjective:   Kayla [name] is a 73 y.o. female who presents for Medicare Annual (Subsequent) preventive examination.  Review of Systems: No ROS.  Medicare Wellness Visit. Additional risk factors are reflected in the social history. Cardiac Risk Factors include: advanced age (>3men, >46 women) Sleep patterns: no issues Home Safety/Smoke Alarms: Feels safe in home. Smoke alarms in place.     Female:      Mammo- pt states she will schedule with [facility] after Covid 19.    Dexa scan-  pt states she will schedule with [facility] after Covid 19. Order placed.          CCS- 2017. No recall due to age     Objective:     Vitals: Unable to assess. This visit is enabled though telemedicine due to Covid 19.   Advanced Directives 01/05/2019 07/17/2017 08/22/2015  Does Patient Have a Medical Advance Directive? Yes No Yes  Type of Paramedic of [place];Living will - [place];Living will  Does patient want to make changes to medical advance directive? No - Patient declined - No - Patient declined  Copy of [place] in Chart? No - copy requested - No - copy requested  Would patient like information on creating a medical advance directive? - No - Patient declined -    Tobacco Social History   Tobacco Use  Smoking Status Former Smoker  .  Packs/day: 0.50  . Years: 10.00  . Pack years: 5.00  . Types: Cigarettes  . Last attempt to quit: 09/10/1974  . Years since quitting: 44.3  Smokeless Tobacco Never Used  Tobacco Comment   smoked 1966- 1976, up to 1 ppd     Counseling given: Not Answered Comment: smoked 1966- 1976, up to 1 ppd   Clinical Intake: Pain : No/denies pain    Past Medical History:  Diagnosis Date  . Asthma   . CAP (community acquired pneumonia) 08/2012   Dr Birdie Riddle  . Diverticulitis   . Diverticulosis   . Fasting hyperglycemia 04/2011   FBS 108  . Fibromyalgia   . Fundic gland polyps of stomach, benign   . GERD (gastroesophageal reflux disease)    gastric polyp x3  . Glaucoma     Dr Bing Plume  . IBS (irritable bowel syndrome)   . Osteopenia    last 07/2011  . Pneumonia     OP as child  . Shingles    Past Surgical History:  Procedure Laterality Date  . COLONOSCOPY W/ POLYPECTOMY  [phone]   last  colonoscopy 2007, Dr Carlean Purl  . ESOPHAGOGASTRODUODENOSCOPY    . SPINE SURGERY     T9-L5 fusions   Family History  Problem Relation Age of Onset  . Breast cancer Mother   . Breast cancer Sister   . Breast cancer Maternal Aunt        two  .  Breast cancer Paternal Aunt   . Asthma Paternal Aunt   . Heart failure Father        CHF  . Kidney disease Maternal Grandmother   . Ovarian cancer Other        Niece x2  . Breast cancer Other        niece  . Coronary artery disease Paternal Aunt        triple CABG  . Heart attack Paternal Grandmother        MI in late 24s  . Heart disease Paternal Grandfather   . Heart attack Paternal Uncle         MI in 27s  . Heart disease Maternal Aunt   . Pulmonary embolism Son   . Deep vein thrombosis Son   . Heart Problems Son   . Diabetes Neg Hx   . Stroke Neg Hx   . COPD Neg Hx   . Colon polyps Neg Hx    Social History   Socioeconomic History  . Marital status: Married    Spouse name: Not on file  . Number of children: Not on file  . Years  of education: Not on file  . Highest education level: Not on file  Occupational History  . Not on file  Social Needs  . Financial resource strain: Not on file  . Food insecurity:    Worry: Not on file    Inability: Not on file  . Transportation needs:    Medical: Not on file    Non-medical: Not on file  Tobacco Use  . Smoking status: Former Smoker    Packs/day: 0.50    Years: 10.00    Pack years: 5.00    Types: Cigarettes    Last attempt to quit: 09/10/1974    Years since quitting: 44.3  . Smokeless tobacco: Never Used  . Tobacco comment: smoked 1966- 1976, up to 1 ppd  Substance and Sexual Activity  . Alcohol use: No    Alcohol/week: 0.0 standard drinks  . Drug use: No  . Sexual activity: Yes  Lifestyle  . Physical activity:    Days per week: Not on file    Minutes per session: Not on file  . Stress: Not on file  Relationships  . Social connections:    Talks on phone: Not on file    Gets together: Not on file    Attends religious service: Not on file    Active member of club or organization: Not on file    Attends meetings of clubs or organizations: Not on file    Relationship status: Not on file  Other Topics Concern  . Not on file  Social History Narrative   Daily caffeine 3 cups   Regular exercise   Married          Outpatient Encounter Medications as of 01/05/2019  Medication Sig  . acetaminophen (TYLENOL) 500 MG tablet Take 500 mg by mouth as needed.  Marland Kitchen albuterol (PROVENTIL HFA;VENTOLIN HFA) 108 (90 Base) MCG/ACT inhaler Inhale 2 puffs into the lungs every 6 (six) hours as needed for wheezing or shortness of breath. As needed only  . albuterol (PROVENTIL) (2.5 MG/3ML) 0.083% nebulizer solution Take 3 mLs (2.5 mg total) by nebulization every 6 (six) hours as needed for wheezing or shortness of breath.  Marland Kitchen amLODipine (NORVASC) 2.5 MG tablet TAKE 1 TABLET(2.5 MG) BY MOUTH DAILY  . bifidobacterium infantis (ALIGN) capsule Take 1 capsule by mouth daily.  .  carboxymethylcellulose (REFRESH  PLUS) 0.5 % SOLN 1 drop 3 (three) times daily as needed.  . cholecalciferol (VITAMIN D) 1000 units tablet Take 1 capsule by mouth daily.  . colchicine 0.6 MG tablet Take 1 tablet (0.6 mg total) by mouth 2 (two) times daily. 1 po bid x 2-3 days then take 1 a day  . fluticasone (FLONASE) 50 MCG/ACT nasal spray USE 2 SPRAYS IN EACH  NOSTRIL ONCE A DAY  . GuaiFENesin (MUCINEX PO) Take by mouth as needed. Reported on 03/15/2016  . latanoprost (XALATAN) 0.005 % ophthalmic solution Place 1 drop into both eyes at bedtime.  . metoprolol tartrate (LOPRESSOR) 25 MG tablet TAKE 1 TABLET BY MOUTH TWICE DAILY AND 1 ADDITIONAL TABLET ONCE DAILY AS NEEDED  . montelukast (SINGULAIR) 10 MG tablet TAKE 1 TABLET(10 MG) BY MOUTH DAILY  . Multiple Minerals-Vitamins (CALCIUM CITRATE PLUS/MAGNESIUM PO) Take by mouth.  . nystatin cream (MYCOSTATIN) Apply 1 application topically 2 (two) times daily.  . Omega-3 Fatty Acids (FISH OIL PO) Take by mouth.  Marland Kitchen omeprazole (PRILOSEC) 40 MG capsule TAKE 1 CAPSULE BY MOUTH  DAILY  . Polyethyl Glycol-Propyl Glycol (SYSTANE) 0.4-0.3 % GEL ophthalmic gel Place 1 application into both eyes.  . sodium chloride (OCEAN) 0.65 % SOLN nasal spray Place 1 spray into both nostrils as needed for congestion.  . triamcinolone cream (KENALOG) 0.1 % Apply 1 application topically 2 (two) times daily.  . Vitamin Mixture (ESTER-C PO) Take 1,000 mg by mouth daily.  . cetirizine (ZYRTEC) 10 MG tablet Take 10 mg by mouth daily.  . [DISCONTINUED] fluticasone (FLOVENT HFA) 110 MCG/ACT inhaler Inhale 1 puff into the lungs 2 (two) times daily.   No facility-administered encounter medications on file as of 01/05/2019.     Activities of Daily Living In your present state of health, do you have any difficulty performing the following activities: 01/05/2019 08/05/2018  Hearing? N N  Vision? N N  Difficulty concentrating or making decisions? N N  Walking or climbing stairs? N N   Dressing or bathing? N N  Doing errands, shopping? N N  Preparing Food and eating ? N -  Using the Toilet? N -  In the past six months, have you accidently leaked urine? N -  Do you have problems with loss of bowel control? N -  Managing your Medications? N -  Managing your Finances? N -  Housekeeping or managing your Housekeeping? N -  Some recent data might be hidden    Patient Care Team: Carollee Herter, Alferd Apa, DO as PCP - General (Family Medicine) Gatha Mayer, MD as Consulting Physician (Gastroenterology) Calvert Cantor, MD as Consulting Physician (Ophthalmology) Beshears, Dorie Rank, DMD (Dentistry) Minus Breeding, MD as Consulting Physician (Cardiology) Izora Gala, MD as Consulting Physician (Otolaryngology) Lewanda Rife, Bennie Pierini., DDS as Consulting Physician (Oral Surgery) Gavin Pound, MD as Consulting Physician (Rheumatology) Philemon Kingdom, MD as Consulting Physician (Endocrinology)    Assessment:   This is a routine wellness examination for Tashonna. Physical assessment deferred to PCP.  Exercise Activities and Dietary recommendations Current Exercise Habits: Home exercise routine, Time (Minutes): 20, Frequency (Times/Week): 3, Weekly Exercise (Minutes/Week): 60, Exercise limited by: None identified  Diet (meal preparation, eat out, water intake, caffeinated beverages, dairy products, fruits and vegetables): well balanced, on average, 3 meals per day  Goals    . maintain current healthy lifestyle (pt-stated)       Fall Risk Fall Risk  01/02/2018 07/17/2017 07/02/2017 12/30/2015 08/22/2015  Falls in the past year? No  No No No Yes  Number falls in past yr: - - - - 1  Injury with Fall? - - - - No  Follow up - - - - Falls evaluation completed   Depression Screen PHQ 2/9 Scores 01/02/2018 07/17/2017 07/02/2017 08/23/2016  PHQ - 2 Score 0 0 0 0  Exception Documentation - - - -     Cognitive Function Ad8 score reviewed for issues:  Issues making decisions:no   Less interest in hobbies / activities:no  Repeats questions, stories (family complaining):no  Trouble using ordinary gadgets (microwave, computer, phone):no  Forgets the month or year: no  Mismanaging finances: no  Remembering appts:no  Daily problems with thinking and/or memory:no Ad8 score is=0     MMSE - Mini Mental State Exam 07/17/2017  Orientation to time 5  Orientation to Place 5  Registration 3  Attention/ Calculation 5  Recall 3  Language- name 2 objects 2  Language- repeat 1  Language- follow 3 step command 3  Language- read & follow direction 1  Write a sentence 1  Copy design 1  Total score 30        Immunization History  Administered Date(s) Administered  . Influenza Whole 06/14/2008, 06/09/2010, 07/06/2011  . Influenza, High Dose Seasonal PF 06/10/2014, 05/26/2015, 05/24/2016, 07/02/2017  . Influenza, Seasonal, Injecte, Preservative Fre 05/26/2013  . Influenza-Unspecified 05/30/2012, 05/22/2013, 05/15/2018  . Pneumococcal Conjugate-13 01/11/2014  . Pneumococcal Polysaccharide-23 07/12/2011  . Td 03/07/2006  . Tdap 02/17/2015  . Zoster 08/19/2013  . Zoster Recombinat (Shingrix) 12/08/2017, 03/21/2018   Screening Tests Health Maintenance  Topic Date Due  . MAMMOGRAM  11/22/2018  . INFLUENZA VACCINE  04/11/2019  . TETANUS/TDAP  02/16/2025  . COLONOSCOPY  03/15/2026  . DEXA SCAN  Completed  . Hepatitis C Screening  Completed  . PNA vac Low Risk Adult  Completed    Plan:    Please schedule your next medicare wellness visit with me in 1 yr.  Continue to eat heart healthy diet (full of fruits, vegetables, whole grains, lean protein, water--limit salt, fat, and sugar intake) and increase physical activity as tolerated.  Bring a copy of your living will and/or healthcare power of attorney to your next office visit.  Continue doing brain stimulating activities (puzzles, reading, adult coloring books, staying active) to keep memory sharp.   I  have ordered your bone density scan. Please schedule when you are comfortable.    I have personally reviewed and noted the following in the patient's chart:   . Medical and social history . Use of alcohol, tobacco or illicit drugs  . Current medications and supplements . Functional ability and status . Nutritional status . Physical activity . Advanced directives . List of other physicians . Hospitalizations, surgeries, and ER visits in previous 12 months . Vitals . Screenings to include cognitive, depression, and falls . Referrals and appointments  In addition, I have reviewed and discussed with patient certain preventive protocols, quality metrics, and best practice recommendations. A written personalized care plan for preventive services as well as general preventive health recommendations were provided to patient.     Naaman Plummer [place], [place]  01/05/2019

## 2019-01-05 ENCOUNTER — Ambulatory Visit (INDEPENDENT_AMBULATORY_CARE_PROVIDER_SITE_OTHER): Payer: Medicare Other | Admitting: Family Medicine

## 2019-01-05 ENCOUNTER — Encounter: Payer: Self-pay | Admitting: *Deleted

## 2019-01-05 ENCOUNTER — Other Ambulatory Visit: Payer: Self-pay

## 2019-01-05 ENCOUNTER — Encounter: Payer: Self-pay | Admitting: Family Medicine

## 2019-01-05 ENCOUNTER — Ambulatory Visit (INDEPENDENT_AMBULATORY_CARE_PROVIDER_SITE_OTHER): Payer: Medicare Other | Admitting: *Deleted

## 2019-01-05 DIAGNOSIS — Z780 Asymptomatic menopausal state: Secondary | ICD-10-CM

## 2019-01-05 DIAGNOSIS — D229 Melanocytic nevi, unspecified: Secondary | ICD-10-CM

## 2019-01-05 DIAGNOSIS — Z0000 Encounter for general adult medical examination without abnormal findings: Secondary | ICD-10-CM

## 2019-01-05 NOTE — Progress Notes (Signed)
Reviewed  Pt had video visit with me today

## 2019-01-05 NOTE — Progress Notes (Signed)
Virtual Visit via Video Note  I connected with Kayla [name] on 01/05/19 at  3:15 PM EDT by a video enabled telemedicine application and verified that I am speaking with the correct person using two identifiers.   I discussed the limitations of evaluation and management by telemedicine and the availability of in person appointments. The patient expressed understanding and agreed to proceed.  History of Present Illness: Pt is home and c/o spot on chest that will not go away.  It is slightly raised , sometimes itchy but it will not heal.  She has tried a few otc on it with no relief   Observations/Objective: Nor vitals due to video visit  Area of errythema on pt chest about 1 in diameter,  Itchy per pt  Very slightly raised No d/c   Assessment and Plan: 1. Suspicious nevus Pt husband sees dr Elvera Lennox--- will refer pt to him  rto prn  - Ambulatory referral to Dermatology   Follow Up Instructions:    I discussed the assessment and treatment plan with the patient. The patient was provided an opportunity to ask questions and all were answered. The patient agreed with the plan and demonstrated an understanding of the instructions.   The patient was advised to call back or seek an in-person evaluation if the symptoms worsen or if the condition fails to improve as anticipated.  I provided 15 minutes of non-face-to-face time during this encounter.   [name], DO

## 2019-01-05 NOTE — Patient Instructions (Signed)
Please schedule your next medicare wellness visit with me in 1 yr.  Continue to eat heart healthy diet (full of fruits, vegetables, whole grains, lean protein, water--limit salt, fat, and sugar intake) and increase physical activity as tolerated.  Bring a copy of your living will and/or healthcare power of attorney to your next office visit.  Continue doing brain stimulating activities (puzzles, reading, adult coloring books, staying active) to keep memory sharp.   I have ordered your bone density scan. Please schedule when you are comfortable.    Kayla [name] , Thank you for taking time to come for your Medicare Wellness Visit. I appreciate your ongoing commitment to your health goals. Please review the following plan we discussed and let me know if I can assist you in the future.   These are the goals we discussed: Goals    . maintain current healthy lifestyle (pt-stated)       This is a list of the screening recommended for you and due dates:  Health Maintenance  Topic Date Due  . Mammogram  11/22/2018  . Flu Shot  04/11/2019  . Tetanus Vaccine  02/16/2025  . Colon Cancer Screening  03/15/2026  . DEXA scan (bone density measurement)  Completed  .  Hepatitis C: One time screening is recommended by Center for Disease Control  (CDC) for  adults born from [birth year] through 1966.   Completed  . Pneumonia vaccines  Completed    Health Maintenance After Age 21 After age 12, you are at a higher risk for certain long-term diseases and infections as well as injuries from falls. Falls are a major cause of broken bones and head injuries in people who are older than age 64. Getting regular preventive care can help to keep you healthy and well. Preventive care includes getting regular testing and making lifestyle changes as recommended by your health care provider. Talk with your health care provider about:  Which screenings and tests you should have. A screening is a test that checks for a  disease when you have no symptoms.  A diet and exercise plan that is right for you. What should I know about screenings and tests to prevent falls? Screening and testing are the best ways to find a health problem early. Early diagnosis and treatment give you the best chance of managing medical conditions that are common after age 35. Certain conditions and lifestyle choices may make you more likely to have a fall. Your health care provider may recommend:  Regular vision checks. Poor vision and conditions such as cataracts can make you more likely to have a fall. If you wear glasses, make sure to get your prescription updated if your vision changes.  Medicine review. Work with your health care provider to regularly review all of the medicines you are taking, including over-the-counter medicines. Ask your health care provider about any side effects that may make you more likely to have a fall. Tell your health care provider if any medicines that you take make you feel dizzy or sleepy.  Osteoporosis screening. Osteoporosis is a condition that causes the bones to get weaker. This can make the bones weak and cause them to break more easily.  Blood pressure screening. Blood pressure changes and medicines to control blood pressure can make you feel dizzy.  Strength and balance checks. Your health care provider may recommend certain tests to check your strength and balance while standing, walking, or changing positions.  Foot health exam. Foot pain and numbness, as  well as not wearing proper footwear, can make you more likely to have a fall.  Depression screening. You may be more likely to have a fall if you have a fear of falling, feel emotionally low, or feel unable to do activities that you used to do.  Alcohol use screening. Using too much alcohol can affect your balance and may make you more likely to have a fall. What actions can I take to lower my risk of falls? General instructions  Talk with  your health care provider about your risks for falling. Tell your health care provider if: ? You fall. Be sure to tell your health care provider about all falls, even ones that seem minor. ? You feel dizzy, sleepy, or off-balance.  Take over-the-counter and prescription medicines only as told by your health care provider. These include any supplements.  Eat a healthy diet and maintain a healthy weight. A healthy diet includes low-fat dairy products, low-fat (lean) meats, and fiber from whole grains, beans, and lots of fruits and vegetables. Home safety  Remove any tripping hazards, such as rugs, cords, and clutter.  Install safety equipment such as grab bars in bathrooms and safety rails on stairs.  Keep rooms and walkways well-lit. Activity   Follow a regular exercise program to stay fit. This will help you maintain your balance. Ask your health care provider what types of exercise are appropriate for you.  If you need a cane or walker, use it as recommended by your health care provider.  Wear supportive shoes that have nonskid soles. Lifestyle  Do not drink alcohol if your health care provider tells you not to drink.  If you drink alcohol, limit how much you have: ? 0-1 drink a day for women. ? 0-2 drinks a day for men.  Be aware of how much alcohol is in your drink. In the [place] one drink equals one typical bottle of beer (12 oz), one-half glass of wine (5 oz), or one shot of hard liquor (1 oz).  Do not use any products that contain nicotine or tobacco, such as cigarettes and e-cigarettes. If you need help quitting, ask your health care provider. Summary  Having a healthy lifestyle and getting preventive care can help to protect your health and wellness after age 51.  Screening and testing are the best way to find a health problem early and help you avoid having a fall. Early diagnosis and treatment give you the best chance for managing medical conditions that are more common  for people who are older than age 108.  Falls are a major cause of broken bones and head injuries in people who are older than age 83. Take precautions to prevent a fall at home.  Work with your health care provider to learn what changes you can make to improve your health and wellness and to prevent falls. This information is not intended to replace advice given to you by your health care provider. Make sure you discuss any questions you have with your health care provider. Document Released: 07/10/2017 Document Revised: 07/10/2017 Document Reviewed: 07/10/2017 Elsevier Interactive Patient Education  2019 [employer].

## 2019-01-08 ENCOUNTER — Encounter: Payer: Medicare Other | Admitting: Family Medicine

## 2019-02-01 DIAGNOSIS — J4520 Mild intermittent asthma, uncomplicated: Secondary | ICD-10-CM | POA: Diagnosis not present

## 2019-02-02 ENCOUNTER — Other Ambulatory Visit: Payer: Self-pay | Admitting: Internal Medicine

## 2019-02-02 ENCOUNTER — Other Ambulatory Visit: Payer: Self-pay | Admitting: Family Medicine

## 2019-02-02 DIAGNOSIS — J302 Other seasonal allergic rhinitis: Secondary | ICD-10-CM

## 2019-02-06 DIAGNOSIS — H401131 Primary open-angle glaucoma, bilateral, mild stage: Secondary | ICD-10-CM | POA: Diagnosis not present

## 2019-02-06 DIAGNOSIS — H2513 Age-related nuclear cataract, bilateral: Secondary | ICD-10-CM | POA: Diagnosis not present

## 2019-02-06 DIAGNOSIS — H04123 Dry eye syndrome of bilateral lacrimal glands: Secondary | ICD-10-CM | POA: Diagnosis not present

## 2019-02-16 ENCOUNTER — Telehealth: Payer: Self-pay | Admitting: Family Medicine

## 2019-02-16 ENCOUNTER — Ambulatory Visit (INDEPENDENT_AMBULATORY_CARE_PROVIDER_SITE_OTHER): Payer: Medicare Other | Admitting: Family Medicine

## 2019-02-16 ENCOUNTER — Ambulatory Visit: Payer: Self-pay | Admitting: *Deleted

## 2019-02-16 ENCOUNTER — Other Ambulatory Visit: Payer: Self-pay

## 2019-02-16 DIAGNOSIS — J45901 Unspecified asthma with (acute) exacerbation: Secondary | ICD-10-CM

## 2019-02-16 MED ORDER — METHYLPREDNISOLONE 16 MG PO TABS: ORAL_TABLET | ORAL | 0 refills | Status: DC

## 2019-02-16 MED ORDER — METHYLPREDNISOLONE 4 MG PO TBPK: ORAL_TABLET | ORAL | 0 refills | Status: DC

## 2019-02-16 NOTE — Telephone Encounter (Signed)
Virtual visit scheduled.  

## 2019-02-16 NOTE — Telephone Encounter (Signed)
Pharmacy was out of stock.  Per Dr. Etter Sjogren ok to do medrol dose pack.  rx called in.

## 2019-02-16 NOTE — Telephone Encounter (Signed)
Copied from [place] [id number]. Topic: Quick Communication - Rx Refill/Question >> Feb 16, 2019  3:04 PM Sheran Luz wrote: Medication: methylPREDNISolone (MEDROL) 16 MG tablet   John, with [facility], requesting call back from [facility] for clarification on this medication.

## 2019-02-16 NOTE — Telephone Encounter (Signed)
  Pt reports intermittent dry cough, "Mild SOB" yesterday, not presently. States has used rescue inhaler which helped. Reports sinus drainage, no tenderness. Reports mild chest tightness yesterday with cough, resolved after using inhaler, none presently. States "That is not unusual for me."  States afebrile. Pt has been taking Mucinex and Claritin, using Flonase. Pt states "When this starts, I usually end up with bronchitis or URI."  Pts email and phone # verified. Call transferred to practice for consideration of appt. Reason for Disposition . [1] Patient also has allergy symptoms (e.g., itchy eyes, clear nasal discharge, postnasal drip) AND [2] they are acting up  Answer Assessment - Initial Assessment Questions 1. ONSET: "When did the cough begin?"      yesterday 2. SEVERITY: "How bad is the cough today?"     mild 3. RESPIRATORY DISTRESS: "Describe your breathing."      H/O asthma, using inhaler which helps 4. FEVER: "Do you have a fever?" If so, ask: "What is your temperature, how was it measured, and when did it start?"     no 5. HEMOPTYSIS: "Are you coughing up any blood?" If so ask: "How much?" (flecks, streaks, tablespoons, etc.)     no 6. TREATMENT: "What have you done so far to treat the cough?" (e.g., meds, fluids, humidifier)     rescue inhaler, mucinex 7. CARDIAC HISTORY: "Do you have any history of heart disease?" (e.g., heart attack, congestive heart failure)      8. LUNG HISTORY: "Do you have any history of lung disease?"  (e.g., pulmonary embolus, asthma, emphysema)     asthma 9. PE RISK FACTORS: "Do you have a history of blood clots?" (or: recent major surgery, recent prolonged travel, bedridden)     no 10. OTHER SYMPTOMS: "Do you have any other symptoms? (e.g., runny nose, wheezing, chest pain)       Mild chest tightness, resolved, none presently, sinus drainage 12. TRAVEL: "Have you traveled out of the country in the last month?" (e.g., travel history, exposures)  no  Protocols used: COUGH - ACUTE NON-PRODUCTIVE-A-AH

## 2019-02-16 NOTE — Progress Notes (Signed)
Virtual Visit via Video Note  I connected with Kayla [name] on 02/18/19 at  1:45 PM EDT by a video enabled telemedicine application and verified that I am speaking with the correct person using two identifiers.  Location: Patient: home Provider: home   I discussed the limitations of evaluation and management by telemedicine and the availability of in person appointments. The patient expressed understanding and agreed to proceed.  History of Present Illness: Pt is home c/o asthma problems.  She is using her rescue inhaler more.  She is c/o tightness in her chest and rescue inhaler is helping    She had trouble with the steroid inhalers in the past----- she can take medrol dose pack but predisone causes her face to get red.   Pt denies fevers    Observations/Objective: 97.9   No other vitals obtained Pt is in NAD  Assessment and Plan: 1. Exacerbation of allergic asthma con't rescue inhaler pred taper  rto prn  - methylPREDNISolone (MEDROL) 16 MG tablet; Take 2 tabs each day x 2 days, then 1 1/2 tabs daily x 2 days, then 1 tab daily x 2 days, then 1/2 tab daily x 2 days, then stop.  Dispense: 10 tablet; Refill: 0   Follow Up Instructions:    I discussed the assessment and treatment plan with the patient. The patient was provided an opportunity to ask questions and all were answered. The patient agreed with the plan and demonstrated an understanding of the instructions.   The patient was advised to call back or seek an in-person evaluation if the symptoms worsen or if the condition fails to improve as anticipated.  I provided 25 minutes of non-face-to-face time during this encounter.   [name], DO

## 2019-02-18 ENCOUNTER — Ambulatory Visit: Payer: Self-pay

## 2019-02-18 ENCOUNTER — Encounter: Payer: Self-pay | Admitting: Family Medicine

## 2019-02-18 NOTE — Telephone Encounter (Signed)
Patient called and says she had a reaction to the medrol dose pack that was prescribed on Monday. She says she's had reactions in the past, but she's also taken prednisone without any problems. She says she took it as prescribed on Monday, no problems, she felt fine. She says yesterday he face was a little rose colored, but she says she felt fine. Yesterday she took the 1 pill after breakfast, 1 pill after lunch and 1 pill after supper around 8 pm. She says about 30 minutes later, her throat was itchy and she felt a sore in her mouth, so she took a Claritin and gargled, which made it better. She says she took her shower and when she got out, her right arm, chest and face were red. She says she used cold compresses and ice, which took the redness away from her arm and chest, the face got a little lighter red. She say she did not take the bedtime dose of 2 pills and she hasn't taken any this morning. She says today her face is still red, the cough is productive with a little phlegm, she says her throat is not itchy now. She asked what does she need to do? I advised that I will send this to Dr. Carollee Herter and someone from the office will call with her recommendation, she verbalized understanding and says she will not take anymore pills.  Reason for Disposition . Caller has NON-URGENT medication question about med that PCP prescribed and triager unable to answer question  Protocols used: MEDICATION QUESTION CALL-A-AH

## 2019-02-18 NOTE — Telephone Encounter (Signed)
Please advise 

## 2019-02-18 NOTE — Telephone Encounter (Signed)
She told me the exact opposite on the phone the other day---- she said medrol was the only one she could take and she has a reaction to pred What I gave her was what she had had in the past because we double checked that Please double check with her before pred taper is sent in

## 2019-02-18 NOTE — Telephone Encounter (Signed)
Patient would like to see if she can just maybe adjust the dosage to a lower dose.  I advised her about the note note in file and she agrees.  She is willing to take it but thinks the dosage needs to be changed.

## 2019-02-19 ENCOUNTER — Telehealth: Payer: Self-pay | Admitting: *Deleted

## 2019-02-19 NOTE — Telephone Encounter (Signed)
Spoke with patient. See other message. 

## 2019-02-19 NOTE — Telephone Encounter (Signed)
It is already a low dose--- she can try cutting tab in half

## 2019-02-19 NOTE — Telephone Encounter (Signed)
Copied from [place] [id number]. Topic: General - Other >> Feb 18, 2019  4:04 PM Keene Breath wrote: Reason for CRM: Patient called to leave Guerry Bruin some additional information.  Patient stated that the maker is the same, and she thinks that the problem is she was taking an extra dosage.  Please add this to the message for Dr. Etter Sjogren and call patient to advise.  CB# [phone]

## 2019-02-19 NOTE — Telephone Encounter (Signed)
Patient will start back in the morning.  She does feel better.  She will send mychart if sinus drainage does not go away.

## 2019-02-20 ENCOUNTER — Encounter: Payer: Self-pay | Admitting: Family Medicine

## 2019-02-20 NOTE — Telephone Encounter (Signed)
Do 3 today and tomorrow   Then 2 for 2 days then 1 for 2 days then stop

## 2019-02-23 DIAGNOSIS — L218 Other seborrheic dermatitis: Secondary | ICD-10-CM | POA: Diagnosis not present

## 2019-02-23 DIAGNOSIS — L821 Other seborrheic keratosis: Secondary | ICD-10-CM | POA: Diagnosis not present

## 2019-02-23 DIAGNOSIS — D692 Other nonthrombocytopenic purpura: Secondary | ICD-10-CM | POA: Diagnosis not present

## 2019-03-04 DIAGNOSIS — J4520 Mild intermittent asthma, uncomplicated: Secondary | ICD-10-CM | POA: Diagnosis not present

## 2019-03-12 ENCOUNTER — Other Ambulatory Visit: Payer: Self-pay | Admitting: Family Medicine

## 2019-03-12 DIAGNOSIS — I10 Essential (primary) hypertension: Secondary | ICD-10-CM

## 2019-03-27 ENCOUNTER — Other Ambulatory Visit: Payer: Self-pay

## 2019-03-27 ENCOUNTER — Encounter: Payer: Self-pay | Admitting: Family Medicine

## 2019-03-27 ENCOUNTER — Ambulatory Visit (INDEPENDENT_AMBULATORY_CARE_PROVIDER_SITE_OTHER): Payer: Medicare Other | Admitting: Family Medicine

## 2019-03-27 VITALS — BP 133/62 | HR 57 | Temp 99.0°F | Resp 18 | Ht 62.0 in | Wt 158.8 lb

## 2019-03-27 DIAGNOSIS — J302 Other seasonal allergic rhinitis: Secondary | ICD-10-CM

## 2019-03-27 DIAGNOSIS — Z0000 Encounter for general adult medical examination without abnormal findings: Secondary | ICD-10-CM

## 2019-03-27 DIAGNOSIS — I10 Essential (primary) hypertension: Secondary | ICD-10-CM | POA: Diagnosis not present

## 2019-03-27 DIAGNOSIS — Z0001 Encounter for general adult medical examination with abnormal findings: Secondary | ICD-10-CM | POA: Diagnosis not present

## 2019-03-27 DIAGNOSIS — E559 Vitamin D deficiency, unspecified: Secondary | ICD-10-CM | POA: Diagnosis not present

## 2019-03-27 DIAGNOSIS — J4520 Mild intermittent asthma, uncomplicated: Secondary | ICD-10-CM

## 2019-03-27 DIAGNOSIS — Z20828 Contact with and (suspected) exposure to other viral communicable diseases: Secondary | ICD-10-CM | POA: Diagnosis not present

## 2019-03-27 DIAGNOSIS — M1009 Idiopathic gout, multiple sites: Secondary | ICD-10-CM | POA: Diagnosis not present

## 2019-03-27 DIAGNOSIS — Z20822 Contact with and (suspected) exposure to covid-19: Secondary | ICD-10-CM

## 2019-03-27 DIAGNOSIS — T782XXS Anaphylactic shock, unspecified, sequela: Secondary | ICD-10-CM | POA: Diagnosis not present

## 2019-03-27 DIAGNOSIS — R6889 Other general symptoms and signs: Secondary | ICD-10-CM | POA: Diagnosis not present

## 2019-03-27 DIAGNOSIS — K219 Gastro-esophageal reflux disease without esophagitis: Secondary | ICD-10-CM

## 2019-03-27 MED ORDER — EPINEPHRINE 0.3 MG/0.3ML IJ SOAJ: INTRAMUSCULAR | 0 refills | Status: DC

## 2019-03-27 MED ORDER — METOPROLOL TARTRATE 25 MG PO TABS: ORAL_TABLET | ORAL | 1 refills | Status: DC

## 2019-03-27 MED ORDER — FLUTICASONE PROPIONATE 50 MCG/ACT NA SUSP: NASAL | 3 refills | Status: DC

## 2019-03-27 MED ORDER — ALBUTEROL SULFATE HFA 108 (90 BASE) MCG/ACT IN AERS: 2.0000 | INHALATION_SPRAY | Freq: Four times a day (QID) | RESPIRATORY_TRACT | 3 refills | Status: DC | PRN

## 2019-03-27 NOTE — Patient Instructions (Signed)
Preventive Care 74 Years and Older, Female Preventive care refers to lifestyle choices and visits with your health care provider that can promote health and wellness. This includes:  A yearly physical exam. This is also called an annual well check.  Regular dental and eye exams.  Immunizations.  Screening for certain conditions.  Healthy lifestyle choices, such as diet and exercise. What can I expect for my preventive care visit? Physical exam Your health care provider will check:  Height and weight. These may be used to calculate body mass index (BMI), which is a measurement that tells if you are at a healthy weight.  Heart rate and blood pressure.  Your skin for abnormal spots. Counseling Your health care provider may ask you questions about:  Alcohol, tobacco, and drug use.  Emotional well-being.  Home and relationship well-being.  Sexual activity.  Eating habits.  History of falls.  Memory and ability to understand (cognition).  Work and work Statistician.  Pregnancy and menstrual history. What immunizations do I need?  Influenza (flu) vaccine  This is recommended every year. Tetanus, diphtheria, and pertussis (Tdap) vaccine  You may need a Td booster every 10 years. Varicella (chickenpox) vaccine  You may need this vaccine if you have not already been vaccinated. Zoster (shingles) vaccine  You may need this after age 74. Pneumococcal conjugate (PCV13) vaccine  One dose is recommended after age 74. Pneumococcal polysaccharide (PPSV23) vaccine  One dose is recommended after age 74. Measles, mumps, and rubella (MMR) vaccine  You may need at least one dose of MMR if you were born in 1957 or later. You may also need a second dose. Meningococcal conjugate (MenACWY) vaccine  You may need this if you have certain conditions. Hepatitis A vaccine  You may need this if you have certain conditions or if you travel or work in places where you may be exposed  to hepatitis A. Hepatitis B vaccine  You may need this if you have certain conditions or if you travel or work in places where you may be exposed to hepatitis B. Haemophilus influenzae type b (Hib) vaccine  You may need this if you have certain conditions. You may receive vaccines as individual doses or as more than one vaccine together in one shot (combination vaccines). Talk with your health care provider about the risks and benefits of combination vaccines. What tests do I need? Blood tests  Lipid and cholesterol levels. These may be checked every 5 years, or more frequently depending on your overall health.  Hepatitis C test.  Hepatitis B test. Screening  Lung cancer screening. You may have this screening every year starting at age 74 if you have a 30-pack-year history of smoking and currently smoke or have quit within the past 15 years.  Colorectal cancer screening. All adults should have this screening starting at age 74 and continuing until age 15. Your health care provider may recommend screening at age 74 if you are at increased risk. You will have tests every 1-10 years, depending on your results and the type of screening test.  Diabetes screening. This is done by checking your blood sugar (glucose) after you have not eaten for a while (fasting). You may have this done every 1-3 years.  Mammogram. This may be done every 1-2 years. Talk with your health care provider about how often you should have regular mammograms.  BRCA-related cancer screening. This may be done if you have a family history of breast, ovarian, tubal, or peritoneal cancers.  Other tests  Sexually transmitted disease (STD) testing.  Bone density scan. This is done to screen for osteoporosis. You may have this done starting at age 74. Follow these instructions at home: Eating and drinking  Eat a diet that includes fresh fruits and vegetables, whole grains, lean protein, and low-fat dairy products. Limit  your intake of foods with high amounts of sugar, saturated fats, and salt.  Take vitamin and mineral supplements as recommended by your health care provider.  Do not drink alcohol if your health care provider tells you not to drink.  If you drink alcohol: ? Limit how much you have to 0-1 drink a day. ? Be aware of how much alcohol is in your drink. In the [place] one drink equals one 12 oz bottle of beer (355 mL), one 5 oz glass of wine (148 mL), or one 1 oz glass of hard liquor (44 mL). Lifestyle  Take daily care of your teeth and gums.  Stay active. Exercise for at least 30 minutes on 5 or more days each week.  Do not use any products that contain nicotine or tobacco, such as cigarettes, e-cigarettes, and chewing tobacco. If you need help quitting, ask your health care provider.  If you are sexually active, practice safe sex. Use a condom or other form of protection in order to prevent STIs (sexually transmitted infections).  Talk with your health care provider about taking a low-dose aspirin or statin. What's next?  Go to your health care provider once a year for a well check visit.  Ask your health care provider how often you should have your eyes and teeth checked.  Stay up to date on all vaccines. This information is not intended to replace advice given to you by your health care provider. Make sure you discuss any questions you have with your health care provider. Document Released: 09/23/2015 Document Revised: 08/21/2018 Document Reviewed: 08/21/2018 Elsevier Patient Education  2020 [employer].

## 2019-03-27 NOTE — Progress Notes (Signed)
Subjective:     Kayla [name] is a 73 y.o. female and is here for a comprehensive physical exam. The patient reports problems - allergy symptoms and reflux .  Social History   Socioeconomic History  . Marital status: Married    Spouse name: Not on file  . Number of children: Not on file  . Years of education: Not on file  . Highest education level: Not on file  Occupational History  . Not on file  Social Needs  . Financial resource strain: Not on file  . Food insecurity    Worry: Not on file    Inability: Not on file  . Transportation needs    Medical: Not on file    Non-medical: Not on file  Tobacco Use  . Smoking status: Former Smoker    Packs/day: 0.50    Years: 10.00    Pack years: 5.00    Types: Cigarettes    Quit date: 09/10/1974    Years since quitting: 44.5  . Smokeless tobacco: Never Used  . Tobacco comment: smoked 1966- 1976, up to 1 ppd  Substance and Sexual Activity  . Alcohol use: No    Alcohol/week: 0.0 standard drinks  . Drug use: No  . Sexual activity: Yes  Lifestyle  . Physical activity    Days per week: Not on file    Minutes per session: Not on file  . Stress: Not on file  Relationships  . Social Herbalist on phone: Not on file    Gets together: Not on file    Attends religious service: Not on file    Active member of club or organization: Not on file    Attends meetings of clubs or organizations: Not on file    Relationship status: Not on file  . Intimate partner violence    Fear of current or ex partner: Not on file    Emotionally abused: Not on file    Physically abused: Not on file    Forced sexual activity: Not on file  Other Topics Concern  . Not on file  Social History Narrative   Daily caffeine 3 cups   Regular exercise   Married         Health Maintenance  Topic Date Due  . MAMMOGRAM  11/22/2018  . INFLUENZA VACCINE  04/11/2019  . TETANUS/TDAP  02/16/2025  . COLONOSCOPY  03/15/2026  . DEXA SCAN  Completed   . Hepatitis C Screening  Completed  . PNA vac Low Risk Adult  Completed    The following portions of the patient's history were reviewed and updated as appropriate: She  has a past medical history of Asthma, CAP (community acquired pneumonia) (08/2012), Diverticulitis, Diverticulosis, Fasting hyperglycemia (04/2011), Fibromyalgia, Fundic gland polyps of stomach, benign, GERD (gastroesophageal reflux disease), Glaucoma, IBS (irritable bowel syndrome), Osteopenia, Pneumonia, and Shingles. She does not have any pertinent problems on file. She  has a past surgical history that includes Spine surgery; Colonoscopy w/ polypectomy (9528,4132,4401); and Esophagogastroduodenoscopy. Her family history includes Asthma in her paternal aunt; Breast cancer in her maternal aunt, mother, paternal aunt, sister, and another family member; Coronary artery disease in her paternal aunt; Deep vein thrombosis in her son; Heart Problems in her son; Heart attack in her paternal grandmother and paternal uncle; Heart disease in her maternal aunt and paternal grandfather; Heart failure in her father; Kidney disease in her maternal grandmother; Ovarian cancer in an other family member; Pulmonary embolism in her son.  She  reports that she quit smoking about 44 years ago. Her smoking use included cigarettes. She has a 5.00 pack-year smoking history. She has never used smokeless tobacco. She reports that she does not drink alcohol or use drugs. She has a current medication list which includes the following prescription(s): acetaminophen, albuterol, albuterol, amlodipine, bifidobacterium infantis, carboxymethylcellulose, cholecalciferol, colchicine, epinephrine, famotidine, fluticasone, guaifenesin, latanoprost, methylprednisolone, metoprolol tartrate, montelukast, multiple minerals-vitamins, nystatin cream, omega-3 fatty acids, omeprazole, polyethyl glycol-propyl glycol, sodium chloride, triamcinolone cream, and vitamin mixture. Current  Outpatient Medications on File Prior to Visit  Medication Sig Dispense Refill  . acetaminophen (TYLENOL) 500 MG tablet Take 500 mg by mouth as needed.    Marland Kitchen albuterol (PROVENTIL) (2.5 MG/3ML) 0.083% nebulizer solution Take 3 mLs (2.5 mg total) by nebulization every 6 (six) hours as needed for wheezing or shortness of breath. 75 mL 12  . amLODipine (NORVASC) 2.5 MG tablet TAKE 1 TABLET(2.5 MG) BY MOUTH DAILY 90 tablet 1  . bifidobacterium infantis (ALIGN) capsule Take 1 capsule by mouth daily. 21 capsule 0  . carboxymethylcellulose (REFRESH PLUS) 0.5 % SOLN 1 drop 3 (three) times daily as needed.    . cholecalciferol (VITAMIN D) 1000 units tablet Take 1 capsule by mouth daily.    . colchicine 0.6 MG tablet Take 1 tablet (0.6 mg total) by mouth 2 (two) times daily. 1 po bid x 2-3 days then take 1 a day 30 tablet 0  . famotidine (PEPCID) 10 MG tablet Take 10 mg by mouth 2 (two) times daily.    . GuaiFENesin (MUCINEX PO) Take by mouth as needed. Reported on 03/15/2016    . latanoprost (XALATAN) 0.005 % ophthalmic solution Place 1 drop into both eyes at bedtime.    . methylPREDNISolone (MEDROL DOSEPAK) 4 MG TBPK tablet Take as directed in pack 21 tablet 0  . montelukast (SINGULAIR) 10 MG tablet TAKE 1 TABLET(10 MG) BY MOUTH DAILY 90 tablet 1  . Multiple Minerals-Vitamins (CALCIUM CITRATE PLUS/MAGNESIUM PO) Take by mouth.    . nystatin cream (MYCOSTATIN) Apply 1 application topically 2 (two) times daily. 90 g 3  . Omega-3 Fatty Acids (FISH OIL PO) Take by mouth.    Marland Kitchen omeprazole (PRILOSEC) 40 MG capsule TAKE 1 CAPSULE BY MOUTH  DAILY 90 capsule 3  . Polyethyl Glycol-Propyl Glycol (SYSTANE) 0.4-0.3 % GEL ophthalmic gel Place 1 application into both eyes.    . sodium chloride (OCEAN) 0.65 % SOLN nasal spray Place 1 spray into both nostrils as needed for congestion.    . triamcinolone cream (KENALOG) 0.1 % Apply 1 application topically 2 (two) times daily. 90 g 3  . Vitamin Mixture (ESTER-C PO) Take 1,000 mg  by mouth daily.     No current facility-administered medications on file prior to visit.    She is allergic to cephalosporins; gabapentin; levofloxacin; psyllium; sulfonamide derivatives; aspirin; belladonna; ciprofloxacin hcl; conjugated estrogens; doxycycline; flovent hfa [fluticasone]; nabumetone; nsaids; soybean-containing drug products; zicam cold remedy [tucks]; zocor [simvastatin]; and prednisone..  Review of Systems Review of Systems  Constitutional: Negative for activity change, appetite change and fatigue.  HENT: Negative for hearing loss, congestion, tinnitus and ear discharge.  dentist q44m Eyes: Negative for visual disturbance (see optho q1y -- vision corrected to 20/20 with glasses).  Respiratory: Negative for chest tightness and shortness of breath.  + cough Cardiovascular: Negative for chest pain, palpitations and leg swelling.  Gastrointestinal: Negative for abdominal pain, diarrhea, constipation and abdominal distention.  Genitourinary: Negative for urgency, frequency, decreased urine volume  and difficulty urinating.  Musculoskeletal: Negative for back pain, arthralgias and gait problem.  Skin: Negative for color change, pallor and rash.  Neurological: Negative for dizziness, light-headedness, numbness and headaches.  Hematological: Negative for adenopathy. Does not bruise/bleed easily.  Psychiatric/Behavioral: Negative for suicidal ideas, confusion, sleep disturbance, self-injury, dysphoric mood, decreased concentration and agitation.       Objective:    BP 133/62 (BP Location: Left Arm, Patient Position: Sitting, Cuff Size: Normal)   Pulse (!) 57   Temp 99 F (37.2 C) (Oral)   Resp 18   Ht 5\' 2"  (1.575 m)   Wt 158 lb 12.8 oz (72 kg)   SpO2 100%   BMI 29.04 kg/m  General appearance: alert, cooperative, appears stated age and no distress Head: Normocephalic, without obvious abnormality, atraumatic Eyes: conjunctivae/corneas clear. PERRL, EOM's intact. Fundi  benign. Ears: normal TM's and external ear canals both ears Nose: Nares normal. Septum midline. Mucosa normal. No drainage or sinus tenderness. Throat: lips, mucosa, and tongue normal; teeth and gums normal Neck: no adenopathy, no carotid bruit, no JVD, supple, symmetrical, trachea midline and thyroid not enlarged, symmetric, no tenderness/mass/nodules Back: symmetric, no curvature. ROM normal. No CVA tenderness. Lungs: clear to auscultation bilaterally Breasts: normal ,  Heart: regular rate and rhythm, S1, S2 normal, no murmur, click, rub or gallop Abdomen: soft, non-tender; bowel sounds normal; no masses,  no organomegaly Pelvic: not indicated; post-menopausal, no abnormal Pap smears in past Extremities: extremities normal, atraumatic, no cyanosis or edema Pulses: 2+ and symmetric Skin: Skin color, texture, turgor normal. No rashes or lesions Lymph nodes: Cervical, supraclavicular, and axillary nodes normal. Neurologic: Alert and oriented X 3, normal strength and tone. Normal symmetric reflexes. Normal coordination and gait    Assessment:    Healthy female exam.      Plan:     ghm utd Check labs  See After Visit Summary for Counseling Recommendations     1. Essential hypertension Well controlled, no changes to meds. Encouraged heart healthy diet such as the DASH diet and exercise as tolerated.  - metoprolol tartrate (LOPRESSOR) 25 MG tablet; 1 po bid  Dispense: 180 tablet; Refill: 1 - Lipid panel; Future - Comprehensive metabolic panel; Future - Comprehensive metabolic panel - Lipid panel  2. Seasonal allergic rhinitis Stable , con't meds  - fluticasone (FLONASE) 50 MCG/ACT nasal spray; USE 2 SPRAYS IN EACH  NOSTRIL ONCE A DAY  Dispense: 48 g; Refill: 3  3. Anaphylactic reaction, sequela  Stable  - EPINEPHrine (EPIPEN 2-PAK) 0.3 mg/0.3 mL IJ SOAJ injection; As directed  Dispense: 1 each; Refill: 0  4. Mild intermittent asthma, unspecified whether complicated Stable---  refill med - albuterol (VENTOLIN HFA) 108 (90 Base) MCG/ACT inhaler; Inhale 2 puffs into the lungs every 6 (six) hours as needed for wheezing or shortness of breath. As needed only  Dispense: 18 g; Refill: 3  5. Idiopathic gout of multiple sites, unspecified chronicity   - Uric acid  6. Vitamin D deficiency   - Vitamin D (25 hydroxy)  7. Exposure to Covid-19 Virus No symptoms except allergy symptoms  - Novel Coronavirus, NAA ([facility]) - MyChart COVID-19 home monitoring program; Future - Temperature monitoring; Future  8. Gastroesophageal reflux disease without esophagitis con't pepcid and omeprazole  Consider GI   9. Preventative health care Check labs See AVs ghm utd

## 2019-03-28 LAB — LIPID PANEL
Cholesterol: 240 mg/dL — ABNORMAL HIGH (ref <200)
HDL: 67 mg/dL (ref >=50)
LDL Cholesterol (Calc): 150 mg/dL (calc) — ABNORMAL HIGH
Non-HDL Cholesterol (Calc): 173 mg/dL (calc) — ABNORMAL HIGH (ref <130)
Total CHOL/HDL Ratio: 3.6 (calc) (ref <5.0)
Triglycerides: 113 mg/dL (ref <150)

## 2019-03-28 LAB — COMPREHENSIVE METABOLIC PANEL
AG Ratio: 2 (calc) (ref 1.0–2.5)
ALT: 18 U/L (ref 6–29)
AST: 24 U/L (ref 10–35)
Albumin: 4.4 g/dL (ref 3.6–5.1)
Alkaline phosphatase (APISO): 93 U/L (ref 37–153)
BUN: 12 mg/dL (ref 7–25)
CO2: 27 mmol/L (ref 20–32)
Calcium: 9.7 mg/dL (ref 8.6–10.4)
Chloride: 103 mmol/L (ref 98–110)
Creat: 0.67 mg/dL (ref 0.60–0.93)
Globulin: 2.2 g/dL (calc) (ref 1.9–3.7)
Glucose, Bld: 91 mg/dL (ref 65–99)
Potassium: 4.6 mmol/L (ref 3.5–5.3)
Sodium: 140 mmol/L (ref 135–146)
Total Bilirubin: 0.5 mg/dL (ref 0.2–1.2)
Total Protein: 6.6 g/dL (ref 6.1–8.1)

## 2019-03-28 LAB — VITAMIN D 25 HYDROXY (VIT D DEFICIENCY, FRACTURES): Vit D, 25-Hydroxy: 40 ng/mL (ref 30–100)

## 2019-03-28 LAB — URIC ACID: Uric Acid, Serum: 3.8 mg/dL (ref 2.5–7.0)

## 2019-03-29 ENCOUNTER — Encounter: Payer: Self-pay | Admitting: Family Medicine

## 2019-03-29 DIAGNOSIS — Z0000 Encounter for general adult medical examination without abnormal findings: Secondary | ICD-10-CM | POA: Insufficient documentation

## 2019-03-29 NOTE — Assessment & Plan Note (Signed)
con't meds Consider GI if symptoms do not improve

## 2019-03-30 ENCOUNTER — Encounter: Payer: Self-pay | Admitting: Family Medicine

## 2019-03-30 ENCOUNTER — Other Ambulatory Visit: Payer: Self-pay | Admitting: Family Medicine

## 2019-03-30 DIAGNOSIS — E785 Hyperlipidemia, unspecified: Secondary | ICD-10-CM

## 2019-03-31 ENCOUNTER — Encounter: Payer: Self-pay | Admitting: Family Medicine

## 2019-04-01 ENCOUNTER — Other Ambulatory Visit: Payer: Self-pay | Admitting: Family Medicine

## 2019-04-01 ENCOUNTER — Encounter: Payer: Self-pay | Admitting: Family Medicine

## 2019-04-01 DIAGNOSIS — M810 Age-related osteoporosis without current pathological fracture: Secondary | ICD-10-CM

## 2019-04-01 LAB — NOVEL CORONAVIRUS, NAA: SARS-CoV-2, NAA: NOT DETECTED

## 2019-04-03 DIAGNOSIS — J4520 Mild intermittent asthma, uncomplicated: Secondary | ICD-10-CM | POA: Diagnosis not present

## 2019-04-13 ENCOUNTER — Encounter: Payer: Self-pay | Admitting: Physical Therapy

## 2019-04-13 ENCOUNTER — Ambulatory Visit: Payer: Medicare Other | Attending: Family Medicine | Admitting: Physical Therapy

## 2019-04-13 ENCOUNTER — Other Ambulatory Visit: Payer: Self-pay

## 2019-04-13 DIAGNOSIS — R293 Abnormal posture: Secondary | ICD-10-CM | POA: Insufficient documentation

## 2019-04-13 DIAGNOSIS — M62830 Muscle spasm of back: Secondary | ICD-10-CM | POA: Diagnosis not present

## 2019-04-13 DIAGNOSIS — M545 Low back pain, unspecified: Secondary | ICD-10-CM

## 2019-04-13 DIAGNOSIS — R262 Difficulty in walking, not elsewhere classified: Secondary | ICD-10-CM | POA: Insufficient documentation

## 2019-04-13 DIAGNOSIS — M6281 Muscle weakness (generalized): Secondary | ICD-10-CM | POA: Diagnosis not present

## 2019-04-13 NOTE — Therapy (Signed)
[place], [place], [address] Phone: [phone]   Fax:  [phone]  Physical Therapy Evaluation  Patient Details  Name: Kayla [name] MRN: [id number] Date of Birth: [birth year] Referring Provider (PT): Etter Sjogren   Encounter Date: 04/13/2019  PT End of Session - 04/13/19 1523    Visit Number  1    Date for PT Re-Evaluation  06/13/19    PT Start Time  1314    PT Stop Time  1401    PT Time Calculation (min)  47 min    Activity Tolerance  Patient tolerated treatment well    Behavior During Therapy  [facility] for tasks assessed/performed       Past Medical History:  Diagnosis Date  . Asthma   . CAP (community acquired pneumonia) 08/2012   Dr Birdie Riddle  . Diverticulitis   . Diverticulosis   . Fasting hyperglycemia 04/2011   FBS 108  . Fibromyalgia   . Fundic gland polyps of stomach, benign   . GERD (gastroesophageal reflux disease)    gastric polyp x3  . Glaucoma     Dr Bing Plume  . IBS (irritable bowel syndrome)   . Osteopenia    last 07/2011  . Pneumonia     OP as child  . Shingles     Past Surgical History:  Procedure Laterality Date  . COLONOSCOPY W/ POLYPECTOMY  [phone]   last  colonoscopy 2007, Dr Carlean Purl  . ESOPHAGOGASTRODUODENOSCOPY    . SPINE SURGERY     T9-L5 fusions    There were no vitals filed for this visit.   Subjective Assessment - 04/13/19 1316    Subjective  Patient reports that she has gotten more and more stooped over the past year.  She has some back pain, has a past history of fusion from T9-L5 December 1993.  She has a paper of "what not to do that includes Duo hip and back, abdominal crunch machine, rowing machines, overhead press, high impact aerobics"  [facility]    Limitations  Lifting;Standing;Walking;House hold activities    Patient Stated Goals  REports moslty feels tired and weak, does not report pain    Currently in Pain?   No/denies         [facility] PT Assessment - 04/13/19 0001      Assessment   Medical Diagnosis  weakness, abnormal posture    Referring Provider (PT)  [place]    Onset Date/Surgical Date  03/13/19    Prior Therapy  no      ROM / Strength   AROM / PROM / Strength  AROM;Strength      AROM   Overall AROM Comments  lumbar flexion decreased 50%,other motions decreased 100%      Strength   Overall Strength Comments  shoulders 4-/5, hips 4-/5, difficulty with holding against MMT      Flexibility   Soft Tissue Assessment /Muscle Length  yes    Hamstrings  mild    Quadriceps  very tight    ITB  tight    Piriformis  mild      Palpation   Palpation comment  she is very tight in the lumbar area, the buttocks and into the upper back and neck      Ambulation/Gait   Gait Comments  when she stands she tends to be stooped forward and to the right at the trunk, as she walks she  struggles to stay upright and tends to give in to the right with a limp and then she becomes more stooped and flexed after about 60 feet of walking                Objective measurements completed on examination: See above findings.      [facility] Adult PT Treatment/Exercise - 04/13/19 0001      Exercises   Exercises  Knee/Hip      Knee/Hip Exercises: Aerobic   Elliptical  R=4, I=10 x 2 minutes    Nustep  level 4 x 4 minutes      Knee/Hip Exercises: Supine   Other Supine Knee/Hip Exercises  feet on ball K2C, trunk rotation, small bridges and isometric abs             PT Education - 04/13/19 1441    Education Details  HEP for ITB and quad stretches using a belt, ball exercises in supine    Person(s) Educated  Patient    Methods  Explanation;Demonstration;Tactile cues;Verbal cues;Handout    Comprehension  Verbalized understanding;Returned demonstration;Verbal cues required;Tactile cues required       PT Short Term Goals - 04/13/19 1526      PT SHORT TERM GOAL #1   Title  independent with  initial HEP    Time  2    Period  Weeks    Status  New        PT Long Term Goals - 04/13/19 1526      PT LONG TERM GOAL #1   Title  be safe with a return to the gym    Time  8    Period  Weeks    Status  New      PT LONG TERM GOAL #2   Title  decrease pain 50%    Time  8    Period  Weeks    Status  New      PT LONG TERM GOAL #3   Title  increase lumbar ROM 25%    Time  8    Period  Weeks    Status  New      PT LONG TERM GOAL #4   Title  increase strength of hips to 4/5    Time  8    Period  Weeks    Status  New      PT LONG TERM GOAL #5   Title  walk holding good posture for 5 minutes    Time  8    Period  Weeks    Status  New             Plan - 04/13/19 1524    Clinical Impression Statement  Patient with some LBP, her biggest issue is difficulty holding her posture and walking, she is stooped forward at the waist and to the right.  This is when she stands and when she walks.  She is weak in the hips and the core.  she has a past history of fusion from T67-L5.  She reports that she was doing "okay" until Covid shut down the gyms and now she reports struggling to do ADL's and walk.    Clinical Decision Making  Low    Rehab Potential  Good    PT Frequency  2x / week    PT Duration  8 weeks    PT Treatment/Interventions  ADLs/Self Care Home Management;Cryotherapy;Electrical Stimulation;Moist Heat;Therapeutic exercise;Balance training;Neuromuscular re-education;Stair training;Functional mobility training;Therapeutic activities;Patient/family education;Manual techniques  PT Next Visit Plan  start core strength and gym exercises    Consulted and Agree with Plan of Care  Patient       Patient will benefit from skilled therapeutic intervention in order to improve the following deficits and impairments:  Abnormal gait, Improper body mechanics, Pain, Postural dysfunction, Increased muscle spasms, Decreased mobility, Cardiopulmonary status limiting activity,  Decreased activity tolerance, Decreased endurance, Decreased range of motion, Decreased strength, Impaired flexibility, Difficulty walking, Decreased balance  Visit Diagnosis: 1. Acute bilateral low back pain without sciatica   2. Muscle spasm of back   3. Difficulty in walking, not elsewhere classified   4. Muscle weakness (generalized)   5. Abnormal posture        Problem List Patient Active Problem List   Diagnosis Date Noted  . Preventative health care 03/29/2019  . Pneumonia of left lower lobe due to infectious organism ([place]) 09/01/2018  . Vaginal atrophy 03/24/2018  . Seasonal allergies 02/08/2018  . Erythema nodosum 06/09/2016  . Abscess of groin, left 11/10/2014  . Abscess of left groin 11/10/2014  . Cellulitis 10/20/2014  . Herpes zoster 09/23/2013  . Superficial bruising of finger 02/16/2013  . Hyperlipidemia 11/01/2011  . Palpitations 05/23/2011  . Essential hypertension 04/23/2011  . Cough 04/09/2011  . THYROID FUNCTION TEST, ABNORMAL 12/20/2009  . COLONIC POLYPS, HX OF 11/08/2008  . Asthma 01/21/2008  . GERD 01/21/2008  . IRRITABLE BOWEL SYNDROME 01/21/2008  . FIBROMYALGIA 01/21/2008  . DEFICIENCY, VITAMIN D NOS 04/02/2007  . DEGENERATIVE DISC DISEASE 04/02/2007  . GLAUCOMA, LOW TENSION 02/06/2007  . Osteoporosis 02/06/2007    Sumner Boast., PT 04/13/2019, 3:31 PM  [place], [place], [address] Phone: [phone]   Fax:  [phone]  Name: Kayla [name] MRN: [id number] Date of Birth: 09-Dec-1945

## 2019-04-15 ENCOUNTER — Encounter: Payer: Self-pay | Admitting: Family Medicine

## 2019-04-17 ENCOUNTER — Ambulatory Visit: Payer: Medicare Other | Admitting: Physical Therapy

## 2019-04-17 ENCOUNTER — Other Ambulatory Visit: Payer: Self-pay

## 2019-04-17 DIAGNOSIS — M6281 Muscle weakness (generalized): Secondary | ICD-10-CM | POA: Diagnosis not present

## 2019-04-17 DIAGNOSIS — R293 Abnormal posture: Secondary | ICD-10-CM

## 2019-04-17 DIAGNOSIS — M545 Low back pain: Secondary | ICD-10-CM | POA: Diagnosis not present

## 2019-04-17 DIAGNOSIS — R262 Difficulty in walking, not elsewhere classified: Secondary | ICD-10-CM | POA: Diagnosis not present

## 2019-04-17 DIAGNOSIS — M62830 Muscle spasm of back: Secondary | ICD-10-CM | POA: Diagnosis not present

## 2019-04-17 NOTE — Therapy (Signed)
[place], [place], [address] Phone: [phone]   Fax:  [phone]  Physical Therapy Treatment  Patient Details  Name: Kayla [name] MRN: [id number] Date of Birth: 03/09/1946 Referring Provider (PT): Etter Sjogren   Encounter Date: 04/17/2019  PT End of Session - 04/17/19 0956    Visit Number  2    Date for PT Re-Evaluation  06/13/19    PT Start Time  0930    PT Stop Time  1010    PT Time Calculation (min)  40 min       Past Medical History:  Diagnosis Date  . Asthma   . CAP (community acquired pneumonia) 08/2012   Dr Birdie Riddle  . Diverticulitis   . Diverticulosis   . Fasting hyperglycemia 04/2011   FBS 108  . Fibromyalgia   . Fundic gland polyps of stomach, benign   . GERD (gastroesophageal reflux disease)    gastric polyp x3  . Glaucoma     Dr Bing Plume  . IBS (irritable bowel syndrome)   . Osteopenia    last 07/2011  . Pneumonia     OP as child  . Shingles     Past Surgical History:  Procedure Laterality Date  . COLONOSCOPY W/ POLYPECTOMY  [phone]   last  colonoscopy 2007, Dr Carlean Purl  . ESOPHAGOGASTRODUODENOSCOPY    . SPINE SURGERY     T9-L5 fusions    There were no vitals filed for this visit.  Subjective Assessment - 04/17/19 0929    Subjective  feeling great, just didn't realize how weal I was    Currently in Pain?  No/denies                       [facility] Adult PT Treatment/Exercise - 04/17/19 0001      Exercises   Exercises  Lumbar      Lumbar Exercises: Machines for Strengthening   Cybex Knee Extension  5# 2 sets 10    Cybex Knee Flexion  15# 2 sets 10    Other Lumbar Machine Exercise  row 15# 15x    Other Lumbar Machine Exercise  lat pull 15x 15#      Lumbar Exercises: Standing   Row  Strengthening;Both;15 reps;Theraband    Theraband Level (Row)  Level 2 (Red)    Shoulder Extension  Strengthening;Both;15 reps;Theraband    Theraband Level  (Shoulder Extension)  Level 2 (Red)    Other Standing Lumbar Exercises  BIL shld ER red tband 15      Knee/Hip Exercises: Aerobic   Nustep  L 5 5 min      Knee/Hip Exercises: Standing   Hip Flexion  Stengthening;Both;15 reps;Knee straight   red tband   Hip Abduction  Stengthening;Both;15 reps;Knee straight   red tband   Hip Extension  Both;15 reps;Knee straight;Stengthening   red tband     Knee/Hip Exercises: Seated   Sit to Sand  10 reps;without UE support   wt ball chest press              PT Short Term Goals - 04/13/19 1526      PT SHORT TERM GOAL #1   Title  independent with initial HEP    Time  2    Period  Weeks    Status  New        PT Long Term Goals - 04/13/19 1526      PT LONG TERM  GOAL #1   Title  be safe with a return to the gym    Time  8    Period  Weeks    Status  New      PT LONG TERM GOAL #2   Title  decrease pain 50%    Time  8    Period  Weeks    Status  New      PT LONG TERM GOAL #3   Title  increase lumbar ROM 25%    Time  8    Period  Weeks    Status  New      PT LONG TERM GOAL #4   Title  increase strength of hips to 4/5    Time  8    Period  Weeks    Status  New      PT LONG TERM GOAL #5   Title  walk holding good posture for 5 minutes    Time  8    Period  Weeks    Status  New            Plan - 04/17/19 0957    Clinical Impression Statement  cued with all ex to stand up tall and maintain with core activation. pt tolerated initial ex without increased pain but did not she felt week and pt did fatigue with ex esp at end of reps.    PT Treatment/Interventions  ADLs/Self Care Home Management;Cryotherapy;Electrical Stimulation;Moist Heat;Therapeutic exercise;Balance training;Neuromuscular re-education;Stair training;Functional mobility training;Therapeutic activities;Patient/family education;Manual techniques    PT Next Visit Plan  assess response to ex and progress       Patient will benefit from skilled  therapeutic intervention in order to improve the following deficits and impairments:  Abnormal gait, Improper body mechanics, Pain, Postural dysfunction, Increased muscle spasms, Decreased mobility, Cardiopulmonary status limiting activity, Decreased activity tolerance, Decreased endurance, Decreased range of motion, Decreased strength, Impaired flexibility, Difficulty walking, Decreased balance  Visit Diagnosis: 1. Difficulty in walking, not elsewhere classified   2. Muscle weakness (generalized)   3. Abnormal posture        Problem List Patient Active Problem List   Diagnosis Date Noted  . Preventative health care 03/29/2019  . Pneumonia of left lower lobe due to infectious organism ([place]) 09/01/2018  . Vaginal atrophy 03/24/2018  . Seasonal allergies 02/08/2018  . Erythema nodosum 06/09/2016  . Abscess of groin, left 11/10/2014  . Abscess of left groin 11/10/2014  . Cellulitis 10/20/2014  . Herpes zoster 09/23/2013  . Superficial bruising of finger 02/16/2013  . Hyperlipidemia 11/01/2011  . Palpitations 05/23/2011  . Essential hypertension 04/23/2011  . Cough 04/09/2011  . THYROID FUNCTION TEST, ABNORMAL 12/20/2009  . COLONIC POLYPS, HX OF 11/08/2008  . Asthma 01/21/2008  . GERD 01/21/2008  . IRRITABLE BOWEL SYNDROME 01/21/2008  . FIBROMYALGIA 01/21/2008  . DEFICIENCY, VITAMIN D NOS 04/02/2007  . DEGENERATIVE DISC DISEASE 04/02/2007  . GLAUCOMA, LOW TENSION 02/06/2007  . Osteoporosis 02/06/2007    PAYSEUR,[name] PTA 04/17/2019, 9:59 AM  [place], [place], [address] Phone: [phone]   Fax:  [phone]  Name: Kayla [name] MRN: [id number] Date of Birth: [birth year]

## 2019-04-21 ENCOUNTER — Other Ambulatory Visit: Payer: Self-pay

## 2019-04-21 ENCOUNTER — Ambulatory Visit: Payer: Medicare Other | Admitting: Physical Therapy

## 2019-04-21 DIAGNOSIS — R293 Abnormal posture: Secondary | ICD-10-CM

## 2019-04-21 DIAGNOSIS — M6281 Muscle weakness (generalized): Secondary | ICD-10-CM

## 2019-04-21 DIAGNOSIS — M545 Low back pain: Secondary | ICD-10-CM | POA: Diagnosis not present

## 2019-04-21 DIAGNOSIS — R262 Difficulty in walking, not elsewhere classified: Secondary | ICD-10-CM | POA: Diagnosis not present

## 2019-04-21 DIAGNOSIS — M62830 Muscle spasm of back: Secondary | ICD-10-CM | POA: Diagnosis not present

## 2019-04-21 NOTE — Therapy (Signed)
[place], [place], [address] Phone: [phone]   Fax:  [phone]  Physical Therapy Treatment  Patient Details  Name: Kayla [name] MRN: [id number] Date of Birth: 1946-08-12 Referring Provider (PT): Etter Sjogren   Encounter Date: 04/21/2019  PT End of Session - 04/21/19 1119    Visit Number  3    Date for PT Re-Evaluation  06/13/19    PT Start Time  1050    PT Stop Time  1130    PT Time Calculation (min)  40 min       Past Medical History:  Diagnosis Date  . Asthma   . CAP (community acquired pneumonia) 08/2012   Dr Birdie Riddle  . Diverticulitis   . Diverticulosis   . Fasting hyperglycemia 04/2011   FBS 108  . Fibromyalgia   . Fundic gland polyps of stomach, benign   . GERD (gastroesophageal reflux disease)    gastric polyp x3  . Glaucoma     Dr Bing Plume  . IBS (irritable bowel syndrome)   . Osteopenia    last 07/2011  . Pneumonia     OP as child  . Shingles     Past Surgical History:  Procedure Laterality Date  . COLONOSCOPY W/ POLYPECTOMY  [phone]   last  colonoscopy 2007, Dr Carlean Purl  . ESOPHAGOGASTRODUODENOSCOPY    . SPINE SURGERY     T9-L5 fusions    There were no vitals filed for this visit.  Subjective Assessment - 04/21/19 1055    Subjective  not even a little bit sore after last session, felt lik eI could move better    Currently in Pain?  No/denies                       [facility] Adult PT Treatment/Exercise - 04/21/19 0001      Lumbar Exercises: Machines for Strengthening   Cybex Lumbar Extension  black tband flex/ext   2 sets 10   Cybex Knee Extension  10# 2 sets 10    Cybex Knee Flexion  20# 2 sets 10    Other Lumbar Machine Exercise  lat pull 2 sets 10 20#      Lumbar Exercises: Standing   Other Standing Lumbar Exercises  wt ball ext and rotation 15 each      Knee/Hip Exercises: Aerobic   Nustep  L 6 6 min      Knee/Hip Exercises:  Seated   Sit to Sand  10 reps;without UE support   with wt ball            PT Education - 04/21/19 1057    Education Details  hip 3 way red tband, scap stab red tband    Person(s) Educated  Patient    Methods  Explanation;Demonstration;Handout    Comprehension  Verbalized understanding;Returned demonstration       PT Short Term Goals - 04/21/19 1119      PT SHORT TERM GOAL #1   Title  independent with initial HEP    Status  Achieved        PT Long Term Goals - 04/13/19 1526      PT LONG TERM GOAL #1   Title  be safe with a return to the gym    Time  8    Period  Weeks    Status  New      PT LONG TERM GOAL #2   Title  decrease pain 50%    Time  8    Period  Weeks    Status  New      PT LONG TERM GOAL #3   Title  increase lumbar ROM 25%    Time  8    Period  Weeks    Status  New      PT LONG TERM GOAL #4   Title  increase strength of hips to 4/5    Time  8    Period  Weeks    Status  New      PT LONG TERM GOAL #5   Title  walk holding good posture for 5 minutes    Time  8    Period  Weeks    Status  New            Plan - 04/21/19 1119    Clinical Impression Statement  no pain with ex but did fatigue with hip abd and scap stab, cues to stay up tall and engage core. increased wts and reps    PT Treatment/Interventions  ADLs/Self Care Home Management;Cryotherapy;Electrical Stimulation;Moist Heat;Therapeutic exercise;Balance training;Neuromuscular re-education;Stair training;Functional mobility training;Therapeutic activities;Patient/family education;Manual techniques    PT Next Visit Plan  check HEP and progress       Patient will benefit from skilled therapeutic intervention in order to improve the following deficits and impairments:  Abnormal gait, Improper body mechanics, Pain, Postural dysfunction, Increased muscle spasms, Decreased mobility, Cardiopulmonary status limiting activity, Decreased activity tolerance, Decreased endurance, Decreased  range of motion, Decreased strength, Impaired flexibility, Difficulty walking, Decreased balance  Visit Diagnosis: 1. Difficulty in walking, not elsewhere classified   2. Muscle weakness (generalized)   3. Abnormal posture        Problem List Patient Active Problem List   Diagnosis Date Noted  . Preventative health care 03/29/2019  . Pneumonia of left lower lobe due to infectious organism ([place]) 09/01/2018  . Vaginal atrophy 03/24/2018  . Seasonal allergies 02/08/2018  . Erythema nodosum 06/09/2016  . Abscess of groin, left 11/10/2014  . Abscess of left groin 11/10/2014  . Cellulitis 10/20/2014  . Herpes zoster 09/23/2013  . Superficial bruising of finger 02/16/2013  . Hyperlipidemia 11/01/2011  . Palpitations 05/23/2011  . Essential hypertension 04/23/2011  . Cough 04/09/2011  . THYROID FUNCTION TEST, ABNORMAL 12/20/2009  . COLONIC POLYPS, HX OF 11/08/2008  . Asthma 01/21/2008  . GERD 01/21/2008  . IRRITABLE BOWEL SYNDROME 01/21/2008  . FIBROMYALGIA 01/21/2008  . DEFICIENCY, VITAMIN D NOS 04/02/2007  . DEGENERATIVE DISC DISEASE 04/02/2007  . GLAUCOMA, LOW TENSION 02/06/2007  . Osteoporosis 02/06/2007    PAYSEUR,[name] PTA 04/21/2019, 11:22 AM  [place], [place], [address] Phone: [phone]   Fax:  [phone]  Name: Kayla [name] MRN: [id number] Date of Birth: 12-09-1945

## 2019-04-22 DIAGNOSIS — Z8262 Family history of osteoporosis: Secondary | ICD-10-CM | POA: Diagnosis not present

## 2019-04-22 DIAGNOSIS — Z1231 Encounter for screening mammogram for malignant neoplasm of breast: Secondary | ICD-10-CM | POA: Diagnosis not present

## 2019-04-22 DIAGNOSIS — Z981 Arthrodesis status: Secondary | ICD-10-CM | POA: Diagnosis not present

## 2019-04-22 DIAGNOSIS — Z803 Family history of malignant neoplasm of breast: Secondary | ICD-10-CM | POA: Diagnosis not present

## 2019-04-22 DIAGNOSIS — M810 Age-related osteoporosis without current pathological fracture: Secondary | ICD-10-CM | POA: Diagnosis not present

## 2019-04-22 LAB — HM MAMMOGRAPHY

## 2019-04-22 LAB — HM DEXA SCAN

## 2019-04-23 ENCOUNTER — Other Ambulatory Visit: Payer: Self-pay

## 2019-04-23 ENCOUNTER — Ambulatory Visit: Payer: Medicare Other | Admitting: Physical Therapy

## 2019-04-23 DIAGNOSIS — M62830 Muscle spasm of back: Secondary | ICD-10-CM | POA: Diagnosis not present

## 2019-04-23 DIAGNOSIS — R262 Difficulty in walking, not elsewhere classified: Secondary | ICD-10-CM | POA: Diagnosis not present

## 2019-04-23 DIAGNOSIS — M545 Low back pain: Secondary | ICD-10-CM | POA: Diagnosis not present

## 2019-04-23 DIAGNOSIS — R293 Abnormal posture: Secondary | ICD-10-CM | POA: Diagnosis not present

## 2019-04-23 DIAGNOSIS — M6281 Muscle weakness (generalized): Secondary | ICD-10-CM

## 2019-04-23 NOTE — Therapy (Signed)
[place], [place], [address] Phone: [phone]   Fax:  [phone]  Physical Therapy Treatment  Patient Details  Name: Kayla [name] MRN: [id number] Date of Birth: 1945-10-15 Referring Provider (PT): Etter Sjogren   Encounter Date: 04/23/2019  PT End of Session - 04/23/19 1037    Visit Number  4    Date for PT Re-Evaluation  06/13/19    PT Start Time  1030    PT Stop Time  1100    PT Time Calculation (min)  30 min       Past Medical History:  Diagnosis Date  . Asthma   . CAP (community acquired pneumonia) 08/2012   Dr Birdie Riddle  . Diverticulitis   . Diverticulosis   . Fasting hyperglycemia 04/2011   FBS 108  . Fibromyalgia   . Fundic gland polyps of stomach, benign   . GERD (gastroesophageal reflux disease)    gastric polyp x3  . Glaucoma     Dr Bing Plume  . IBS (irritable bowel syndrome)   . Osteopenia    last 07/2011  . Pneumonia     OP as child  . Shingles     Past Surgical History:  Procedure Laterality Date  . COLONOSCOPY W/ POLYPECTOMY  [phone]   last  colonoscopy 2007, Dr Carlean Purl  . ESOPHAGOGASTRODUODENOSCOPY    . SPINE SURGERY     T9-L5 fusions    There were no vitals filed for this visit.  Subjective Assessment - 04/23/19 1028    Subjective  pt 15 min late- mixed up times. HEP going well    Currently in Pain?  No/denies                       [facility] Adult PT Treatment/Exercise - 04/23/19 0001      Lumbar Exercises: Aerobic   Elliptical  I 6 R 5 2 fwd/2 back      Lumbar Exercises: Machines for Strengthening   Cybex Lumbar Extension  black tband flex/ext   20X each   Leg Press  30 # 2 sets 15 ( cuing needed to fully ext knees)   calf raises 30# 2 sets 15   Other Lumbar Machine Exercise  lat pull 2 sets 15 20#      Lumbar Exercises: Standing   Other Standing Lumbar Exercises  pulley row ( 10#) and ext ( 5#)2 sets 10 10#   cued to engage core    Other Standing Lumbar Exercises  4# dead lift 2 sets 10   postural cuing needed     Lumbar Exercises: Supine   Ab Set  15 reps;3 seconds   with pysio ball     Knee/Hip Exercises: Standing   Lateral Step Up  Both;1 set;15 reps;Hand Hold: 2;Step Height: 6"   opp leg abd   Forward Step Up  Both;1 set;15 reps;Hand Hold: 2;Step Height: 6"   opp leg ext              PT Short Term Goals - 04/21/19 1119      PT SHORT TERM GOAL #1   Title  independent with initial HEP    Status  Achieved        PT Long Term Goals - 04/13/19 1526      PT LONG TERM GOAL #1   Title  be safe with a return to the gym    Time  8  Period  Weeks    Status  New      PT LONG TERM GOAL #2   Title  decrease pain 50%    Time  8    Period  Weeks    Status  New      PT LONG TERM GOAL #3   Title  increase lumbar ROM 25%    Time  8    Period  Weeks    Status  New      PT LONG TERM GOAL #4   Title  increase strength of hips to 4/5    Time  8    Period  Weeks    Status  New      PT LONG TERM GOAL #5   Title  walk holding good posture for 5 minutes    Time  8    Period  Weeks    Status  New            Plan - 04/23/19 1038    Clinical Impression Statement  changed up ex and cued to engage core and maintain upright trunk with ther ex as she tends to flex and compensate. pt compliant with HEP for stretching and strength    PT Treatment/Interventions  ADLs/Self Care Home Management;Cryotherapy;Electrical Stimulation;Moist Heat;Therapeutic exercise;Balance training;Neuromuscular re-education;Stair training;Functional mobility training;Therapeutic activities;Patient/family education;Manual techniques    PT Next Visit Plan  core stab       Patient will benefit from skilled therapeutic intervention in order to improve the following deficits and impairments:  Abnormal gait, Improper body mechanics, Pain, Postural dysfunction, Increased muscle spasms, Decreased mobility, Cardiopulmonary  status limiting activity, Decreased activity tolerance, Decreased endurance, Decreased range of motion, Decreased strength, Impaired flexibility, Difficulty walking, Decreased balance  Visit Diagnosis: 1. Muscle weakness (generalized)   2. Difficulty in walking, not elsewhere classified   3. Abnormal posture        Problem List Patient Active Problem List   Diagnosis Date Noted  . Preventative health care 03/29/2019  . Pneumonia of left lower lobe due to infectious organism ([place]) 09/01/2018  . Vaginal atrophy 03/24/2018  . Seasonal allergies 02/08/2018  . Erythema nodosum 06/09/2016  . Abscess of groin, left 11/10/2014  . Abscess of left groin 11/10/2014  . Cellulitis 10/20/2014  . Herpes zoster 09/23/2013  . Superficial bruising of finger 02/16/2013  . Hyperlipidemia 11/01/2011  . Palpitations 05/23/2011  . Essential hypertension 04/23/2011  . Cough 04/09/2011  . THYROID FUNCTION TEST, ABNORMAL 12/20/2009  . COLONIC POLYPS, HX OF 11/08/2008  . Asthma 01/21/2008  . GERD 01/21/2008  . IRRITABLE BOWEL SYNDROME 01/21/2008  . FIBROMYALGIA 01/21/2008  . DEFICIENCY, VITAMIN D NOS 04/02/2007  . DEGENERATIVE DISC DISEASE 04/02/2007  . GLAUCOMA, LOW TENSION 02/06/2007  . Osteoporosis 02/06/2007    [name],[name] PTA 04/23/2019, 10:49 AM  [place], [place], [address] Phone: [phone]   Fax:  [phone]  Name: Kayla [name] MRN: [id number] Date of Birth: 02-20-1946

## 2019-04-27 ENCOUNTER — Other Ambulatory Visit: Payer: Self-pay | Admitting: Family Medicine

## 2019-04-28 ENCOUNTER — Other Ambulatory Visit: Payer: Self-pay

## 2019-04-28 ENCOUNTER — Encounter: Payer: Self-pay | Admitting: Physical Therapy

## 2019-04-28 ENCOUNTER — Ambulatory Visit: Payer: Medicare Other | Admitting: Physical Therapy

## 2019-04-28 DIAGNOSIS — M545 Low back pain, unspecified: Secondary | ICD-10-CM

## 2019-04-28 DIAGNOSIS — M6281 Muscle weakness (generalized): Secondary | ICD-10-CM

## 2019-04-28 DIAGNOSIS — R293 Abnormal posture: Secondary | ICD-10-CM

## 2019-04-28 DIAGNOSIS — M62830 Muscle spasm of back: Secondary | ICD-10-CM | POA: Diagnosis not present

## 2019-04-28 DIAGNOSIS — R262 Difficulty in walking, not elsewhere classified: Secondary | ICD-10-CM | POA: Diagnosis not present

## 2019-04-28 NOTE — Therapy (Signed)
[place]la [name], [place], [address] Phone: [phone]   Fax:  [phone]  Physical Therapy Treatment  Patient Details  Name: Kayla [name] MRN: [id number] Date of Birth: 09/10/1946 Referring Provider (PT): Etter Sjogren   Encounter Date: 04/28/2019  PT End of Session - 04/28/19 1140    Visit Number  5    Date for PT Re-Evaluation  06/13/19    PT Start Time  1055    PT Stop Time  1140    PT Time Calculation (min)  45 min       Past Medical History:  Diagnosis Date  . Asthma   . CAP (community acquired pneumonia) 08/2012   Dr Birdie Riddle  . Diverticulitis   . Diverticulosis   . Fasting hyperglycemia 04/2011   FBS 108  . Fibromyalgia   . Fundic gland polyps of stomach, benign   . GERD (gastroesophageal reflux disease)    gastric polyp x3  . Glaucoma     Dr Bing Plume  . IBS (irritable bowel syndrome)   . Osteopenia    last 07/2011  . Pneumonia     OP as child  . Shingles     Past Surgical History:  Procedure Laterality Date  . COLONOSCOPY W/ POLYPECTOMY  [phone]   last  colonoscopy 2007, Dr Carlean Purl  . ESOPHAGOGASTRODUODENOSCOPY    . SPINE SURGERY     T9-L5 fusions    There were no vitals filed for this visit.  Subjective Assessment - 04/28/19 1101    Subjective  knees have been sore                       [facility] Adult PT Treatment/Exercise - 04/28/19 0001      Lumbar Exercises: Machines for Strengthening   Cybex Lumbar Extension  black tband flex/ext   20X   Cybex Knee Extension  10# 2 sets 10    Cybex Knee Flexion  20# 2 sets 10    Other Lumbar Machine Exercise  row 10# 10X. 15# 10x      Lumbar Exercises: Standing   Shoulder Extension  Both;10 reps;20 reps;Strengthening   pulley 5#   Other Standing Lumbar Exercises  4# dead lift 2 sets 10      Lumbar Exercises: Seated   Other Seated Lumbar Exercises  lumbar flexion Black Tband 2x15      Lumbar Exercises:  Supine   Single Leg Bridge  Compliant;10 reps;2 seconds      Lumbar Exercises: Prone   Other Prone Lumbar Exercises  attepted plank variations but caused pain so stopped      Knee/Hip Exercises: Seated   Sit to Sand  10 reps;without UE support   wt ball press              PT Short Term Goals - 04/21/19 1119      PT SHORT TERM GOAL #1   Title  independent with initial HEP    Status  Achieved        PT Long Term Goals - 04/13/19 1526      PT LONG TERM GOAL #1   Title  be safe with a return to the gym    Time  8    Period  Weeks    Status  New      PT LONG TERM GOAL #2   Title  decrease pain 50%    Time  8  Period  Weeks    Status  New      PT LONG TERM GOAL #3   Title  increase lumbar ROM 25%    Time  8    Period  Weeks    Status  New      PT LONG TERM GOAL #4   Title  increase strength of hips to 4/5    Time  8    Period  Weeks    Status  New      PT LONG TERM GOAL #5   Title  walk holding good posture for 5 minutes    Time  8    Period  Weeks    Status  New            Plan - 04/28/19 1140    Clinical Impression Statement  pt struggled with core activation and posture with ex and needed verb and tactile cuing. increased difficulty with plank weakness and pain    PT Treatment/Interventions  ADLs/Self Care Home Management;Cryotherapy;Electrical Stimulation;Moist Heat;Therapeutic exercise;Balance training;Neuromuscular re-education;Stair training;Functional mobility training;Therapeutic activities;Patient/family education;Manual techniques    PT Next Visit Plan  core stab       Patient will benefit from skilled therapeutic intervention in order to improve the following deficits and impairments:  Abnormal gait, Improper body mechanics, Pain, Postural dysfunction, Increased muscle spasms, Decreased mobility, Cardiopulmonary status limiting activity, Decreased activity tolerance, Decreased endurance, Decreased range of motion, Decreased strength,  Impaired flexibility, Difficulty walking, Decreased balance  Visit Diagnosis: 1. Muscle weakness (generalized)   2. Difficulty in walking, not elsewhere classified   3. Abnormal posture   4. Acute bilateral low back pain without sciatica        Problem List Patient Active Problem List   Diagnosis Date Noted  . Preventative health care 03/29/2019  . Pneumonia of left lower lobe due to infectious organism ([place]) 09/01/2018  . Vaginal atrophy 03/24/2018  . Seasonal allergies 02/08/2018  . Erythema nodosum 06/09/2016  . Abscess of groin, left 11/10/2014  . Abscess of left groin 11/10/2014  . Cellulitis 10/20/2014  . Herpes zoster 09/23/2013  . Superficial bruising of finger 02/16/2013  . Hyperlipidemia 11/01/2011  . Palpitations 05/23/2011  . Essential hypertension 04/23/2011  . Cough 04/09/2011  . THYROID FUNCTION TEST, ABNORMAL 12/20/2009  . COLONIC POLYPS, HX OF 11/08/2008  . Asthma 01/21/2008  . GERD 01/21/2008  . IRRITABLE BOWEL SYNDROME 01/21/2008  . FIBROMYALGIA 01/21/2008  . DEFICIENCY, VITAMIN D NOS 04/02/2007  . DEGENERATIVE DISC DISEASE 04/02/2007  . GLAUCOMA, LOW TENSION 02/06/2007  . Osteoporosis 02/06/2007    Baylin Gamblin,[name] PTA 04/28/2019, 11:45 AM  [place] [name], [place], [address] Phone: [phone]   Fax:  [phone]  Name: Kayla [name] MRN: [id number] Date of Birth: [birth year]

## 2019-04-30 ENCOUNTER — Other Ambulatory Visit: Payer: Self-pay

## 2019-04-30 ENCOUNTER — Ambulatory Visit: Payer: Medicare Other | Admitting: Physical Therapy

## 2019-04-30 ENCOUNTER — Encounter: Payer: Self-pay | Admitting: Family Medicine

## 2019-04-30 DIAGNOSIS — R262 Difficulty in walking, not elsewhere classified: Secondary | ICD-10-CM | POA: Diagnosis not present

## 2019-04-30 DIAGNOSIS — M6281 Muscle weakness (generalized): Secondary | ICD-10-CM | POA: Diagnosis not present

## 2019-04-30 DIAGNOSIS — M62830 Muscle spasm of back: Secondary | ICD-10-CM | POA: Diagnosis not present

## 2019-04-30 DIAGNOSIS — M545 Low back pain: Secondary | ICD-10-CM | POA: Diagnosis not present

## 2019-04-30 DIAGNOSIS — R293 Abnormal posture: Secondary | ICD-10-CM

## 2019-04-30 NOTE — Therapy (Signed)
[place], [place], [address] Phone: [phone]   Fax:  [phone]  Physical Therapy Treatment  Patient Details  Name: Kayla [name] MRN: [id number] Date of Birth: Nov 19, 1945 Referring Provider (PT): Etter Sjogren   Encounter Date: 04/30/2019  PT End of Session - 04/30/19 1137    Visit Number  6    Date for PT Re-Evaluation  06/13/19    PT Start Time  1055    PT Stop Time  1135    PT Time Calculation (min)  40 min    Activity Tolerance  Patient tolerated treatment well    Behavior During Therapy  [facility] for tasks assessed/performed       Past Medical History:  Diagnosis Date  . Asthma   . CAP (community acquired pneumonia) 08/2012   Dr Birdie Riddle  . Diverticulitis   . Diverticulosis   . Fasting hyperglycemia 04/2011   FBS 108  . Fibromyalgia   . Fundic gland polyps of stomach, benign   . GERD (gastroesophageal reflux disease)    gastric polyp x3  . Glaucoma     Dr Bing Plume  . IBS (irritable bowel syndrome)   . Osteopenia    last 07/2011  . Pneumonia     OP as child  . Shingles     Past Surgical History:  Procedure Laterality Date  . COLONOSCOPY W/ POLYPECTOMY  [phone]   last  colonoscopy 2007, Dr Carlean Purl  . ESOPHAGOGASTRODUODENOSCOPY    . SPINE SURGERY     T9-L5 fusions    There were no vitals filed for this visit.  Subjective Assessment - 04/30/19 1054    Subjective  stiffness in upper leg from scrubbing floors, been practicing stretches and HEP    Currently in Pain?  No/denies         [facility] PT Assessment - 04/30/19 0001      AROM   Overall AROM Comments  flexion and SB WFLS, ext decreased 50%                   [facility] Adult PT Treatment/Exercise - 04/30/19 0001      Lumbar Exercises: Aerobic   Elliptical  I 8 R 6 73fd/3bckwd      Lumbar Exercises: Machines for Strengthening   Cybex Lumbar Extension  black tband flex/ext 2 sets of 10    Cybex Knee  Extension  15# 2 sets 10    Cybex Knee Flexion  25# 2 sets 10    Other Lumbar Machine Exercise  Row 15# 2sets 10 reps      Lumbar Exercises: Standing   Other Standing Lumbar Exercises  wt ball back extension    Other Standing Lumbar Exercises  5# deadlift 2 sets 10 reps      Lumbar Exercises: Supine   Single Leg Bridge  10 reps;Compliant   2 sets, one without red physio ball     Knee/Hip Exercises: Machines for Strengthening   Cybex Leg Press  2 sets 10 reps 40#               PT Short Term Goals - 04/21/19 1119      PT SHORT TERM GOAL #1   Title  independent with initial HEP    Status  Achieved        PT Long Term Goals - 04/30/19 1117      PT LONG TERM GOAL #1   Title  be safe with a  return to the gym    Status  On-going      PT LONG TERM GOAL #2   Title  decrease pain 50%    Status  Achieved      PT LONG TERM GOAL #3   Title  increase lumbar ROM 25%    Status  Achieved      PT LONG TERM GOAL #4   Title  increase strength of hips to 4/5    Status  On-going      PT LONG TERM GOAL #5   Title  walk holding good posture for 5 minutes    Status  Partially Met            Plan - 04/30/19 1138    Clinical Impression Statement  pt. making progress toward all goals. pt. was able to perform exercises with proper form but required encouragement to push herself at times.    PT Treatment/Interventions  ADLs/Self Care Home Management;Cryotherapy;Electrical Stimulation;Moist Heat;Therapeutic exercise;Balance training;Neuromuscular re-education;Stair training;Functional mobility training;Therapeutic activities;Patient/family education;Manual techniques    PT Next Visit Plan  continue focusing on exercises emphasizing postural control and core stabilization       Patient will benefit from skilled therapeutic intervention in order to improve the following deficits and impairments:  Abnormal gait, Improper body mechanics, Pain, Postural dysfunction, Increased muscle  spasms, Decreased mobility, Cardiopulmonary status limiting activity, Decreased activity tolerance, Decreased endurance, Decreased range of motion, Decreased strength, Impaired flexibility, Difficulty walking, Decreased balance  Visit Diagnosis: Muscle weakness (generalized)  Abnormal posture  Difficulty in walking, not elsewhere classified     Problem List Patient Active Problem List   Diagnosis Date Noted  . Preventative health care 03/29/2019  . Pneumonia of left lower lobe due to infectious organism ([place]) 09/01/2018  . Vaginal atrophy 03/24/2018  . Seasonal allergies 02/08/2018  . Erythema nodosum 06/09/2016  . Abscess of groin, left 11/10/2014  . Abscess of left groin 11/10/2014  . Cellulitis 10/20/2014  . Herpes zoster 09/23/2013  . Superficial bruising of finger 02/16/2013  . Hyperlipidemia 11/01/2011  . Palpitations 05/23/2011  . Essential hypertension 04/23/2011  . Cough 04/09/2011  . THYROID FUNCTION TEST, ABNORMAL 12/20/2009  . COLONIC POLYPS, HX OF 11/08/2008  . Asthma 01/21/2008  . GERD 01/21/2008  . IRRITABLE BOWEL SYNDROME 01/21/2008  . FIBROMYALGIA 01/21/2008  . DEFICIENCY, VITAMIN D NOS 04/02/2007  . DEGENERATIVE DISC DISEASE 04/02/2007  . GLAUCOMA, LOW TENSION 02/06/2007  . Osteoporosis 02/06/2007   Darcella Cheshire, SPTA Nihal Doan,[name] PTA 04/30/2019, 11:42 AM  [place], [place], [address] Phone: [phone]   Fax:  [phone]  Name: Kayla [name] MRN: [id number] Date of Birth: 1946/06/09

## 2019-05-01 ENCOUNTER — Encounter: Payer: Self-pay | Admitting: Family Medicine

## 2019-05-01 DIAGNOSIS — R921 Mammographic calcification found on diagnostic imaging of breast: Secondary | ICD-10-CM | POA: Diagnosis not present

## 2019-05-04 ENCOUNTER — Encounter: Payer: Self-pay | Admitting: Family Medicine

## 2019-05-04 DIAGNOSIS — J4520 Mild intermittent asthma, uncomplicated: Secondary | ICD-10-CM | POA: Diagnosis not present

## 2019-05-05 ENCOUNTER — Ambulatory Visit: Payer: Medicare Other | Admitting: Physical Therapy

## 2019-05-05 ENCOUNTER — Encounter: Payer: Self-pay | Admitting: Physical Therapy

## 2019-05-05 ENCOUNTER — Other Ambulatory Visit: Payer: Self-pay

## 2019-05-05 DIAGNOSIS — R262 Difficulty in walking, not elsewhere classified: Secondary | ICD-10-CM | POA: Diagnosis not present

## 2019-05-05 DIAGNOSIS — M545 Low back pain: Secondary | ICD-10-CM | POA: Diagnosis not present

## 2019-05-05 DIAGNOSIS — R293 Abnormal posture: Secondary | ICD-10-CM | POA: Diagnosis not present

## 2019-05-05 DIAGNOSIS — M62830 Muscle spasm of back: Secondary | ICD-10-CM | POA: Diagnosis not present

## 2019-05-05 DIAGNOSIS — M6281 Muscle weakness (generalized): Secondary | ICD-10-CM

## 2019-05-05 NOTE — Therapy (Signed)
[place], [place], [address] Phone: [phone]   Fax:  [phone]  Physical Therapy Treatment  Patient Details  Name: Kayla [name] MRN: [id number] Date of Birth: 05-Mar-1946 Referring Provider (PT): Etter Sjogren   Encounter Date: 05/05/2019  PT End of Session - 05/05/19 1141    Visit Number  7    Date for PT Re-Evaluation  06/13/19    PT Start Time  1055    PT Stop Time  1140    PT Time Calculation (min)  45 min    Activity Tolerance  Patient tolerated treatment well    Behavior During Therapy  [facility] for tasks assessed/performed       Past Medical History:  Diagnosis Date  . Asthma   . CAP (community acquired pneumonia) 08/2012   Dr Birdie Riddle  . Diverticulitis   . Diverticulosis   . Fasting hyperglycemia 04/2011   FBS 108  . Fibromyalgia   . Fundic gland polyps of stomach, benign   . GERD (gastroesophageal reflux disease)    gastric polyp x3  . Glaucoma     Dr Bing Plume  . IBS (irritable bowel syndrome)   . Osteopenia    last 07/2011  . Pneumonia     OP as child  . Shingles     Past Surgical History:  Procedure Laterality Date  . COLONOSCOPY W/ POLYPECTOMY  [phone]   last  colonoscopy 2007, Dr Carlean Purl  . ESOPHAGOGASTRODUODENOSCOPY    . SPINE SURGERY     T9-L5 fusions    There were no vitals filed for this visit.  Subjective Assessment - 05/05/19 1055    Subjective  doing HEP, tried SLS bridge difficult to perform exercise possible bad form. more comfortbale walking around.    Currently in Pain?  No/denies                       [facility] Adult PT Treatment/Exercise - 05/05/19 0001      Lumbar Exercises: Aerobic   Elliptical  I 8 R 6 17fd/3bckwd      Lumbar Exercises: Machines for Strengthening   Cybex Lumbar Extension  black tband flex/ext 2 sets of 10    Cybex Knee Extension  15# 15 reps    Cybex Knee Flexion  25# 15 reps    Leg Press  30# 2x15   cues  for TKE   Other Lumbar Machine Exercise  Row 15# 15 reps      Lumbar Exercises: Standing   Other Standing Lumbar Exercises  wt ball back extension 2x15    Other Standing Lumbar Exercises  5# deadlift 2 sets 15 reps      Lumbar Exercises: Supine   Bridge  3 seconds;Compliant   red physio ball 2x15              PT Short Term Goals - 04/21/19 1119      PT SHORT TERM GOAL #1   Title  independent with initial HEP    Status  Achieved        PT Long Term Goals - 04/30/19 1117      PT LONG TERM GOAL #1   Title  be safe with a return to the gym    Status  On-going      PT LONG TERM GOAL #2   Title  decrease pain 50%    Status  Achieved      PT LONG TERM  GOAL #3   Title  increase lumbar ROM 25%    Status  Achieved      PT LONG TERM GOAL #4   Title  increase strength of hips to 4/5    Status  On-going      PT LONG TERM GOAL #5   Title  walk holding good posture for 5 minutes    Status  Partially Met            Plan - 05/05/19 1142    Clinical Impression Statement  pt. making progress toward goals evident due to lack of pain, stiffness, and able to perform ADL's around home with less discomfort. pt. was visibly fatigued and adjusted exercises accordingly, pt. would benefit from further treatment to address endurance. pt. need heavy VC's to correct knees going in during deadlift    PT Treatment/Interventions  ADLs/Self Care Home Management;Cryotherapy;Electrical Stimulation;Moist Heat;Therapeutic exercise;Balance training;Neuromuscular re-education;Stair training;Functional mobility training;Therapeutic activities;Patient/family education;Manual techniques       Patient will benefit from skilled therapeutic intervention in order to improve the following deficits and impairments:  Abnormal gait, Improper body mechanics, Pain, Postural dysfunction, Increased muscle spasms, Decreased mobility, Cardiopulmonary status limiting activity, Decreased activity tolerance,  Decreased endurance, Decreased range of motion, Decreased strength, Impaired flexibility, Difficulty walking, Decreased balance  Visit Diagnosis: Muscle weakness (generalized)  Abnormal posture     Problem List Patient Active Problem List   Diagnosis Date Noted  . Preventative health care 03/29/2019  . Pneumonia of left lower lobe due to infectious organism ([place]) 09/01/2018  . Vaginal atrophy 03/24/2018  . Seasonal allergies 02/08/2018  . Erythema nodosum 06/09/2016  . Abscess of groin, left 11/10/2014  . Abscess of left groin 11/10/2014  . Cellulitis 10/20/2014  . Herpes zoster 09/23/2013  . Superficial bruising of finger 02/16/2013  . Hyperlipidemia 11/01/2011  . Palpitations 05/23/2011  . Essential hypertension 04/23/2011  . Cough 04/09/2011  . THYROID FUNCTION TEST, ABNORMAL 12/20/2009  . COLONIC POLYPS, HX OF 11/08/2008  . Asthma 01/21/2008  . GERD 01/21/2008  . IRRITABLE BOWEL SYNDROME 01/21/2008  . FIBROMYALGIA 01/21/2008  . DEFICIENCY, VITAMIN D NOS 04/02/2007  . DEGENERATIVE DISC DISEASE 04/02/2007  . GLAUCOMA, LOW TENSION 02/06/2007  . Osteoporosis 02/06/2007    Darcella Cheshire, SPTA 05/05/2019, 12:31 PM  [place], [place], [address] Phone: [phone]   Fax:  [phone]  Name: JEANNE DIEFENDORF MRN: [id number] Date of Birth: 1946-04-03

## 2019-05-07 ENCOUNTER — Ambulatory Visit: Payer: Medicare Other | Admitting: Physical Therapy

## 2019-05-12 ENCOUNTER — Encounter: Payer: Self-pay | Admitting: Physical Therapy

## 2019-05-12 ENCOUNTER — Other Ambulatory Visit: Payer: Self-pay

## 2019-05-12 ENCOUNTER — Ambulatory Visit: Payer: Medicare Other | Attending: Family Medicine | Admitting: Physical Therapy

## 2019-05-12 DIAGNOSIS — M62830 Muscle spasm of back: Secondary | ICD-10-CM | POA: Insufficient documentation

## 2019-05-12 DIAGNOSIS — M545 Low back pain: Secondary | ICD-10-CM | POA: Diagnosis not present

## 2019-05-12 DIAGNOSIS — R293 Abnormal posture: Secondary | ICD-10-CM | POA: Insufficient documentation

## 2019-05-12 DIAGNOSIS — M6281 Muscle weakness (generalized): Secondary | ICD-10-CM | POA: Diagnosis not present

## 2019-05-12 DIAGNOSIS — R262 Difficulty in walking, not elsewhere classified: Secondary | ICD-10-CM | POA: Diagnosis not present

## 2019-05-12 NOTE — Therapy (Signed)
[place], [place], [address] Phone: [phone]   Fax:  [phone]  Physical Therapy Treatment  Patient Details  Name: Kayla [name] MRN: [id number] Date of Birth: 1946-08-23 Referring Provider (PT): Etter Sjogren   Encounter Date: 05/12/2019  PT End of Session - 05/12/19 1144    Visit Number  8    Date for PT Re-Evaluation  06/13/19    PT Start Time  1056    PT Stop Time  1143    PT Time Calculation (min)  47 min    Activity Tolerance  Patient tolerated treatment well    Behavior During Therapy  [facility] for tasks assessed/performed       Past Medical History:  Diagnosis Date  . Asthma   . CAP (community acquired pneumonia) 08/2012   Dr Birdie Riddle  . Diverticulitis   . Diverticulosis   . Fasting hyperglycemia 04/2011   FBS 108  . Fibromyalgia   . Fundic gland polyps of stomach, benign   . GERD (gastroesophageal reflux disease)    gastric polyp x3  . Glaucoma     Dr Bing Plume  . IBS (irritable bowel syndrome)   . Osteopenia    last 07/2011  . Pneumonia     OP as child  . Shingles     Past Surgical History:  Procedure Laterality Date  . COLONOSCOPY W/ POLYPECTOMY  [phone]   last  colonoscopy 2007, Dr Carlean Purl  . ESOPHAGOGASTRODUODENOSCOPY    . SPINE SURGERY     T9-L5 fusions    There were no vitals filed for this visit.  Subjective Assessment - 05/12/19 1057    Subjective  a lot of housework between now and last session. both knees aching becuase of it and said shes not concerned about it.    Currently in Pain?  Yes    Pain Score  4     Pain Location  Knee    Pain Orientation  Right;Left                       [facility] Adult PT Treatment/Exercise - 05/12/19 0001      Lumbar Exercises: Aerobic   Elliptical  I 8 R 6 61fd/3bckwd      Lumbar Exercises: Machines for Strengthening   Cybex Lumbar Extension  black tband flex/ext 2 sets of 10   cues to engage  paraspinals   Leg Press  40# 2x10      Lumbar Exercises: Standing   Row  Both;10 reps;Strengthening;Theraband    Theraband Level (Row)  Level 2 (Red)    Row Limitations  2 sets    Shoulder Extension  Both;10 reps;20 reps;Strengthening;Theraband    Theraband Level (Shoulder Extension)  Level 2 (Red)    Shoulder Extension Limitations  2 sets    Other Standing Lumbar Exercises  wt ball back extension 1x15    Other Standing Lumbar Exercises  6# deadlift  2x10      Lumbar Exercises: Seated   Other Seated Lumbar Exercises  Physio ball exercises marching and single leg, ball throws SL      Lumbar Exercises: Supine   Bridge  3 seconds;Compliant;10 reps   2 sets              PT Short Term Goals - 04/21/19 1119      PT SHORT TERM GOAL #1   Title  independent with initial HEP    Status  Achieved  PT Long Term Goals - 04/30/19 1117      PT LONG TERM GOAL #1   Title  be safe with a return to the gym    Status  On-going      PT LONG TERM GOAL #2   Title  decrease pain 50%    Status  Achieved      PT LONG TERM GOAL #3   Title  increase lumbar ROM 25%    Status  Achieved      PT LONG TERM GOAL #4   Title  increase strength of hips to 4/5    Status  On-going      PT LONG TERM GOAL #5   Title  walk holding good posture for 5 minutes    Status  Partially Met            Plan - 05/12/19 1140    Clinical Impression Statement  pt tolerated exercises well, no pain reported in back even after all of the housework she did this past weekend. Pt. did struggle with SL exercises on physio ball with ball toss and will continue to benefit from PT to higher level core stabilization and overall strength of postural muscles   PT Treatment/Interventions  ADLs/Self Care Home Management;Cryotherapy;Electrical Stimulation;Moist Heat;Therapeutic exercise;Balance training;Neuromuscular re-education;Stair training;Functional mobility training;Therapeutic activities;Patient/family  education;Manual techniques    PT Next Visit Plan  continue focusing on exercises emphasizing postural control and core stabilization       Patient will benefit from skilled therapeutic intervention in order to improve the following deficits and impairments:  Abnormal gait, Improper body mechanics, Pain, Postural dysfunction, Increased muscle spasms, Decreased mobility, Cardiopulmonary status limiting activity, Decreased activity tolerance, Decreased endurance, Decreased range of motion, Decreased strength, Impaired flexibility, Difficulty walking, Decreased balance  Visit Diagnosis: Muscle weakness (generalized)  Abnormal posture  Difficulty in walking, not elsewhere classified     Problem List Patient Active Problem List   Diagnosis Date Noted  . Preventative health care 03/29/2019  . Pneumonia of left lower lobe due to infectious organism ([place]) 09/01/2018  . Vaginal atrophy 03/24/2018  . Seasonal allergies 02/08/2018  . Erythema nodosum 06/09/2016  . Abscess of groin, left 11/10/2014  . Abscess of left groin 11/10/2014  . Cellulitis 10/20/2014  . Herpes zoster 09/23/2013  . Superficial bruising of finger 02/16/2013  . Hyperlipidemia 11/01/2011  . Palpitations 05/23/2011  . Essential hypertension 04/23/2011  . Cough 04/09/2011  . THYROID FUNCTION TEST, ABNORMAL 12/20/2009  . COLONIC POLYPS, HX OF 11/08/2008  . Asthma 01/21/2008  . GERD 01/21/2008  . IRRITABLE BOWEL SYNDROME 01/21/2008  . FIBROMYALGIA 01/21/2008  . DEFICIENCY, VITAMIN D NOS 04/02/2007  . DEGENERATIVE DISC DISEASE 04/02/2007  . GLAUCOMA, LOW TENSION 02/06/2007  . Osteoporosis 02/06/2007    Darcella Cheshire, SPTA 05/12/2019, 11:50 AM  [place], [place], [address] Phone: [phone]   Fax:  [phone]  Name: Kayla [name] MRN: [id number] Date of Birth: 1945-12-17

## 2019-05-14 ENCOUNTER — Other Ambulatory Visit: Payer: Self-pay

## 2019-05-14 ENCOUNTER — Ambulatory Visit: Payer: Medicare Other | Admitting: Physical Therapy

## 2019-05-14 ENCOUNTER — Encounter: Payer: Self-pay | Admitting: Physical Therapy

## 2019-05-14 DIAGNOSIS — R262 Difficulty in walking, not elsewhere classified: Secondary | ICD-10-CM

## 2019-05-14 DIAGNOSIS — M6281 Muscle weakness (generalized): Secondary | ICD-10-CM

## 2019-05-14 DIAGNOSIS — R293 Abnormal posture: Secondary | ICD-10-CM

## 2019-05-14 DIAGNOSIS — M62830 Muscle spasm of back: Secondary | ICD-10-CM | POA: Diagnosis not present

## 2019-05-14 DIAGNOSIS — M545 Low back pain: Secondary | ICD-10-CM | POA: Diagnosis not present

## 2019-05-14 NOTE — Therapy (Signed)
[place], [place], [address] Phone: [phone]   Fax:  [phone]  Physical Therapy Treatment  Patient Details  Name: Kayla [name] MRN: [id number] Date of Birth: 05-07-1946 Referring Provider (PT): Etter Sjogren   Encounter Date: 05/14/2019  PT End of Session - 05/14/19 1142    Visit Number  9    Date for PT Re-Evaluation  06/13/19    PT Start Time  [id number]    PT Stop Time  1143    PT Time Calculation (min)  52 min       Past Medical History:  Diagnosis Date  . Asthma   . CAP (community acquired pneumonia) 08/2012   Dr Birdie Riddle  . Diverticulitis   . Diverticulosis   . Fasting hyperglycemia 04/2011   FBS 108  . Fibromyalgia   . Fundic gland polyps of stomach, benign   . GERD (gastroesophageal reflux disease)    gastric polyp x3  . Glaucoma     Dr Bing Plume  . IBS (irritable bowel syndrome)   . Osteopenia    last 07/2011  . Pneumonia     OP as child  . Shingles     Past Surgical History:  Procedure Laterality Date  . COLONOSCOPY W/ POLYPECTOMY  [phone]   last  colonoscopy 2007, Dr Carlean Purl  . ESOPHAGOGASTRODUODENOSCOPY    . SPINE SURGERY     T9-L5 fusions    There were no vitals filed for this visit.  Subjective Assessment - 05/14/19 1052    Subjective  No pain reported, knees are feeling better. no soreness after session    Currently in Pain?  No/denies    Pain Score  0-No pain                       [facility] Adult PT Treatment/Exercise - 05/14/19 0001      Ambulation/Gait   Ambulation/Gait  Yes    Ambulation/Gait Assistance  6: Modified independent (Device/Increase time)   hands out to maintain balance   Ambulation Distance (Feet)  --   5 minute ambulation maintaing proper posture     Lumbar Exercises: Aerobic   Elliptical  I 8 R 6 3fwd/3bckwd      Lumbar Exercises: Machines for Strengthening   Cybex Lumbar Extension  black tband flex/ext 2 sets of 10       Lumbar Exercises: Seated   Other Seated Lumbar Exercises  Physio ball exercises marching and single leg LLE focus extensive tactile and verbal cues for proper righting responses.     Lumbar Exercises: Supine   Bridge  20 reps;Compliant;5 seconds      Lumbar Exercises: Quadruped   Other Quadruped Lumbar Exercises  Birddog alternating arms legs 2x6 cues to maintain posture     Knee/Hip Exercises: Standing   Hip Abduction  Stengthening;Both;2 sets;10 reps   2# visibly fatigued during exercise.              PT Short Term Goals - 04/21/19 1119      PT SHORT TERM GOAL #1   Title  independent with initial HEP    Status  Achieved        PT Long Term Goals - 05/14/19 1113      PT LONG TERM GOAL #5   Title  walk holding good posture for 5 minutes    Status  Achieved            Plan -  05/14/19 1144    Clinical Impression Statement  pt tolerated treatment with no reports of pain. Pt. continues to make progress toward goals. Pt. is limited by weakness, and improper righting responses on physio ball. Will continue to beenfit from PT to increase hip strength, balance, and core stability    PT Treatment/Interventions  ADLs/Self Care Home Management;Cryotherapy;Electrical Stimulation;Moist Heat;Therapeutic exercise;Balance training;Neuromuscular re-education;Stair training;Functional mobility training;Therapeutic activities;Patient/family education;Manual techniques    PT Next Visit Plan  continue focusing on exercises emphasizing postural control and core stabilization       Patient will benefit from skilled therapeutic intervention in order to improve the following deficits and impairments:  Abnormal gait, Improper body mechanics, Pain, Postural dysfunction, Increased muscle spasms, Decreased mobility, Cardiopulmonary status limiting activity, Decreased activity tolerance, Decreased endurance, Decreased range of motion, Decreased strength, Impaired flexibility, Difficulty  walking, Decreased balance  Visit Diagnosis: Muscle weakness (generalized)  Abnormal posture  Difficulty in walking, not elsewhere classified     Problem List Patient Active Problem List   Diagnosis Date Noted  . Preventative health care 03/29/2019  . Pneumonia of left lower lobe due to infectious organism ([place]) 09/01/2018  . Vaginal atrophy 03/24/2018  . Seasonal allergies 02/08/2018  . Erythema nodosum 06/09/2016  . Abscess of groin, left 11/10/2014  . Abscess of left groin 11/10/2014  . Cellulitis 10/20/2014  . Herpes zoster 09/23/2013  . Superficial bruising of finger 02/16/2013  . Hyperlipidemia 11/01/2011  . Palpitations 05/23/2011  . Essential hypertension 04/23/2011  . Cough 04/09/2011  . THYROID FUNCTION TEST, ABNORMAL 12/20/2009  . COLONIC POLYPS, HX OF 11/08/2008  . Asthma 01/21/2008  . GERD 01/21/2008  . IRRITABLE BOWEL SYNDROME 01/21/2008  . FIBROMYALGIA 01/21/2008  . DEFICIENCY, VITAMIN D NOS 04/02/2007  . DEGENERATIVE DISC DISEASE 04/02/2007  . GLAUCOMA, LOW TENSION 02/06/2007  . Osteoporosis 02/06/2007    Darcella Cheshire, SPTA 05/14/2019, 11:48 AM  [place], [place], [address] Phone: [phone]   Fax:  [phone]  Name: MELICENT EISENHUTH MRN: [id number] Date of Birth: November 03, 1945

## 2019-05-19 ENCOUNTER — Other Ambulatory Visit: Payer: Self-pay

## 2019-05-19 ENCOUNTER — Telehealth: Payer: Self-pay | Admitting: Internal Medicine

## 2019-05-19 ENCOUNTER — Encounter: Payer: Self-pay | Admitting: Physical Therapy

## 2019-05-19 ENCOUNTER — Ambulatory Visit: Payer: Medicare Other | Admitting: Physical Therapy

## 2019-05-19 DIAGNOSIS — R293 Abnormal posture: Secondary | ICD-10-CM

## 2019-05-19 DIAGNOSIS — M62830 Muscle spasm of back: Secondary | ICD-10-CM | POA: Diagnosis not present

## 2019-05-19 DIAGNOSIS — R262 Difficulty in walking, not elsewhere classified: Secondary | ICD-10-CM | POA: Diagnosis not present

## 2019-05-19 DIAGNOSIS — M545 Low back pain: Secondary | ICD-10-CM | POA: Diagnosis not present

## 2019-05-19 DIAGNOSIS — M6281 Muscle weakness (generalized): Secondary | ICD-10-CM | POA: Diagnosis not present

## 2019-05-19 NOTE — Therapy (Addendum)
During this treatment session, the therapist was present, participating in and directing the treatment. [place] [address] [place], [place], [address] Phone: [phone]   Fax:  [phone] Progress Note Reporting Period 04/13/19 to 05/19/19 for the first 10 visits  See note below for Objective Data and Assessment of Progress/Goals.      Physical Therapy Treatment  Patient Details  Name: Kayla [name] MRN: [id number] Date of Birth: 24-Mar-1946 Referring Provider (PT): Etter Sjogren   Encounter Date: 05/19/2019  PT End of Session - 05/19/19 1143    Visit Number  10    Date for PT Re-Evaluation  06/13/19    PT Start Time  1100    PT Stop Time  1144    PT Time Calculation (min)  44 min       Past Medical History:  Diagnosis Date  . Asthma   . CAP (community acquired pneumonia) 08/2012   Dr Birdie Riddle  . Diverticulitis   . Diverticulosis   . Fasting hyperglycemia 04/2011   FBS 108  . Fibromyalgia   . Fundic gland polyps of stomach, benign   . GERD (gastroesophageal reflux disease)    gastric polyp x3  . Glaucoma     Dr Bing Plume  . IBS (irritable bowel syndrome)   . Osteopenia    last 07/2011  . Pneumonia     OP as child  . Shingles     Past Surgical History:  Procedure Laterality Date  . COLONOSCOPY W/ POLYPECTOMY  [phone]   last  colonoscopy 2007, Dr Carlean Purl  . ESOPHAGOGASTRODUODENOSCOPY    . SPINE SURGERY     T9-L5 fusions    There were no vitals filed for this visit.  Subjective Assessment - 05/19/19 1058    Subjective  had to take rescue inhaler this morning, may be due to sweeping garage yesterday. knees a little achy on elliptical and going up stairs. uncomfortable rather than pain.    Pain Score  0-No pain                       [facility] Adult PT Treatment/Exercise - 05/19/19 0001      Lumbar Exercises: Aerobic   Elliptical  I 7 R 7 32fwd/3bckwd      Lumbar Exercises:  Machines for Strengthening   Cybex Lumbar Extension  black tband flex/ext 2 sets of 10      Lumbar Exercises: Standing   Other Standing Lumbar Exercises  AR press and stirs BIL 2x5    Other Standing Lumbar Exercises  5# 1x12; 5 reps no weight focuing on form.      Lumbar Exercises: Seated   Other Seated Lumbar Exercises  Physio ball exercises single leg LLE focus, and bridges   pt able to maintain balance on LLE while sitting now     Lumbar Exercises: Quadruped   Other Quadruped Lumbar Exercises  Birddog alternating arms legs 2x10               PT Short Term Goals - 04/21/19 1119      PT SHORT TERM GOAL #1   Title  independent with initial HEP    Status  Achieved        PT Long Term Goals - 05/19/19 1104      PT LONG TERM GOAL #4   Title  increase strength of hips to 4/5    Baseline  4+/5    Status  Achieved            Plan - 05/19/19 1139    Clinical Impression Statement  pt. tolerated treatment well with no reports of pain. pt. making progres toward all goals, completing one of the long term goals assessing hip strength. Pt. limited by postural strength and righting responses for balance. pt. will continue to benefit from PT to further improve hip, postural strength, balance, and core stability    PT Treatment/Interventions  ADLs/Self Care Home Management;Cryotherapy;Electrical Stimulation;Moist Heat;Therapeutic exercise;Balance training;Neuromuscular re-education;Stair training;Functional mobility training;Therapeutic activities;Patient/family education;Manual techniques    PT Next Visit Plan  continue focusing on exercises emphasizing postural control and core stabilization       Patient will benefit from skilled therapeutic intervention in order to improve the following deficits and impairments:  Abnormal gait, Improper body mechanics, Pain, Postural dysfunction, Increased muscle spasms, Decreased mobility, Cardiopulmonary status limiting activity, Decreased  activity tolerance, Decreased endurance, Decreased range of motion, Decreased strength, Impaired flexibility, Difficulty walking, Decreased balance  Visit Diagnosis: Muscle weakness (generalized)  Abnormal posture  Difficulty in walking, not elsewhere classified     Problem List Patient Active Problem List   Diagnosis Date Noted  . Preventative health care 03/29/2019  . Pneumonia of left lower lobe due to infectious organism ([place]) 09/01/2018  . Vaginal atrophy 03/24/2018  . Seasonal allergies 02/08/2018  . Erythema nodosum 06/09/2016  . Abscess of groin, left 11/10/2014  . Abscess of left groin 11/10/2014  . Cellulitis 10/20/2014  . Herpes zoster 09/23/2013  . Superficial bruising of finger 02/16/2013  . Hyperlipidemia 11/01/2011  . Palpitations 05/23/2011  . Essential hypertension 04/23/2011  . Cough 04/09/2011  . THYROID FUNCTION TEST, ABNORMAL 12/20/2009  . COLONIC POLYPS, HX OF 11/08/2008  . Asthma 01/21/2008  . GERD 01/21/2008  . IRRITABLE BOWEL SYNDROME 01/21/2008  . FIBROMYALGIA 01/21/2008  . DEFICIENCY, VITAMIN D NOS 04/02/2007  . DEGENERATIVE DISC DISEASE 04/02/2007  . GLAUCOMA, LOW TENSION 02/06/2007  . Osteoporosis 02/06/2007    Darcella Cheshire, SPTA 05/19/2019, 11:43 AM  [place], [place], [address] Phone: [phone]   Fax:  [phone]  Name: Kayla [name] MRN: [id number] Date of Birth: May 08, 1946

## 2019-05-19 NOTE — Telephone Encounter (Signed)
I would need to see her (in office or telehealth) and discuss this and then make recommendations  Thanks

## 2019-05-19 NOTE — Telephone Encounter (Signed)
Please recommend Sir, thank you.

## 2019-05-20 NOTE — Telephone Encounter (Signed)
Patient has booked an appointment for 06/02/2019 at 3:10pm to come in and discuss her medicines and symptoms.

## 2019-05-21 ENCOUNTER — Ambulatory Visit: Payer: Medicare Other | Admitting: Physical Therapy

## 2019-05-25 ENCOUNTER — Encounter: Payer: Self-pay | Admitting: Family Medicine

## 2019-05-26 ENCOUNTER — Ambulatory Visit: Payer: Medicare Other | Admitting: Physical Therapy

## 2019-05-26 ENCOUNTER — Encounter: Payer: Self-pay | Admitting: Physical Therapy

## 2019-05-26 ENCOUNTER — Other Ambulatory Visit: Payer: Self-pay

## 2019-05-26 ENCOUNTER — Encounter: Payer: Self-pay | Admitting: Family Medicine

## 2019-05-26 DIAGNOSIS — R293 Abnormal posture: Secondary | ICD-10-CM | POA: Diagnosis not present

## 2019-05-26 DIAGNOSIS — R262 Difficulty in walking, not elsewhere classified: Secondary | ICD-10-CM | POA: Diagnosis not present

## 2019-05-26 DIAGNOSIS — M62830 Muscle spasm of back: Secondary | ICD-10-CM | POA: Diagnosis not present

## 2019-05-26 DIAGNOSIS — M6281 Muscle weakness (generalized): Secondary | ICD-10-CM | POA: Diagnosis not present

## 2019-05-26 DIAGNOSIS — M545 Low back pain: Secondary | ICD-10-CM | POA: Diagnosis not present

## 2019-05-26 NOTE — Therapy (Signed)
[place], [place], [address] Phone: [phone]   Fax:  [phone]  Physical Therapy Treatment  Patient Details  Name: Kayla [name] MRN: [id number] Date of Birth: 06/07/1946 Referring Provider (PT): Etter Sjogren   Encounter Date: 05/26/2019  PT End of Session - 05/26/19 1141    Visit Number  11    Date for PT Re-Evaluation  06/13/19    PT Start Time  1058    PT Stop Time  1144    PT Time Calculation (min)  46 min    Activity Tolerance  Patient tolerated treatment well    Behavior During Therapy  [facility] for tasks assessed/performed       Past Medical History:  Diagnosis Date  . Asthma   . CAP (community acquired pneumonia) 08/2012   Dr Birdie Riddle  . Diverticulitis   . Diverticulosis   . Fasting hyperglycemia 04/2011   FBS 108  . Fibromyalgia   . Fundic gland polyps of stomach, benign   . GERD (gastroesophageal reflux disease)    gastric polyp x3  . Glaucoma     Dr Bing Plume  . IBS (irritable bowel syndrome)   . Osteopenia    last 07/2011  . Pneumonia     OP as child  . Shingles     Past Surgical History:  Procedure Laterality Date  . COLONOSCOPY W/ POLYPECTOMY  [phone]   last  colonoscopy 2007, Dr Carlean Purl  . ESOPHAGOGASTRODUODENOSCOPY    . SPINE SURGERY     T9-L5 fusions    There were no vitals filed for this visit.  Subjective Assessment - 05/26/19 1100    Subjective  feeling fine, knees dont hurt today. used rescue inhaler today painted yesterday    Currently in Pain?  No/denies                       [facility] Adult PT Treatment/Exercise - 05/26/19 0001      Lumbar Exercises: Aerobic   Elliptical  I 7 R 7 55fwd/3bckwd      Lumbar Exercises: Machines for Strengthening   Cybex Lumbar Extension  black tband flex/ext 2 sets of 10      Lumbar Exercises: Standing   Other Standing Lumbar Exercises  5# AR press BIL 2x5   attempted 10# too heavy    Other  Standing Lumbar Exercises  deadlift 5# 2x10      Lumbar Exercises: Seated   Other Seated Lumbar Exercises  Physio ball exercises single leg LLE focus, and bridges 2x10      Lumbar Exercises: Quadruped   Other Quadruped Lumbar Exercises  Birddog alternating arms legs 2x10   2# cuffs on arm and legs     Knee/Hip Exercises: Seated   Sit to Sand  2 sets;10 reps   Weighted ball chest press              PT Short Term Goals - 04/21/19 1119      PT SHORT TERM GOAL #1   Title  independent with initial HEP    Status  Achieved        PT Long Term Goals - 05/19/19 1104      PT LONG TERM GOAL #4   Title  increase strength of hips to 4/5    Baseline  4+/5    Status  Achieved            Plan - 05/26/19 1142  Clinical Impression Statement  pt tolerated treatment well and completed all exercises with no reports of pain. Pt able to maintain balance on BIL LE for equal periods of time. pt is improving upon righting responses and being concious of postrual deviations present during exercises. Tactile cues required to guid pt. for proper form for deadlifts preventing knees over toes. Pt will continue to benefit from PT to imprrove postural strength and proper body mechnaics.    PT Treatment/Interventions  ADLs/Self Care Home Management;Cryotherapy;Electrical Stimulation;Moist Heat;Therapeutic exercise;Balance training;Neuromuscular re-education;Stair training;Functional mobility training;Therapeutic activities;Patient/family education;Manual techniques       Patient will benefit from skilled therapeutic intervention in order to improve the following deficits and impairments:  Abnormal gait, Improper body mechanics, Pain, Postural dysfunction, Increased muscle spasms, Decreased mobility, Cardiopulmonary status limiting activity, Decreased activity tolerance, Decreased endurance, Decreased range of motion, Decreased strength, Impaired flexibility, Difficulty walking, Decreased  balance  Visit Diagnosis: Muscle weakness (generalized)  Abnormal posture  Difficulty in walking, not elsewhere classified     Problem List Patient Active Problem List   Diagnosis Date Noted  . Preventative health care 03/29/2019  . Pneumonia of left lower lobe due to infectious organism ([place]) 09/01/2018  . Vaginal atrophy 03/24/2018  . Seasonal allergies 02/08/2018  . Erythema nodosum 06/09/2016  . Abscess of groin, left 11/10/2014  . Abscess of left groin 11/10/2014  . Cellulitis 10/20/2014  . Herpes zoster 09/23/2013  . Superficial bruising of finger 02/16/2013  . Hyperlipidemia 11/01/2011  . Palpitations 05/23/2011  . Essential hypertension 04/23/2011  . Cough 04/09/2011  . THYROID FUNCTION TEST, ABNORMAL 12/20/2009  . COLONIC POLYPS, HX OF 11/08/2008  . Asthma 01/21/2008  . GERD 01/21/2008  . IRRITABLE BOWEL SYNDROME 01/21/2008  . FIBROMYALGIA 01/21/2008  . DEFICIENCY, VITAMIN D NOS 04/02/2007  . DEGENERATIVE DISC DISEASE 04/02/2007  . GLAUCOMA, LOW TENSION 02/06/2007  . Osteoporosis 02/06/2007    Darcella Cheshire, SPTA 05/26/2019, 11:46 AM  [place], [place], [address] Phone: [phone]   Fax:  [phone]  Name: Kayla [name] MRN: [id number] Date of Birth: 1945-12-30

## 2019-05-28 ENCOUNTER — Other Ambulatory Visit: Payer: Self-pay

## 2019-05-28 ENCOUNTER — Ambulatory Visit: Payer: Medicare Other | Admitting: Physical Therapy

## 2019-05-28 ENCOUNTER — Encounter: Payer: Self-pay | Admitting: Physical Therapy

## 2019-05-28 DIAGNOSIS — R293 Abnormal posture: Secondary | ICD-10-CM

## 2019-05-28 DIAGNOSIS — M545 Low back pain, unspecified: Secondary | ICD-10-CM

## 2019-05-28 DIAGNOSIS — M6281 Muscle weakness (generalized): Secondary | ICD-10-CM | POA: Diagnosis not present

## 2019-05-28 DIAGNOSIS — R262 Difficulty in walking, not elsewhere classified: Secondary | ICD-10-CM

## 2019-05-28 DIAGNOSIS — M62830 Muscle spasm of back: Secondary | ICD-10-CM

## 2019-05-28 NOTE — Therapy (Signed)
[place], [place], [address] Phone: [phone]   Fax:  [phone]  Physical Therapy Treatment  Patient Details  Name: Kayla [name] MRN: [id number] Date of Birth: 1946/07/02 Referring Provider (PT): Etter Sjogren   Encounter Date: 05/28/2019  PT End of Session - 05/28/19 1153    Visit Number  12    Date for PT Re-Evaluation  06/13/19    PT Start Time  1050    PT Stop Time  1150    PT Time Calculation (min)  60 min    Activity Tolerance  Patient tolerated treatment well    Behavior During Therapy  [facility] for tasks assessed/performed       Past Medical History:  Diagnosis Date  . Asthma   . CAP (community acquired pneumonia) 08/2012   Dr Birdie Riddle  . Diverticulitis   . Diverticulosis   . Fasting hyperglycemia 04/2011   FBS 108  . Fibromyalgia   . Fundic gland polyps of stomach, benign   . GERD (gastroesophageal reflux disease)    gastric polyp x3  . Glaucoma     Dr Bing Plume  . IBS (irritable bowel syndrome)   . Osteopenia    last 07/2011  . Pneumonia     OP as child  . Shingles     Past Surgical History:  Procedure Laterality Date  . COLONOSCOPY W/ POLYPECTOMY  [phone]   last  colonoscopy 2007, Dr Carlean Purl  . ESOPHAGOGASTRODUODENOSCOPY    . SPINE SURGERY     T9-L5 fusions    There were no vitals filed for this visit.  Subjective Assessment - 05/28/19 1051    Subjective  Had some knee pain and soreness but feeling better now    Currently in Pain?  No/denies                       [facility] Adult PT Treatment/Exercise - 05/28/19 0001      Lumbar Exercises: Aerobic   Elliptical  I 9 R 5 78fwd/3bckwd      Lumbar Exercises: Machines for Strengthening   Cybex Knee Extension  15# x10, 10# x 10    Cybex Knee Flexion  25# 2x10    Other Lumbar Machine Exercise  15# rows and 20# lats 2x10 each    Other Lumbar Machine Exercise  9# farmers carry,      Lumbar Exercises:  Standing   Other Standing Lumbar Exercises  20# straight arm pull down      Lumbar Exercises: Supine   Pelvic Tilt  15 reps    Dead Bug  1 second;20 reps    Straight Leg Raise  1 second;20 reps    Other Supine Lumbar Exercises  feet on ball small bridges and isometric abs      Lumbar Exercises: Quadruped   Opposite Arm/Leg Raise  20 reps;1 second    Opposite Arm/Leg Raise Limitations  cue sto decrease roll of the lumbar spine      Knee/Hip Exercises: Standing   Hip Flexion  Both;2 sets;10 reps    Hip Flexion Limitations  2.5#    Hip Abduction  Both;2 sets;10 reps    Abduction Limitations  2.5#    Hip Extension  Both;2 sets;10 reps    Extension Limitations  2.5#               PT Short Term Goals - 04/21/19 1119      PT SHORT TERM  GOAL #1   Title  independent with initial HEP    Status  Achieved        PT Long Term Goals - 05/28/19 1155      PT LONG TERM GOAL #1   Title  be safe with a return to the gym    Status  On-going      PT LONG TERM GOAL #2   Title  decrease pain 50%    Status  Achieved      PT LONG TERM GOAL #3   Title  increase lumbar ROM 25%    Status  Achieved            Plan - 05/28/19 1154    Clinical Impression Statement  Spent time on the core today and really educated her on after PT activites, she did have difficulty at first with the PPT and then holding with leg raise and the dead bug, she could do it but every now and then would lose the tilt.  She is starting to think about the gym but does have concerns regarding covid    PT Next Visit Plan  continue focusing on exercises emphasizing postural control and core stabilization    Consulted and Agree with Plan of Care  Patient       Patient will benefit from skilled therapeutic intervention in order to improve the following deficits and impairments:  Abnormal gait, Improper body mechanics, Pain, Postural dysfunction, Increased muscle spasms, Decreased mobility, Cardiopulmonary status  limiting activity, Decreased activity tolerance, Decreased endurance, Decreased range of motion, Decreased strength, Impaired flexibility, Difficulty walking, Decreased balance  Visit Diagnosis: Abnormal posture  Muscle weakness (generalized)  Difficulty in walking, not elsewhere classified  Acute bilateral low back pain without sciatica  Muscle spasm of back     Problem List Patient Active Problem List   Diagnosis Date Noted  . Preventative health care 03/29/2019  . Pneumonia of left lower lobe due to infectious organism ([place]) 09/01/2018  . Vaginal atrophy 03/24/2018  . Seasonal allergies 02/08/2018  . Erythema nodosum 06/09/2016  . Abscess of groin, left 11/10/2014  . Abscess of left groin 11/10/2014  . Cellulitis 10/20/2014  . Herpes zoster 09/23/2013  . Superficial bruising of finger 02/16/2013  . Hyperlipidemia 11/01/2011  . Palpitations 05/23/2011  . Essential hypertension 04/23/2011  . Cough 04/09/2011  . THYROID FUNCTION TEST, ABNORMAL 12/20/2009  . COLONIC POLYPS, HX OF 11/08/2008  . Asthma 01/21/2008  . GERD 01/21/2008  . IRRITABLE BOWEL SYNDROME 01/21/2008  . FIBROMYALGIA 01/21/2008  . DEFICIENCY, VITAMIN D NOS 04/02/2007  . DEGENERATIVE DISC DISEASE 04/02/2007  . GLAUCOMA, LOW TENSION 02/06/2007  . Osteoporosis 02/06/2007    Sumner Boast., PT 05/28/2019, 11:56 AM  [place], [place], [address] Phone: [phone]   Fax:  [phone]  Name: EDWIN LOISELLE MRN: [id number] Date of Birth: [birth year]

## 2019-06-02 ENCOUNTER — Encounter: Payer: Self-pay | Admitting: Internal Medicine

## 2019-06-02 ENCOUNTER — Ambulatory Visit: Payer: Medicare Other | Admitting: Physical Therapy

## 2019-06-02 ENCOUNTER — Ambulatory Visit (INDEPENDENT_AMBULATORY_CARE_PROVIDER_SITE_OTHER): Payer: Medicare Other | Admitting: Internal Medicine

## 2019-06-02 ENCOUNTER — Other Ambulatory Visit: Payer: Self-pay

## 2019-06-02 VITALS — BP 134/70 | HR 58 | Temp 96.8°F | Ht 61.0 in | Wt 156.0 lb

## 2019-06-02 DIAGNOSIS — M545 Low back pain: Secondary | ICD-10-CM | POA: Diagnosis not present

## 2019-06-02 DIAGNOSIS — K219 Gastro-esophageal reflux disease without esophagitis: Secondary | ICD-10-CM | POA: Diagnosis not present

## 2019-06-02 DIAGNOSIS — M62830 Muscle spasm of back: Secondary | ICD-10-CM | POA: Diagnosis not present

## 2019-06-02 DIAGNOSIS — R293 Abnormal posture: Secondary | ICD-10-CM | POA: Diagnosis not present

## 2019-06-02 DIAGNOSIS — K588 Other irritable bowel syndrome: Secondary | ICD-10-CM | POA: Diagnosis not present

## 2019-06-02 DIAGNOSIS — J4520 Mild intermittent asthma, uncomplicated: Secondary | ICD-10-CM | POA: Diagnosis not present

## 2019-06-02 DIAGNOSIS — M6281 Muscle weakness (generalized): Secondary | ICD-10-CM

## 2019-06-02 DIAGNOSIS — R262 Difficulty in walking, not elsewhere classified: Secondary | ICD-10-CM

## 2019-06-02 MED ORDER — HYOSCYAMINE SULFATE 0.125 MG SL SUBL: 0.1250 mg | SUBLINGUAL_TABLET | Freq: Four times a day (QID) | SUBLINGUAL | 1 refills | Status: DC | PRN

## 2019-06-02 MED ORDER — OMEPRAZOLE 20 MG PO CPDR: 20.0000 mg | DELAYED_RELEASE_CAPSULE | Freq: Every day | ORAL | 0 refills | Status: DC

## 2019-06-02 NOTE — Patient Instructions (Signed)
To try to reduce and come off omeprazole (if you can) do the following:  Take 40/20 mg dose alternating daily x 2 weeks  Then go to 20 mg daily x 2 weeks  Then try 20 mg every other day x 2 weeks and use antacids like Mylanta or Gaviscon or Tums as needed  Also can take the 10 or 20 mg Pepcid as needed   Mucinex as needed for the phlegm   I appreciate the opportunity to care for you.  Message me if needed.  Emmit Alexanders!  Gatha Mayer, MD, [facility]

## 2019-06-02 NOTE — Therapy (Signed)
[place], [place], [address] Phone: [phone]   Fax:  [phone]  Physical Therapy Treatment  Patient Details  Name: Kayla [name] MRN: [id number] Date of Birth: March 19, 1946 Referring Provider (PT): Etter Sjogren   Encounter Date: 06/02/2019  PT End of Session - 06/02/19 1120    Visit Number  13    Date for PT Re-Evaluation  06/13/19    PT Start Time  1055    PT Stop Time  1142    PT Time Calculation (min)  47 min       Past Medical History:  Diagnosis Date  . Asthma   . CAP (community acquired pneumonia) 08/2012   Dr Birdie Riddle  . Diverticulitis   . Diverticulosis   . Fasting hyperglycemia 04/2011   FBS 108  . Fibromyalgia   . Fundic gland polyps of stomach, benign   . GERD (gastroesophageal reflux disease)    gastric polyp x3  . Glaucoma     Dr Bing Plume  . IBS (irritable bowel syndrome)   . Osteopenia    last 07/2011  . Pneumonia     OP as child  . Shingles     Past Surgical History:  Procedure Laterality Date  . COLONOSCOPY W/ POLYPECTOMY  [phone]   last  colonoscopy 2007, Dr Carlean Purl  . ESOPHAGOGASTRODUODENOSCOPY    . SPINE SURGERY     T9-L5 fusions    There were no vitals filed for this visit.  Subjective Assessment - 06/02/19 1115    Subjective  feeling good, still checking into [facility] as I think PT ends next week    Currently in Pain?  No/denies                       [facility] Adult PT Treatment/Exercise - 06/02/19 0001      Ambulation/Gait   Gait Comments  amb 5 min even and uneven terrain and able to maintain upright posture      Lumbar Exercises: Aerobic   Elliptical  I 9 R 5 25fd/3bckwd      Lumbar Exercises: Standing   Row  Strengthening;Power tower;20 reps   10# each pulley with squat   Other Standing Lumbar Exercises  2# 4 pt rhy stab on walll 10 reps each    Other Standing Lumbar Exercises  2# postural wall angles 10 times with cuing       Knee/Hip Exercises: Standing   Lateral Step Up  Both;10 reps;Hand Hold: 2;Step Height: 6"   opp leg abd   Forward Step Up  Both;10 reps;Hand Hold: 2;Step Height: 6"   opp leg ext   Walking with Sports Cord  30# 5 times 4 way   min A with LOB              PT Short Term Goals - 04/21/19 1119      PT SHORT TERM GOAL #1   Title  independent with initial HEP    Status  Achieved        PT Long Term Goals - 06/02/19 1116      PT LONG TERM GOAL #1   Title  be safe with a return to the gym    Status  Partially Met      PT LONG TERM GOAL #2   Title  decrease pain 50%    Status  Achieved      PT LONG TERM GOAL #3  Title  increase lumbar ROM 25%    Status  Achieved      PT LONG TERM GOAL #4   Title  increase strength of hips to 4/5    Status  Achieved      PT LONG TERM GOAL #5   Title  walk holding good posture for 5 minutes    Status  Achieved            Plan - 06/02/19 1120    Clinical Impression Statement  all goal met except return to gym and pt is working on it and if not comfortable working on [employer]. pt much more aware thru sessionto engage core and we discussed with most exercises    PT Treatment/Interventions  ADLs/Self Care Home Management;Cryotherapy;Electrical Stimulation;Moist Heat;Therapeutic exercise;Balance training;Neuromuscular re-education;Stair training;Functional mobility training;Therapeutic activities;Patient/family education;Manual techniques    PT Next Visit Plan  continue focusing on exercises emphasizing postural control and core stabilization.D/C next week       Patient will benefit from skilled therapeutic intervention in order to improve the following deficits and impairments:  Abnormal gait, Improper body mechanics, Pain, Postural dysfunction, Increased muscle spasms, Decreased mobility, Cardiopulmonary status limiting activity, Decreased activity tolerance, Decreased endurance, Decreased range of motion, Decreased strength,  Impaired flexibility, Difficulty walking, Decreased balance  Visit Diagnosis: Muscle weakness (generalized)  Abnormal posture  Difficulty in walking, not elsewhere classified     Problem List Patient Active Problem List   Diagnosis Date Noted  . Preventative health care 03/29/2019  . Pneumonia of left lower lobe due to infectious organism ([place]) 09/01/2018  . Vaginal atrophy 03/24/2018  . Seasonal allergies 02/08/2018  . Erythema nodosum 06/09/2016  . Abscess of groin, left 11/10/2014  . Abscess of left groin 11/10/2014  . Cellulitis 10/20/2014  . Herpes zoster 09/23/2013  . Superficial bruising of finger 02/16/2013  . Hyperlipidemia 11/01/2011  . Palpitations 05/23/2011  . Essential hypertension 04/23/2011  . Cough 04/09/2011  . THYROID FUNCTION TEST, ABNORMAL 12/20/2009  . COLONIC POLYPS, HX OF 11/08/2008  . Asthma 01/21/2008  . GERD 01/21/2008  . IRRITABLE BOWEL SYNDROME 01/21/2008  . FIBROMYALGIA 01/21/2008  . DEFICIENCY, VITAMIN D NOS 04/02/2007  . DEGENERATIVE DISC DISEASE 04/02/2007  . GLAUCOMA, LOW TENSION 02/06/2007  . Osteoporosis 02/06/2007    Daphna Lafuente,[name] PTA 06/02/2019, 11:37 AM  [place], [place], [address] Phone: [phone]   Fax:  [phone]  Name: Kayla [name] MRN: [id number] Date of Birth: 05-14-1946

## 2019-06-02 NOTE — Progress Notes (Signed)
Kayla [name] 73 y.o. 1945-12-31 [id number]  Assessment & Plan:   Encounter Diagnoses  Name Primary?  . Gastroesophageal reflux disease, esophagitis presence not specified Yes  . Mild intermittent asthma without complication   . Other irritable bowel syndrome     I think that her need to expectorate etc. is probably her asthma if that.  She says Mucinex helps I think she should use that as needed and then actually see if she can come off the PPI like she did in the past.  She may be able to at least reduce the dose.  I say this not because I have concerns about bone loss or fracture or dementia or kidney failure but gut flora disruption and minimizing acid suppression in a person with all of her IBS symptoms may be helped by reducing or eliminating the chronic acid suppression.  She may needed episodically but it is worth a shot.  I have refilled her hyoscyamine as well.  She may see me as needed.  Subjective:   Chief Complaint:  HPI Since he is here for follow-up, last seen in 2019 with reflux problems.  She was seeing Dr. Cheri Rous in describing episodes where she coughs up some slight phlegm and sometimes feels like she needs to expectorate some phlegm.  She also has some belching episodes.  It was suggested that this might be GERD and she could try twice daily omeprazole.  In talking with her she does not really have heartburn or reflux per se.  She continues to take 10 mg of Pepcid at bedtime with her montelukast as that is a regimen to help her asthma.  No dysphagia or unintentional weight loss.  She says that there are times where she has bloating and gas cramps and hyoscyamine really helps and her prescription is really old and she would like a refill just 10 or so as that would last her a year.  She continues to ingest kombucha and says kefir is also very helpful. Also on Align and believes it is quite helpful.  She also has some epigastric tenderness and pain and xiphoid  pain.  She is going to PT to try to strengthen her core and help with her back.  She does not recall any injury.  She asks about coming off PPI.  She did so a year or so ago and was able to stop it without a taper or problems for a long time but then had some issues and restarted it and has remained on it.  She is never had dysphagia stricture or anything necessarily requiring chronic PPI but has benefited from it. Allergies  Allergen Reactions  . Cephalosporins     Blisters  orally  . Gabapentin     REACTION: rash  . Levofloxacin     REACTION: stomach ache and rash  . Psyllium     REACTION: rash  . Sulfonamide Derivatives Anaphylaxis    REACTION: shock, urticaria  . Aspirin     REACTION: stomach pain  . Belladonna   . Ciprofloxacin Hcl   . Conjugated Estrogens   . Doxycycline     Abdominal pain  . Flovent Hfa [Fluticasone]   . Nabumetone   . Nsaids   . Soybean-Containing Drug Products Other (See Comments)    GI upset  . Zicam Cold Remedy [Erysidoron #1]   . Zocor [Simvastatin] Itching  . Prednisone Rash    NO PROBLEM WITH MEDROL DOSE PAK Oral prednisone caused facial burning, "made my  face feel like it was on fire"   Current Meds  Medication Sig  . acetaminophen (TYLENOL) 500 MG tablet Take 500 mg by mouth as needed.  Marland Kitchen albuterol (PROVENTIL) (2.5 MG/3ML) 0.083% nebulizer solution Take 3 mLs (2.5 mg total) by nebulization every 6 (six) hours as needed for wheezing or shortness of breath.  Marland Kitchen albuterol (VENTOLIN HFA) 108 (90 Base) MCG/ACT inhaler Inhale 2 puffs into the lungs every 6 (six) hours as needed for wheezing or shortness of breath. As needed only  . amLODipine (NORVASC) 2.5 MG tablet TAKE 1 TABLET(2.5 MG) BY MOUTH DAILY  . bifidobacterium infantis (ALIGN) capsule Take 1 capsule by mouth daily.  . carboxymethylcellulose (REFRESH PLUS) 0.5 % SOLN 1 drop 3 (three) times daily as needed.  . cholecalciferol (VITAMIN D) 1000 units tablet Take 1 capsule by mouth daily.  Marland Kitchen  EPINEPHrine (EPIPEN 2-PAK) 0.3 mg/0.3 mL IJ SOAJ injection As directed  . famotidine (PEPCID) 10 MG tablet Take 10 mg by mouth at bedtime. Alternating with 20mg  tablets she had  . fluticasone (FLONASE) 50 MCG/ACT nasal spray USE 2 SPRAYS IN EACH  NOSTRIL ONCE A DAY  . GuaiFENesin (MUCINEX PO) Take by mouth as needed. Reported on 03/15/2016  . latanoprost (XALATAN) 0.005 % ophthalmic solution Place 1 drop into both eyes at bedtime.  . metoprolol tartrate (LOPRESSOR) 25 MG tablet 1 po bid  . montelukast (SINGULAIR) 10 MG tablet TAKE 1 TABLET(10 MG) BY MOUTH DAILY  . Multiple Minerals-Vitamins (CALCIUM CITRATE PLUS/MAGNESIUM PO) Take by mouth.  . nystatin cream (MYCOSTATIN) Apply 1 application topically 2 (two) times daily.  . Omega-3 Fatty Acids (FISH OIL PO) Take by mouth.  Marland Kitchen omeprazole (PRILOSEC) 40 MG capsule TAKE 1 CAPSULE BY MOUTH  DAILY  . Polyethyl Glycol-Propyl Glycol (SYSTANE) 0.4-0.3 % GEL ophthalmic gel Place 1 application into both eyes.  . sodium chloride (OCEAN) 0.65 % SOLN nasal spray Place 1 spray into both nostrils as needed for congestion.  . triamcinolone cream (KENALOG) 0.1 % Apply 1 application topically 2 (two) times daily.  . Vitamin Mixture (ESTER-C PO) Take 1,000 mg by mouth daily.   Past Medical History:  Diagnosis Date  . Asthma   . CAP (community acquired pneumonia) 08/2012   Dr Birdie Riddle  . Diverticulitis   . Diverticulosis   . Fasting hyperglycemia 04/2011   FBS 108  . Fibromyalgia   . Fundic gland polyps of stomach, benign   . GERD (gastroesophageal reflux disease)    gastric polyp x3  . Glaucoma     Dr Bing Plume  . IBS (irritable bowel syndrome)   . Osteopenia    last 07/2011  . Pneumonia     OP as child  . Shingles    Past Surgical History:  Procedure Laterality Date  . COLONOSCOPY W/ POLYPECTOMY  [phone]   last  colonoscopy 2007, Dr Carlean Purl  . ESOPHAGOGASTRODUODENOSCOPY    . SPINE SURGERY     T9-L5 fusions   Social History   Social History  Narrative   Daily caffeine 3 cups   Regular exercise   Married         family history includes Asthma in her paternal aunt; Breast cancer in her maternal aunt, mother, paternal aunt, sister, and another family member; Coronary artery disease in her paternal aunt; Deep vein thrombosis in her son; Heart Problems in her son; Heart attack in her paternal grandmother and paternal uncle; Heart disease in her maternal aunt and paternal grandfather; Heart failure in her father; Kidney  disease in her maternal grandmother; Ovarian cancer in an other family member; Pulmonary embolism in her son.   Review of Systems As per HPI all other review of systems appear negative at this time  Objective:   Physical Exam BP 134/70   Pulse (!) 58   Temp (!) 96.8 F (36 C)   Ht 5\' 1"  (1.549 m)   Wt 156 lb (70.8 kg)   BMI 29.48 kg/m  Overweight well-developed well-nourished no acute distress Eyes anicteric Lungs are clear without wheezes there is good air movement Heart sounds somewhat distant but normal Abdomen is obese soft and nontender though the xiphoid is somewhat tender, there is a negative Carnett's sign

## 2019-06-04 ENCOUNTER — Other Ambulatory Visit: Payer: Self-pay

## 2019-06-04 ENCOUNTER — Ambulatory Visit: Payer: Medicare Other | Admitting: Physical Therapy

## 2019-06-04 DIAGNOSIS — R293 Abnormal posture: Secondary | ICD-10-CM | POA: Diagnosis not present

## 2019-06-04 DIAGNOSIS — M6281 Muscle weakness (generalized): Secondary | ICD-10-CM | POA: Diagnosis not present

## 2019-06-04 DIAGNOSIS — M545 Low back pain: Secondary | ICD-10-CM | POA: Diagnosis not present

## 2019-06-04 DIAGNOSIS — R262 Difficulty in walking, not elsewhere classified: Secondary | ICD-10-CM | POA: Diagnosis not present

## 2019-06-04 DIAGNOSIS — M62830 Muscle spasm of back: Secondary | ICD-10-CM | POA: Diagnosis not present

## 2019-06-04 DIAGNOSIS — J4520 Mild intermittent asthma, uncomplicated: Secondary | ICD-10-CM | POA: Diagnosis not present

## 2019-06-04 NOTE — Therapy (Signed)
[place], [place], [address] Phone: [phone]   Fax:  [phone]  Physical Therapy Treatment  Patient Details  Name: Kayla [name] MRN: [id number] Date of Birth: 1946-01-04 Referring Provider (PT): Etter Sjogren   Encounter Date: 06/04/2019  PT End of Session - 06/04/19 1131    Visit Number  14    Date for PT Re-Evaluation  06/13/19    PT Start Time  1055    PT Stop Time  1135    PT Time Calculation (min)  40 min       Past Medical History:  Diagnosis Date  . Abscess of groin, left 11/10/2014  . Abscess of left groin 11/10/2014  . Asthma   . CAP (community acquired pneumonia) 08/2012   Dr Birdie Riddle  . Cellulitis 10/20/2014  . Diverticulitis   . Diverticulosis   . Fasting hyperglycemia 04/2011   FBS 108  . Fibromyalgia   . Fundic gland polyps of stomach, benign   . GERD (gastroesophageal reflux disease)    gastric polyp x3  . Glaucoma     Dr Bing Plume  . IBS (irritable bowel syndrome)   . Osteopenia    last 07/2011  . Pneumonia     OP as child  . Shingles     Past Surgical History:  Procedure Laterality Date  . COLONOSCOPY W/ POLYPECTOMY  [phone]   last  colonoscopy 2007, Dr Carlean Purl  . ESOPHAGOGASTRODUODENOSCOPY    . SPINE SURGERY     T9-L5 fusions    There were no vitals filed for this visit.  Subjective Assessment - 06/04/19 1106    Subjective  doing well, felt okay after last session    Currently in Pain?  No/denies                       [facility] Adult PT Treatment/Exercise - 06/04/19 0001      Lumbar Exercises: Aerobic   Elliptical  I 9 R 5 27fd/3bckwd    UBE (Upper Arm Bike)  L 3 3 fwd/3back- standing for posture      Lumbar Exercises: Machines for Strengthening   Cybex Lumbar Extension  black tband flex/ext 20    Cybex Knee Extension  15# 2 sets 10    Cybex Knee Flexion  25# 2x15    Other Lumbar Machine Exercise  rows and lats 20# 2 sets 15    Other Lumbar Machine Exercise  9# farmers carry,      Lumbar Exercises: Seated   Sit to Stand  20 reps   wt ball chest press              PT Short Term Goals - 04/21/19 1119      PT SHORT TERM GOAL #1   Title  independent with initial HEP    Status  Achieved        PT Long Term Goals - 06/02/19 1116      PT LONG TERM GOAL #1   Title  be safe with a return to the gym    Status  Partially Met      PT LONG TERM GOAL #2   Title  decrease pain 50%    Status  Achieved      PT LONG TERM GOAL #3   Title  increase lumbar ROM 25%    Status  Achieved      PT LONG TERM GOAL #4   Title  increase strength of hips to 4/5    Status  Achieved      PT LONG TERM GOAL #5   Title  walk holding good posture for 5 minutes    Status  Achieved            Plan - 06/04/19 1131    Clinical Impression Statement  pt doing much better with engaging core with activities , minimal to no cuing needed- self correcting. pt also able to maintain upright posture throughout session    PT Treatment/Interventions  ADLs/Self Care Home Management;Cryotherapy;Electrical Stimulation;Moist Heat;Therapeutic exercise;Balance training;Neuromuscular re-education;Stair training;Functional mobility training;Therapeutic activities;Patient/family education;Manual techniques    PT Next Visit Plan  D/C next week to gym       Patient will benefit from skilled therapeutic intervention in order to improve the following deficits and impairments:  Abnormal gait, Improper body mechanics, Pain, Postural dysfunction, Increased muscle spasms, Decreased mobility, Cardiopulmonary status limiting activity, Decreased activity tolerance, Decreased endurance, Decreased range of motion, Decreased strength, Impaired flexibility, Difficulty walking, Decreased balance  Visit Diagnosis: Muscle weakness (generalized)  Abnormal posture     Problem List Patient Active Problem List   Diagnosis Date Noted  . Preventative  health care 03/29/2019  . Pneumonia of left lower lobe due to infectious organism ([place]) 09/01/2018  . Vaginal atrophy 03/24/2018  . Seasonal allergies 02/08/2018  . Erythema nodosum 06/09/2016  . Herpes zoster 09/23/2013  . Superficial bruising of finger 02/16/2013  . Hyperlipidemia 11/01/2011  . Palpitations 05/23/2011  . Essential hypertension 04/23/2011  . Cough 04/09/2011  . THYROID FUNCTION TEST, ABNORMAL 12/20/2009  . COLONIC POLYPS, HX OF 11/08/2008  . Asthma 01/21/2008  . GERD 01/21/2008  . IRRITABLE BOWEL SYNDROME 01/21/2008  . FIBROMYALGIA 01/21/2008  . DEFICIENCY, VITAMIN D NOS 04/02/2007  . DEGENERATIVE DISC DISEASE 04/02/2007  . GLAUCOMA, LOW TENSION 02/06/2007  . Osteoporosis 02/06/2007    Kristyna Bradstreet,[name] PTA 06/04/2019, 11:33 AM  [place], [place], [address] Phone: [phone]   Fax:  [phone]  Name: Kayla [name] MRN: [id number] Date of Birth: [birth year]

## 2019-06-09 ENCOUNTER — Encounter: Payer: Self-pay | Admitting: Physical Therapy

## 2019-06-09 ENCOUNTER — Other Ambulatory Visit: Payer: Self-pay

## 2019-06-09 ENCOUNTER — Ambulatory Visit: Payer: Medicare Other | Admitting: Physical Therapy

## 2019-06-09 DIAGNOSIS — R262 Difficulty in walking, not elsewhere classified: Secondary | ICD-10-CM | POA: Diagnosis not present

## 2019-06-09 DIAGNOSIS — M545 Low back pain, unspecified: Secondary | ICD-10-CM

## 2019-06-09 DIAGNOSIS — R293 Abnormal posture: Secondary | ICD-10-CM

## 2019-06-09 DIAGNOSIS — M6281 Muscle weakness (generalized): Secondary | ICD-10-CM | POA: Diagnosis not present

## 2019-06-09 DIAGNOSIS — M62830 Muscle spasm of back: Secondary | ICD-10-CM

## 2019-06-09 NOTE — Therapy (Signed)
[place], [place], [address] Phone: [phone]   Fax:  [phone]  Physical Therapy Treatment  Patient Details  Name: Kayla [name] MRN: [id number] Date of Birth: 1946-04-23 Referring Provider (PT): Etter Sjogren   Encounter Date: 06/09/2019  PT End of Session - 06/09/19 1153    Visit Number  15    PT Start Time  1055    PT Stop Time  1145    PT Time Calculation (min)  50 min    Activity Tolerance  Patient tolerated treatment well    Behavior During Therapy  [facility] for tasks assessed/performed       Past Medical History:  Diagnosis Date  . Abscess of groin, left 11/10/2014  . Abscess of left groin 11/10/2014  . Asthma   . CAP (community acquired pneumonia) 08/2012   Dr Birdie Riddle  . Cellulitis 10/20/2014  . Diverticulitis   . Diverticulosis   . Fasting hyperglycemia 04/2011   FBS 108  . Fibromyalgia   . Fundic gland polyps of stomach, benign   . GERD (gastroesophageal reflux disease)    gastric polyp x3  . Glaucoma     Dr Bing Plume  . IBS (irritable bowel syndrome)   . Osteopenia    last 07/2011  . Pneumonia     OP as child  . Shingles     Past Surgical History:  Procedure Laterality Date  . COLONOSCOPY W/ POLYPECTOMY  [phone]   last  colonoscopy 2007, Dr Carlean Purl  . ESOPHAGOGASTRODUODENOSCOPY    . SPINE SURGERY     T9-L5 fusions    There were no vitals filed for this visit.  Subjective Assessment - 06/09/19 1116    Subjective  Feeling pretty good, I have a lot of quesitons    Currently in Pain?  No/denies                       [facility] Adult PT Treatment/Exercise - 06/09/19 0001      Ambulation/Gait   Gait Comments  gait around building 3# biceps and 6# farmers carry             PT Education - 06/09/19 1148    Education Details  Spent a lot of time on HEP, she has a lot of quesitons and at times is too concerned about doing things perfect that she does  not do the exercises, she also needed a lot of verbal and tactile cues for her to remember even with the handouts.    Person(s) Educated  Patient    Methods  Explanation;Demonstration;Tactile cues;Handout;Verbal cues    Comprehension  Verbal cues required;Tactile cues required;Returned demonstration;Verbalized understanding       PT Short Term Goals - 04/21/19 1119      PT SHORT TERM GOAL #1   Title  independent with initial HEP    Status  Achieved        PT Long Term Goals - 06/09/19 1154      PT LONG TERM GOAL #1   Title  be safe with a return to the gym    Status  Achieved      PT LONG TERM GOAL #2   Title  decrease pain 50%    Status  Achieved      PT LONG TERM GOAL #3   Title  increase lumbar ROM 25%    Status  Achieved      PT LONG TERM GOAL #  4   Title  increase strength of hips to 4/5    Status  Achieved      PT LONG TERM GOAL #5   Title  walk holding good posture for 5 minutes    Status  Achieved            Plan - 06/09/19 1153    Clinical Impression Statement  Patient wanted to spend all the time today on knowing what to do after D/C, she had a lot of quesitons about the HEP, exercises and anatomy.  I spent time with her and answered all of her questions.  She has a tendency to bee too focused on being perfect    PT Next Visit Plan  D/C    Consulted and Agree with Plan of Care  Patient       Patient will benefit from skilled therapeutic intervention in order to improve the following deficits and impairments:  Abnormal gait, Improper body mechanics, Pain, Postural dysfunction, Increased muscle spasms, Decreased mobility, Cardiopulmonary status limiting activity, Decreased activity tolerance, Decreased endurance, Decreased range of motion, Decreased strength, Impaired flexibility, Difficulty walking, Decreased balance  Visit Diagnosis: Muscle weakness (generalized)  Abnormal posture  Difficulty in walking, not elsewhere classified  Acute bilateral  low back pain without sciatica  Muscle spasm of back     Problem List Patient Active Problem List   Diagnosis Date Noted  . Preventative health care 03/29/2019  . Pneumonia of left lower lobe due to infectious organism ([place]) 09/01/2018  . Vaginal atrophy 03/24/2018  . Seasonal allergies 02/08/2018  . Erythema nodosum 06/09/2016  . Herpes zoster 09/23/2013  . Superficial bruising of finger 02/16/2013  . Hyperlipidemia 11/01/2011  . Palpitations 05/23/2011  . Essential hypertension 04/23/2011  . Cough 04/09/2011  . THYROID FUNCTION TEST, ABNORMAL 12/20/2009  . COLONIC POLYPS, HX OF 11/08/2008  . Asthma 01/21/2008  . GERD 01/21/2008  . IRRITABLE BOWEL SYNDROME 01/21/2008  . FIBROMYALGIA 01/21/2008  . DEFICIENCY, VITAMIN D NOS 04/02/2007  . DEGENERATIVE DISC DISEASE 04/02/2007  . GLAUCOMA, LOW TENSION 02/06/2007  . Osteoporosis 02/06/2007    Sumner Boast., PT 06/09/2019, 11:56 AM  [place], [place], [address] Phone: [phone]   Fax:  [phone]  Name: SAMAH DIDION MRN: [id number] Date of Birth: 05-16-1946

## 2019-06-16 ENCOUNTER — Ambulatory Visit: Payer: Self-pay | Admitting: *Deleted

## 2019-06-16 ENCOUNTER — Encounter: Payer: Self-pay | Admitting: Family Medicine

## 2019-06-16 NOTE — Telephone Encounter (Signed)
Pt called stating that she had taken her Metoprolol 6 hours earlier. She normally takes it once in the morning and the then in the evenine thought she was taking her omeprazole.  She spoke with her pharmacist first. e thought she was taking her omeprazole.  No nausea, vomiting, feeling faint or like passing out. She checked her pulse and it is between 70 and 61 right now. Advise that if she starts feeling weak and if heart rated drops below 52 to call back. She voiced understanding. Per protocol, her pcp it to give her a call back. Routing to LB at [facility] for review and call back.  Reason for Disposition . [1] DOUBLE DOSE (an extra dose or lesser amount) of prescription drug AND [2] NO symptoms (Exception: a double dose of antibiotics)  Answer Assessment - Initial Assessment Questions 1.   NAME of MEDICATION: "What medicine are you calling about?"     metoprolol 2.   QUESTION: "What is your question?"     Took the medication in 6 hours instead of 12 hours 3.   PRESCRIBING HCP: "Who prescribed it?" Reason: if prescribed by specialist, call should be referred to that group.     Dr. Carollee Herter 4. SYMPTOMS: "Do you have any symptoms?"     none 5. SEVERITY: If symptoms are present, ask "Are they mild, moderate or severe?"     n/a 6.  PREGNANCY:  "Is there any chance that you are pregnant?" "When was your last menstrual period?"     n/a  Protocols used: MEDICATION QUESTION CALL-A-AH

## 2019-06-16 NOTE — Telephone Encounter (Signed)
Periodically check bp and pulse --- if she feels weak or dizzy at any time go to er I can do virtual visit in am

## 2019-06-16 NOTE — Telephone Encounter (Signed)
Notified pt and she voices understanding. She will call tomorrow for a Virtual visit if vitals are lower than usual and/or pt is symptomatic. She voices understanding of symptoms requiring ER visit tonight

## 2019-06-17 ENCOUNTER — Encounter: Payer: Self-pay | Admitting: Family Medicine

## 2019-07-20 ENCOUNTER — Other Ambulatory Visit: Payer: Self-pay

## 2019-07-20 ENCOUNTER — Other Ambulatory Visit: Payer: Medicare Other

## 2019-07-20 ENCOUNTER — Other Ambulatory Visit (INDEPENDENT_AMBULATORY_CARE_PROVIDER_SITE_OTHER): Payer: Medicare Other

## 2019-07-20 DIAGNOSIS — E785 Hyperlipidemia, unspecified: Secondary | ICD-10-CM | POA: Diagnosis not present

## 2019-07-20 LAB — LIPID PANEL
Cholesterol: 221 mg/dL — ABNORMAL HIGH (ref 0–200)
HDL: 65 mg/dL (ref >39.00)
LDL Cholesterol: 137 mg/dL — ABNORMAL HIGH (ref 0–99)
NonHDL: 155.57
Total CHOL/HDL Ratio: 3
Triglycerides: 93 mg/dL (ref 0.0–149.0)
VLDL: 18.6 mg/dL (ref 0.0–40.0)

## 2019-07-20 LAB — COMPREHENSIVE METABOLIC PANEL
ALT: 16 U/L (ref 0–35)
AST: 20 U/L (ref 0–37)
Albumin: 4.6 g/dL (ref 3.5–5.2)
Alkaline Phosphatase: 95 U/L (ref 39–117)
BUN: 18 mg/dL (ref 6–23)
CO2: 29 mEq/L (ref 19–32)
Calcium: 9.6 mg/dL (ref 8.4–10.5)
Chloride: 104 mEq/L (ref 96–112)
Creatinine, Ser: 0.71 mg/dL (ref 0.40–1.20)
GFR: 80.5 mL/min (ref >60.00)
Glucose, Bld: 91 mg/dL (ref 70–99)
Potassium: 5.3 mEq/L — ABNORMAL HIGH (ref 3.5–5.1)
Sodium: 139 mEq/L (ref 135–145)
Total Bilirubin: 0.5 mg/dL (ref 0.2–1.2)
Total Protein: 6.6 g/dL (ref 6.0–8.3)

## 2019-07-24 ENCOUNTER — Telehealth: Payer: Self-pay | Admitting: *Deleted

## 2019-07-24 NOTE — Telephone Encounter (Signed)
Copied from [facility] [phone]. Topic: General - Call Back - No Documentation >> Jul 24, 2019 11:12 AM Erick Blinks wrote: Reason for CRM: Pt is requesting call back to discuss lab results. Pt is requesting call back after 3pm when she returns from errands. Please advise  Best contact: [phone]

## 2019-07-26 ENCOUNTER — Other Ambulatory Visit: Payer: Self-pay | Admitting: Family Medicine

## 2019-07-26 DIAGNOSIS — E875 Hyperkalemia: Secondary | ICD-10-CM

## 2019-07-27 NOTE — Telephone Encounter (Signed)
See labs 

## 2019-07-27 NOTE — Telephone Encounter (Signed)
Left message with gentleman for her to call back.

## 2019-07-28 ENCOUNTER — Telehealth: Payer: Self-pay

## 2019-07-28 NOTE — Telephone Encounter (Signed)
Copied from [place] [id number]. Topic: Quick Communication - Lab Results (Clinic Use ONLY) >> Jul 27, 2019  4:25 PM Doylene Canning, CMA wrote: Called patient to inform them of 07/20/19 lab results. When patient returns call, triage nurse may disclose results.

## 2019-08-04 ENCOUNTER — Other Ambulatory Visit: Payer: Medicare Other

## 2019-08-13 DIAGNOSIS — H04123 Dry eye syndrome of bilateral lacrimal glands: Secondary | ICD-10-CM | POA: Diagnosis not present

## 2019-08-13 DIAGNOSIS — H2513 Age-related nuclear cataract, bilateral: Secondary | ICD-10-CM | POA: Diagnosis not present

## 2019-08-13 DIAGNOSIS — H401131 Primary open-angle glaucoma, bilateral, mild stage: Secondary | ICD-10-CM | POA: Diagnosis not present

## 2019-08-14 ENCOUNTER — Other Ambulatory Visit: Payer: Medicare Other

## 2019-08-21 ENCOUNTER — Other Ambulatory Visit: Payer: Self-pay

## 2019-08-21 ENCOUNTER — Other Ambulatory Visit (INDEPENDENT_AMBULATORY_CARE_PROVIDER_SITE_OTHER): Payer: Medicare Other

## 2019-08-21 DIAGNOSIS — E875 Hyperkalemia: Secondary | ICD-10-CM | POA: Diagnosis not present

## 2019-08-21 LAB — BASIC METABOLIC PANEL
BUN: 16 mg/dL (ref 6–23)
CO2: 28 mEq/L (ref 19–32)
Calcium: 9.2 mg/dL (ref 8.4–10.5)
Chloride: 104 mEq/L (ref 96–112)
Creatinine, Ser: 0.7 mg/dL (ref 0.40–1.20)
GFR: 81.81 mL/min (ref >60.00)
Glucose, Bld: 85 mg/dL (ref 70–99)
Potassium: 5 mEq/L (ref 3.5–5.1)
Sodium: 139 mEq/L (ref 135–145)

## 2019-08-31 ENCOUNTER — Other Ambulatory Visit: Payer: Self-pay | Admitting: Family Medicine

## 2019-08-31 DIAGNOSIS — I10 Essential (primary) hypertension: Secondary | ICD-10-CM

## 2019-10-01 ENCOUNTER — Encounter: Payer: Self-pay | Admitting: Family Medicine

## 2019-10-22 ENCOUNTER — Other Ambulatory Visit: Payer: Self-pay

## 2019-10-22 DIAGNOSIS — I10 Essential (primary) hypertension: Secondary | ICD-10-CM

## 2019-10-22 MED ORDER — METOPROLOL TARTRATE 25 MG PO TABS: ORAL_TABLET | ORAL | 2 refills | Status: DC

## 2019-11-04 ENCOUNTER — Other Ambulatory Visit: Payer: Self-pay | Admitting: *Deleted

## 2019-11-04 MED ORDER — MONTELUKAST SODIUM 10 MG PO TABS: ORAL_TABLET | ORAL | 0 refills | Status: DC

## 2019-11-05 ENCOUNTER — Ambulatory Visit: Payer: Medicare Other | Attending: Internal Medicine

## 2019-11-05 DIAGNOSIS — Z23 Encounter for immunization: Secondary | ICD-10-CM | POA: Insufficient documentation

## 2019-11-05 NOTE — Progress Notes (Signed)
   Covid-19 Vaccination Clinic  Name:  Kayla [name]    MRN: [id number] DOB: Mar 04, 1946  11/05/2019  Ms. [name] was observed post Covid-19 immunization for 30 minutes based on pre-vaccination screening without incidence. She was provided with Vaccine Information Sheet and instruction to access the V-Safe system.   Ms. [name] was instructed to call 911 with any severe reactions post vaccine: Marland Kitchen Difficulty breathing  . Swelling of your face and throat  . A fast heartbeat  . A bad rash all over your body  . Dizziness and weakness    Immunizations Administered    Name Date Dose VIS Date Route   Pfizer COVID-19 Vaccine 11/05/2019 12:07 PM 0.3 mL 08/21/2019 Intramuscular   Manufacturer: [place]   Lot: [id number]   [place]: [id number]

## 2019-11-09 DIAGNOSIS — L821 Other seborrheic keratosis: Secondary | ICD-10-CM | POA: Diagnosis not present

## 2019-11-09 DIAGNOSIS — L918 Other hypertrophic disorders of the skin: Secondary | ICD-10-CM | POA: Diagnosis not present

## 2019-11-09 DIAGNOSIS — L304 Erythema intertrigo: Secondary | ICD-10-CM | POA: Diagnosis not present

## 2019-11-25 ENCOUNTER — Ambulatory Visit: Payer: Medicare Other | Attending: Internal Medicine

## 2019-11-25 DIAGNOSIS — Z23 Encounter for immunization: Secondary | ICD-10-CM

## 2019-11-25 NOTE — Progress Notes (Signed)
   Covid-19 Vaccination Clinic  Name:  Kayla [name]    MRN: [id number] DOB: 19-Dec-1945  11/25/2019  Ms. Lichter was observed post Covid-19 immunization for 30 minutes based on pre-vaccination screening without incident. She was provided with Vaccine Information Sheet and instruction to access the V-Safe system.   Ms. Goos was instructed to call 911 with any severe reactions post vaccine: Marland Kitchen Difficulty breathing  . Swelling of face and throat  . A fast heartbeat  . A bad rash all over body  . Dizziness and weakness   Immunizations Administered    Name Date Dose VIS Date Route   Pfizer COVID-19 Vaccine 11/25/2019  1:18 PM 0.3 mL 08/21/2019 Intramuscular   Manufacturer: [place]   Lot: [id number]   [place]: [id number]

## 2019-12-01 ENCOUNTER — Ambulatory Visit: Payer: Medicare Other | Admitting: Family Medicine

## 2019-12-01 ENCOUNTER — Other Ambulatory Visit: Payer: Self-pay

## 2019-12-02 ENCOUNTER — Ambulatory Visit: Payer: Medicare Other | Admitting: Family Medicine

## 2019-12-04 IMAGING — DX DG CHEST 2V
2 series · 2 of 2 positions shown · non-contrast
Comparison: Chest x-ray of 11/14/2017

CLINICAL DATA: Chest pain and pressure, shortness of breath over
the last 3 days, former smoking history

EXAM:
CHEST - 2 VIEW

[chest pa]
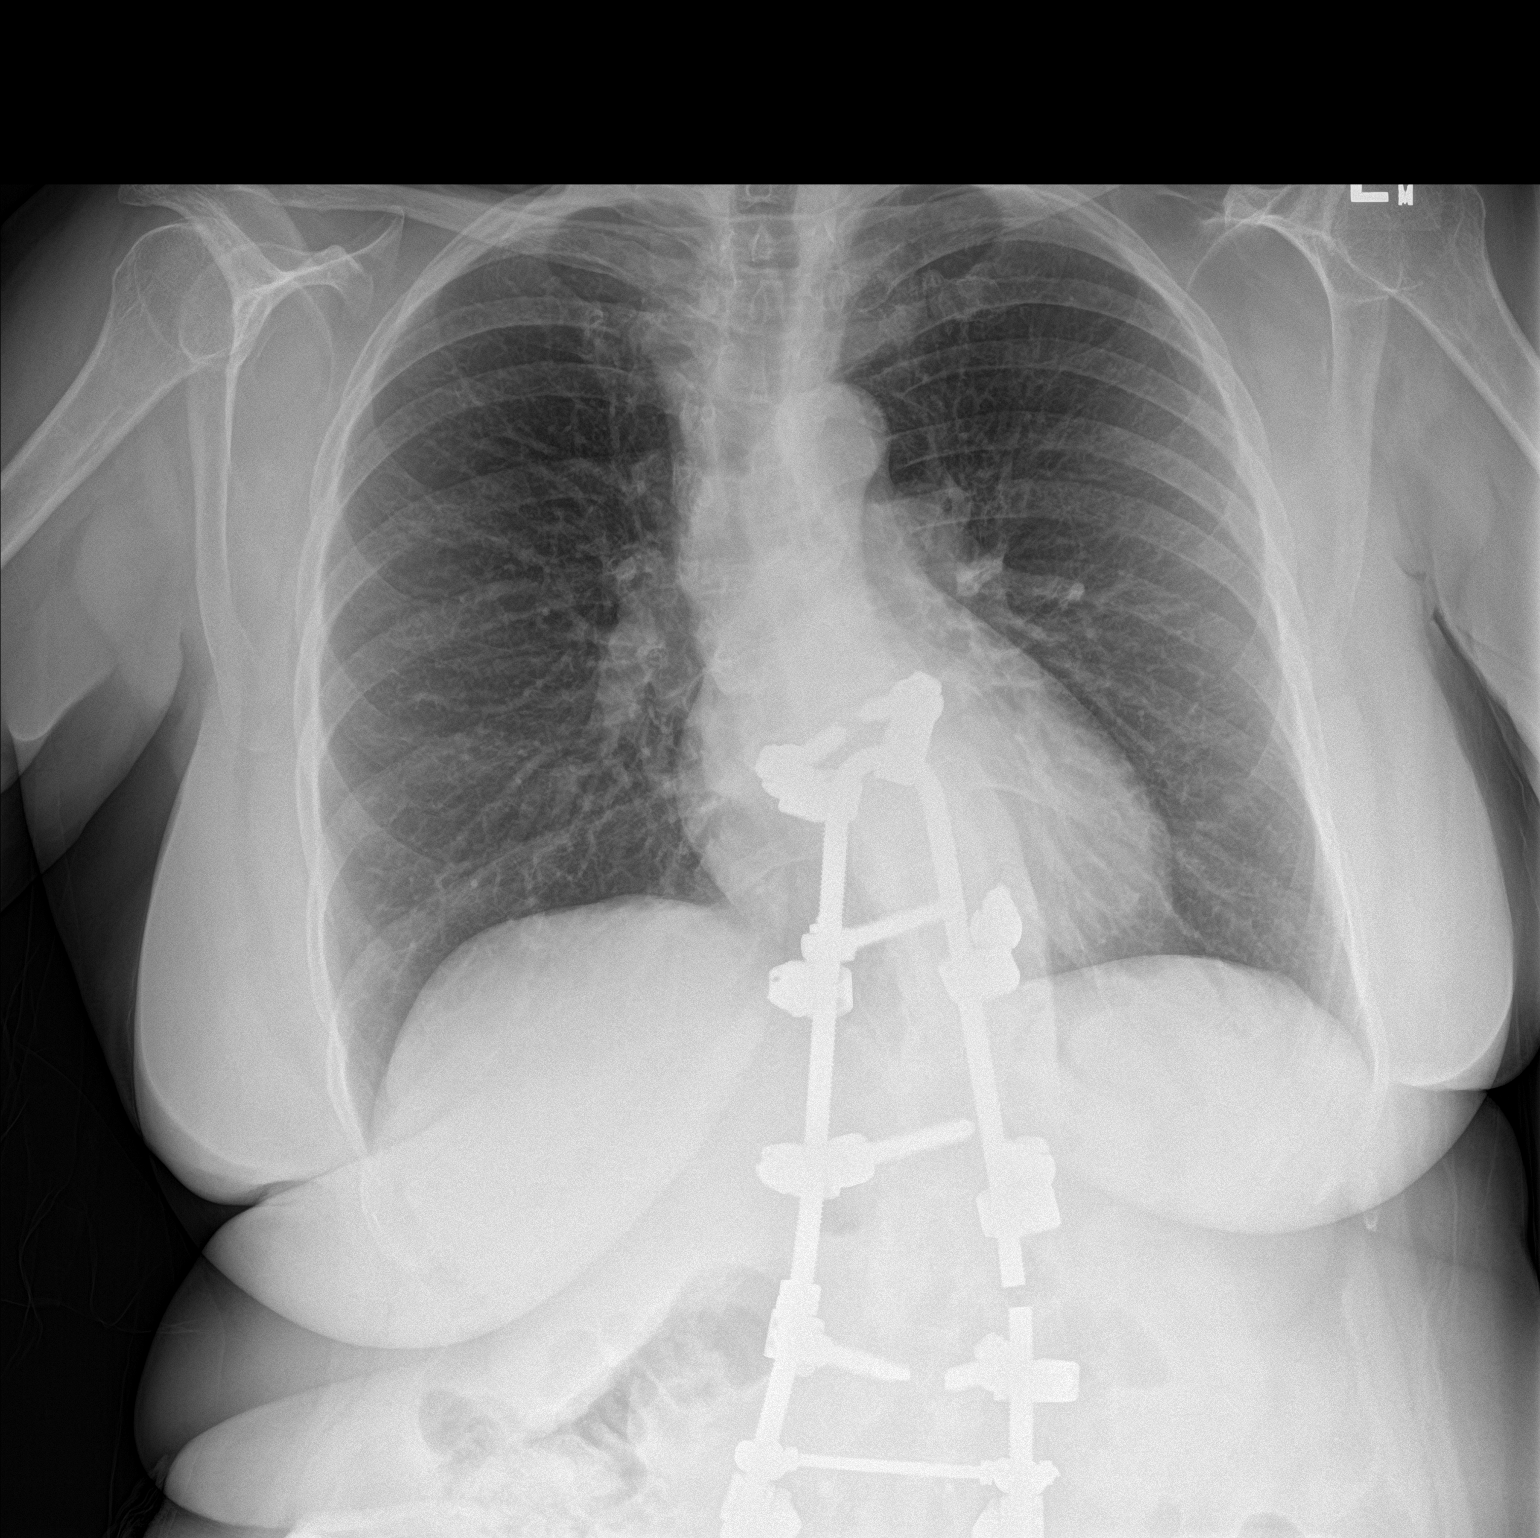

[chest lat]
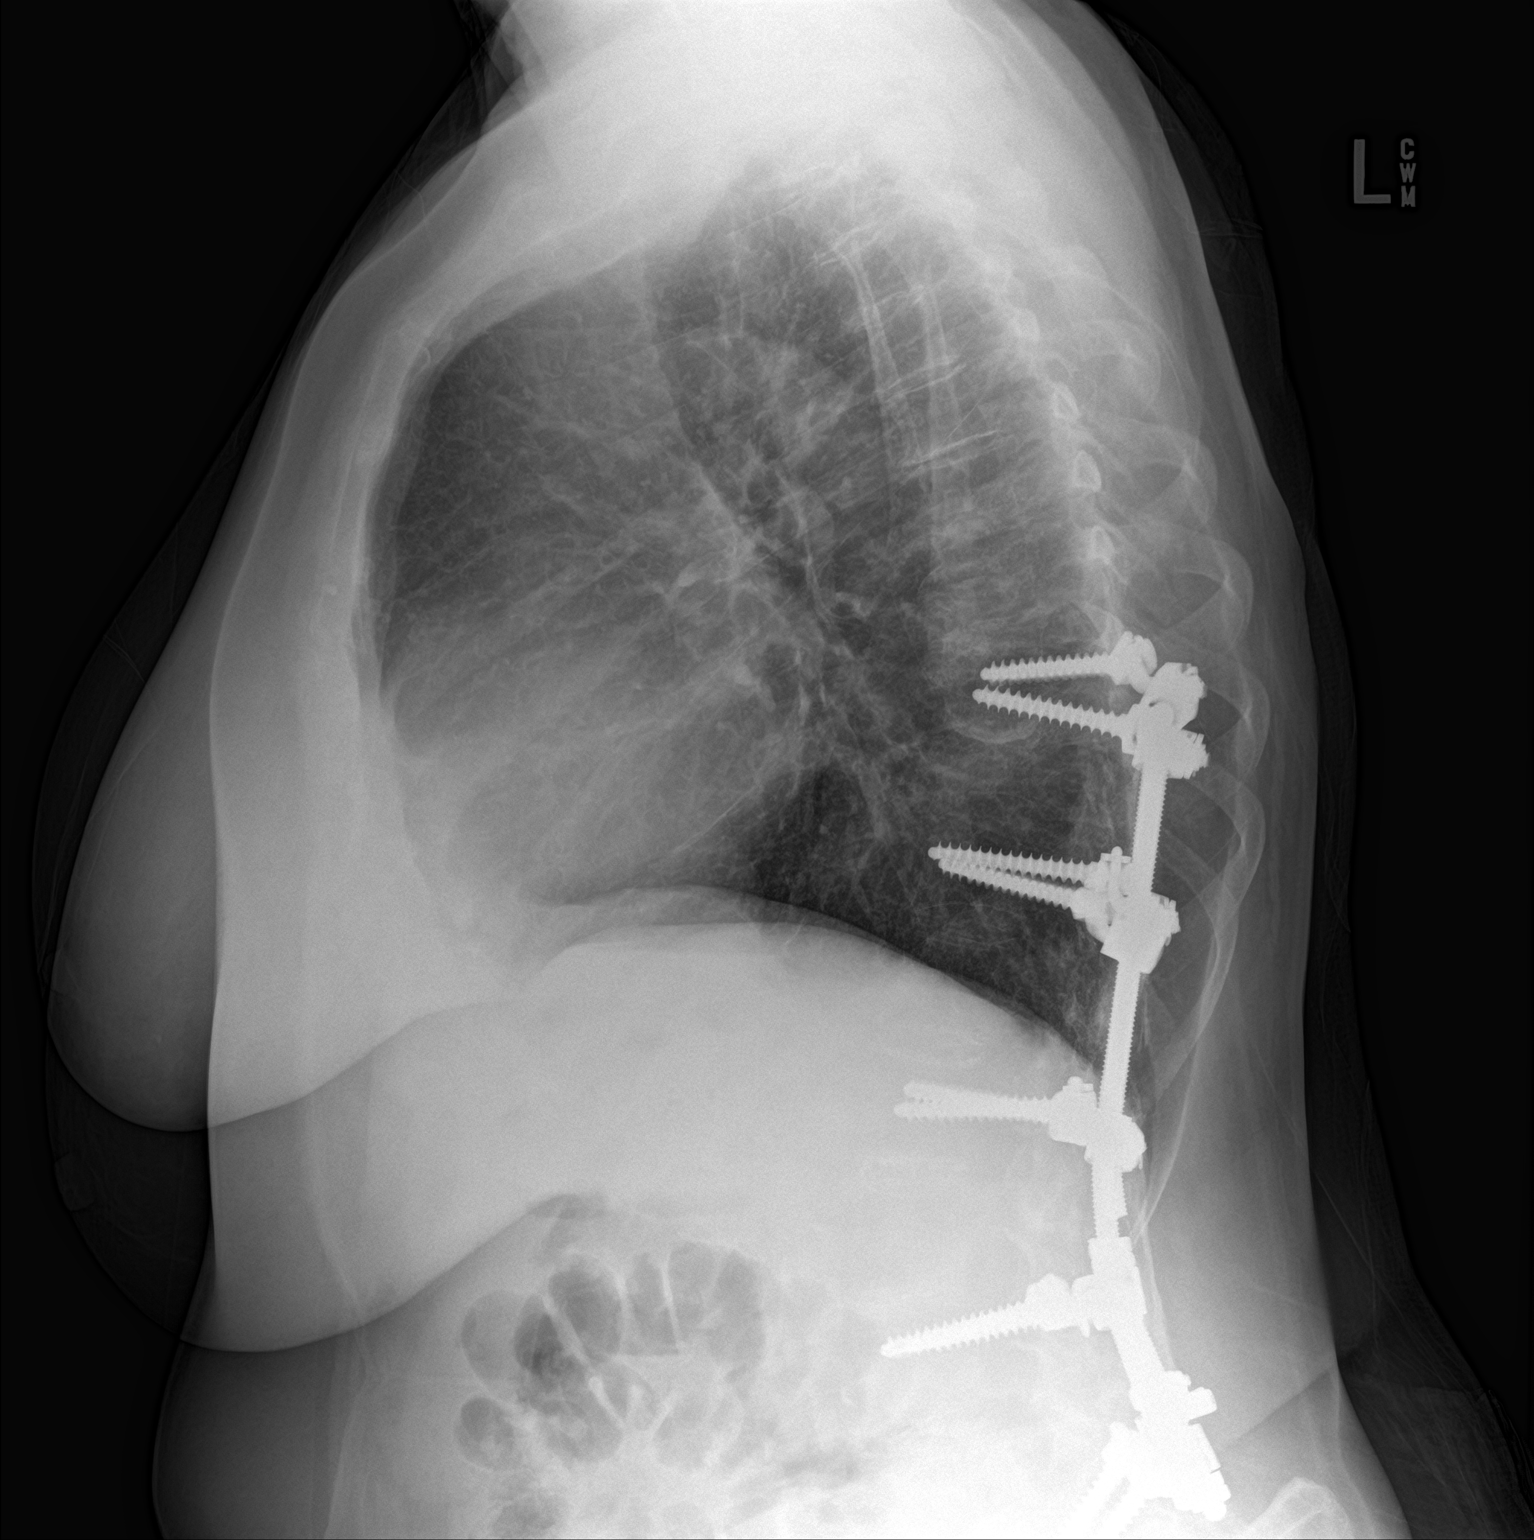

[2 of 2 positions shown; findings below may reference images not displayed]

FINDINGS: No active infiltrate or effusion is seen. Mediastinal and hilar
contours are unremarkable. The heart is within normal limits in
size. Thoracolumbar scoliosis is present with Harrington rods noted
overlying the lower thoracic and upper lumbar spine.
IMPRESSION: No active cardiopulmonary disease. Thoracolumbar scoliosis with
Harrington rods.

## 2019-12-07 ENCOUNTER — Other Ambulatory Visit: Payer: Self-pay | Admitting: Family Medicine

## 2019-12-07 DIAGNOSIS — I10 Essential (primary) hypertension: Secondary | ICD-10-CM

## 2019-12-09 ENCOUNTER — Encounter: Payer: Self-pay | Admitting: Family Medicine

## 2020-01-04 NOTE — Progress Notes (Signed)
Nurse connected with patient 01/05/20 at 11:00 AM EDT by a telephone enabled telemedicine application and verified that I am speaking with the correct person using two identifiers. Patient stated full name and DOB. Patient gave permission to continue with virtual visit. Patient's location was at home and Nurse's location was at [place] office.   Subjective:   Kayla [name] is a 74 y.o. female who presents for Medicare Annual (Subsequent) preventive examination.  Review of Systems:    Home Safety/Smoke Alarms: Feels safe in home. Smoke alarms in place.  Lives w/ husband. 2 story home. Does well w/ stairs.    Female:     Mammo- 04/22/19      Dexa scan- 04/22/19       CCS- No longer doing routine screening due to age. Eye- Dr.Digby's group.    Objective:     Vitals: Unable to assess. This visit is enabled though telemedicine due to Covid 19.   Advanced Directives 01/05/2020 04/13/2019 01/05/2019 07/17/2017 08/22/2015  Does Patient Have a Medical Advance Directive? Yes No Yes No Yes  Type of Paramedic of [place];Living will - [place];Living will - [place];Living will  Does patient want to make changes to medical advance directive? No - Patient declined - No - Patient declined - No - Patient declined  Copy of [place] in Chart? No - copy requested - No - copy requested - No - copy requested  Would patient like information on creating a medical advance directive? - No - Patient declined - No - Patient declined -    Tobacco Social History   Tobacco Use  Smoking Status Former Smoker  . Packs/day: 0.50  . Years: 10.00  . Pack years: 5.00  . Types: Cigarettes  . Quit date: 09/10/1974  . Years since quitting: 45.3  Smokeless Tobacco Never Used  Tobacco Comment   smoked 1966- 1976, up to 1 ppd     Counseling given: Not Answered Comment: smoked 1966- 1976, up to 1 ppd   Clinical Intake: Pain :  No/denies pain     Past Medical History:  Diagnosis Date  . Abscess of groin, left 11/10/2014  . Abscess of left groin 11/10/2014  . Asthma   . CAP (community acquired pneumonia) 08/2012   Dr Birdie Riddle  . Cellulitis 10/20/2014  . Diverticulitis   . Diverticulosis   . Fasting hyperglycemia 04/2011   FBS 108  . Fibromyalgia   . Fundic gland polyps of stomach, benign   . GERD (gastroesophageal reflux disease)    gastric polyp x3  . Glaucoma     Dr Bing Plume  . IBS (irritable bowel syndrome)   . Osteopenia    last 07/2011  . Pneumonia     OP as child  . Shingles    Past Surgical History:  Procedure Laterality Date  . COLONOSCOPY W/ POLYPECTOMY  [phone]   last  colonoscopy 2007, Dr Carlean Purl  . ESOPHAGOGASTRODUODENOSCOPY    . SPINE SURGERY     T9-L5 fusions   Family History  Problem Relation Age of Onset  . Breast cancer Mother   . Breast cancer Sister   . Breast cancer Maternal Aunt        two  . Breast cancer Paternal Aunt   . Asthma Paternal Aunt   . Heart failure Father        CHF  . Kidney disease Maternal Grandmother   . Ovarian cancer Other  Niece x2  . Breast cancer Other        niece  . Coronary artery disease Paternal Aunt        triple CABG  . Heart attack Paternal Grandmother        MI in late 84s  . Heart disease Paternal Grandfather   . Heart attack Paternal Uncle         MI in 68s  . Heart disease Maternal Aunt   . Pulmonary embolism Son   . Deep vein thrombosis Son   . Heart Problems Son   . Diabetes Neg Hx   . Stroke Neg Hx   . COPD Neg Hx   . Colon polyps Neg Hx    Social History   Socioeconomic History  . Marital status: Married    Spouse name: Not on file  . Number of children: Not on file  . Years of education: Not on file  . Highest education level: Not on file  Occupational History  . Not on file  Tobacco Use  . Smoking status: Former Smoker    Packs/day: 0.50    Years: 10.00    Pack years: 5.00    Types: Cigarettes      Quit date: 09/10/1974    Years since quitting: 45.3  . Smokeless tobacco: Never Used  . Tobacco comment: smoked 1966- 1976, up to 1 ppd  Substance and Sexual Activity  . Alcohol use: No    Alcohol/week: 0.0 standard drinks  . Drug use: No  . Sexual activity: Yes  Other Topics Concern  . Not on file  Social History Narrative   Daily caffeine 3 cups   Regular exercise   Married         Social Determinants of Health   Financial Resource Strain: Low Risk   . Difficulty of Paying Living Expenses: Not hard at all  Food Insecurity: No Food Insecurity  . Worried About Charity fundraiser in the Last Year: Never true  . Ran Out of Food in the Last Year: Never true  Transportation Needs: No Transportation Needs  . Lack of Transportation (Medical): No  . Lack of Transportation (Non-Medical): No  Physical Activity:   . Days of Exercise per Week:   . Minutes of Exercise per Session:   Stress:   . Feeling of Stress :   Social Connections:   . Frequency of Communication with Friends and Family:   . Frequency of Social Gatherings with Friends and Family:   . Attends Religious Services:   . Active Member of Clubs or Organizations:   . Attends Archivist Meetings:   Marland Kitchen Marital Status:     Outpatient Encounter Medications as of 01/05/2020  Medication Sig  . acetaminophen (TYLENOL) 500 MG tablet Take 500 mg by mouth as needed.  Marland Kitchen albuterol (PROVENTIL) (2.5 MG/3ML) 0.083% nebulizer solution Take 3 mLs (2.5 mg total) by nebulization every 6 (six) hours as needed for wheezing or shortness of breath.  Marland Kitchen albuterol (VENTOLIN HFA) 108 (90 Base) MCG/ACT inhaler Inhale 2 puffs into the lungs every 6 (six) hours as needed for wheezing or shortness of breath. As needed only  . amLODipine (NORVASC) 2.5 MG tablet TAKE 1 TABLET(2.5 MG) BY MOUTH DAILY  . bifidobacterium infantis (ALIGN) capsule Take 1 capsule by mouth daily.  . carboxymethylcellulose (REFRESH PLUS) 0.5 % SOLN 1 drop 3 (three)  times daily as needed.  . cholecalciferol (VITAMIN D) 1000 units tablet Take 1 capsule by mouth daily.  Marland Kitchen  famotidine (PEPCID) 10 MG tablet Take 10 mg by mouth at bedtime. Alternating with 20mg  tablets she had  . fluticasone (FLONASE) 50 MCG/ACT nasal spray USE 2 SPRAYS IN EACH  NOSTRIL ONCE A DAY  . GuaiFENesin (MUCINEX PO) Take by mouth as needed. Reported on 03/15/2016  . hyoscyamine (LEVSIN SL) 0.125 MG SL tablet Place 1 tablet (0.125 mg total) under the tongue every 6 (six) hours as needed.  . latanoprost (XALATAN) 0.005 % ophthalmic solution Place 1 drop into both eyes at bedtime.  . metoprolol tartrate (LOPRESSOR) 25 MG tablet 1 po bid  . montelukast (SINGULAIR) 10 MG tablet TAKE 1 TABLET(10 MG) BY MOUTH DAILY.  NEEDS OV/FOLLOW UP  . Multiple Minerals-Vitamins (CALCIUM CITRATE PLUS/MAGNESIUM PO) Take by mouth.  . nystatin cream (MYCOSTATIN) Apply 1 application topically 2 (two) times daily.  . Omega-3 Fatty Acids (FISH OIL PO) Take by mouth.  Marland Kitchen omeprazole (PRILOSEC) 20 MG capsule Take 1 capsule (20 mg total) by mouth daily. To try to reduce from 40 mg - will alternately taper  . omeprazole (PRILOSEC) 40 MG capsule TAKE 1 CAPSULE BY MOUTH  DAILY  . Polyethyl Glycol-Propyl Glycol (SYSTANE) 0.4-0.3 % GEL ophthalmic gel Place 1 application into both eyes.  . sodium chloride (OCEAN) 0.65 % SOLN nasal spray Place 1 spray into both nostrils as needed for congestion.  . triamcinolone cream (KENALOG) 0.1 % Apply 1 application topically 2 (two) times daily.  . Vitamin Mixture (ESTER-C PO) Take 1,000 mg by mouth daily.  . colchicine 0.6 MG tablet Take 1 tablet (0.6 mg total) by mouth 2 (two) times daily. 1 po bid x 2-3 days then take 1 a day (Patient not taking: Reported on 01/05/2020)  . EPINEPHrine (EPIPEN 2-PAK) 0.3 mg/0.3 mL IJ SOAJ injection As directed (Patient not taking: Reported on 01/05/2020)   No facility-administered encounter medications on file as of 01/05/2020.    Activities of Daily  Living In your present state of health, do you have any difficulty performing the following activities: 01/05/2020 01/05/2019  Hearing? N N  Vision? N N  Difficulty concentrating or making decisions? N N  Walking or climbing stairs? N N  Dressing or bathing? N N  Doing errands, shopping? N N  Preparing Food and eating ? N N  Using the Toilet? N N  In the past six months, have you accidently leaked urine? N N  Do you have problems with loss of bowel control? N N  Managing your Medications? N N  Managing your Finances? N N  Housekeeping or managing your Housekeeping? N N  Some recent data might be hidden    Patient Care Team: Carollee Herter, Alferd Apa, DO as PCP - General (Family Medicine) Gatha Mayer, MD as Consulting Physician (Gastroenterology) Calvert Cantor, MD as Consulting Physician (Ophthalmology) Beshears, Dorie Rank, DMD (Dentistry) Minus Breeding, MD as Consulting Physician (Cardiology) Izora Gala, MD as Consulting Physician (Otolaryngology) Lewanda Rife, Bennie Pierini., DDS as Consulting Physician (Oral Surgery) Gavin Pound, MD as Consulting Physician (Rheumatology) Philemon Kingdom, MD as Consulting Physician (Endocrinology) Ulla Gallo, MD as Consulting Physician (Dermatology)    Assessment:   This is a routine wellness examination for [place]. Physical assessment deferred to PCP.  Exercise Activities and Dietary recommendations Current Exercise Habits: Home exercise routine, Type of exercise: strength training/weights;yoga, Time (Minutes): 20, Frequency (Times/Week): 4, Weekly Exercise (Minutes/Week): 80, Exercise limited by: None identified Diet (meal preparation, eat out, water intake, caffeinated beverages, dairy products, fruits and vegetables): in general, a "  healthy" diet  , well balanced      Goals    . maintain current healthy lifestyle (pt-stated)       Fall Risk Fall Risk  01/05/2020 01/05/2019 01/02/2018 07/17/2017 07/02/2017  Falls in the past year?  0 0 No No No  Number falls in past yr: 0 - - - -  Injury with Fall? 0 - - - -  Follow up Education provided;Falls prevention discussed - - - -   Depression Screen PHQ 2/9 Scores 01/05/2020 01/05/2019 01/02/2018 07/17/2017  PHQ - 2 Score 0 0 0 0  Exception Documentation - - - -     Cognitive Function Ad8 score reviewed for issues:  Issues making decisions:no  Less interest in hobbies / activities:no  Repeats questions, stories (family complaining):no  Trouble using ordinary gadgets (microwave, computer, phone):no  Forgets the month or year: no  Mismanaging finances: no  Remembering appts:no  Daily problems with thinking and/or memory:no Ad8 score is=0     MMSE - Mini Mental State Exam 07/17/2017  Orientation to time 5  Orientation to Place 5  Registration 3  Attention/ Calculation 5  Recall 3  Language- name 2 objects 2  Language- repeat 1  Language- follow 3 step command 3  Language- read & follow direction 1  Write a sentence 1  Copy design 1  Total score 30        Immunization History  Administered Date(s) Administered  . Influenza Whole 06/14/2008, 06/09/2010, 07/06/2011  . Influenza, High Dose Seasonal PF 06/10/2014, 05/26/2015, 05/24/2016, 07/02/2017  . Influenza, Seasonal, Injecte, Preservative Fre 05/26/2013  . Influenza-Unspecified 05/30/2012, 05/22/2013, 05/15/2018, 05/26/2019  . PFIZER SARS-COV-2 Vaccination 11/05/2019, 11/25/2019  . Pneumococcal Conjugate-13 01/11/2014  . Pneumococcal Polysaccharide-23 07/12/2011, 04/24/2019  . Td 03/07/2006  . Tdap 02/17/2015  . Zoster 08/19/2013  . Zoster Recombinat (Shingrix) 12/08/2017, 03/21/2018    Screening Tests Health Maintenance  Topic Date Due  . INFLUENZA VACCINE  04/10/2020  . MAMMOGRAM  04/30/2020  . TETANUS/TDAP  02/16/2025  . COLONOSCOPY  03/15/2026  . DEXA SCAN  Completed  . COVID-19 Vaccine  Completed  . Hepatitis C Screening  Completed  . PNA vac Low Risk Adult  Completed        Plan:    Please schedule your next medicare wellness visit with me in 1 yr.  Continue to eat heart healthy diet (full of fruits, vegetables, whole grains, lean protein, water--limit salt, fat, and sugar intake) and increase physical activity as tolerated.  Continue doing brain stimulating activities (puzzles, reading, adult coloring books, staying active) to keep memory sharp.   Bring a copy of your living will and/or healthcare power of attorney to your next office visit.   I have personally reviewed and noted the following in the patient's chart:   . Medical and social history . Use of alcohol, tobacco or illicit drugs  . Current medications and supplements . Functional ability and status . Nutritional status . Physical activity . Advanced directives . List of other physicians . Hospitalizations, surgeries, and ER visits in previous 12 months . Vitals . Screenings to include cognitive, depression, and falls . Referrals and appointments  In addition, I have reviewed and discussed with patient certain preventive protocols, quality metrics, and best practice recommendations. A written personalized care plan for preventive services as well as general preventive health recommendations were provided to patient.     Kayla [name] [place], [place]  01/05/2020

## 2020-01-05 ENCOUNTER — Other Ambulatory Visit: Payer: Self-pay

## 2020-01-05 ENCOUNTER — Encounter: Payer: Self-pay | Admitting: *Deleted

## 2020-01-05 ENCOUNTER — Ambulatory Visit (INDEPENDENT_AMBULATORY_CARE_PROVIDER_SITE_OTHER): Payer: Medicare Other | Admitting: *Deleted

## 2020-01-05 DIAGNOSIS — Z0000 Encounter for general adult medical examination without abnormal findings: Secondary | ICD-10-CM | POA: Diagnosis not present

## 2020-01-05 NOTE — Patient Instructions (Addendum)
Please schedule your next medicare wellness visit with me in 1 yr.  Continue to eat heart healthy diet (full of fruits, vegetables, whole grains, lean protein, water--limit salt, fat, and sugar intake) and increase physical activity as tolerated.  Continue doing brain stimulating activities (puzzles, reading, adult coloring books, staying active) to keep memory sharp.   Bring a copy of your living will and/or healthcare power of attorney to your next office visit.   Kayla [name] , Thank you for taking time to come for your Medicare Wellness Visit. I appreciate your ongoing commitment to your health goals. Please review the following plan we discussed and let me know if I can assist you in the future.   These are the goals we discussed: Goals    . maintain current healthy lifestyle (pt-stated)       This is a list of the screening recommended for you and due dates:  Health Maintenance  Topic Date Due  . Flu Shot  04/10/2020  . Mammogram  04/30/2020  . Tetanus Vaccine  02/16/2025  . Colon Cancer Screening  03/15/2026  . DEXA scan (bone density measurement)  Completed  . COVID-19 Vaccine  Completed  .  Hepatitis C: One time screening is recommended by Center for Disease Control  (CDC) for  adults born from [birth year] through 1966.   Completed  . Pneumonia vaccines  Completed    Preventive Care 35 Years and Older, Female Preventive care refers to lifestyle choices and visits with your health care provider that can promote health and wellness. This includes:  A yearly physical exam. This is also called an annual well check.  Regular dental and eye exams.  Immunizations.  Screening for certain conditions.  Healthy lifestyle choices, such as diet and exercise. What can I expect for my preventive care visit? Physical exam Your health care provider will check:  Height and weight. These may be used to calculate body mass index (BMI), which is a measurement that tells if you are at a  healthy weight.  Heart rate and blood pressure.  Your skin for abnormal spots. Counseling Your health care provider may ask you questions about:  Alcohol, tobacco, and drug use.  Emotional well-being.  Home and relationship well-being.  Sexual activity.  Eating habits.  History of falls.  Memory and ability to understand (cognition).  Work and work Statistician.  Pregnancy and menstrual history. What immunizations do I need?  Influenza (flu) vaccine  This is recommended every year. Tetanus, diphtheria, and pertussis (Tdap) vaccine  You may need a Td booster every 10 years. Varicella (chickenpox) vaccine  You may need this vaccine if you have not already been vaccinated. Zoster (shingles) vaccine  You may need this after age 29. Pneumococcal conjugate (PCV13) vaccine  One dose is recommended after age 45. Pneumococcal polysaccharide (PPSV23) vaccine  One dose is recommended after age 12. Measles, mumps, and rubella (MMR) vaccine  You may need at least one dose of MMR if you were born in 1957 or later. You may also need a second dose. Meningococcal conjugate (MenACWY) vaccine  You may need this if you have certain conditions. Hepatitis A vaccine  You may need this if you have certain conditions or if you travel or work in places where you may be exposed to hepatitis A. Hepatitis B vaccine  You may need this if you have certain conditions or if you travel or work in places where you may be exposed to hepatitis B. Haemophilus influenzae type b (  Hib) vaccine  You may need this if you have certain conditions. You may receive vaccines as individual doses or as more than one vaccine together in one shot (combination vaccines). Talk with your health care provider about the risks and benefits of combination vaccines. What tests do I need? Blood tests  Lipid and cholesterol levels. These may be checked every 5 years, or more frequently depending on your overall  health.  Hepatitis C test.  Hepatitis B test. Screening  Lung cancer screening. You may have this screening every year starting at age 33 if you have a 30-pack-year history of smoking and currently smoke or have quit within the past 15 years.  Colorectal cancer screening. All adults should have this screening starting at age 32 and continuing until age 75. Your health care provider may recommend screening at age 53 if you are at increased risk. You will have tests every 1-10 years, depending on your results and the type of screening test.  Diabetes screening. This is done by checking your blood sugar (glucose) after you have not eaten for a while (fasting). You may have this done every 1-3 years.  Mammogram. This may be done every 1-2 years. Talk with your health care provider about how often you should have regular mammograms.  BRCA-related cancer screening. This may be done if you have a family history of breast, ovarian, tubal, or peritoneal cancers. Other tests  Sexually transmitted disease (STD) testing.  Bone density scan. This is done to screen for osteoporosis. You may have this done starting at age 42. Follow these instructions at home: Eating and drinking  Eat a diet that includes fresh fruits and vegetables, whole grains, lean protein, and low-fat dairy products. Limit your intake of foods with high amounts of sugar, saturated fats, and salt.  Take vitamin and mineral supplements as recommended by your health care provider.  Do not drink alcohol if your health care provider tells you not to drink.  If you drink alcohol: ? Limit how much you have to 0-1 drink a day. ? Be aware of how much alcohol is in your drink. In the [place] one drink equals one 12 oz bottle of beer (355 mL), one 5 oz glass of wine (148 mL), or one 1 oz glass of hard liquor (44 mL). Lifestyle  Take daily care of your teeth and gums.  Stay active. Exercise for at least 30 minutes on 5 or more days  each week.  Do not use any products that contain nicotine or tobacco, such as cigarettes, e-cigarettes, and chewing tobacco. If you need help quitting, ask your health care provider.  If you are sexually active, practice safe sex. Use a condom or other form of protection in order to prevent STIs (sexually transmitted infections).  Talk with your health care provider about taking a low-dose aspirin or statin. What's next?  Go to your health care provider once a year for a well check visit.  Ask your health care provider how often you should have your eyes and teeth checked.  Stay up to date on all vaccines. This information is not intended to replace advice given to you by your health care provider. Make sure you discuss any questions you have with your health care provider. Document Revised: 08/21/2018 Document Reviewed: 08/21/2018 Elsevier Patient Education  2020 [employer].

## 2020-01-18 ENCOUNTER — Other Ambulatory Visit: Payer: Self-pay

## 2020-01-18 ENCOUNTER — Encounter: Payer: Self-pay | Admitting: Family Medicine

## 2020-01-18 ENCOUNTER — Ambulatory Visit (INDEPENDENT_AMBULATORY_CARE_PROVIDER_SITE_OTHER): Payer: Medicare Other | Admitting: Family Medicine

## 2020-01-18 VITALS — BP 132/68 | HR 70 | Temp 97.9°F | Resp 18 | Ht 61.0 in | Wt 151.6 lb

## 2020-01-18 DIAGNOSIS — E785 Hyperlipidemia, unspecified: Secondary | ICD-10-CM | POA: Diagnosis not present

## 2020-01-18 DIAGNOSIS — T7840XA Allergy, unspecified, initial encounter: Secondary | ICD-10-CM | POA: Insufficient documentation

## 2020-01-18 DIAGNOSIS — T7840XD Allergy, unspecified, subsequent encounter: Secondary | ICD-10-CM

## 2020-01-18 DIAGNOSIS — I10 Essential (primary) hypertension: Secondary | ICD-10-CM | POA: Diagnosis not present

## 2020-01-18 DIAGNOSIS — E559 Vitamin D deficiency, unspecified: Secondary | ICD-10-CM | POA: Diagnosis not present

## 2020-01-18 DIAGNOSIS — R21 Rash and other nonspecific skin eruption: Secondary | ICD-10-CM | POA: Diagnosis not present

## 2020-01-18 DIAGNOSIS — J45909 Unspecified asthma, uncomplicated: Secondary | ICD-10-CM

## 2020-01-18 LAB — COMPREHENSIVE METABOLIC PANEL
ALT: 14 U/L (ref 0–35)
AST: 21 U/L (ref 0–37)
Albumin: 4.5 g/dL (ref 3.5–5.2)
Alkaline Phosphatase: 83 U/L (ref 39–117)
BUN: 16 mg/dL (ref 6–23)
CO2: 28 mEq/L (ref 19–32)
Calcium: 9.8 mg/dL (ref 8.4–10.5)
Chloride: 101 mEq/L (ref 96–112)
Creatinine, Ser: 0.74 mg/dL (ref 0.40–1.20)
GFR: 76.64 mL/min (ref >60.00)
Glucose, Bld: 84 mg/dL (ref 70–99)
Potassium: 4.1 mEq/L (ref 3.5–5.1)
Sodium: 138 mEq/L (ref 135–145)
Total Bilirubin: 0.5 mg/dL (ref 0.2–1.2)
Total Protein: 6.7 g/dL (ref 6.0–8.3)

## 2020-01-18 LAB — LIPID PANEL
Cholesterol: 218 mg/dL — ABNORMAL HIGH (ref 0–200)
HDL: 60.8 mg/dL (ref >39.00)
LDL Cholesterol: 127 mg/dL — ABNORMAL HIGH (ref 0–99)
NonHDL: 157.69
Total CHOL/HDL Ratio: 4
Triglycerides: 152 mg/dL — ABNORMAL HIGH (ref 0.0–149.0)
VLDL: 30.4 mg/dL (ref 0.0–40.0)

## 2020-01-18 LAB — VITAMIN D 25 HYDROXY (VIT D DEFICIENCY, FRACTURES): VITD: 42.31 ng/mL (ref 30.00–100.00)

## 2020-01-18 MED ORDER — NYSTATIN 100000 UNIT/GM EX CREA: 1.0000 "application " | TOPICAL_CREAM | Freq: Two times a day (BID) | CUTANEOUS | 3 refills | Status: DC

## 2020-01-18 MED ORDER — MONTELUKAST SODIUM 10 MG PO TABS: ORAL_TABLET | ORAL | 2 refills | Status: DC

## 2020-01-18 NOTE — Assessment & Plan Note (Signed)
Encouraged heart healthy diet, increase exercise, avoid trans fats, consider a krill oil cap daily 

## 2020-01-18 NOTE — Assessment & Plan Note (Signed)
Well controlled, no changes to meds. Encouraged heart healthy diet such as the DASH diet and exercise as tolerated.  °

## 2020-01-18 NOTE — Assessment & Plan Note (Signed)
With allergies con't singulair

## 2020-01-18 NOTE — Assessment & Plan Note (Signed)
Check labs 

## 2020-01-18 NOTE — Patient Instructions (Signed)
DASH Eating Plan DASH stands for "Dietary Approaches to Stop Hypertension." The DASH eating plan is a healthy eating plan that has been shown to reduce high blood pressure (hypertension). It may also reduce your risk for type 2 diabetes, heart disease, and stroke. The DASH eating plan may also help with weight loss. What are tips for following this plan?  General guidelines  Avoid eating more than 2,300 mg (milligrams) of salt (sodium) a day. If you have hypertension, you may need to reduce your sodium intake to 1,500 mg a day.  Limit alcohol intake to no more than 1 drink a day for nonpregnant women and 2 drinks a day for men. One drink equals 12 oz of beer, 5 oz of wine, or 1 oz of hard liquor.  Work with your health care provider to maintain a healthy body weight or to lose weight. Ask what an ideal weight is for you.  Get at least 30 minutes of exercise that causes your heart to beat faster (aerobic exercise) most days of the week. Activities may include walking, swimming, or biking.  Work with your health care provider or diet and nutrition specialist (dietitian) to adjust your eating plan to your individual calorie needs. Reading food labels   Check food labels for the amount of sodium per serving. Choose foods with less than 5 percent of the Daily Value of sodium. Generally, foods with less than 300 mg of sodium per serving fit into this eating plan.  To find whole grains, look for the word "whole" as the first word in the ingredient list. Shopping  Buy products labeled as "low-sodium" or "no salt added."  Buy fresh foods. Avoid canned foods and premade or frozen meals. Cooking  Avoid adding salt when cooking. Use salt-free seasonings or herbs instead of table salt or sea salt. Check with your health care provider or pharmacist before using salt substitutes.  Do not fry foods. Cook foods using healthy methods such as baking, boiling, grilling, and broiling instead.  Cook with  heart-healthy oils, such as olive, canola, soybean, or sunflower oil. Meal planning  Eat a balanced diet that includes: ? 5 or more servings of fruits and vegetables each day. At each meal, try to fill half of your plate with fruits and vegetables. ? Up to 6-8 servings of whole grains each day. ? Less than 6 oz of lean meat, poultry, or fish each day. A 3-oz serving of meat is about the same size as a deck of cards. One egg equals 1 oz. ? 2 servings of low-fat dairy each day. ? A serving of nuts, seeds, or beans 5 times each week. ? Heart-healthy fats. Healthy fats called Omega-3 fatty acids are found in foods such as flaxseeds and coldwater fish, like sardines, salmon, and mackerel.  Limit how much you eat of the following: ? Canned or prepackaged foods. ? Food that is high in trans fat, such as fried foods. ? Food that is high in saturated fat, such as fatty meat. ? Sweets, desserts, sugary drinks, and other foods with added sugar. ? Full-fat dairy products.  Do not salt foods before eating.  Try to eat at least 2 vegetarian meals each week.  Eat more home-cooked food and less restaurant, buffet, and fast food.  When eating at a restaurant, ask that your food be prepared with less salt or no salt, if possible. What foods are recommended? The items listed may not be a complete list. Talk with your dietitian about   what dietary choices are best for you. Grains Whole-grain or whole-wheat bread. Whole-grain or whole-wheat pasta. Brown rice. Oatmeal. Quinoa. Bulgur. Whole-grain and low-sodium cereals. Pita bread. Low-fat, low-sodium crackers. Whole-wheat flour tortillas. Vegetables Fresh or frozen vegetables (raw, steamed, roasted, or grilled). Low-sodium or reduced-sodium tomato and vegetable juice. Low-sodium or reduced-sodium tomato sauce and tomato paste. Low-sodium or reduced-sodium canned vegetables. Fruits All fresh, dried, or frozen fruit. Canned fruit in natural juice (without  added sugar). Meat and other protein foods Skinless chicken or turkey. Ground chicken or turkey. Pork with fat trimmed off. Fish and seafood. Egg whites. Dried beans, peas, or lentils. Unsalted nuts, nut butters, and seeds. Unsalted canned beans. Lean cuts of beef with fat trimmed off. Low-sodium, lean deli meat. Dairy Low-fat (1%) or fat-free (skim) milk. Fat-free, low-fat, or reduced-fat cheeses. Nonfat, low-sodium ricotta or cottage cheese. Low-fat or nonfat yogurt. Low-fat, low-sodium cheese. Fats and oils Soft margarine without trans fats. Vegetable oil. Low-fat, reduced-fat, or light mayonnaise and salad dressings (reduced-sodium). Canola, safflower, olive, soybean, and sunflower oils. Avocado. Seasoning and other foods Herbs. Spices. Seasoning mixes without salt. Unsalted popcorn and pretzels. Fat-free sweets. What foods are not recommended? The items listed may not be a complete list. Talk with your dietitian about what dietary choices are best for you. Grains Baked goods made with fat, such as croissants, muffins, or some breads. Dry pasta or rice meal packs. Vegetables Creamed or fried vegetables. Vegetables in a cheese sauce. Regular canned vegetables (not low-sodium or reduced-sodium). Regular canned tomato sauce and paste (not low-sodium or reduced-sodium). Regular tomato and vegetable juice (not low-sodium or reduced-sodium). Pickles. Olives. Fruits Canned fruit in a light or heavy syrup. Fried fruit. Fruit in cream or butter sauce. Meat and other protein foods Fatty cuts of meat. Ribs. Fried meat. Bacon. Sausage. Bologna and other processed lunch meats. Salami. Fatback. Hotdogs. Bratwurst. Salted nuts and seeds. Canned beans with added salt. Canned or smoked fish. Whole eggs or egg yolks. Chicken or turkey with skin. Dairy Whole or 2% milk, cream, and half-and-half. Whole or full-fat cream cheese. Whole-fat or sweetened yogurt. Full-fat cheese. Nondairy creamers. Whipped toppings.  Processed cheese and cheese spreads. Fats and oils Butter. Stick margarine. Lard. Shortening. Ghee. Bacon fat. Tropical oils, such as coconut, palm kernel, or palm oil. Seasoning and other foods Salted popcorn and pretzels. Onion salt, garlic salt, seasoned salt, table salt, and sea salt. Worcestershire sauce. Tartar sauce. Barbecue sauce. Teriyaki sauce. Soy sauce, including reduced-sodium. Steak sauce. Canned and packaged gravies. Fish sauce. Oyster sauce. Cocktail sauce. Horseradish that you find on the shelf. Ketchup. Mustard. Meat flavorings and tenderizers. Bouillon cubes. Hot sauce and Tabasco sauce. Premade or packaged marinades. Premade or packaged taco seasonings. Relishes. Regular salad dressings. Where to find more information:  National Heart, Lung, and Blood Institute: www.nhlbi.nih.gov  American Heart Association: www.heart.org Summary  The DASH eating plan is a healthy eating plan that has been shown to reduce high blood pressure (hypertension). It may also reduce your risk for type 2 diabetes, heart disease, and stroke.  With the DASH eating plan, you should limit salt (sodium) intake to 2,300 mg a day. If you have hypertension, you may need to reduce your sodium intake to 1,500 mg a day.  When on the DASH eating plan, aim to eat more fresh fruits and vegetables, whole grains, lean proteins, low-fat dairy, and heart-healthy fats.  Work with your health care provider or diet and nutrition specialist (dietitian) to adjust your eating plan to your   individual calorie needs. This information is not intended to replace advice given to you by your health care provider. Make sure you discuss any questions you have with your health care provider. Document Revised: 08/09/2017 Document Reviewed: 08/20/2016 Elsevier Patient Education  2020 [employer] Inc.  

## 2020-01-18 NOTE — Progress Notes (Signed)
Patient ID: Kayla [name], female    DOB: Sep 12, 1945  Age: 74 y.o. MRN: [id number]    Subjective:  Subjective  HPI Kayla [name] presents for f/u bp and labs --  She has ? About her K  No other complaints     Review of Systems  Constitutional: Negative for appetite change, diaphoresis, fatigue and unexpected weight change.  Eyes: Negative for pain, redness and visual disturbance.  Respiratory: Negative for cough, chest tightness, shortness of breath and wheezing.   Cardiovascular: Negative for chest pain, palpitations and leg swelling.  Endocrine: Negative for cold intolerance, heat intolerance, polydipsia, polyphagia and polyuria.  Genitourinary: Negative for difficulty urinating, dysuria and frequency.  Neurological: Negative for dizziness, light-headedness, numbness and headaches.    History Past Medical History:  Diagnosis Date  . Abscess of groin, left 11/10/2014  . Abscess of left groin 11/10/2014  . Asthma   . CAP (community acquired pneumonia) 08/2012   Dr Birdie Riddle  . Cellulitis 10/20/2014  . Diverticulitis   . Diverticulosis   . Fasting hyperglycemia 04/2011   FBS 108  . Fibromyalgia   . Fundic gland polyps of stomach, benign   . GERD (gastroesophageal reflux disease)    gastric polyp x3  . Glaucoma     Dr Bing Plume  . IBS (irritable bowel syndrome)   . Osteopenia    last 07/2011  . Pneumonia     OP as child  . Shingles     She has a past surgical history that includes Spine surgery; Colonoscopy w/ polypectomy [id number]); and Esophagogastroduodenoscopy.   Her family history includes Asthma in her paternal aunt; Breast cancer in her maternal aunt, mother, paternal aunt, sister, and another family member; Coronary artery disease in her paternal aunt; Deep vein thrombosis in her son; Heart Problems in her son; Heart attack in her paternal grandmother and paternal uncle; Heart disease in her maternal aunt and paternal grandfather; Heart failure in her father;  Kidney disease in her maternal grandmother; Ovarian cancer in an other family member; Pulmonary embolism in her son.She reports that she quit smoking about 45 years ago. Her smoking use included cigarettes. She has a 5.00 pack-year smoking history. She has never used smokeless tobacco. She reports that she does not drink alcohol or use drugs.  Current Outpatient Medications on File Prior to Visit  Medication Sig Dispense Refill  . acetaminophen (TYLENOL) 500 MG tablet Take 500 mg by mouth as needed.    Marland Kitchen albuterol (PROVENTIL) (2.5 MG/3ML) 0.083% nebulizer solution Take 3 mLs (2.5 mg total) by nebulization every 6 (six) hours as needed for wheezing or shortness of breath. 75 mL 12  . albuterol (VENTOLIN HFA) 108 (90 Base) MCG/ACT inhaler Inhale 2 puffs into the lungs every 6 (six) hours as needed for wheezing or shortness of breath. As needed only 18 g 3  . amLODipine (NORVASC) 2.5 MG tablet TAKE 1 TABLET(2.5 MG) BY MOUTH DAILY 90 tablet 0  . bifidobacterium infantis (ALIGN) capsule Take 1 capsule by mouth daily. 21 capsule 0  . carboxymethylcellulose (REFRESH PLUS) 0.5 % SOLN 1 drop 3 (three) times daily as needed.    . cholecalciferol (VITAMIN D) 1000 units tablet Take 1 capsule by mouth daily.    . colchicine 0.6 MG tablet Take 1 tablet (0.6 mg total) by mouth 2 (two) times daily. 1 po bid x 2-3 days then take 1 a day 30 tablet 0  . EPINEPHrine (EPIPEN 2-PAK) 0.3 mg/0.3 mL IJ SOAJ injection As directed 1  each 0  . famotidine (PEPCID) 10 MG tablet Take 10 mg by mouth at bedtime. Alternating with 20mg  tablets she had    . fluticasone (FLONASE) 50 MCG/ACT nasal spray USE 2 SPRAYS IN EACH  NOSTRIL ONCE A DAY 48 g 3  . GuaiFENesin (MUCINEX PO) Take by mouth as needed. Reported on 03/15/2016    . hyoscyamine (LEVSIN SL) 0.125 MG SL tablet Place 1 tablet (0.125 mg total) under the tongue every 6 (six) hours as needed. 10 tablet 1  . latanoprost (XALATAN) 0.005 % ophthalmic solution Place 1 drop into both  eyes at bedtime.    . metoprolol tartrate (LOPRESSOR) 25 MG tablet 1 po bid 180 tablet 2  . Multiple Minerals-Vitamins (CALCIUM CITRATE PLUS/MAGNESIUM PO) Take by mouth.    . Omega-3 Fatty Acids (FISH OIL PO) Take by mouth.    Vladimir Faster Glycol-Propyl Glycol (SYSTANE) 0.4-0.3 % GEL ophthalmic gel Place 1 application into both eyes.    . sodium chloride (OCEAN) 0.65 % SOLN nasal spray Place 1 spray into both nostrils as needed for congestion.    . triamcinolone cream (KENALOG) 0.1 % Apply 1 application topically 2 (two) times daily. 90 g 3  . Vitamin Mixture (ESTER-C PO) Take 1,000 mg by mouth daily.     No current facility-administered medications on file prior to visit.     Objective:  Objective  Physical Exam Vitals and nursing note reviewed.  Constitutional:      Appearance: She is well-developed.  HENT:     Head: Normocephalic and atraumatic.  Eyes:     Conjunctiva/sclera: Conjunctivae normal.  Neck:     Thyroid: No thyromegaly.     Vascular: No carotid bruit or JVD.  Cardiovascular:     Rate and Rhythm: Normal rate and regular rhythm.     Heart sounds: Normal heart sounds. No murmur.  Pulmonary:     Effort: Pulmonary effort is normal. No respiratory distress.     Breath sounds: Normal breath sounds. No wheezing or rales.  Chest:     Chest wall: No tenderness.  Musculoskeletal:     Cervical back: Normal range of motion and neck supple.  Neurological:     Mental Status: She is alert and oriented to person, place, and time.    BP 132/68 (BP Location: Right Arm, Patient Position: Sitting, Cuff Size: Large)   Pulse 70   Temp 97.9 F (36.6 C) (Temporal)   Resp 18   Ht 5\' 1"  (1.549 m)   Wt 151 lb 9.6 oz (68.8 kg)   BMI 28.64 kg/m  Wt Readings from Last 3 Encounters:  01/18/20 151 lb 9.6 oz (68.8 kg)  06/02/19 156 lb (70.8 kg)  03/27/19 158 lb 12.8 oz (72 kg)     Lab Results  Component Value Date   WBC 5.0 11/26/2018   HGB 14.1 11/26/2018   HCT 41.6 11/26/2018    PLT 170.0 11/26/2018   GLUCOSE 85 08/21/2019   CHOL 221 (H) 07/20/2019   TRIG 93.0 07/20/2019   HDL 65.00 07/20/2019   LDLDIRECT 153.3 12/18/2012   LDLCALC 137 (H) 07/20/2019   ALT 16 07/20/2019   AST 20 07/20/2019   NA 139 08/21/2019   K 5.0 08/21/2019   CL 104 08/21/2019   CREATININE 0.70 08/21/2019   BUN 16 08/21/2019   CO2 28 08/21/2019   TSH 3.24 01/02/2018   INR 0.97 12/15/2009   HGBA1C 5.4 11/01/2011    DG Chest 2 View  Result Date: 09/01/2018 CLINICAL  DATA:  Left lower lobe pneumonia. EXAM: CHEST - 2 VIEW COMPARISON:  08/12/2018 FINDINGS: There is no focal parenchymal opacity. There is no pleural effusion or pneumothorax. The heart and mediastinal contours are unremarkable. There is a levoscoliosis of the thoracolumbar spine. IMPRESSION: No active cardiopulmonary disease. Electronically Signed   By: Kathreen Devoid   On: 09/01/2018 12:24     Assessment & Plan:  Plan  I have discontinued Desaree M. Velaquez's omeprazole and omeprazole. I have also changed her montelukast. Additionally, I am having her maintain her GuaiFENesin (MUCINEX PO), acetaminophen, bifidobacterium infantis, Omega-3 Fatty Acids (FISH OIL PO), Multiple Minerals-Vitamins (CALCIUM CITRATE PLUS/MAGNESIUM PO), sodium chloride, carboxymethylcellulose, colchicine, Vitamin Mixture (ESTER-C PO), cholecalciferol, Polyethyl Glycol-Propyl Glycol, latanoprost, triamcinolone cream, albuterol, famotidine, fluticasone, EPINEPHrine, albuterol, hyoscyamine, metoprolol tartrate, amLODipine, and nystatin cream.  Meds ordered this encounter  Medications  . nystatin cream (MYCOSTATIN)    Sig: Apply 1 application topically 2 (two) times daily.    Dispense:  90 g    Refill:  3  . montelukast (SINGULAIR) 10 MG tablet    Sig: TAKE 1 TABLET(10 MG) BY MOUTH DAILY.    Dispense:  90 tablet    Refill:  2    Problem List Items Addressed This Visit      Unprioritized   Allergies   Relevant Medications   montelukast  (SINGULAIR) 10 MG tablet   Asthma    With allergies con't singulair      Relevant Medications   montelukast (SINGULAIR) 10 MG tablet   Essential hypertension - Primary    Well controlled, no changes to meds. Encouraged heart healthy diet such as the DASH diet and exercise as tolerated.       Relevant Orders   Lipid panel   Comprehensive metabolic panel   Hyperlipidemia    Encouraged heart healthy diet, increase exercise, avoid trans fats, consider a krill oil cap daily       Relevant Orders   Lipid panel   Comprehensive metabolic panel   Vitamin D deficiency    Check labs       Relevant Orders   Vitamin D (25 hydroxy)    Other Visit Diagnoses    Rash and nonspecific skin eruption       Relevant Medications   nystatin cream (MYCOSTATIN)      Follow-up: Return in about 6 years (around 01/17/2026), or if symptoms worsen or fail to improve, for annual exam, fasting.  [name], DO

## 2020-01-20 ENCOUNTER — Telehealth: Payer: Self-pay

## 2020-01-20 NOTE — Telephone Encounter (Signed)
Spoke to patient. She states she is taking about 1400 units of Vit D3. Please advise on what to increase it too.

## 2020-01-21 NOTE — Telephone Encounter (Signed)
Inc to 2000u daily

## 2020-01-25 NOTE — Telephone Encounter (Signed)
Mychart message sent.

## 2020-02-05 DIAGNOSIS — H2513 Age-related nuclear cataract, bilateral: Secondary | ICD-10-CM | POA: Diagnosis not present

## 2020-02-05 DIAGNOSIS — H401131 Primary open-angle glaucoma, bilateral, mild stage: Secondary | ICD-10-CM | POA: Diagnosis not present

## 2020-02-05 DIAGNOSIS — H04123 Dry eye syndrome of bilateral lacrimal glands: Secondary | ICD-10-CM | POA: Diagnosis not present

## 2020-03-05 ENCOUNTER — Other Ambulatory Visit: Payer: Self-pay

## 2020-03-05 ENCOUNTER — Encounter (HOSPITAL_BASED_OUTPATIENT_CLINIC_OR_DEPARTMENT_OTHER): Payer: Self-pay | Admitting: Emergency Medicine

## 2020-03-05 ENCOUNTER — Emergency Department (HOSPITAL_BASED_OUTPATIENT_CLINIC_OR_DEPARTMENT_OTHER): Payer: Medicare Other

## 2020-03-05 ENCOUNTER — Emergency Department (HOSPITAL_BASED_OUTPATIENT_CLINIC_OR_DEPARTMENT_OTHER)
Admission: EM | Admit: 2020-03-05 | Discharge: 2020-03-05 | Disposition: A | Discharge status: Home / Self Care | Payer: Medicare Other | Attending: Emergency Medicine | Admitting: Emergency Medicine

## 2020-03-05 DIAGNOSIS — J45909 Unspecified asthma, uncomplicated: Secondary | ICD-10-CM | POA: Insufficient documentation

## 2020-03-05 DIAGNOSIS — R05 Cough: Secondary | ICD-10-CM | POA: Diagnosis not present

## 2020-03-05 DIAGNOSIS — Z79899 Other long term (current) drug therapy: Secondary | ICD-10-CM | POA: Insufficient documentation

## 2020-03-05 DIAGNOSIS — J4521 Mild intermittent asthma with (acute) exacerbation: Secondary | ICD-10-CM | POA: Insufficient documentation

## 2020-03-05 DIAGNOSIS — Z20822 Contact with and (suspected) exposure to covid-19: Secondary | ICD-10-CM | POA: Diagnosis not present

## 2020-03-05 DIAGNOSIS — J069 Acute upper respiratory infection, unspecified: Secondary | ICD-10-CM | POA: Diagnosis not present

## 2020-03-05 DIAGNOSIS — I10 Essential (primary) hypertension: Secondary | ICD-10-CM | POA: Insufficient documentation

## 2020-03-05 DIAGNOSIS — J989 Respiratory disorder, unspecified: Secondary | ICD-10-CM | POA: Insufficient documentation

## 2020-03-05 DIAGNOSIS — B9789 Other viral agents as the cause of diseases classified elsewhere: Secondary | ICD-10-CM | POA: Diagnosis not present

## 2020-03-05 DIAGNOSIS — Z886 Allergy status to analgesic agent status: Secondary | ICD-10-CM | POA: Insufficient documentation

## 2020-03-05 DIAGNOSIS — F17210 Nicotine dependence, cigarettes, uncomplicated: Secondary | ICD-10-CM | POA: Diagnosis not present

## 2020-03-05 MED ORDER — METHYLPREDNISOLONE 4 MG PO TBPK
ORAL_TABLET | ORAL | 0 refills | Status: DC
Start: 2020-03-05 — End: 2020-08-25

## 2020-03-05 NOTE — ED Notes (Signed)
Pt ambulatory with steady gait to restroom. Urine specimen collected.

## 2020-03-05 NOTE — ED Provider Notes (Signed)
[facility]ER[facility] DEPARTMENT Provider Note   CSN: [id number] Arrival date & time: 03/05/20  1947     History Chief Complaint  Patient presents with  . Cough    Kayla [name] is a 74 y.o. female.  74 year old female with past medical history below including asthma, fibromyalgia, GERD, IBS who presents with cough and congestion.  Yesterday patient began having cough associated with nasal congestion, postnasal drip, and loose stools.  She felt chilled overnight.  She notes that she was around her son several days ago and he has since developed the same symptoms.  No vomiting.  She has been using her rescue inhaler which seems to help with the wheezing.  She has also taken Mucinex.  The history is provided by the patient.  Cough      Past Medical History:  Diagnosis Date  . Abscess of groin, left 11/10/2014  . Abscess of left groin 11/10/2014  . Asthma   . CAP (community acquired pneumonia) 08/2012   Dr Birdie Riddle  . Cellulitis 10/20/2014  . Diverticulitis   . Diverticulosis   . Fasting hyperglycemia 04/2011   FBS 108  . Fibromyalgia   . Fundic gland polyps of stomach, benign   . GERD (gastroesophageal reflux disease)    gastric polyp x3  . Glaucoma     Dr Bing Plume  . IBS (irritable bowel syndrome)   . Osteopenia    last 07/2011  . Pneumonia     OP as child  . Shingles     Patient Active Problem List   Diagnosis Date Noted  . Allergies 01/18/2020  . Preventative health care 03/29/2019  . Pneumonia of left lower lobe due to infectious organism 09/01/2018  . Vaginal atrophy 03/24/2018  . Seasonal allergies 02/08/2018  . Erythema nodosum 06/09/2016  . Herpes zoster 09/23/2013  . Superficial bruising of finger 02/16/2013  . Hyperlipidemia 11/01/2011  . Palpitations 05/23/2011  . Essential hypertension 04/23/2011  . Cough 04/09/2011  . THYROID FUNCTION TEST, ABNORMAL 12/20/2009  . COLONIC POLYPS, HX OF 11/08/2008  . Asthma 01/21/2008  . GERD 01/21/2008    . IRRITABLE BOWEL SYNDROME 01/21/2008  . FIBROMYALGIA 01/21/2008  . Vitamin D deficiency 04/02/2007  . DEGENERATIVE DISC DISEASE 04/02/2007  . GLAUCOMA, LOW TENSION 02/06/2007  . Osteoporosis 02/06/2007    Past Surgical History:  Procedure Laterality Date  . COLONOSCOPY W/ POLYPECTOMY  [phone]   last  colonoscopy 2007, Dr Carlean Purl  . ESOPHAGOGASTRODUODENOSCOPY    . SPINE SURGERY     T9-L5 fusions     OB History   No obstetric history on file.     Family History  Problem Relation Age of Onset  . Breast cancer Mother   . Breast cancer Sister   . Breast cancer Maternal Aunt        two  . Breast cancer Paternal Aunt   . Asthma Paternal Aunt   . Heart failure Father        CHF  . Kidney disease Maternal Grandmother   . Ovarian cancer Other        Niece x2  . Breast cancer Other        niece  . Coronary artery disease Paternal Aunt        triple CABG  . Heart attack Paternal Grandmother        MI in late 39s  . Heart disease Paternal Grandfather   . Heart attack Paternal Uncle         MI  in 79s  . Heart disease Maternal Aunt   . Pulmonary embolism Son   . Deep vein thrombosis Son   . Heart Problems Son   . Diabetes Neg Hx   . Stroke Neg Hx   . COPD Neg Hx   . Colon polyps Neg Hx     Social History   Tobacco Use  . Smoking status: Former Smoker    Packs/day: 0.50    Years: 10.00    Pack years: 5.00    Types: Cigarettes    Quit date: 09/10/1974    Years since quitting: 45.5  . Smokeless tobacco: Never Used  . Tobacco comment: smoked 1966- 1976, up to 1 ppd  Substance Use Topics  . Alcohol use: No    Alcohol/week: 0.0 standard drinks  . Drug use: No    Home Medications Prior to Admission medications   Medication Sig Start Date End Date Taking? Authorizing Provider  acetaminophen (TYLENOL) 500 MG tablet Take 500 mg by mouth as needed.    [provider]  albuterol (PROVENTIL) (2.5 MG/3ML) 0.083% nebulizer solution Take 3 mLs (2.5 mg  total) by nebulization every 6 (six) hours as needed for wheezing or shortness of breath. 09/01/18   [name], DO  albuterol (VENTOLIN HFA) 108 (90 Base) MCG/ACT inhaler Inhale 2 puffs into the lungs every 6 (six) hours as needed for wheezing or shortness of breath. As needed only 03/27/19   Carollee Herter, Kendrick Fries R, DO  amLODipine (NORVASC) 2.5 MG tablet TAKE 1 TABLET(2.5 MG) BY MOUTH DAILY 12/08/19   Carollee Herter, Alferd Apa, DO  bifidobacterium infantis (ALIGN) capsule Take 1 capsule by mouth daily. 12/29/12   Gatha Mayer, MD  carboxymethylcellulose (REFRESH PLUS) 0.5 % SOLN 1 drop 3 (three) times daily as needed.    [provider]  cholecalciferol (VITAMIN D) 1000 units tablet Take 1 capsule by mouth daily. 12/24/16   [provider]  colchicine 0.6 MG tablet Take 1 tablet (0.6 mg total) by mouth 2 (two) times daily. 1 po bid x 2-3 days then take 1 a day 05/24/16   Carollee Herter, Alferd Apa, DO  EPINEPHrine (EPIPEN 2-PAK) 0.3 mg/0.3 mL IJ SOAJ injection As directed 03/27/19   Carollee Herter, Alferd Apa, DO  famotidine (PEPCID) 10 MG tablet Take 10 mg by mouth at bedtime. Alternating with 20mg  tablets she had    [provider]  fluticasone (FLONASE) 50 MCG/ACT nasal spray USE 2 SPRAYS IN EACH  NOSTRIL ONCE A DAY 03/27/19   Carollee Herter, Alferd Apa, DO  GuaiFENesin (MUCINEX PO) Take by mouth as needed. Reported on 03/15/2016    [provider]  hyoscyamine (LEVSIN SL) 0.125 MG SL tablet Place 1 tablet (0.125 mg total) under the tongue every 6 (six) hours as needed. 06/02/19   Gatha Mayer, MD  latanoprost (XALATAN) 0.005 % ophthalmic solution Place 1 drop into both eyes at bedtime.    [provider]  methylPREDNISolone (MEDROL DOSEPAK) 4 MG TBPK tablet Take as directed on box 03/05/20   Roldan Laforest, Wenda Overland, MD  metoprolol tartrate (LOPRESSOR) 25 MG tablet 1 po bid 10/22/19   Lowne Chase, Yvonne R, DO  montelukast (SINGULAIR) 10 MG tablet TAKE 1 TABLET(10 MG)  BY MOUTH DAILY. 01/18/20   [name], DO  Multiple Minerals-Vitamins (CALCIUM CITRATE PLUS/MAGNESIUM PO) Take by mouth.    [provider]  nystatin cream (MYCOSTATIN) Apply 1 application topically 2 (two) times daily. 01/18/20   Lowne  Lyndal Pulley R, DO  Omega-3 Fatty Acids (FISH OIL PO) Take by mouth.    [provider]  Polyethyl Glycol-Propyl Glycol (SYSTANE) 0.4-0.3 % GEL ophthalmic gel Place 1 application into both eyes.    [provider]  sodium chloride (OCEAN) 0.65 % SOLN nasal spray Place 1 spray into both nostrils as needed for congestion.    [provider]  triamcinolone cream (KENALOG) 0.1 % Apply 1 application topically 2 (two) times daily. 06/09/18   [name], DO  Vitamin Mixture (ESTER-C PO) Take 1,000 mg by mouth daily.    [provider]    Allergies    Cephalosporins, Gabapentin, Levofloxacin, Psyllium, Sulfonamide derivatives, Aspirin, Belladonna, Ciprofloxacin hcl, Conjugated estrogens, Doxycycline, Flovent hfa [fluticasone], Nabumetone, Nsaids, Soybean-containing drug products, Zicam cold remedy [homeopathic products], Zocor [simvastatin], and Prednisone  Review of Systems   Review of Systems  Respiratory: Positive for cough.    All other systems reviewed and are negative except that which was mentioned in HPI  Physical Exam Updated Vital Signs BP (!) 172/80 (BP Location: Right Arm)   Pulse 64   Temp 99 F (37.2 C) (Oral)   Resp 18   Wt 71.1 kg   SpO2 100%   BMI 29.61 kg/m   Physical Exam Vitals and nursing note reviewed.  Constitutional:      General: She is not in acute distress.    Appearance: She is well-developed.  HENT:     Head: Normocephalic and atraumatic.     Mouth/Throat:     Mouth: Mucous membranes are moist.     Pharynx: Oropharynx is clear. No oropharyngeal exudate or posterior oropharyngeal erythema.  Eyes:     Conjunctiva/sclera: Conjunctivae normal.  Cardiovascular:       Rate and Rhythm: Normal rate and regular rhythm.     Heart sounds: Normal heart sounds. No murmur heard.   Pulmonary:     Effort: Pulmonary effort is normal.     Comments: Faint end expiratory wheezes in bilateral bases, normal work of breathing Abdominal:     General: Bowel sounds are normal. There is no distension.     Palpations: Abdomen is soft.     Tenderness: There is no abdominal tenderness.  Musculoskeletal:     Cervical back: Neck supple.     Right lower leg: No edema.     Left lower leg: No edema.  Skin:    General: Skin is warm and dry.  Neurological:     Mental Status: She is alert and oriented to person, place, and time.     Comments: Fluent speech  Psychiatric:        Judgment: Judgment normal.     ED Results / Procedures / Treatments   Labs (all labs ordered are listed, but only abnormal results are displayed) Labs Reviewed  SARS CORONAVIRUS 2 BY RT PCR (HOSPITAL ORDER, [facility])    EKG None  Radiology DG Chest Portable 1 View  Result Date: 03/05/2020 CLINICAL DATA:  Persistent cough, loose stools, former smoker and asthmatic EXAM: PORTABLE CHEST 1 VIEW COMPARISON:  Radiograph 09/01/2018 FINDINGS: No consolidation, features of edema, pneumothorax, or effusion. The cardiomediastinal contours are unremarkable. Normal pulmonary vascularity. Long segment thoracolumbar fusion is again noted with discontinuity of the left fusion rod similar to comparison radiograph.No acute osseous or soft tissue abnormality. IMPRESSION: 1. No acute cardiopulmonary disease. 2. Long segment thoracolumbar fusion. Discontinuity of the left fusion rod similar to comparison radiograph. Electronically Signed  By: Lovena Le M.D.   On: 03/05/2020 22:37    Procedures Procedures (including critical care time)  Medications Ordered in ED Medications - No data to display  ED Course  I have reviewed the triage vital signs and the nursing  notes.  Pertinent labs & imaging results that were available during my care of the patient were reviewed by me and considered in my medical decision making (see chart for details).    MDM Rules/Calculators/A&P                          Well-appearing and breathing comfortably on exam, afebrile, normal O2 saturation.  She had occasional end expiratory wheezing.  Chest x-ray clear.  Symptoms suggestive of viral URI.  Given the current pandemic, I did recommend outpatient COVID-19 testing.  Because of her wheezing on exam and need for rescue inhaler over the past day, recommended steroid course.  She has allergy listed to prednisone, states that she has tolerated Medrol dose pack in the past therefore I prescribed this and instructed to continue albuterol on a schedule at home.  Discussed supportive measures.  Reviewed return precautions and she voiced understanding.  MELAINA HOWERTON was evaluated in Emergency Department on 03/05/2020 for the symptoms described in the history of present illness. She was evaluated in the context of the global COVID-19 pandemic, which necessitated consideration that the patient might be at risk for infection with the SARS-CoV-2 virus that causes COVID-19. Institutional protocols and algorithms that pertain to the evaluation of patients at risk for COVID-19 are in a state of rapid change based on information released by regulatory bodies including the CDC and federal and state organizations. These policies and algorithms were followed during the patient's care in the ED.  Final Clinical Impression(s) / ED Diagnoses Final diagnoses:  Viral URI with cough  Mild intermittent asthma with exacerbation    Rx / DC Orders ED Discharge Orders         Ordered    methylPREDNISolone (MEDROL DOSEPAK) 4 MG TBPK tablet     Discontinue  Reprint     03/05/20 [place], Wenda Overland, MD 03/05/20 2314

## 2020-03-05 NOTE — ED Triage Notes (Addendum)
Pt reports allergy symptoms, congestion and cough since yesterday. Reports albuterol inhaler, musinex effective. Speaks in full sentences without difficulty. Also reports diarrhea

## 2020-03-06 LAB — SARS CORONAVIRUS 2 BY RT PCR (HOSPITAL ORDER, PERFORMED IN ~~LOC~~ HOSPITAL LAB): SARS Coronavirus 2: NEGATIVE

## 2020-03-08 ENCOUNTER — Other Ambulatory Visit: Payer: Self-pay

## 2020-03-08 ENCOUNTER — Encounter: Payer: Self-pay | Admitting: Family Medicine

## 2020-03-08 ENCOUNTER — Ambulatory Visit (INDEPENDENT_AMBULATORY_CARE_PROVIDER_SITE_OTHER): Payer: Medicare Other | Admitting: Family Medicine

## 2020-03-08 VITALS — BP 160/100 | HR 84 | Temp 97.4°F | Resp 18 | Ht 61.0 in | Wt 155.2 lb

## 2020-03-08 DIAGNOSIS — J4521 Mild intermittent asthma with (acute) exacerbation: Secondary | ICD-10-CM | POA: Diagnosis not present

## 2020-03-08 DIAGNOSIS — J4520 Mild intermittent asthma, uncomplicated: Secondary | ICD-10-CM

## 2020-03-08 MED ORDER — AZITHROMYCIN 250 MG PO TABS: ORAL_TABLET | ORAL | 0 refills | Status: DC

## 2020-03-08 MED ORDER — ALBUTEROL SULFATE (2.5 MG/3ML) 0.083% IN NEBU: 2.5000 mg | INHALATION_SOLUTION | Freq: Four times a day (QID) | RESPIRATORY_TRACT | 12 refills | Status: DC | PRN

## 2020-03-08 MED ORDER — METHYLPREDNISOLONE ACETATE 80 MG/ML IJ SUSP
80.0000 mg | Freq: Once | INTRAMUSCULAR | Status: AC
  Administered 2020-03-08: 80 mg via INTRAMUSCULAR

## 2020-03-08 NOTE — Patient Instructions (Signed)
Acute Bronchitis, Adult  Acute bronchitis is sudden or acute swelling of the air tubes (bronchi) in the lungs. Acute bronchitis causes these tubes to fill with mucus, which can make it hard to breathe. It can also cause coughing or wheezing. In adults, acute bronchitis usually goes away within 2 weeks. A cough caused by bronchitis may last up to 3 weeks. Smoking, allergies, and asthma can make the condition worse. What are the causes? This condition can be caused by germs and by substances that irritate the lungs, including:  Cold and flu viruses. The most common cause of this condition is the virus that causes the common cold.  Bacteria.  Substances that irritate the lungs, including: ? Smoke from cigarettes and other forms of tobacco. ? Dust and pollen. ? Fumes from chemical products, gases, or burned fuel. ? Other materials that pollute indoor or outdoor air.  Close contact with someone who has acute bronchitis. What increases the risk? The following factors may make you more likely to develop this condition:  A weak body's defense system, also called the immune system.  A condition that affects your lungs and breathing, such as asthma. What are the signs or symptoms? Common symptoms of this condition include:  Lung and breathing problems, such as: ? Coughing. This may bring up clear, yellow, or green mucus from your lungs (sputum). ? Wheezing. ? Having too much mucus in your lungs (chest congestion). ? Having shortness of breath.  A fever.  Chills.  Aches and pains, including: ? Tightness in your chest and other body aches. ? A sore throat. How is this diagnosed? This condition is usually diagnosed based on:  Your symptoms and medical history.  A physical exam. You may also have other tests, including tests to rule out other conditions, such pneumonia. These tests include:  A test of lung function.  Test of a mucus sample to look for the presence of  bacteria.  Tests to check the oxygen level in your blood.  Blood tests.  Chest X-ray. How is this treated? Most cases of acute bronchitis clear up over time without treatment. Your health care provider may recommend:  Drinking more fluids. This can thin your mucus, which may improve your breathing.  Taking a medicine for a fever or cough.  Using a device that gets medicine into your lungs (inhaler) to help improve breathing and control coughing.  Using a vaporizer or a humidifier. These are machines that add water to the air to help you breathe better. Follow these instructions at home: Activity  Get plenty of rest.  Return to your normal activities as told by your health care provider. Ask your health care provider what activities are safe for you. Lifestyle  Drink enough fluid to keep your urine pale yellow.  Do not drink alcohol.  Do not use any products that contain nicotine or tobacco, such as cigarettes, e-cigarettes, and chewing tobacco. If you need help quitting, ask your health care provider. Be aware that: ? Your bronchitis will get worse if you smoke or breathe in other people's smoke (secondhand smoke). ? Your lungs will heal faster if you quit smoking. General instructions   Take over-the-counter and prescription medicines only as told by your health care provider.  Use an inhaler, vaporizer, or humidifier as told by your health care provider.  If you have a sore throat, gargle with a salt-water mixture 3-4 times a day or as needed. To make a salt-water mixture, completely dissolve -1 tsp (3-6   g) of salt in 1 cup (237 mL) of warm water.  Keep all follow-up visits as told by your health care provider. This is important. How is this prevented? To lower your risk of getting this condition again:  Wash your hands often with soap and water. If soap and water are not available, use hand sanitizer.  Avoid contact with people who have cold symptoms.  Try not to  touch your mouth, nose, or eyes with your hands.  Avoid places where there are fumes from chemicals. Breathing these fumes will make your condition worse.  Get the flu shot every year. Contact a health care provider if:  Your symptoms do not improve after 2 weeks of treatment.  You vomit more than once or twice.  You have symptoms of dehydration such as: ? Dark urine. ? Dry skin or eyes. ? Increased thirst. ? Headaches. ? Confusion. ? Muscle cramps. Get help right away if you:  Cough up blood.  Feel pain in your chest.  Have severe shortness of breath.  Faint or keep feeling like you are going to faint.  Have a severe headache.  Have fever or chills that get worse. These symptoms may represent a serious problem that is an emergency. Do not wait to see if the symptoms will go away. Get medical help right away. Call your local emergency services (911 in the [place] Do not drive yourself to the hospital. Summary  Acute bronchitis is sudden (acute) inflammation of the air tubes (bronchi) between the windpipe and the lungs. In adults, acute bronchitis usually goes away within 2 weeks, although coughing may last 3 weeks or longer  Take over-the-counter and prescription medicines only as told by your health care provider.  Drink enough fluid to keep your urine pale yellow.  Contact a health care provider if your symptoms do not improve after 2 weeks of treatment.  Get help right away if you cough up blood, faint, or have chest pain or shortness of breath. This information is not intended to replace advice given to you by your health care provider. Make sure you discuss any questions you have with your health care provider. Document Revised: 05/11/2019 Document Reviewed: 03/20/2019 [employer] Patient Education  2020 [employer]  

## 2020-03-09 ENCOUNTER — Encounter: Payer: Self-pay | Admitting: Family Medicine

## 2020-03-09 DIAGNOSIS — J45909 Unspecified asthma, uncomplicated: Secondary | ICD-10-CM | POA: Insufficient documentation

## 2020-03-09 NOTE — Progress Notes (Signed)
Patient ID: Kayla [name], female    DOB: 1945-11-21  Age: 74 y.o. MRN: [id number]    Subjective:  Subjective  HPI [name] presents for f/u er for bronchitis / asthma exacerbation.    Review of Systems  Constitutional: Negative for appetite change, diaphoresis, fatigue and unexpected weight change.  Eyes: Negative for pain, redness and visual disturbance.  Respiratory: Positive for cough and wheezing. Negative for chest tightness and shortness of breath.   Cardiovascular: Negative for chest pain, palpitations and leg swelling.  Endocrine: Negative for cold intolerance, heat intolerance, polydipsia, polyphagia and polyuria.  Genitourinary: Negative for difficulty urinating, dysuria and frequency.  Neurological: Negative for dizziness, light-headedness, numbness and headaches.    History Past Medical History:  Diagnosis Date  . Abscess of groin, left 11/10/2014  . Abscess of left groin 11/10/2014  . Asthma   . CAP (community acquired pneumonia) 08/2012   Dr Birdie Riddle  . Cellulitis 10/20/2014  . Diverticulitis   . Diverticulosis   . Fasting hyperglycemia 04/2011   FBS 108  . Fibromyalgia   . Fundic gland polyps of stomach, benign   . GERD (gastroesophageal reflux disease)    gastric polyp x3  . Glaucoma     Dr Bing Plume  . IBS (irritable bowel syndrome)   . Osteopenia    last 07/2011  . Pneumonia     OP as child  . Shingles     She has a past surgical history that includes Spine surgery; Colonoscopy w/ polypectomy (5465,[id number],6568); and Esophagogastroduodenoscopy.   Her family history includes Asthma in her paternal aunt; Breast cancer in her maternal aunt, mother, paternal aunt, sister, and another family member; Coronary artery disease in her paternal aunt; Deep vein thrombosis in her son; Heart Problems in her son; Heart attack in her paternal grandmother and paternal uncle; Heart disease in her maternal aunt and paternal grandfather; Heart failure in her father; Kidney  disease in her maternal grandmother; Ovarian cancer in an other family member; Pulmonary embolism in her son.She reports that she quit smoking about 45 years ago. Her smoking use included cigarettes. She has a 5.00 pack-year smoking history. She has never used smokeless tobacco. She reports that she does not drink alcohol and does not use drugs.  Current Outpatient Medications on File Prior to Visit  Medication Sig Dispense Refill  . acetaminophen (TYLENOL) 500 MG tablet Take 500 mg by mouth as needed.    Marland Kitchen albuterol (VENTOLIN HFA) 108 (90 Base) MCG/ACT inhaler Inhale 2 puffs into the lungs every 6 (six) hours as needed for wheezing or shortness of breath. As needed only 18 g 3  . amLODipine (NORVASC) 2.5 MG tablet TAKE 1 TABLET(2.5 MG) BY MOUTH DAILY 90 tablet 0  . bifidobacterium infantis (ALIGN) capsule Take 1 capsule by mouth daily. 21 capsule 0  . carboxymethylcellulose (REFRESH PLUS) 0.5 % SOLN 1 drop 3 (three) times daily as needed.    . cholecalciferol (VITAMIN D) 1000 units tablet Take 1 capsule by mouth daily.    . colchicine 0.6 MG tablet Take 1 tablet (0.6 mg total) by mouth 2 (two) times daily. 1 po bid x 2-3 days then take 1 a day 30 tablet 0  . EPINEPHrine (EPIPEN 2-PAK) 0.3 mg/0.3 mL IJ SOAJ injection As directed 1 each 0  . famotidine (PEPCID) 10 MG tablet Take 10 mg by mouth at bedtime. Alternating with 20mg  tablets she had    . fluticasone (FLONASE) 50 MCG/ACT nasal spray USE 2 SPRAYS IN [facility]  NOSTRIL ONCE A DAY 48 g 3  . GuaiFENesin (MUCINEX PO) Take by mouth as needed. Reported on 03/15/2016    . hyoscyamine (LEVSIN SL) 0.125 MG SL tablet Place 1 tablet (0.125 mg total) under the tongue every 6 (six) hours as needed. 10 tablet 1  . latanoprost (XALATAN) 0.005 % ophthalmic solution Place 1 drop into both eyes at bedtime.    . methylPREDNISolone (MEDROL DOSEPAK) 4 MG TBPK tablet Take as directed on box 21 each 0  . metoprolol tartrate (LOPRESSOR) 25 MG tablet 1 po bid 180 tablet 2   . montelukast (SINGULAIR) 10 MG tablet TAKE 1 TABLET(10 MG) BY MOUTH DAILY. 90 tablet 2  . Multiple Minerals-Vitamins (CALCIUM CITRATE PLUS/MAGNESIUM PO) Take by mouth.    . nystatin cream (MYCOSTATIN) Apply 1 application topically 2 (two) times daily. 90 g 3  . Omega-3 Fatty Acids (FISH OIL PO) Take by mouth.    Vladimir Faster Glycol-Propyl Glycol (SYSTANE) 0.4-0.3 % GEL ophthalmic gel Place 1 application into both eyes.    . sodium chloride (OCEAN) 0.65 % SOLN nasal spray Place 1 spray into both nostrils as needed for congestion.    . triamcinolone cream (KENALOG) 0.1 % Apply 1 application topically 2 (two) times daily. 90 g 3  . Vitamin Mixture (ESTER-C PO) Take 1,000 mg by mouth daily.     No current facility-administered medications on file prior to visit.     Objective:  Objective  Physical Exam Vitals and nursing note reviewed.  Constitutional:      Appearance: She is well-developed.  HENT:     Head: Normocephalic and atraumatic.  Eyes:     Conjunctiva/sclera: Conjunctivae normal.  Neck:     Thyroid: No thyromegaly.     Vascular: No carotid bruit or JVD.  Cardiovascular:     Rate and Rhythm: Normal rate and regular rhythm.     Heart sounds: Normal heart sounds. No murmur heard.   Pulmonary:     Effort: Pulmonary effort is normal. No respiratory distress.     Breath sounds: Wheezing present. No rales.  Chest:     Chest wall: No tenderness.  Musculoskeletal:     Cervical back: Normal range of motion and neck supple.  Neurological:     Mental Status: She is alert and oriented to person, place, and time.    BP (!) 160/100 (BP Location: Left Arm, Patient Position: Sitting, Cuff Size: Normal)   Pulse 84   Temp (!) 97.4 F (36.3 C) (Temporal)   Resp 18   Ht 5\' 1"  (1.549 m)   Wt 155 lb 3.2 oz (70.4 kg)   SpO2 96%   BMI 29.32 kg/m  Wt Readings from Last 3 Encounters:  03/08/20 155 lb 3.2 oz (70.4 kg)  03/05/20 156 lb 11.2 oz (71.1 kg)  01/18/20 151 lb 9.6 oz (68.8  kg)     Lab Results  Component Value Date   WBC 5.0 11/26/2018   HGB 14.1 11/26/2018   HCT 41.6 11/26/2018   PLT 170.0 11/26/2018   GLUCOSE 84 01/18/2020   CHOL 218 (H) 01/18/2020   TRIG 152.0 (H) 01/18/2020   HDL 60.80 01/18/2020   LDLDIRECT 153.3 12/18/2012   LDLCALC 127 (H) 01/18/2020   ALT 14 01/18/2020   AST 21 01/18/2020   NA 138 01/18/2020   K 4.1 01/18/2020   CL 101 01/18/2020   CREATININE 0.74 01/18/2020   BUN 16 01/18/2020   CO2 28 01/18/2020   TSH 3.24 01/02/2018  INR 0.97 12/15/2009   HGBA1C 5.4 11/01/2011    DG Chest Portable 1 View  Result Date: 03/05/2020 CLINICAL DATA:  Persistent cough, loose stools, former smoker and asthmatic EXAM: PORTABLE CHEST 1 VIEW COMPARISON:  Radiograph 09/01/2018 FINDINGS: No consolidation, features of edema, pneumothorax, or effusion. The cardiomediastinal contours are unremarkable. Normal pulmonary vascularity. Long segment thoracolumbar fusion is again noted with discontinuity of the left fusion rod similar to comparison radiograph.No acute osseous or soft tissue abnormality. IMPRESSION: 1. No acute cardiopulmonary disease. 2. Long segment thoracolumbar fusion. Discontinuity of the left fusion rod similar to comparison radiograph. Electronically Signed   By: Lovena Le M.D.   On: 03/05/2020 22:37     Assessment & Plan:  Plan  I am having Jatoria M. Lincks start on azithromycin. I am also having her maintain her GuaiFENesin (MUCINEX PO), acetaminophen, bifidobacterium infantis, Omega-3 Fatty Acids (FISH OIL PO), Multiple Minerals-Vitamins (CALCIUM CITRATE PLUS/MAGNESIUM PO), sodium chloride, carboxymethylcellulose, colchicine, Vitamin Mixture (ESTER-C PO), cholecalciferol, Polyethyl Glycol-Propyl Glycol, latanoprost, triamcinolone cream, famotidine, fluticasone, EPINEPHrine, albuterol, hyoscyamine, metoprolol tartrate, amLODipine, nystatin cream, montelukast, methylPREDNISolone, and albuterol. We administered methylPREDNISolone  acetate.  Meds ordered this encounter  Medications  . azithromycin (ZITHROMAX Z-PAK) 250 MG tablet    Sig: As directed    Dispense:  6 each    Refill:  0  . albuterol (PROVENTIL) (2.5 MG/3ML) 0.083% nebulizer solution    Sig: Take 3 mLs (2.5 mg total) by nebulization every 6 (six) hours as needed for wheezing or shortness of breath.    Dispense:  75 mL    Refill:  12  . methylPREDNISolone acetate (DEPO-MEDROL) injection 80 mg    Problem List Items Addressed This Visit      Unprioritized   Asthma   Relevant Medications   albuterol (PROVENTIL) (2.5 MG/3ML) 0.083% nebulizer solution   Asthmatic bronchitis - Primary    Depo medrol IM z pack Refill albuterol for neb Finish pred taper Call back prn       Relevant Medications   azithromycin (ZITHROMAX Z-PAK) 250 MG tablet   albuterol (PROVENTIL) (2.5 MG/3ML) 0.083% nebulizer solution      Follow-up: Return in about 3 weeks (around 03/29/2020), or if symptoms worsen or fail to improve.  [name], DO

## 2020-03-09 NOTE — Assessment & Plan Note (Signed)
Depo medrol IM z pack Refill albuterol for neb Finish pred taper Call back prn

## 2020-03-10 ENCOUNTER — Encounter: Payer: Self-pay | Admitting: Family Medicine

## 2020-03-10 NOTE — Telephone Encounter (Signed)
Ok to pick up abx

## 2020-03-15 ENCOUNTER — Other Ambulatory Visit: Payer: Self-pay | Admitting: Family Medicine

## 2020-03-15 DIAGNOSIS — I10 Essential (primary) hypertension: Secondary | ICD-10-CM

## 2020-03-19 IMAGING — DX DG FOOT COMPLETE 3+V*L*
3 series · 3 of 3 positions shown · non-contrast
Comparison: None.

CLINICAL DATA: Left foot pain

EXAM:
LEFT FOOT - COMPLETE 3+ VIEW

[foot ap]
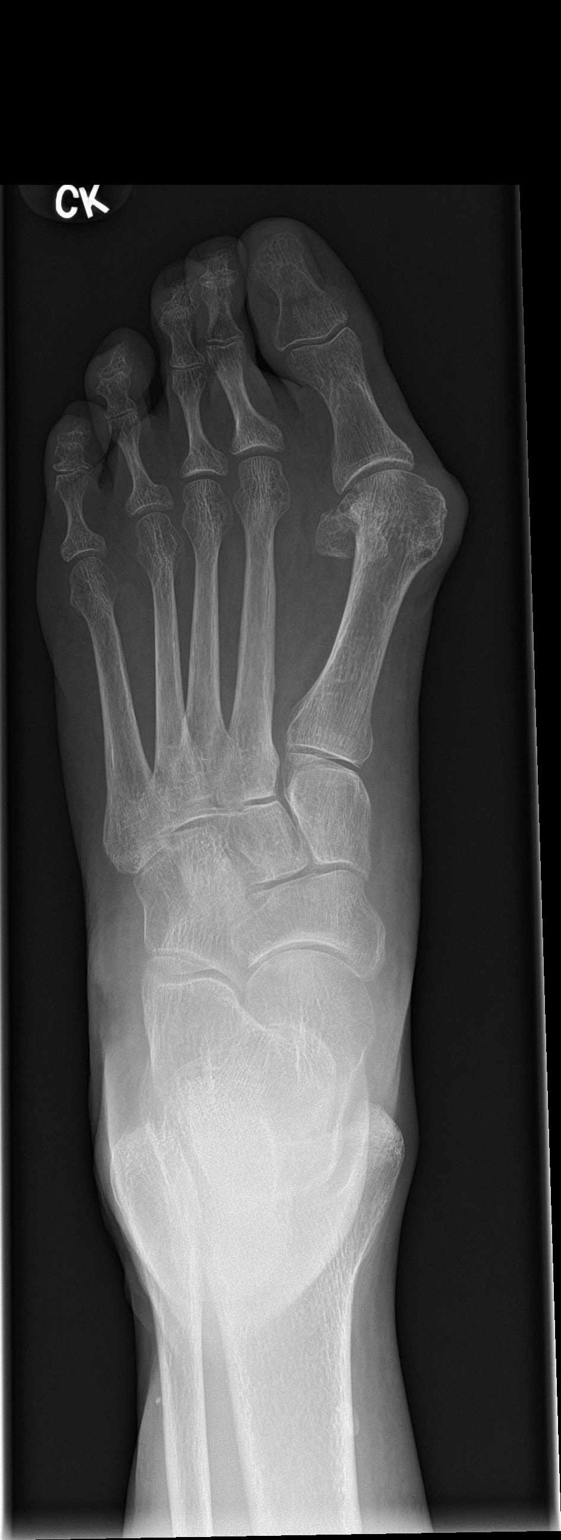

[foot obl]
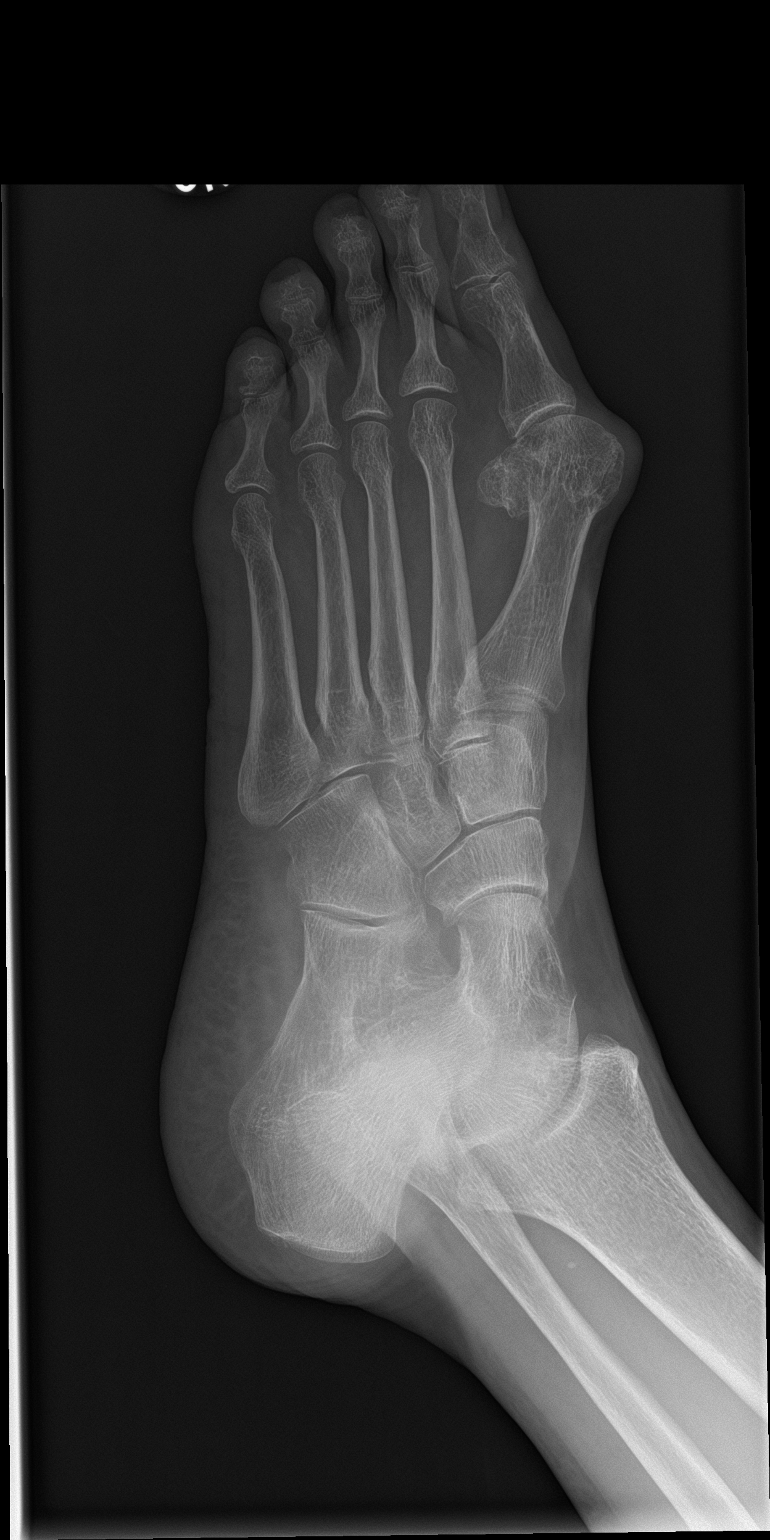

[foot lat]
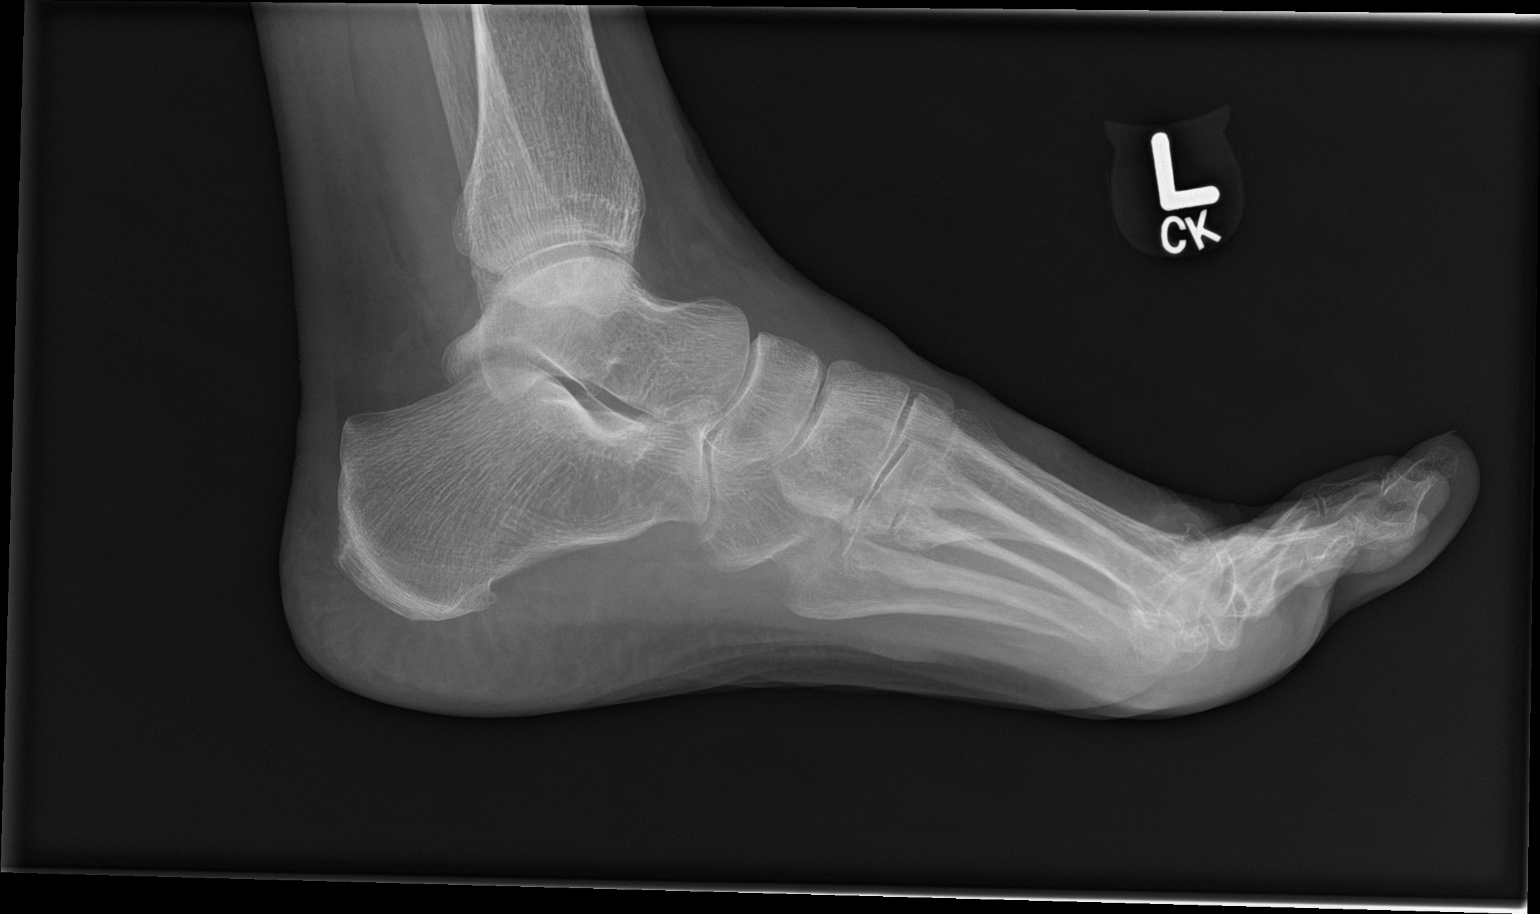

[3 of 3 positions shown; findings below may reference images not displayed]

FINDINGS: Marked hallux valgus deformity. Mild soft tissue swelling over the
distal first metatarsal. Mild dorsal spurring first
metatarsophalangeal joint.

Negative for fracture.  No other arthropathy.
IMPRESSION: Marked hallux valgus deformity.  No acute abnormality.

## 2020-03-29 ENCOUNTER — Other Ambulatory Visit: Payer: Self-pay

## 2020-03-29 ENCOUNTER — Telehealth (INDEPENDENT_AMBULATORY_CARE_PROVIDER_SITE_OTHER): Payer: Medicare Other | Admitting: Family Medicine

## 2020-03-29 ENCOUNTER — Encounter: Payer: Self-pay | Admitting: Family Medicine

## 2020-03-29 VITALS — BP 131/55 | HR 53 | Ht 61.0 in | Wt 150.2 lb

## 2020-03-29 DIAGNOSIS — J4520 Mild intermittent asthma, uncomplicated: Secondary | ICD-10-CM | POA: Diagnosis not present

## 2020-03-29 NOTE — Progress Notes (Signed)
Virtual Visit via Video Note  I connected with Kayla [name] on 03/29/20 at 10:20 AM EDT by a video enabled telemedicine application and verified that I am speaking with the correct person using two identifiers.  Location: Patient: home alone  Provider: office    I discussed the limitations of evaluation and management by telemedicine and the availability of in person appointments. The patient expressed understanding and agreed to proceed.  History of Present Illness: Pt is home -- we had to switch to tele visit due to complications with caregility  Pt is feeling much better -- the cough has almost completely resolved    Observations/Objective: Vitals:   03/29/20 1016  BP: (!) 131/55  Pulse: (!) 53  SpO2: 98%    Pt is in NAD  Assessment and Plan:  1. Mild intermittent asthmatic bronchitis without complication-- post infectious cough  con't claritin and inhalers - prn  con't flonase -- or switch to rhinocort or nasacort    Follow Up Instructions:    I discussed the assessment and treatment plan with the patient. The patient was provided an opportunity to ask questions and all were answered. The patient agreed with the plan and demonstrated an understanding of the instructions.   The patient was advised to call back or seek an in-person evaluation if the symptoms worsen or if the condition fails to improve as anticipated.  I provided 25 minutes of non-face-to-face time during this encounter.   [name], DO

## 2020-05-12 IMAGING — DX DG CHEST 2V
2 series · 2 of 2 positions shown · non-contrast
Comparison: PA and lateral chest x-ray March 05, 2018

CLINICAL DATA: Chest tightness. Recently diagnosed with bronchitis.
Abnormal chest exam. Nonsmoker.

EXAM:
CHEST - 2 VIEW

[chest pa]
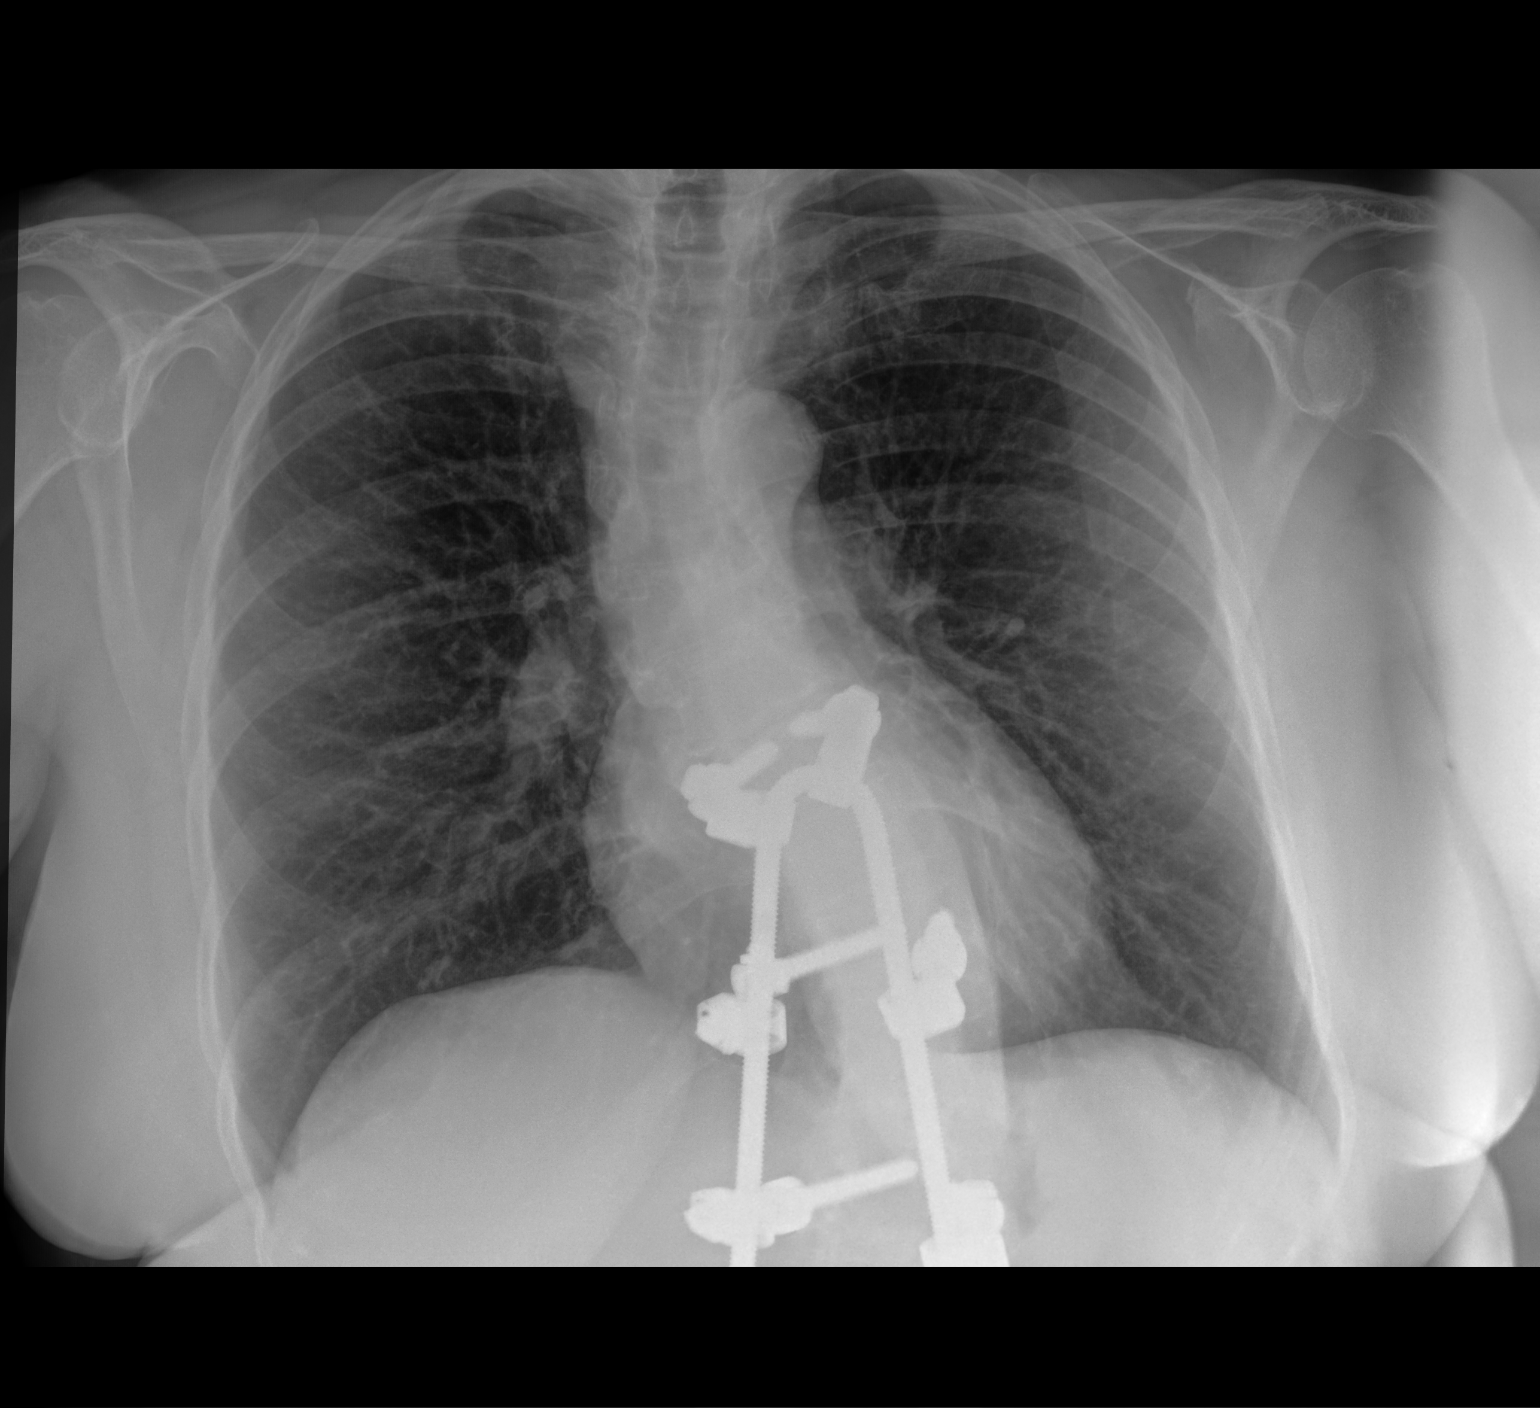

[chest lat]
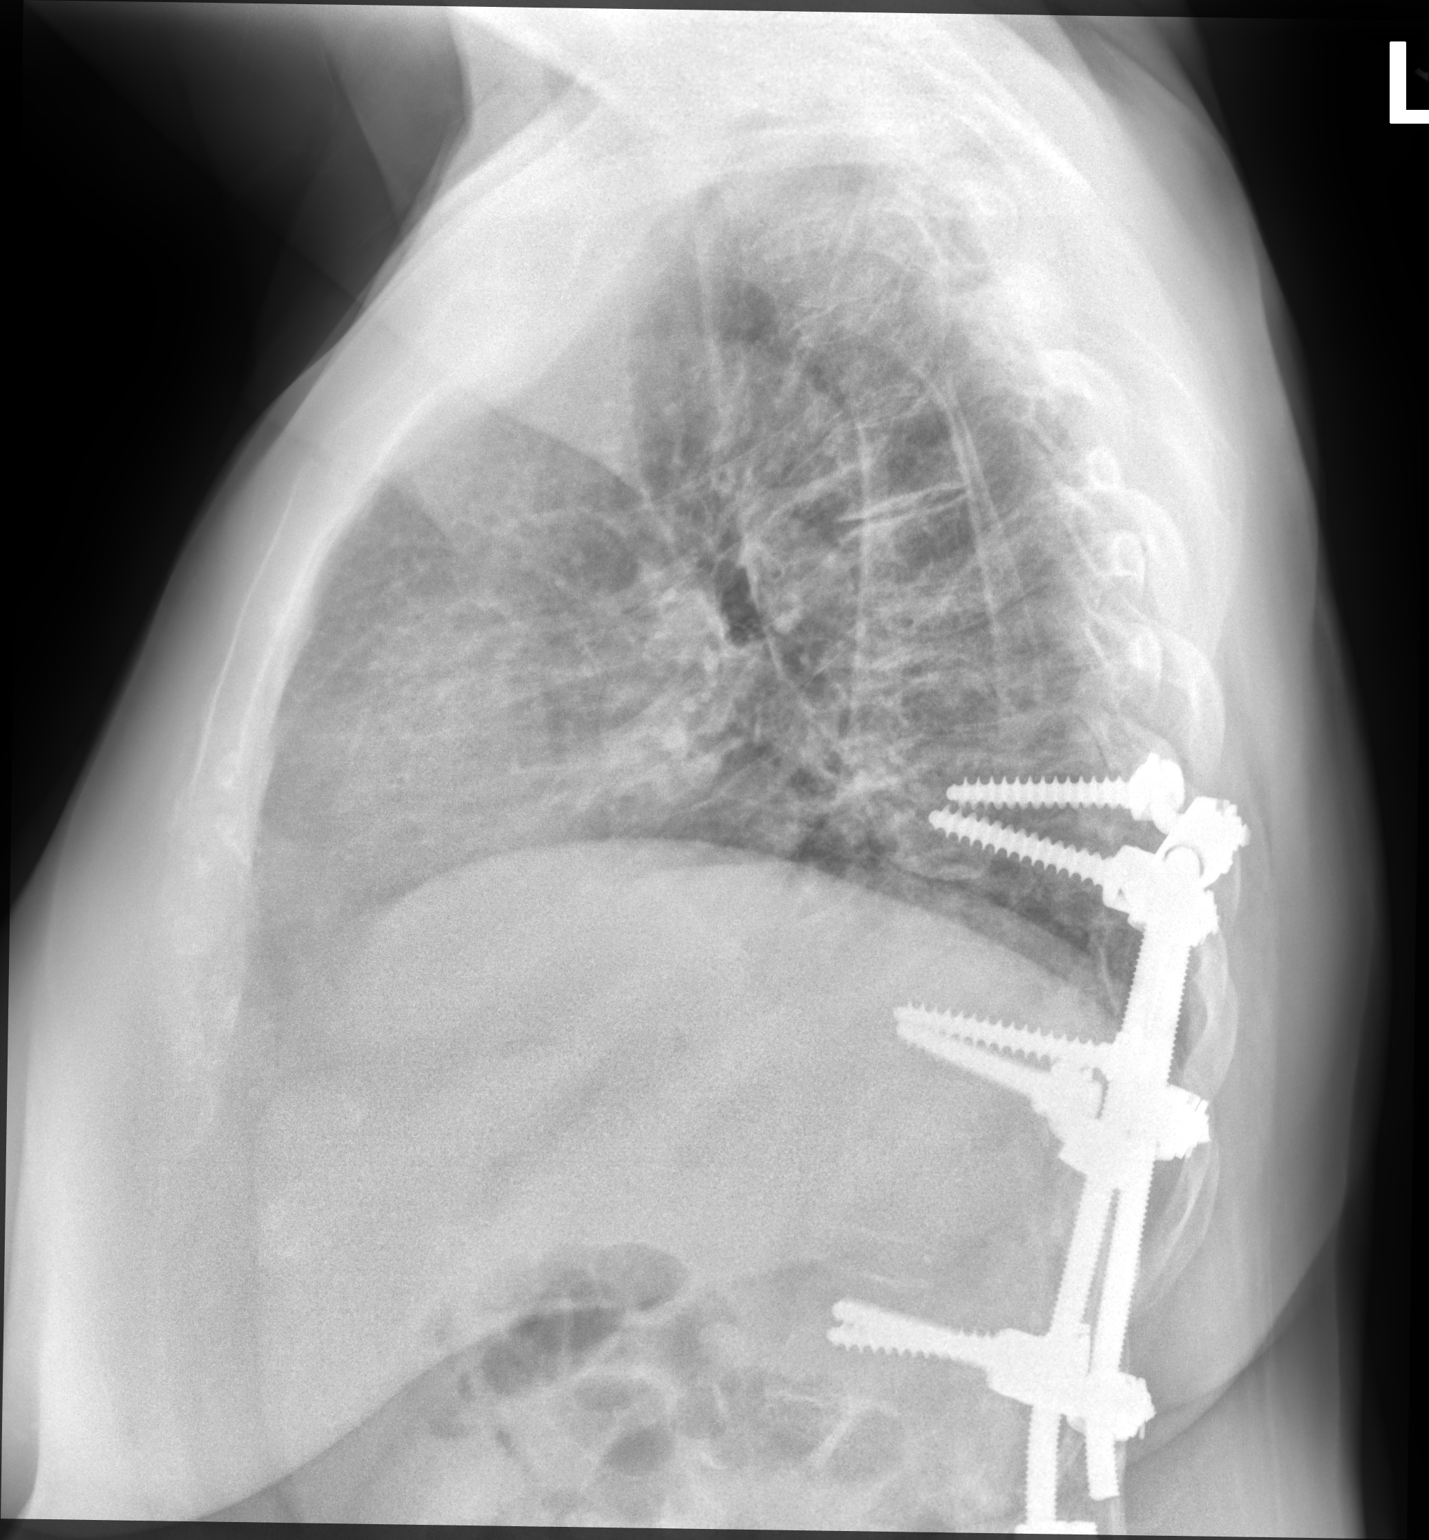

[2 of 2 positions shown; findings below may reference images not displayed]

FINDINGS: The lungs are well-expanded. The interstitial markings are mildly
prominent though stable. There is no alveolar infiltrate or pleural
effusion. The heart and pulmonary vascularity are normal. The
patient has undergone previous posterior lower thoracic and lumbar
fusion. The left-sided Harrington rod is fractured near the
thoracolumbar junction. This is chronic.
IMPRESSION: Mild interstitial prominence slightly more conspicuous in the left
lower lobe today. This may reflect atelectasis or developing
pneumonia superimposed upon chronic bronchitic changes. Followup PA
and lateral chest X-ray is recommended in 3-4 weeks following trial
of antibiotic therapy to ensure resolution and exclude underlying
malignancy.

## 2020-06-06 ENCOUNTER — Other Ambulatory Visit: Payer: Self-pay | Admitting: Family Medicine

## 2020-06-06 ENCOUNTER — Encounter: Payer: Self-pay | Admitting: Family Medicine

## 2020-06-06 DIAGNOSIS — E785 Hyperlipidemia, unspecified: Secondary | ICD-10-CM

## 2020-06-06 NOTE — Telephone Encounter (Signed)
Per her labs--- repeat in 3-6 months So it is in there Future orders in

## 2020-06-06 NOTE — Telephone Encounter (Signed)
Please place future labs 

## 2020-06-08 ENCOUNTER — Encounter: Payer: Self-pay | Admitting: Family Medicine

## 2020-06-08 DIAGNOSIS — Z803 Family history of malignant neoplasm of breast: Secondary | ICD-10-CM | POA: Diagnosis not present

## 2020-06-08 DIAGNOSIS — Z1231 Encounter for screening mammogram for malignant neoplasm of breast: Secondary | ICD-10-CM | POA: Diagnosis not present

## 2020-06-08 LAB — HM MAMMOGRAPHY

## 2020-06-15 ENCOUNTER — Other Ambulatory Visit: Payer: Self-pay

## 2020-06-15 ENCOUNTER — Other Ambulatory Visit: Payer: Medicare Other

## 2020-06-15 DIAGNOSIS — E785 Hyperlipidemia, unspecified: Secondary | ICD-10-CM

## 2020-06-16 LAB — COMPREHENSIVE METABOLIC PANEL
AG Ratio: 2 (calc) (ref 1.0–2.5)
ALT: 14 U/L (ref 6–29)
AST: 20 U/L (ref 10–35)
Albumin: 4.2 g/dL (ref 3.6–5.1)
Alkaline phosphatase (APISO): 73 U/L (ref 37–153)
BUN: 17 mg/dL (ref 7–25)
CO2: 30 mmol/L (ref 20–32)
Calcium: 9.5 mg/dL (ref 8.6–10.4)
Chloride: 104 mmol/L (ref 98–110)
Creat: 0.77 mg/dL (ref 0.60–0.93)
Globulin: 2.1 g/dL (calc) (ref 1.9–3.7)
Glucose, Bld: 87 mg/dL (ref 65–99)
Potassium: 6 mmol/L — ABNORMAL HIGH (ref 3.5–5.3)
Sodium: 138 mmol/L (ref 135–146)
Total Bilirubin: 0.6 mg/dL (ref 0.2–1.2)
Total Protein: 6.3 g/dL (ref 6.1–8.1)

## 2020-06-16 LAB — LIPID PANEL
Cholesterol: 210 mg/dL — ABNORMAL HIGH (ref <200)
HDL: 65 mg/dL (ref >=50)
LDL Cholesterol (Calc): 125 mg/dL (calc) — ABNORMAL HIGH
Non-HDL Cholesterol (Calc): 145 mg/dL (calc) — ABNORMAL HIGH (ref <130)
Total CHOL/HDL Ratio: 3.2 (calc) (ref <5.0)
Triglycerides: 94 mg/dL (ref <150)

## 2020-06-17 ENCOUNTER — Encounter: Payer: Self-pay | Admitting: Family Medicine

## 2020-06-17 ENCOUNTER — Other Ambulatory Visit: Payer: Self-pay | Admitting: Family Medicine

## 2020-06-17 DIAGNOSIS — E875 Hyperkalemia: Secondary | ICD-10-CM

## 2020-06-17 NOTE — Telephone Encounter (Signed)
Most of the time --- -potassium is high because blood sat too long in the lab  That is why we need to repeat it first

## 2020-06-20 ENCOUNTER — Other Ambulatory Visit (INDEPENDENT_AMBULATORY_CARE_PROVIDER_SITE_OTHER): Payer: Medicare Other

## 2020-06-20 ENCOUNTER — Other Ambulatory Visit: Payer: Self-pay

## 2020-06-20 DIAGNOSIS — E875 Hyperkalemia: Secondary | ICD-10-CM

## 2020-06-20 LAB — BASIC METABOLIC PANEL
BUN: 17 mg/dL (ref 7–25)
CO2: 31 mmol/L (ref 20–32)
Calcium: 9.6 mg/dL (ref 8.6–10.4)
Chloride: 102 mmol/L (ref 98–110)
Creat: 0.74 mg/dL (ref 0.60–0.93)
Glucose, Bld: 92 mg/dL (ref 65–99)
Potassium: 5.3 mmol/L (ref 3.5–5.3)
Sodium: 136 mmol/L (ref 135–146)

## 2020-07-25 ENCOUNTER — Other Ambulatory Visit: Payer: Self-pay | Admitting: Family Medicine

## 2020-07-25 DIAGNOSIS — I10 Essential (primary) hypertension: Secondary | ICD-10-CM

## 2020-07-26 ENCOUNTER — Encounter: Payer: Self-pay | Admitting: Family Medicine

## 2020-08-01 ENCOUNTER — Other Ambulatory Visit: Payer: Self-pay

## 2020-08-01 ENCOUNTER — Encounter: Payer: Self-pay | Admitting: Family Medicine

## 2020-08-01 ENCOUNTER — Ambulatory Visit (INDEPENDENT_AMBULATORY_CARE_PROVIDER_SITE_OTHER): Payer: Medicare Other | Admitting: Family Medicine

## 2020-08-01 VITALS — BP 126/68 | HR 58 | Temp 98.6°F | Resp 18 | Ht 61.0 in | Wt 156.0 lb

## 2020-08-01 DIAGNOSIS — E875 Hyperkalemia: Secondary | ICD-10-CM | POA: Diagnosis not present

## 2020-08-01 DIAGNOSIS — I10 Essential (primary) hypertension: Secondary | ICD-10-CM

## 2020-08-01 DIAGNOSIS — R21 Rash and other nonspecific skin eruption: Secondary | ICD-10-CM

## 2020-08-01 DIAGNOSIS — R002 Palpitations: Secondary | ICD-10-CM | POA: Diagnosis not present

## 2020-08-01 DIAGNOSIS — E785 Hyperlipidemia, unspecified: Secondary | ICD-10-CM | POA: Diagnosis not present

## 2020-08-01 MED ORDER — TRIAMCINOLONE ACETONIDE 0.1 % EX CREA: 1.0000 "application " | TOPICAL_CREAM | Freq: Two times a day (BID) | CUTANEOUS | 3 refills | Status: DC

## 2020-08-01 MED ORDER — AMLODIPINE BESYLATE 2.5 MG PO TABS: 2.5000 mg | ORAL_TABLET | Freq: Every day | ORAL | 1 refills | Status: DC

## 2020-08-01 NOTE — Assessment & Plan Note (Signed)
ekg sinus brady If any inc chest pain or palpatations go to ER

## 2020-08-01 NOTE — Assessment & Plan Note (Signed)
Encouraged heart healthy diet, increase exercise, avoid trans fats, consider a krill oil cap daily Lab Results  Component Value Date   CHOL 210 (H) 06/15/2020   HDL 65 06/15/2020   LDLCALC 125 (H) 06/15/2020   LDLDIRECT 153.3 12/18/2012   TRIG 94 06/15/2020   CHOLHDL 3.2 06/15/2020

## 2020-08-01 NOTE — Progress Notes (Signed)
Patient ID: Kayla [name], female    DOB: 1946-03-21  Age: 74 y.o. MRN: [id number]    Subjective:  Subjective  HPI TYRIHANNA WINGERT presents for f/u bp.  She is also c/o chest pain -- no sob or palpitations   Review of Systems  Constitutional: Negative for appetite change, diaphoresis, fatigue and unexpected weight change.  Eyes: Negative for pain, redness and visual disturbance.  Respiratory: Negative for cough, chest tightness, shortness of breath and wheezing.   Cardiovascular: Positive for chest pain. Negative for palpitations and leg swelling.  Endocrine: Negative for cold intolerance, heat intolerance, polydipsia, polyphagia and polyuria.  Genitourinary: Negative for difficulty urinating, dysuria and frequency.  Neurological: Negative for dizziness, light-headedness, numbness and headaches.    History Past Medical History:  Diagnosis Date   Abscess of groin, left 11/10/2014   Abscess of left groin 11/10/2014   Asthma    CAP (community acquired pneumonia) 08/2012   Dr Birdie Riddle   Cellulitis 10/20/2014   Diverticulitis    Diverticulosis    Fasting hyperglycemia 04/2011   FBS 108   Fibromyalgia    Fundic gland polyps of stomach, benign    GERD (gastroesophageal reflux disease)    gastric polyp x3   Glaucoma     Dr Bing Plume   IBS (irritable bowel syndrome)    Osteopenia    last 07/2011   Pneumonia     OP as child   Shingles     She has a past surgical history that includes Spine surgery; Colonoscopy w/ polypectomy (2585,[id number]); and Esophagogastroduodenoscopy.   Her family history includes Asthma in her paternal aunt; Breast cancer in her maternal aunt, mother, paternal aunt, sister, and another family member; Coronary artery disease in her paternal aunt; Deep vein thrombosis in her son; Heart Problems in her son; Heart attack in her paternal grandmother and paternal uncle; Heart disease in her maternal aunt and paternal grandfather; Heart failure in her  father; Kidney disease in her maternal grandmother; Ovarian cancer in an other family member; Pulmonary embolism in her son.She reports that she quit smoking about 45 years ago. Her smoking use included cigarettes. She has a 5.00 pack-year smoking history. She has never used smokeless tobacco. She reports that she does not drink alcohol and does not use drugs.  Current Outpatient Medications on File Prior to Visit  Medication Sig Dispense Refill   acetaminophen (TYLENOL) 500 MG tablet Take 500 mg by mouth as needed.     albuterol (PROVENTIL) (2.5 MG/3ML) 0.083% nebulizer solution Take 3 mLs (2.5 mg total) by nebulization every 6 (six) hours as needed for wheezing or shortness of breath. 75 mL 12   albuterol (VENTOLIN HFA) 108 (90 Base) MCG/ACT inhaler Inhale 2 puffs into the lungs every 6 (six) hours as needed for wheezing or shortness of breath. As needed only 18 g 3   bifidobacterium infantis (ALIGN) capsule Take 1 capsule by mouth daily. 21 capsule 0   carboxymethylcellulose (REFRESH PLUS) 0.5 % SOLN 1 drop 3 (three) times daily as needed.     cholecalciferol (VITAMIN D) 1000 units tablet Take 1 capsule by mouth daily.     colchicine 0.6 MG tablet Take 1 tablet (0.6 mg total) by mouth 2 (two) times daily. 1 po bid x 2-3 days then take 1 a day 30 tablet 0   EPINEPHrine (EPIPEN 2-PAK) 0.3 mg/0.3 mL IJ SOAJ injection As directed 1 each 0   famotidine (PEPCID) 10 MG tablet Take 10 mg by mouth at bedtime. Alternating  with 20mg  tablets she had     fluticasone (FLONASE) 50 MCG/ACT nasal spray USE 2 SPRAYS IN EACH  NOSTRIL ONCE A DAY 48 g 3   GuaiFENesin (MUCINEX PO) Take by mouth as needed. Reported on 03/15/2016     hyoscyamine (LEVSIN SL) 0.125 MG SL tablet Place 1 tablet (0.125 mg total) under the tongue every 6 (six) hours as needed. 10 tablet 1   latanoprost (XALATAN) 0.005 % ophthalmic solution Place 1 drop into both eyes at bedtime.     methylPREDNISolone (MEDROL DOSEPAK) 4 MG TBPK  tablet Take as directed on box 21 each 0   metoprolol tartrate (LOPRESSOR) 25 MG tablet Take 1 tablet (25 mg total) by mouth 2 (two) times daily. 180 tablet 1   montelukast (SINGULAIR) 10 MG tablet TAKE 1 TABLET(10 MG) BY MOUTH DAILY. 90 tablet 2   Multiple Minerals-Vitamins (CALCIUM CITRATE PLUS/MAGNESIUM PO) Take by mouth.     nystatin cream (MYCOSTATIN) Apply 1 application topically 2 (two) times daily. 90 g 3   Omega-3 Fatty Acids (FISH OIL PO) Take by mouth.     Polyethyl Glycol-Propyl Glycol (SYSTANE) 0.4-0.3 % GEL ophthalmic gel Place 1 application into both eyes.     sodium chloride (OCEAN) 0.65 % SOLN nasal spray Place 1 spray into both nostrils as needed for congestion.     Vitamin Mixture (ESTER-C PO) Take 1,000 mg by mouth daily.     No current facility-administered medications on file prior to visit.     Objective:  Objective  Physical Exam Vitals and nursing note reviewed.  Constitutional:      Appearance: She is well-developed.  HENT:     Head: Normocephalic and atraumatic.  Eyes:     Conjunctiva/sclera: Conjunctivae normal.  Neck:     Thyroid: No thyromegaly.     Vascular: No carotid bruit or JVD.  Cardiovascular:     Rate and Rhythm: Normal rate and regular rhythm.     Heart sounds: Normal heart sounds. No murmur heard.   Pulmonary:     Effort: Pulmonary effort is normal. No respiratory distress.     Breath sounds: Normal breath sounds. No wheezing or rales.  Chest:     Chest wall: No tenderness.  Musculoskeletal:     Cervical back: Normal range of motion and neck supple.  Neurological:     Mental Status: She is alert and oriented to person, place, and time.   ekg--sinus brady   BP 126/68 (BP Location: Right Arm, Patient Position: Sitting, Cuff Size: Large)    Pulse (!) 58    Temp 98.6 F (37 C) (Oral)    Resp 18    Ht 5\' 1"  (1.549 m)    Wt 156 lb (70.8 kg)    SpO2 100%    BMI 29.48 kg/m  Wt Readings from Last 3 Encounters:  08/01/20 156 lb (70.8  kg)  03/29/20 150 lb 3.2 oz (68.1 kg)  03/08/20 155 lb 3.2 oz (70.4 kg)     Lab Results  Component Value Date   WBC 5.0 11/26/2018   HGB 14.1 11/26/2018   HCT 41.6 11/26/2018   PLT 170.0 11/26/2018   GLUCOSE 92 06/20/2020   CHOL 210 (H) 06/15/2020   TRIG 94 06/15/2020   HDL 65 06/15/2020   LDLDIRECT 153.3 12/18/2012   LDLCALC 125 (H) 06/15/2020   ALT 14 06/15/2020   AST 20 06/15/2020   NA 136 06/20/2020   K 5.3 06/20/2020   CL 102 06/20/2020   CREATININE  0.74 06/20/2020   BUN 17 06/20/2020   CO2 31 06/20/2020   TSH 3.24 01/02/2018   INR 0.97 12/15/2009   HGBA1C 5.4 11/01/2011    DG Chest Portable 1 View  Result Date: 03/05/2020 CLINICAL DATA:  Persistent cough, loose stools, former smoker and asthmatic EXAM: PORTABLE CHEST 1 VIEW COMPARISON:  Radiograph 09/01/2018 FINDINGS: No consolidation, features of edema, pneumothorax, or effusion. The cardiomediastinal contours are unremarkable. Normal pulmonary vascularity. Long segment thoracolumbar fusion is again noted with discontinuity of the left fusion rod similar to comparison radiograph.No acute osseous or soft tissue abnormality. IMPRESSION: 1. No acute cardiopulmonary disease. 2. Long segment thoracolumbar fusion. Discontinuity of the left fusion rod similar to comparison radiograph. Electronically Signed   By: Lovena Le M.D.   On: 03/05/2020 22:37     Assessment & Plan:  Plan  I have changed Lillan M. Lumpkin's triamcinolone. I am also having her maintain her GuaiFENesin (MUCINEX PO), acetaminophen, bifidobacterium infantis, Omega-3 Fatty Acids (FISH OIL PO), Multiple Minerals-Vitamins (CALCIUM CITRATE PLUS/MAGNESIUM PO), sodium chloride, carboxymethylcellulose, colchicine, Vitamin Mixture (ESTER-C PO), cholecalciferol, Polyethyl Glycol-Propyl Glycol, latanoprost, famotidine, fluticasone, EPINEPHrine, albuterol, hyoscyamine, nystatin cream, montelukast, methylPREDNISolone, albuterol, metoprolol tartrate, and  amLODipine.  Meds ordered this encounter  Medications   amLODipine (NORVASC) 2.5 MG tablet    Sig: Take 1 tablet (2.5 mg total) by mouth daily.    Dispense:  90 tablet    Refill:  1   triamcinolone (KENALOG) 0.1 %    Sig: Apply 1 application topically 2 (two) times daily.    Dispense:  90 g    Refill:  3    Problem List Items Addressed This Visit      Unprioritized   Essential hypertension   Relevant Medications   amLODipine (NORVASC) 2.5 MG tablet   Other Relevant Orders   Basic metabolic panel   ECHOCARDIOGRAM COMPLETE   EKG 12-Lead (Completed)   Palpitations   Relevant Orders   ECHOCARDIOGRAM COMPLETE   EKG 12-Lead (Completed)    Other Visit Diagnoses    Hyperkalemia    -  Primary   Relevant Orders   Basic metabolic panel   Rash and nonspecific skin eruption       Relevant Medications   triamcinolone (KENALOG) 0.1 %      Follow-up: Return in about 6 months (around 01/29/2021), or if symptoms worsen or fail to improve, for annual exam, fasting--- or sooner prn .  [name], DO

## 2020-08-01 NOTE — Patient Instructions (Signed)

## 2020-08-01 NOTE — Assessment & Plan Note (Signed)
Well controlled, no changes to meds. Encouraged heart healthy diet such as the DASH diet and exercise as tolerated.  °

## 2020-08-02 ENCOUNTER — Emergency Department (HOSPITAL_BASED_OUTPATIENT_CLINIC_OR_DEPARTMENT_OTHER): Payer: Medicare Other

## 2020-08-02 ENCOUNTER — Other Ambulatory Visit: Payer: Self-pay

## 2020-08-02 ENCOUNTER — Encounter (HOSPITAL_BASED_OUTPATIENT_CLINIC_OR_DEPARTMENT_OTHER): Payer: Self-pay | Admitting: *Deleted

## 2020-08-02 ENCOUNTER — Encounter: Payer: Self-pay | Admitting: Family Medicine

## 2020-08-02 ENCOUNTER — Emergency Department (HOSPITAL_BASED_OUTPATIENT_CLINIC_OR_DEPARTMENT_OTHER)
Admission: EM | Admit: 2020-08-02 | Discharge: 2020-08-02 | Disposition: A | Discharge status: Home / Self Care | Payer: Medicare Other | Attending: Emergency Medicine | Admitting: Emergency Medicine

## 2020-08-02 DIAGNOSIS — Z79899 Other long term (current) drug therapy: Secondary | ICD-10-CM | POA: Diagnosis not present

## 2020-08-02 DIAGNOSIS — R0789 Other chest pain: Secondary | ICD-10-CM | POA: Insufficient documentation

## 2020-08-02 DIAGNOSIS — J45909 Unspecified asthma, uncomplicated: Secondary | ICD-10-CM | POA: Diagnosis not present

## 2020-08-02 DIAGNOSIS — Z86010 Personal history of colonic polyps: Secondary | ICD-10-CM | POA: Diagnosis not present

## 2020-08-02 DIAGNOSIS — R002 Palpitations: Secondary | ICD-10-CM | POA: Diagnosis not present

## 2020-08-02 DIAGNOSIS — R0609 Other forms of dyspnea: Secondary | ICD-10-CM | POA: Insufficient documentation

## 2020-08-02 DIAGNOSIS — R0602 Shortness of breath: Secondary | ICD-10-CM | POA: Diagnosis not present

## 2020-08-02 DIAGNOSIS — Z87891 Personal history of nicotine dependence: Secondary | ICD-10-CM | POA: Diagnosis not present

## 2020-08-02 DIAGNOSIS — I10 Essential (primary) hypertension: Secondary | ICD-10-CM | POA: Insufficient documentation

## 2020-08-02 LAB — CBC WITH DIFFERENTIAL/PLATELET
Abs Immature Granulocytes: 0.01 10*3/uL (ref 0.00–0.07)
Basophils Absolute: 0 10*3/uL (ref 0.0–0.1)
Basophils Relative: 1 %
Eosinophils Absolute: 0.2 10*3/uL (ref 0.0–0.5)
Eosinophils Relative: 4 %
HCT: 38.9 % (ref 36.0–46.0)
Hemoglobin: 13 g/dL (ref 12.0–15.0)
Immature Granulocytes: 0 %
Lymphocytes Relative: 15 %
Lymphs Abs: 0.6 10*3/uL — ABNORMAL LOW (ref 0.7–4.0)
MCH: 31.1 pg (ref 26.0–34.0)
MCHC: 33.4 g/dL (ref 30.0–36.0)
MCV: 93.1 fL (ref 80.0–100.0)
Monocytes Absolute: 0.5 10*3/uL (ref 0.1–1.0)
Monocytes Relative: 12 %
Neutro Abs: 2.9 10*3/uL (ref 1.7–7.7)
Neutrophils Relative %: 68 %
Platelets: 163 10*3/uL (ref 150–400)
RBC: 4.18 MIL/uL (ref 3.87–5.11)
RDW: 13.2 % (ref 11.5–15.5)
WBC: 4.2 10*3/uL (ref 4.0–10.5)
nRBC: 0 % (ref 0.0–0.2)

## 2020-08-02 LAB — BASIC METABOLIC PANEL
BUN: 15 mg/dL (ref 6–23)
CO2: 29 mEq/L (ref 19–32)
Calcium: 10 mg/dL (ref 8.4–10.5)
Chloride: 101 mEq/L (ref 96–112)
Creatinine, Ser: 0.72 mg/dL (ref 0.40–1.20)
GFR: 82.09 mL/min (ref >60.00)
Glucose, Bld: 92 mg/dL (ref 70–99)
Potassium: 5.5 mEq/L — ABNORMAL HIGH (ref 3.5–5.1)
Sodium: 137 mEq/L (ref 135–145)

## 2020-08-02 LAB — TROPONIN I (HIGH SENSITIVITY)
Troponin I (High Sensitivity): 4 ng/L (ref <18)
Troponin I (High Sensitivity): 5 ng/L (ref <18)

## 2020-08-02 LAB — COMPREHENSIVE METABOLIC PANEL
ALT: 18 U/L (ref 0–44)
AST: 26 U/L (ref 15–41)
Albumin: 4.2 g/dL (ref 3.5–5.0)
Alkaline Phosphatase: 70 U/L (ref 38–126)
Anion gap: 8 (ref 5–15)
BUN: 13 mg/dL (ref 8–23)
CO2: 25 mmol/L (ref 22–32)
Calcium: 9.3 mg/dL (ref 8.9–10.3)
Chloride: 102 mmol/L (ref 98–111)
Creatinine, Ser: 0.65 mg/dL (ref 0.44–1.00)
GFR, Estimated: >60 mL/min (ref >60)
Glucose, Bld: 99 mg/dL (ref 70–99)
Potassium: 3.8 mmol/L (ref 3.5–5.1)
Sodium: 135 mmol/L (ref 135–145)
Total Bilirubin: 0.8 mg/dL (ref 0.3–1.2)
Total Protein: 6.8 g/dL (ref 6.5–8.1)

## 2020-08-02 LAB — D-DIMER, QUANTITATIVE: D-Dimer, Quant: 1.36 ug/mL-FEU — ABNORMAL HIGH (ref 0.00–0.50)

## 2020-08-02 MED ORDER — IOHEXOL 350 MG/ML SOLN
100.0000 mL | Freq: Once | INTRAVENOUS | Status: AC | PRN
  Administered 2020-08-02: 100 mL via INTRAVENOUS

## 2020-08-02 NOTE — ED Triage Notes (Signed)
Pt c/o palpitations and SOB x week  x 2 hrs

## 2020-08-02 NOTE — ED Provider Notes (Signed)
[facility] EMERGENCY DEPARTMENT Provider Note   CSN: [id number] Arrival date & time: 08/02/20  1815     History Chief Complaint  Patient presents with  . Palpitations    Kayla [name] is a 74 y.o. female.  The history is provided by the patient and medical records.  Palpitations  Kayla [name] is a 74 y.o. female who presents to the Emergency Department complaining of palpitations. She presents the emergency department complaining of palpitations that have been intermittent for the last few weeks. Episodes are described as a pounding feeling in her chest. Her heart does not feel fast or slow or irregular but pounds very hard. At times she feels pounding in her neck with these episodes. Occasionally there is accompanying chest discomfort. This is not consistent. She does report increased exertional dyspnea. No reports of fever, cough, abdominal pain, nausea, vomiting, diarrhea. She does have mild bilateral lower extremity edema, slightly increase her baseline.  She has a history of hypertension. She has experienced hyperkalemia on outpatient labs in the past but has no history of renal disease. She has a family history of renal disease.    Past Medical History:  Diagnosis Date  . Abscess of groin, left 11/10/2014  . Abscess of left groin 11/10/2014  . Asthma   . CAP (community acquired pneumonia) 08/2012   Dr Birdie Riddle  . Cellulitis 10/20/2014  . Diverticulitis   . Diverticulosis   . Fasting hyperglycemia 04/2011   FBS 108  . Fibromyalgia   . Fundic gland polyps of stomach, benign   . GERD (gastroesophageal reflux disease)    gastric polyp x3  . Glaucoma     Dr Bing Plume  . IBS (irritable bowel syndrome)   . Osteopenia    last 07/2011  . Pneumonia     OP as child  . Shingles     Patient Active Problem List   Diagnosis Date Noted  . Asthmatic bronchitis 03/09/2020  . Allergies 01/18/2020  . Preventative health care 03/29/2019  . Pneumonia of left lower lobe  due to infectious organism 09/01/2018  . Vaginal atrophy 03/24/2018  . Seasonal allergies 02/08/2018  . Erythema nodosum 06/09/2016  . Herpes zoster 09/23/2013  . Superficial bruising of finger 02/16/2013  . Hyperlipidemia 11/01/2011  . Palpitations 05/23/2011  . Essential hypertension 04/23/2011  . Cough 04/09/2011  . THYROID FUNCTION TEST, ABNORMAL 12/20/2009  . COLONIC POLYPS, HX OF 11/08/2008  . Asthma 01/21/2008  . GERD 01/21/2008  . IRRITABLE BOWEL SYNDROME 01/21/2008  . FIBROMYALGIA 01/21/2008  . Vitamin D deficiency 04/02/2007  . DEGENERATIVE DISC DISEASE 04/02/2007  . GLAUCOMA, LOW TENSION 02/06/2007  . Osteoporosis 02/06/2007    Past Surgical History:  Procedure Laterality Date  . COLONOSCOPY W/ POLYPECTOMY  [phone]   last  colonoscopy 2007, Dr Carlean Purl  . ESOPHAGOGASTRODUODENOSCOPY    . SPINE SURGERY     T9-L5 fusions     OB History   No obstetric history on file.     Family History  Problem Relation Age of Onset  . Breast cancer Mother   . Breast cancer Sister   . Breast cancer Maternal Aunt        two  . Breast cancer Paternal Aunt   . Asthma Paternal Aunt   . Heart failure Father        CHF  . Kidney disease Maternal Grandmother   . Ovarian cancer Other        Niece x2  . Breast cancer Other  niece  . Coronary artery disease Paternal Aunt        triple CABG  . Heart attack Paternal Grandmother        MI in late 28s  . Heart disease Paternal Grandfather   . Heart attack Paternal Uncle         MI in 35s  . Heart disease Maternal Aunt   . Pulmonary embolism Son   . Deep vein thrombosis Son   . Heart Problems Son   . Diabetes Neg Hx   . Stroke Neg Hx   . COPD Neg Hx   . Colon polyps Neg Hx     Social History   Tobacco Use  . Smoking status: Former Smoker    Packs/day: 0.50    Years: 10.00    Pack years: 5.00    Types: Cigarettes    Quit date: 09/10/1974    Years since quitting: 45.9  . Smokeless tobacco: Never Used  .  Tobacco comment: smoked 1966- 1976, up to 1 ppd  Substance Use Topics  . Alcohol use: No    Alcohol/week: 0.0 standard drinks  . Drug use: No    Home Medications Prior to Admission medications   Medication Sig Start Date End Date Taking? Authorizing Provider  acetaminophen (TYLENOL) 500 MG tablet Take 500 mg by mouth as needed.    [provider]  albuterol (PROVENTIL) (2.5 MG/3ML) 0.083% nebulizer solution Take 3 mLs (2.5 mg total) by nebulization every 6 (six) hours as needed for wheezing or shortness of breath. 03/08/20   Carollee Herter, Alferd Apa, DO  albuterol (VENTOLIN HFA) 108 (90 Base) MCG/ACT inhaler Inhale 2 puffs into the lungs every 6 (six) hours as needed for wheezing or shortness of breath. As needed only 03/27/19   Carollee Herter, Alferd Apa, DO  amLODipine (NORVASC) 2.5 MG tablet Take 1 tablet (2.5 mg total) by mouth daily. 08/01/20   Roma Schanz R, DO  bifidobacterium infantis (ALIGN) capsule Take 1 capsule by mouth daily. 12/29/12   Gatha Mayer, MD  carboxymethylcellulose (REFRESH PLUS) 0.5 % SOLN 1 drop 3 (three) times daily as needed.    [provider]  cholecalciferol (VITAMIN D) 1000 units tablet Take 1 capsule by mouth daily. 12/24/16   [provider]  colchicine 0.6 MG tablet Take 1 tablet (0.6 mg total) by mouth 2 (two) times daily. 1 po bid x 2-3 days then take 1 a day 05/24/16   Carollee Herter, Alferd Apa, DO  EPINEPHrine (EPIPEN 2-PAK) 0.3 mg/0.3 mL IJ SOAJ injection As directed 03/27/19   Carollee Herter, Alferd Apa, DO  famotidine (PEPCID) 10 MG tablet Take 10 mg by mouth at bedtime. Alternating with 20mg  tablets she had    [provider]  fluticasone (FLONASE) 50 MCG/ACT nasal spray USE 2 SPRAYS IN EACH  NOSTRIL ONCE A DAY 03/27/19   Carollee Herter, Alferd Apa, DO  GuaiFENesin (MUCINEX PO) Take by mouth as needed. Reported on 03/15/2016    [provider]  hyoscyamine (LEVSIN SL) 0.125 MG SL tablet Place 1 tablet (0.125 mg total) under  the tongue every 6 (six) hours as needed. 06/02/19   Gatha Mayer, MD  latanoprost (XALATAN) 0.005 % ophthalmic solution Place 1 drop into both eyes at bedtime.    [provider]  methylPREDNISolone (MEDROL DOSEPAK) 4 MG TBPK tablet Take as directed on box 03/05/20   Little, Wenda Overland, MD  metoprolol tartrate (LOPRESSOR) 25 MG tablet Take 1 tablet (25 mg total)  by mouth 2 (two) times daily. 07/26/20   Roma Schanz R, DO  montelukast (SINGULAIR) 10 MG tablet TAKE 1 TABLET(10 MG) BY MOUTH DAILY. 01/18/20   [name], DO  Multiple Minerals-Vitamins (CALCIUM CITRATE PLUS/MAGNESIUM PO) Take by mouth.    [provider]  nystatin cream (MYCOSTATIN) Apply 1 application topically 2 (two) times daily. 01/18/20   [name], DO  Omega-3 Fatty Acids (FISH OIL PO) Take by mouth.    [provider]  Polyethyl Glycol-Propyl Glycol (SYSTANE) 0.4-0.3 % GEL ophthalmic gel Place 1 application into both eyes.    [provider]  sodium chloride (OCEAN) 0.65 % SOLN nasal spray Place 1 spray into both nostrils as needed for congestion.    [provider]  triamcinolone (KENALOG) 0.1 % Apply 1 application topically 2 (two) times daily. 08/01/20   [name], DO  Vitamin Mixture (ESTER-C PO) Take 1,000 mg by mouth daily.    [provider]    Allergies    Cephalosporins, Gabapentin, Levofloxacin, Psyllium, Sulfonamide derivatives, Aspirin, Belladonna, Ciprofloxacin hcl, Conjugated estrogens, Doxycycline, Flovent hfa [fluticasone], Nabumetone, Nsaids, Soybean-containing drug products, Zicam cold remedy [homeopathic products], Zocor [simvastatin], and Prednisone  Review of Systems   Review of Systems  Cardiovascular: Positive for palpitations.  All other systems reviewed and are negative.   Physical Exam Updated Vital Signs BP 137/61 (BP Location: Right Arm)   Pulse (!) 52   Temp 98.2 F (36.8 C) (Oral)   Resp 10    Ht 5' 1.5" (1.562 m)   Wt 68 kg   SpO2 97%   BMI 27.88 kg/m   Physical Exam Vitals and nursing note reviewed.  Constitutional:      Appearance: She is well-developed.  HENT:     Head: Normocephalic and atraumatic.  Cardiovascular:     Rate and Rhythm: Regular rhythm. Bradycardia present.     Heart sounds: No murmur heard.   Pulmonary:     Effort: Pulmonary effort is normal. No respiratory distress.     Breath sounds: Normal breath sounds.  Abdominal:     Palpations: Abdomen is soft.     Tenderness: There is no abdominal tenderness. There is no guarding or rebound.  Musculoskeletal:        General: No tenderness.  Skin:    General: Skin is warm and dry.  Neurological:     Mental Status: She is alert and oriented to person, place, and time.  Psychiatric:        Behavior: Behavior normal.     ED Results / Procedures / Treatments   Labs (all labs ordered are listed, but only abnormal results are displayed) Labs Reviewed  CBC WITH DIFFERENTIAL/PLATELET - Abnormal; Notable for the following components:      Result Value   Lymphs Abs 0.6 (*)    All other components within normal limits  D-DIMER, QUANTITATIVE (NOT AT [facility]) - Abnormal; Notable for the following components:   D-Dimer, Quant 1.36 (*)    All other components within normal limits  COMPREHENSIVE METABOLIC PANEL  TROPONIN I (HIGH SENSITIVITY)  TROPONIN I (HIGH SENSITIVITY)    EKG EKG Interpretation  Date/Time:  Tuesday August 02 2020 18:22:04 EST Ventricular Rate:  58 PR Interval:  182 QRS Duration: 72 QT Interval:  426 QTC Calculation: 418 R Axis:   56 Text Interpretation: Sinus bradycardia Otherwise normal ECG Confirmed by Quintella Reichert [phone]) on 08/02/2020 6:33:46 PM   Radiology DG Chest 2 View  Result Date: 08/02/2020 CLINICAL DATA:  Chest pain, palpitations EXAM: CHEST - 2 VIEW COMPARISON:  03/05/2020 FINDINGS: The heart size and mediastinal contours are within normal limits. Both lungs  are clear. No pleural effusion or pneumothorax. Levoscoliosis of the lower thoracic spine with posterior fusion. IMPRESSION: No acute process in the chest. Electronically Signed   By: Macy Mis M.D.   On: 08/02/2020 20:18   CT Angio Chest PE W/Cm &/Or Wo Cm  Result Date: 08/02/2020 CLINICAL DATA:  Palpitations and shortness of breath. Positive D-dimer. EXAM: CT ANGIOGRAPHY CHEST WITH CONTRAST TECHNIQUE: Multidetector CT imaging of the chest was performed using the standard protocol during bolus administration of intravenous contrast. Multiplanar CT image reconstructions and MIPs were obtained to evaluate the vascular anatomy. CONTRAST:  147mL OMNIPAQUE IOHEXOL 350 MG/ML SOLN COMPARISON:  CT heart 12/16/2009 FINDINGS: Cardiovascular: Satisfactory opacification of the pulmonary arteries to the segmental level. No evidence of pulmonary embolism. The main pulmonary artery is normal in caliber. Normal heart size. No pericardial effusion. The thoracic aorta is normal in caliber. No atherosclerotic plaque. No coronary artery calcifications. Mediastinum/Nodes: No enlarged mediastinal, hilar, or axillary lymph nodes. Thyroid gland, trachea, and esophagus demonstrate no significant findings. Lungs/Pleura: Several scattered pulmonary micronodules. No pulmonary mass. No focal consolidation. No pleural effusion or pneumothorax. Upper Abdomen: No acute abnormality. Musculoskeletal: No abdominal wall hernia or abnormality Diffusely decreased bone density. Partially visualized thoracolumbar posterior fusion in the setting of lower thoracic levoscoliosis. No suspicious lytic or blastic osseous lesions. No acute displaced fracture. Multilevel degenerative changes of the spine. Review of the MIP images confirms the above findings. IMPRESSION: 1. No pulmonary embolus. 2. No acute intrathoracic abnormality. Electronically Signed   By: Iven Finn M.D.   On: 08/02/2020 20:33    Procedures Procedures (including critical  care time)  Medications Ordered in ED Medications  iohexol (OMNIPAQUE) 350 MG/ML injection 100 mL (100 mLs Intravenous Contrast Given 08/02/20 1951)    ED Course  I have reviewed the triage vital signs and the nursing notes.  Pertinent labs & imaging results that were available during my care of the patient were reviewed by me and considered in my medical decision making (see chart for details).    MDM Rules/Calculators/A&P                         patient here for evaluation of palpitations that have been intermittent for a couple of weeks, worsening over the last 24 hours. She has had problems with hyperkalemia on prior outpatient lab draws. She has a history of hypertension. EKG with sinus bradycardia. Troponin's are negative times two. A D dimer was obtained given her shortness of breath, which was mildly elevated. CTA is negative for PE or pneumonia. Presentation is not consistent with ACS. Telemetry reviewed for patient's ED stay, she had one PVC during her stay, no additional arrhythmias. Discussed with patient home care for palpitations. Discussed outpatient follow-up and return precautions.  Final Clinical Impression(s) / ED Diagnoses Final diagnoses:  Palpitations    Rx / DC Orders ED Discharge Orders    None       Quintella Reichert, MD 08/02/20 2213

## 2020-08-03 ENCOUNTER — Other Ambulatory Visit: Payer: Self-pay | Admitting: Family Medicine

## 2020-08-03 ENCOUNTER — Encounter: Payer: Self-pay | Admitting: Family Medicine

## 2020-08-03 DIAGNOSIS — R002 Palpitations: Secondary | ICD-10-CM

## 2020-08-03 NOTE — Telephone Encounter (Signed)
Placed referral for cardiology

## 2020-08-12 DIAGNOSIS — H401131 Primary open-angle glaucoma, bilateral, mild stage: Secondary | ICD-10-CM | POA: Diagnosis not present

## 2020-08-12 DIAGNOSIS — H2513 Age-related nuclear cataract, bilateral: Secondary | ICD-10-CM | POA: Diagnosis not present

## 2020-08-12 DIAGNOSIS — H04123 Dry eye syndrome of bilateral lacrimal glands: Secondary | ICD-10-CM | POA: Diagnosis not present

## 2020-08-24 NOTE — Progress Notes (Signed)
Cardiology Office Note   Date:  08/25/2020   ID:  Rasha, Ibe 07-18-1946, MRN [id number]  PCP:  Carollee Herter, Alferd Apa, DO  Cardiologist:   No primary care provider on file.   Chief Complaint  Patient presents with  . Chest Pain      History of Present Illness: Kayla [name] is a 74 y.o. female who is referred by Carollee Herter, Alferd Apa, DO for evaluation of palpitations.  I saw her in the past for this.   I saw her in 2014 for palpitations.  She had a 21 day event monitor.  She had normal coronaries on CT in the past.    She presented to the emergency room on the 23rd.  The 22nd she developed some discomfort.  He was somewhat difficult to quantify or qualify.  She has been having a feeling in her upper chest.  She feels like her heart is going up to her throat.  She describes it as uncomfortable.  Not pressure or pain.  She not had this previously.  This feels a little bit like it is beating hard.  She does get a sensation like she cannot take a deep breath.  She has to push garbage cans up an incline.  She does get short of breath and more fatigued with this.  She has to think about taking deep breaths but she is not really describing PND or orthopnea.  She is not really describing exertional dyspnea because she is able to do chores.  She is more fatigued.  She feels heavy legs.  In the emergency room she was not noted to have any elevated enzymes.  He had an elevated D-dimer.  CAT scan did not suggest pulmonary embolism.  There was no coronary calcium.  Past Medical History:  Diagnosis Date  . Abscess of left groin 11/10/2014  . Asthma   . Cellulitis 10/20/2014  . Diverticulitis   . Diverticulosis   . Fasting hyperglycemia 04/2011   FBS 108  . Fibromyalgia   . Fundic gland polyps of stomach, benign   . GERD (gastroesophageal reflux disease)    gastric polyp x3  . Glaucoma     Dr Bing Plume  . IBS (irritable bowel syndrome)   . Osteopenia    last 07/2011  .  Pneumonia     OP as child  . Shingles     Past Surgical History:  Procedure Laterality Date  . COLONOSCOPY W/ POLYPECTOMY  [phone]   last  colonoscopy 2007, Dr Carlean Purl  . ESOPHAGOGASTRODUODENOSCOPY    . SPINE SURGERY     T9-L5 fusions     Current Outpatient Medications  Medication Sig Dispense Refill  . acetaminophen (TYLENOL) 500 MG tablet Take 500 mg by mouth as needed.    Marland Kitchen albuterol (PROVENTIL) (2.5 MG/3ML) 0.083% nebulizer solution Take 3 mLs (2.5 mg total) by nebulization every 6 (six) hours as needed for wheezing or shortness of breath. 75 mL 12  . albuterol (VENTOLIN HFA) 108 (90 Base) MCG/ACT inhaler Inhale 2 puffs into the lungs every 6 (six) hours as needed for wheezing or shortness of breath. As needed only 18 g 3  . amLODipine (NORVASC) 2.5 MG tablet Take 1 tablet (2.5 mg total) by mouth daily. 90 tablet 1  . bifidobacterium infantis (ALIGN) capsule Take 1 capsule by mouth daily. 21 capsule 0  . carboxymethylcellulose (REFRESH PLUS) 0.5 % SOLN 1 drop 3 (three) times daily as needed.    . cholecalciferol (  VITAMIN D) 1000 units tablet Take 2,000 Units by mouth daily.    Marland Kitchen EPINEPHrine (EPIPEN 2-PAK) 0.3 mg/0.3 mL IJ SOAJ injection As directed 1 each 0  . famotidine (PEPCID) 10 MG tablet Take 10 mg by mouth at bedtime. Alternating with 20mg  tablets she had    . fluticasone (FLONASE) 50 MCG/ACT nasal spray USE 2 SPRAYS IN EACH  NOSTRIL ONCE A DAY 48 g 3  . hyoscyamine (LEVSIN SL) 0.125 MG SL tablet Place 1 tablet (0.125 mg total) under the tongue every 6 (six) hours as needed. 10 tablet 1  . latanoprost (XALATAN) 0.005 % ophthalmic solution Place 1 drop into both eyes at bedtime.    . metoprolol tartrate (LOPRESSOR) 25 MG tablet Take 1 tablet (25 mg total) by mouth 2 (two) times daily. 180 tablet 1  . montelukast (SINGULAIR) 10 MG tablet TAKE 1 TABLET(10 MG) BY MOUTH DAILY. 90 tablet 2  . nystatin cream (MYCOSTATIN) Apply 1 application topically 2 (two) times daily. 90 g  3  . Omega-3 Fatty Acids (FISH OIL PO) Take by mouth.    Vladimir Faster Glycol-Propyl Glycol (SYSTANE) 0.4-0.3 % GEL ophthalmic gel Place 1 application into both eyes.    . sodium chloride (OCEAN) 0.65 % SOLN nasal spray Place 1 spray into both nostrils as needed for congestion.    . triamcinolone (KENALOG) 0.1 % Apply 1 application topically 2 (two) times daily. 90 g 3  . Vitamin Mixture (ESTER-C PO) Take 1,000 mg by mouth daily.     No current facility-administered medications for this visit.    Allergies:   Cephalosporins, Gabapentin, Levofloxacin, Psyllium, Sulfonamide derivatives, Aspirin, Belladonna, Ciprofloxacin hcl, Conjugated estrogens, Doxycycline, Flovent hfa [fluticasone], Nabumetone, Nsaids, Soybean-containing drug products, Zicam cold remedy [homeopathic products], Zocor [simvastatin], and Prednisone    Social History:  The patient  reports that she quit smoking about 45 years ago. Her smoking use included cigarettes. She has a 5.00 pack-year smoking history. She has never used smokeless tobacco. She reports that she does not drink alcohol and does not use drugs.   Family History:  The patient's family history includes Asthma in her paternal aunt; Breast cancer in her maternal aunt, mother, paternal aunt, sister, and another family member; Coronary artery disease in her paternal aunt; Deep vein thrombosis in her son; Heart Problems in her son; Heart attack in her paternal grandmother and paternal uncle; Heart disease in her maternal aunt and paternal grandfather; Heart failure in her father; Kidney disease in her maternal grandmother; Ovarian cancer in an other family member; Pulmonary embolism in her son.    ROS:  Please see the history of present illness.   Otherwise, review of systems are positive for none.   All other systems are reviewed and negative.    PHYSICAL EXAM: VS:  BP (!) 145/71   Pulse (!) 53   Temp (!) 97.5 F (36.4 C)   Ht 5\' 1"  (1.549 m)   Wt 154 lb 6.4 oz (70  kg)   SpO2 92%   BMI 29.17 kg/m  , BMI Body mass index is 29.17 kg/m. GENERAL:  Well appearing HEENT:  Pupils equal round and reactive, fundi not visualized, oral mucosa unremarkable NECK:  No jugular venous distention, waveform within normal limits, carotid upstroke brisk and symmetric, no bruits, no thyromegaly LYMPHATICS:  No cervical, inguinal adenopathy LUNGS:  Clear to auscultation bilaterally BACK:  No CVA tenderness CHEST:  Unremarkable HEART:  PMI not displaced or sustained,S1 and S2 within normal limits, no S3,  no S4, no clicks, no rubs, no murmurs ABD:  Flat, positive bowel sounds normal in frequency in pitch, no bruits, no rebound, no guarding, no midline pulsatile mass, no hepatomegaly, no splenomegaly EXT:  2 plus pulses throughout, no edema, no cyanosis no clubbing SKIN:  No rashes no nodules NEURO:  Cranial nerves II through XII grossly intact, motor grossly intact throughout PSYCH:  Cognitively intact, oriented to person place and time    EKG:  EKG is ordered today. The ekg ordered today demonstrates sinus bradycardia, rate 53, axis within normal limits, intervals within normal limits, no acute ST-T wave changes.   Recent Labs: 08/02/2020: ALT 18; BUN 13; Creatinine, Ser 0.65; Hemoglobin 13.0; Platelets 163; Potassium 3.8; Sodium 135    Lipid Panel    Component Value Date/Time   CHOL 210 (H) 06/15/2020 1026   TRIG 94 06/15/2020 1026   HDL 65 06/15/2020 1026   CHOLHDL 3.2 06/15/2020 1026   VLDL 30.4 01/18/2020 1348   LDLCALC 125 (H) 06/15/2020 1026   LDLDIRECT 153.3 12/18/2012 1456      Wt Readings from Last 3 Encounters:  08/25/20 154 lb 6.4 oz (70 kg)  08/02/20 150 lb (68 kg)  08/01/20 156 lb (70.8 kg)      Other studies Reviewed: Additional studies/ records that were reviewed today include: ED records. Review of the above records demonstrates:  Please see elsewhere in the note.     ASSESSMENT AND PLAN:  PALPITATIONS:   I did have her wear a  2-week monitor.  I suspect PACs or PVCs.  She will need a TSH if I can find on the records.  Further management will be based on these results.  CHEST PAIN: She had previously normal coronaries although there was some distal difficulty visualization on the CT that we were able to retrieve from 2011.  There was no calcium on the recent CT in the emergency room.  I think her symptoms are not anginal but would like to screen her with a POET (Plain Old Exercise Treadmill) .  This will also allow her to look at her chronotropic response with activity since she has a baseline bradycardia.   Current medicines are reviewed at length with the patient today.  The patient does not have concerns regarding medicines.  The following changes have been made:  no change  Labs/ tests ordered today include:   Orders Placed This Encounter  Procedures  . TSH  . Cardiac Stress Test: Informed Consent Details: Physician/Practitioner Attestation; Transcribe to consent form and obtain patient signature  . EXERCISE TOLERANCE TEST (ETT)  . LONG TERM MONITOR (3-14 DAYS)  . EKG 12-Lead     Disposition:   FU with me as needed   Signed, Minus Breeding, MD  08/25/2020 2:31 PM    [facility]

## 2020-08-25 ENCOUNTER — Encounter: Payer: Self-pay | Admitting: Cardiology

## 2020-08-25 ENCOUNTER — Ambulatory Visit: Payer: Medicare Other | Admitting: Cardiology

## 2020-08-25 ENCOUNTER — Other Ambulatory Visit: Payer: Self-pay

## 2020-08-25 ENCOUNTER — Ambulatory Visit (INDEPENDENT_AMBULATORY_CARE_PROVIDER_SITE_OTHER): Payer: Medicare Other

## 2020-08-25 VITALS — BP 145/71 | HR 53 | Temp 97.5°F | Ht 61.0 in | Wt 154.4 lb

## 2020-08-25 DIAGNOSIS — R079 Chest pain, unspecified: Secondary | ICD-10-CM | POA: Diagnosis not present

## 2020-08-25 DIAGNOSIS — R002 Palpitations: Secondary | ICD-10-CM | POA: Diagnosis not present

## 2020-08-25 NOTE — Patient Instructions (Addendum)
Medication Instructions:  No changes *If you need a refill on your cardiac medications before your next appointment, please call your pharmacy*  Lab Work: Your physician recommends that you return for lab work today (TSH)  Testing/Procedures: Your physician has requested that you have an exercise tolerance test. For further information please visit [email or web address]. Please also follow instruction sheet, as given. You will need a covid screening 3 days before this procedure. You will need to quarantine until the test has been completed.   Your physician has recommended that you wear an event monitor for 14 days. Event monitors are medical devices that record the heart's electrical activity. Doctors most often [place] these monitors to diagnose arrhythmias. Arrhythmias are problems with the speed or rhythm of the heartbeat. The monitor is a small, portable device. You can wear one while you do your normal daily activities. This is usually used to diagnose what is causing palpitations/syncope (passing out).  Follow-Up: At [facility], you and your health needs are our priority.  As part of our continuing mission to provide you with exceptional heart care, we have created designated Provider Care Teams.  These Care Teams include your primary Cardiologist (physician) and Advanced Practice Providers (APPs -  Physician Assistants and Nurse Practitioners) who all work together to provide you with the care you need, when you need it.   Your next appointment:   Follow up as needed  Other Instructions ZIO XT- Long Term Monitor Instructions   Your physician has requested you wear your ZIO patch monitor_14_days.   This is a single patch monitor.  [employer] supplies one patch monitor per enrollment.  Additional stickers are not available.   Please do not apply patch if you will be having a Nuclear Stress Test, Echocardiogram, Cardiac CT, MRI, or Chest Xray during the time frame you would be wearing the  monitor. The patch cannot be worn during these tests.  You cannot remove and re-apply the ZIO XT patch monitor.   Your ZIO patch monitor will be sent USPS Priority mail from [facility] directly to your home address. The monitor may also be mailed to a PO BOX if home delivery is not available.   It may take 3-5 days to receive your monitor after you have been enrolled.   Once you have received you monitor, please review enclosed instructions.  Your monitor has already been registered assigning a specific monitor serial # to you.   Applying the monitor   Shave hair from upper left chest.   Hold abrader disc by orange tab.  Rub abrader in 40 strokes over left upper chest as indicated in your monitor instructions.   Clean area with 4 enclosed alcohol pads .  Use all pads to assure are is cleaned thoroughly.  Let dry.   Apply patch as indicated in monitor instructions.  Patch will be place under collarbone on left side of chest with arrow pointing upward.   Rub patch adhesive wings for 2 minutes.Remove white label marked "1".  Remove white label marked "2".  Rub patch adhesive wings for 2 additional minutes.   While looking in a mirror, press and release button in center of patch.  A small green light will flash 3-4 times .  This will be your only indicator the monitor has been turned on.     Do not shower for the first 24 hours.  You may shower after the first 24 hours.   Press button if you feel a symptom. You will  hear a small click.  Record Date, Time and Symptom in the Patient Log Book.   When you are ready to remove patch, follow instructions on last 2 pages of Patient Log Book.  Stick patch monitor onto last page of Patient Log Book.   Place Patient Log Book in [place] box.  Use locking tab on box and tape box closed securely.  The [employer] has [employer] on it.  Please place in mailbox as soon as possible.  Your physician should have your test results  approximately 7 days after the monitor has been mailed back to [facility].   Call [facility] at [phone] if you have questions regarding your ZIO XT patch monitor.  Call them immediately if you see an orange light blinking on your monitor.   If your monitor falls off in less than 4 days contact our Monitor department at [phone].  If your monitor becomes loose or falls off after 4 days call [employer] at [phone] for suggestions on securing your monitor.

## 2020-08-25 NOTE — Addendum Note (Signed)
Addended by: Sherrie Mustache on: 08/25/2020 04:28 PM   Modules accepted: Orders

## 2020-08-26 LAB — TSH: TSH: 2.64 u[IU]/mL (ref 0.450–4.500)

## 2020-08-27 ENCOUNTER — Other Ambulatory Visit (HOSPITAL_COMMUNITY)
Admission: RE | Admit: 2020-08-27 | Discharge: 2020-08-27 | Disposition: A | Discharge status: Home / Self Care | Payer: Medicare Other | Source: Ambulatory Visit | Attending: Cardiology | Admitting: Cardiology

## 2020-08-27 DIAGNOSIS — Z01812 Encounter for preprocedural laboratory examination: Secondary | ICD-10-CM | POA: Insufficient documentation

## 2020-08-27 DIAGNOSIS — Z20822 Contact with and (suspected) exposure to covid-19: Secondary | ICD-10-CM | POA: Insufficient documentation

## 2020-08-27 LAB — SARS CORONAVIRUS 2 (TAT 6-24 HRS): SARS Coronavirus 2: NEGATIVE

## 2020-08-30 ENCOUNTER — Telehealth (HOSPITAL_COMMUNITY): Payer: Self-pay | Admitting: *Deleted

## 2020-08-30 NOTE — Telephone Encounter (Signed)
Close encounter 

## 2020-08-31 ENCOUNTER — Other Ambulatory Visit: Payer: Self-pay

## 2020-08-31 ENCOUNTER — Other Ambulatory Visit (HOSPITAL_COMMUNITY): Payer: Medicare Other

## 2020-08-31 ENCOUNTER — Ambulatory Visit (HOSPITAL_COMMUNITY)
Admission: RE | Admit: 2020-08-31 | Discharge: 2020-08-31 | Disposition: A | Discharge status: Home / Self Care | Payer: Medicare Other | Source: Ambulatory Visit | Attending: Cardiovascular Disease | Admitting: Cardiovascular Disease

## 2020-08-31 DIAGNOSIS — R079 Chest pain, unspecified: Secondary | ICD-10-CM | POA: Diagnosis not present

## 2020-08-31 LAB — EXERCISE TOLERANCE TEST
Estimated workload: 7 METS
Exercise duration (min): 5 min
Exercise duration (sec): 0 s
MPHR: 146 {beats}/min
Peak HR: 139 {beats}/min
Percent HR: 95 %
Rest HR: 75 {beats}/min

## 2020-09-01 ENCOUNTER — Encounter: Payer: Self-pay | Admitting: Family Medicine

## 2020-09-01 ENCOUNTER — Other Ambulatory Visit: Payer: Self-pay

## 2020-09-01 ENCOUNTER — Ambulatory Visit (INDEPENDENT_AMBULATORY_CARE_PROVIDER_SITE_OTHER): Payer: Medicare Other | Admitting: Family Medicine

## 2020-09-01 DIAGNOSIS — I10 Essential (primary) hypertension: Secondary | ICD-10-CM

## 2020-09-01 DIAGNOSIS — R002 Palpitations: Secondary | ICD-10-CM

## 2020-09-01 NOTE — Progress Notes (Signed)
Patient ID: Kayla [name], female    DOB: 05/08/1946  Age: 74 y.o. MRN: [id number]    Subjective:  Subjective  HPI KIMARIE COOR presents for f/u cardiology for palpitations --- she had a stress test yesterday that was non diagnostic ----- coronary cta will be scheduled and she is waiting for 2 week event monitor as well   Pt is doing well -- no new complaints   Review of Systems  Constitutional: Negative for appetite change, diaphoresis, fatigue and unexpected weight change.  Eyes: Negative for pain, redness and visual disturbance.  Respiratory: Negative for cough, chest tightness, shortness of breath and wheezing.   Cardiovascular: Negative for chest pain, palpitations and leg swelling.  Endocrine: Negative for cold intolerance, heat intolerance, polydipsia, polyphagia and polyuria.  Genitourinary: Negative for difficulty urinating, dysuria and frequency.  Neurological: Negative for dizziness, light-headedness, numbness and headaches.    History Past Medical History:  Diagnosis Date  . Abscess of left groin 11/10/2014  . Asthma   . Cellulitis 10/20/2014  . Diverticulitis   . Diverticulosis   . Fasting hyperglycemia 04/2011   FBS 108  . Fibromyalgia   . Fundic gland polyps of stomach, benign   . GERD (gastroesophageal reflux disease)    gastric polyp x3  . Glaucoma     Dr Bing Plume  . IBS (irritable bowel syndrome)   . Osteopenia    last 07/2011  . Pneumonia     OP as child  . Shingles     She has a past surgical history that includes Spine surgery; Colonoscopy w/ polypectomy (3790,2409,7353); and Esophagogastroduodenoscopy.   Her family history includes Asthma in her paternal aunt; Breast cancer in her maternal aunt, mother, paternal aunt, sister, and another family member; Coronary artery disease in her paternal aunt; Deep vein thrombosis in her son; Heart Problems in her son; Heart attack in her paternal grandmother and paternal uncle; Heart disease in her maternal aunt  and paternal grandfather; Heart failure in her father; Kidney disease in her maternal grandmother; Ovarian cancer in an other family member; Pulmonary embolism in her son.She reports that she quit smoking about 46 years ago. Her smoking use included cigarettes. She has a 5.00 pack-year smoking history. She has never used smokeless tobacco. She reports that she does not drink alcohol and does not use drugs.  Current Outpatient Medications on File Prior to Visit  Medication Sig Dispense Refill  . acetaminophen (TYLENOL) 500 MG tablet Take 500 mg by mouth as needed.    Marland Kitchen albuterol (PROVENTIL) (2.5 MG/3ML) 0.083% nebulizer solution Take 3 mLs (2.5 mg total) by nebulization every 6 (six) hours as needed for wheezing or shortness of breath. 75 mL 12  . albuterol (VENTOLIN HFA) 108 (90 Base) MCG/ACT inhaler Inhale 2 puffs into the lungs every 6 (six) hours as needed for wheezing or shortness of breath. As needed only 18 g 3  . amLODipine (NORVASC) 2.5 MG tablet Take 1 tablet (2.5 mg total) by mouth daily. 90 tablet 1  . bifidobacterium infantis (ALIGN) capsule Take 1 capsule by mouth daily. 21 capsule 0  . carboxymethylcellulose (REFRESH PLUS) 0.5 % SOLN 1 drop 3 (three) times daily as needed.    . cholecalciferol (VITAMIN D) 1000 units tablet Take 2,000 Units by mouth daily.    Marland Kitchen EPINEPHrine (EPIPEN 2-PAK) 0.3 mg/0.3 mL IJ SOAJ injection As directed 1 each 0  . famotidine (PEPCID) 10 MG tablet Take 10 mg by mouth at bedtime. Alternating with 20mg  tablets she had    .  fluticasone (FLONASE) 50 MCG/ACT nasal spray USE 2 SPRAYS IN EACH  NOSTRIL ONCE A DAY 48 g 3  . hyoscyamine (LEVSIN SL) 0.125 MG SL tablet Place 1 tablet (0.125 mg total) under the tongue every 6 (six) hours as needed. 10 tablet 1  . latanoprost (XALATAN) 0.005 % ophthalmic solution Place 1 drop into both eyes at bedtime.    . metoprolol tartrate (LOPRESSOR) 25 MG tablet Take 1 tablet (25 mg total) by mouth 2 (two) times daily. 180 tablet 1   . montelukast (SINGULAIR) 10 MG tablet TAKE 1 TABLET(10 MG) BY MOUTH DAILY. 90 tablet 2  . nystatin cream (MYCOSTATIN) Apply 1 application topically 2 (two) times daily. 90 g 3  . Omega-3 Fatty Acids (FISH OIL PO) Take by mouth.    Bertram Gala Glycol-Propyl Glycol (SYSTANE) 0.4-0.3 % GEL ophthalmic gel Place 1 application into both eyes.    . sodium chloride (OCEAN) 0.65 % SOLN nasal spray Place 1 spray into both nostrils as needed for congestion.    . triamcinolone (KENALOG) 0.1 % Apply 1 application topically 2 (two) times daily. 90 g 3  . Vitamin Mixture (ESTER-C PO) Take 1,000 mg by mouth daily.     No current facility-administered medications on file prior to visit.     Objective:  Objective  Physical Exam Vitals and nursing note reviewed.  Constitutional:      Appearance: She is well-developed and well-nourished.  HENT:     Head: Normocephalic and atraumatic.  Eyes:     Extraocular Movements: EOM normal.     Conjunctiva/sclera: Conjunctivae normal.  Neck:     Thyroid: No thyromegaly.     Vascular: No carotid bruit or JVD.  Cardiovascular:     Rate and Rhythm: Normal rate and regular rhythm.     Heart sounds: Normal heart sounds. No murmur heard.   Pulmonary:     Effort: Pulmonary effort is normal. No respiratory distress.     Breath sounds: Normal breath sounds. No wheezing or rales.  Chest:     Chest wall: No tenderness.  Musculoskeletal:        General: No edema.     Cervical back: Normal range of motion and neck supple. No tenderness.  Lymphadenopathy:     Cervical: No cervical adenopathy.  Neurological:     Mental Status: She is alert and oriented to person, place, and time.  Psychiatric:        Mood and Affect: Mood and affect normal.    BP 130/70 (BP Location: Right Arm, Patient Position: Sitting, Cuff Size: Normal)   Pulse (!) 56   Temp 98.7 F (37.1 C) (Oral)   Resp 18   Ht 5\' 1"  (1.549 m)   Wt 153 lb 12.8 oz (69.8 kg)   SpO2 98%   BMI 29.06 kg/m   Wt Readings from Last 3 Encounters:  09/01/20 153 lb 12.8 oz (69.8 kg)  08/25/20 154 lb 6.4 oz (70 kg)  08/02/20 150 lb (68 kg)     Lab Results  Component Value Date   WBC 4.2 08/02/2020   HGB 13.0 08/02/2020   HCT 38.9 08/02/2020   PLT 163 08/02/2020   GLUCOSE 99 08/02/2020   CHOL 210 (H) 06/15/2020   TRIG 94 06/15/2020   HDL 65 06/15/2020   LDLDIRECT 153.3 12/18/2012   LDLCALC 125 (H) 06/15/2020   ALT 18 08/02/2020   AST 26 08/02/2020   NA 135 08/02/2020   K 3.8 08/02/2020   CL 102 08/02/2020  CREATININE 0.65 08/02/2020   BUN 13 08/02/2020   CO2 25 08/02/2020   TSH 2.640 08/25/2020   INR 0.97 12/15/2009   HGBA1C 5.4 11/01/2011    EXERCISE TOLERANCE TEST (ETT)  Result Date: 08/31/2020  Blood pressure demonstrated a normal response to exercise.  Horizontal ST segment depression ST segment depression of 2 mm was noted during stress in the II, III, aVF, V4, V5 and V6 leads.  Non diagnostic treadmill due to baseline ECG abnormalities With stress 2 mm horizontal ST depression However baseline ECG had 1 mm ST depression in inferior lateral leads Suggest f/u CTA or myovue     Assessment & Plan:  Plan  I am having Zerline M. Mcelhaney maintain her acetaminophen, bifidobacterium infantis, Omega-3 Fatty Acids (FISH OIL PO), sodium chloride, carboxymethylcellulose, Vitamin Mixture (ESTER-C PO), cholecalciferol, Polyethyl Glycol-Propyl Glycol, latanoprost, famotidine, fluticasone, EPINEPHrine, albuterol, hyoscyamine, nystatin cream, montelukast, albuterol, metoprolol tartrate, amLODipine, and triamcinolone.  No orders of the defined types were placed in this encounter.   Problem List Items Addressed This Visit      Unprioritized   Essential hypertension    Well controlled, no changes to meds. Encouraged heart healthy diet such as the DASH diet and exercise as tolerated.        Palpitations    Stress test non diagnostic Coronary cta to be done 2 week event monitor to be  done          Follow-up: Return in about 6 months (around 03/02/2021), or if symptoms worsen or fail to improve, for annual exam, fasting.  [name], DO

## 2020-09-01 NOTE — Assessment & Plan Note (Signed)
Stress test non diagnostic Coronary cta to be done 2 week event monitor to be done

## 2020-09-01 NOTE — Assessment & Plan Note (Signed)
Well controlled, no changes to meds. Encouraged heart healthy diet such as the DASH diet and exercise as tolerated.  °

## 2020-09-01 NOTE — Patient Instructions (Signed)

## 2020-09-07 ENCOUNTER — Telehealth: Payer: Self-pay | Admitting: Cardiology

## 2020-09-07 ENCOUNTER — Other Ambulatory Visit: Payer: Self-pay

## 2020-09-07 DIAGNOSIS — R079 Chest pain, unspecified: Secondary | ICD-10-CM

## 2020-09-07 NOTE — Telephone Encounter (Signed)
Patient returning a call from our office to discuss test results. Please call back

## 2020-09-07 NOTE — Telephone Encounter (Signed)
Spoke with pt on the phone. Pt had questions about exercise tolerance test.  Explained to pt that results are nondiagnostic and we are unable to get a definitive answer from this test.  Pt states she received a phone call with instructions.  All questions addressed. Pt states understanding and is appreciative. Encouraged pt to call back if any additional questions arise.

## 2020-09-21 DIAGNOSIS — R002 Palpitations: Secondary | ICD-10-CM | POA: Diagnosis not present

## 2020-09-22 ENCOUNTER — Other Ambulatory Visit: Payer: Self-pay | Admitting: Family Medicine

## 2020-09-22 DIAGNOSIS — I10 Essential (primary) hypertension: Secondary | ICD-10-CM

## 2020-09-27 DIAGNOSIS — I10 Essential (primary) hypertension: Secondary | ICD-10-CM

## 2020-09-28 NOTE — Telephone Encounter (Signed)
If not getting enough calcium in diet -- she should be taking 1200 mg daily and vita d 1000u daily Weight bearing exercise can also help

## 2020-09-30 MED ORDER — METOPROLOL TARTRATE 25 MG PO TABS: 25.0000 mg | ORAL_TABLET | Freq: Two times a day (BID) | ORAL | 3 refills | Status: DC

## 2020-09-30 MED ORDER — METOPROLOL TARTRATE 25 MG PO TABS: 25.0000 mg | ORAL_TABLET | Freq: Every day | ORAL | 6 refills | Status: DC | PRN

## 2020-10-03 DIAGNOSIS — R079 Chest pain, unspecified: Secondary | ICD-10-CM | POA: Diagnosis not present

## 2020-10-04 LAB — BASIC METABOLIC PANEL
BUN/Creatinine Ratio: 22 (ref 12–28)
BUN: 15 mg/dL (ref 8–27)
CO2: 24 mmol/L (ref 20–29)
Calcium: 9.7 mg/dL (ref 8.7–10.3)
Chloride: 100 mmol/L (ref 96–106)
Creatinine, Ser: 0.67 mg/dL (ref 0.57–1.00)
GFR calc Af Amer: 99 mL/min/{1.73_m2} (ref >59)
GFR calc non Af Amer: 86 mL/min/{1.73_m2} (ref >59)
Glucose: 92 mg/dL (ref 65–99)
Potassium: 5.2 mmol/L (ref 3.5–5.2)
Sodium: 137 mmol/L (ref 134–144)

## 2020-10-06 ENCOUNTER — Telehealth (HOSPITAL_COMMUNITY): Payer: Self-pay | Admitting: *Deleted

## 2020-10-06 NOTE — Telephone Encounter (Signed)
Reaching out to patient to offer assistance regarding upcoming cardiac imaging study; pt verbalizes understanding of appt date/time, parking situation and where to check in, pre-test NPO status, and verified current allergies; name and call back number provided for further questions should they arise  Nevaan Bunton RN Navigator Cardiac Imaging [facility] [phone] office [phone] cell  

## 2020-10-07 ENCOUNTER — Ambulatory Visit (HOSPITAL_COMMUNITY)
Admission: RE | Admit: 2020-10-07 | Discharge: 2020-10-07 | Disposition: A | Discharge status: Home / Self Care | Payer: Medicare Other | Source: Ambulatory Visit | Attending: Cardiology | Admitting: Cardiology

## 2020-10-07 ENCOUNTER — Other Ambulatory Visit: Payer: Self-pay

## 2020-10-07 DIAGNOSIS — I700 Atherosclerosis of aorta: Secondary | ICD-10-CM | POA: Insufficient documentation

## 2020-10-07 DIAGNOSIS — R079 Chest pain, unspecified: Secondary | ICD-10-CM | POA: Insufficient documentation

## 2020-10-07 DIAGNOSIS — I2510 Atherosclerotic heart disease of native coronary artery without angina pectoris: Secondary | ICD-10-CM | POA: Insufficient documentation

## 2020-10-07 DIAGNOSIS — Z006 Encounter for examination for normal comparison and control in clinical research program: Secondary | ICD-10-CM

## 2020-10-07 MED ORDER — NITROGLYCERIN 0.4 MG SL SUBL
SUBLINGUAL_TABLET | SUBLINGUAL | Status: AC
  Filled 2020-10-07: qty 2

## 2020-10-07 MED ORDER — IOHEXOL 350 MG/ML SOLN
80.0000 mL | Freq: Once | INTRAVENOUS | Status: AC | PRN
  Administered 2020-10-07: 80 mL via INTRAVENOUS

## 2020-10-07 MED ORDER — NITROGLYCERIN 0.4 MG SL SUBL
0.8000 mg | SUBLINGUAL_TABLET | Freq: Once | SUBLINGUAL | Status: AC
  Administered 2020-10-07: 0.8 mg via SUBLINGUAL

## 2020-10-07 NOTE — Research (Signed)
IDENTIFY  Informed Consent   Subject Name: Kayla [name]  Subject met inclusion and exclusion criteria.  The informed consent form, study requirements and expectations were reviewed with the subject and questions and concerns were addressed prior to the signing of the consent form.  The subject verbalized understanding of the trial requirements.  The subject agreed to participate in the IDENTIFY trial and signed the informed consent at 1036 on 28/JAN/2022.  The informed consent was obtained prior to performance of any protocol-specific procedures for the subject.  A copy of the signed informed consent was given to the subject and a copy was placed in the subject's medical record.   Sherlyn Lees  \

## 2020-10-11 ENCOUNTER — Ambulatory Visit: Payer: Medicare Other | Attending: Internal Medicine

## 2020-10-11 ENCOUNTER — Other Ambulatory Visit (HOSPITAL_BASED_OUTPATIENT_CLINIC_OR_DEPARTMENT_OTHER): Payer: Self-pay | Admitting: Internal Medicine

## 2020-10-11 DIAGNOSIS — Z23 Encounter for immunization: Secondary | ICD-10-CM

## 2020-10-11 MED FILL — PFIZER-BIONTECH COVID-19 VA: 30 | 1 days supply | Qty: 0 | Fill #0

## 2020-10-11 NOTE — Progress Notes (Signed)
   Covid-19 Vaccination Clinic  Name:  PAYTIN RAMAKRISHNAN    MRN: [id number] DOB: 13-May-1946  10/11/2020  Ms. Santizo was observed post Covid-19 immunization for 30 minutes based on pre-vaccination screening without incident. She was provided with Vaccine Information Sheet and instruction to access the V-Safe system. Vaccinated by [employer].  Ms. Shuey was instructed to call 911 with any severe reactions post vaccine: Marland Kitchen Difficulty breathing  . Swelling of face and throat  . A fast heartbeat  . A bad rash all over body  . Dizziness and weakness   Immunizations Administered    Name Date Dose VIS Date Route   PFIZER Comrnaty(Gray TOP) Covid-19 Vaccine 10/11/2020  1:29 PM 0.3 mL 08/18/2020 Intramuscular   Manufacturer: [employer]   Lot: [id number]   NDC: [phone]

## 2020-10-20 ENCOUNTER — Other Ambulatory Visit: Payer: Self-pay | Admitting: Family Medicine

## 2020-10-20 DIAGNOSIS — T7840XD Allergy, unspecified, subsequent encounter: Secondary | ICD-10-CM

## 2020-12-06 ENCOUNTER — Telehealth: Payer: Self-pay | Admitting: Cardiology

## 2020-12-06 NOTE — Telephone Encounter (Signed)
Spoke with patient regarding elevated HR Per patient she was walking on elliptical working only her legs She was at a slow pace and had only walked about 2 minutes when her heart rate shot  up to 150's She slowed way down to the point machine almost turned off HR came down to 70's. Tried to speed up walk some, shot back up Per patient she was never walking fast nor could she feel her heart racing. Only reason she knew is that she had her fingers on the device that monitors HR on elliptical  Denied any shortness of breath On the way home she did feel some heart palpitations but by the time she got home felt nothing Patient would like to know what point she should be concerned, max HR and how long should keep there, and when to stop Will forward to Dr Percival Spanish for review

## 2020-12-06 NOTE — Telephone Encounter (Signed)
STAT if HR is under 50 or over 120 (normal HR is 60-100 beats per minute)  1) What is your heart rate?   No current reading available. She states her HR normally stays around 52 resting.  2) Do you have a log of your heart rate readings (document readings)?   Patient states when she last saw Dr. Percival Spanish she was advised to exercise more. She states she has taken his advise and tries do do a little more around the house.  She recently went to the gym for the first time in a while and she started slow, but she states after about 2 minutes on a machine her HR increased to about 153. She would like to know what Dr. Percival Spanish thinks her normal HR parameters are. She states she does not plan to go back to the gym until she hears back from [place]. She  would also like to know if she needs to adjust her medication. She is asymptomatic. Please advise.    3) Do you have any other symptoms?  No

## 2020-12-07 NOTE — Telephone Encounter (Signed)
She might be having SVT but I cannot tell this.  It would be good for her to get a Fitbit or Apple watch so we could see if these episodes are SVT.  If it comes right down when she stops it might not be and sinus tach would not be concerning.

## 2020-12-09 ENCOUNTER — Other Ambulatory Visit: Payer: Self-pay | Admitting: *Deleted

## 2020-12-14 NOTE — Telephone Encounter (Signed)
Spoke with patient and she will proceed with Apple watch

## 2020-12-14 NOTE — Telephone Encounter (Signed)
Spoke with patient on telephone

## 2020-12-21 ENCOUNTER — Telehealth: Payer: Self-pay | Admitting: Cardiology

## 2020-12-21 NOTE — Telephone Encounter (Signed)
Returned call to patient to discuss-patient got Apple watch as recommended and has been checking her HR and wanted to make sure her readings were okay.  She states her HR has been 40-50s during the day, moving around up to 70s, sleeping 43-52.    She reports today her avg shows 43?Marland Kitchen    She denies any symptoms, no dizziness, sob, lightheadedness, palpitations.      Last OV:  08/2020, HR noted 53.     Monitor brief runs of SVT. Phone note last week noted high HR, possible episodes of SVT.  On metoprolol 25 BID.    She wanted to make Dr. Percival Spanish aware of readings and make sure these were "ok".  Advised since she is asymptomatic, continue to monitor and will send to Dr.Hochrein to review.     Patient does report she may be allergic to the apple watch and have to take it back.  She developed a red place under the watch, moved it up her arm and another red spot developed.   Associate at [employer] told her she may be allergic to nickel.  Dicussed Fitbit but unsure if nickel free, she will look into this.

## 2020-12-21 NOTE — Telephone Encounter (Signed)
Patient called to give her heartrate since get her apple watch. Patient stated she didn't know if it was good or bad. During the day its  46, 47, 45. 43 overnight it has been from 44-52. Please advise

## 2020-12-25 NOTE — Telephone Encounter (Signed)
I don't see any significant arrhythmias.  I would continue current therapy.  Call Ms. Rollinson with the results and send results to Carollee Herter, Alferd Apa, DO

## 2020-12-26 ENCOUNTER — Encounter: Payer: Self-pay | Admitting: Family Medicine

## 2020-12-26 NOTE — Telephone Encounter (Signed)
You do not need to be tested --- you just need to avoid nickel You can use otc cortisone cream for the rash You could look into [employer]--- through [facility]--- that is a portable ekg you can attach to your phone and send your cardiologist an ekg as well

## 2020-12-27 NOTE — Telephone Encounter (Signed)
See my chart encounters

## 2020-12-27 NOTE — Telephone Encounter (Signed)
Yes -- it does sound like a contact dermatitis

## 2021-01-06 ENCOUNTER — Telehealth: Payer: Self-pay

## 2021-01-06 NOTE — Telephone Encounter (Signed)
FYI: I spoke to the pt. She says the night light weighed 1.5 lbs. Pt says the after hours nurse was very detailed and thorough. She says she can not even tell the accident occurred at this point. I have let pt know to go to the ER immediately if she starts to experience any dizziness, confusion, loss of balance, HA etc. She understands and voices understanding.

## 2021-01-06 NOTE — Telephone Encounter (Signed)
Nurse Assessment Nurse: Gareth Eagle, RN, Raquel Sarna Date/Time Kayla [name] Time): 01/06/2021 1:39:58 PM Confirm and document reason for call. If symptomatic, describe symptoms. ---Caller states she was dusting and a decorative lighthouse fell from a shelf onto her head. No broken skin, swelling discoloration. Does the patient have any new or worsening symptoms? ---Yes Will a triage be completed? ---Yes Related visit to physician within the last 2 weeks? ---No Does the PT have any chronic conditions? (i.e. diabetes, asthma, this includes High risk factors for pregnancy, etc.) ---Yes List chronic conditions. ---asthma, HTN, arrhythmia Is this a behavioral health or substance abuse call? ---No Guidelines Guideline Title Affirmed Question Affirmed Notes Nurse Date/Time (Eastern Time) Head Injury Scalp swelling, bruise or pain Theodoro Grist 01/06/2021 1:42:25 PM Disp. Time Kayla [name] Time) Disposition Final User 01/06/2021 1:45:39 [place], RN, Raquel Sarna Caller Disagree/Comply Comply Caller Understands Yes PLEASE NOTE: All timestamps contained within this report are represented as [place] Standard Time. CONFIDENTIALTY NOTICE: This fax transmission is intended only for the addressee. It contains information that is legally privileged, confidential or otherwise protected from use or disclosure. If you are not the intended recipient, you are strictly prohibited from reviewing, disclosing, copying using or disseminating any of this information or taking any action in reliance on or regarding this information. If you have received this fax in error, please notify us immediately by telephone so that we can arrange for its return to [place]. Phone: [phone], Toll-Free: [phone], Fax: [phone] Page: 2 of 2 Call Id: [id number] PreDisposition Did not know what to do Care Advice Given Per Guideline HOME CARE: * You should be able to treat this at home. OBSERVATION: * The head-injured person  should be observed closely during the first 2 hours following the injury. * Some mild headache, mild dizziness and nausea are common. CALL BACK IF: * Severe headache persists over 2 hours after ice pack and pain medications * Extremity weakness or numbness occurs * Slurred speech or blurred vision occurs * Vomiting occurs * You become worse CARE ADVICE given per Head Injury (Adult) guideline

## 2021-01-23 ENCOUNTER — Telehealth: Payer: Self-pay

## 2021-01-23 DIAGNOSIS — Z006 Encounter for examination for normal comparison and control in clinical research program: Secondary | ICD-10-CM

## 2021-01-23 NOTE — Telephone Encounter (Signed)
Called patient for 90 day Identify phone call pt stated she is still having the cardiac symptoms and she has followed up with her doctor regarding the palpitations, I reminded the patient that we would be calling her back around January for a year follow up phone call.

## 2021-02-01 NOTE — Progress Notes (Signed)
Subjective:   Kayla [name] is a 75 y.o. female who presents for Medicare Annual (Subsequent) preventive examination.  Review of Systems     Cardiac Risk Factors include: advanced age (>3men, >11 women);hypertension;dyslipidemia     Objective:    Today's Vitals   02/02/21 1231  Weight: 156 lb (70.8 kg)  Height: 5\' 1"  (1.549 m)   Body mass index is 29.48 kg/m.  Advanced Directives 02/02/2021 08/02/2020 03/05/2020 01/05/2020 04/13/2019 01/05/2019 07/17/2017  Does Patient Have a Medical Advance Directive? Yes No Yes Yes No Yes No  Type of Paramedic of [place];Living will - - [place];Living will - [place];Living will -  Does patient want to make changes to medical advance directive? - - - No - Patient declined - No - Patient declined -  Copy of [place] in Chart? No - copy requested - - No - copy requested - No - copy requested -  Would patient like information on creating a medical advance directive? - - - - No - Patient declined - No - Patient declined    Current Medications (verified) Outpatient Encounter Medications as of 02/02/2021  Medication Sig  . acetaminophen (TYLENOL) 500 MG tablet Take 500 mg by mouth as needed.  Marland Kitchen albuterol (PROVENTIL) (2.5 MG/3ML) 0.083% nebulizer solution Take 3 mLs (2.5 mg total) by nebulization every 6 (six) hours as needed for wheezing or shortness of breath.  Marland Kitchen albuterol (VENTOLIN HFA) 108 (90 Base) MCG/ACT inhaler Inhale 2 puffs into the lungs every 6 (six) hours as needed for wheezing or shortness of breath. As needed only  . amLODipine (NORVASC) 2.5 MG tablet TAKE 1 TABLET(2.5 MG) BY MOUTH DAILY  . bifidobacterium infantis (ALIGN) capsule Take 1 capsule by mouth daily.  . carboxymethylcellulose (REFRESH PLUS) 0.5 % SOLN 1 drop 3 (three) times daily as needed.  . cholecalciferol (VITAMIN D) 1000 units tablet Take 2,000 Units by mouth daily.  Marland Kitchen COVID-19 mRNA  vaccine, Pfizer, 30 MCG/0.3ML injection INJECT AS DIRECTED  . EPINEPHrine (EPIPEN 2-PAK) 0.3 mg/0.3 mL IJ SOAJ injection As directed  . famotidine (PEPCID) 10 MG tablet Take 10 mg by mouth at bedtime. Alternating with 20mg  tablets she had  . fluticasone (FLONASE) 50 MCG/ACT nasal spray USE 2 SPRAYS IN EACH  NOSTRIL ONCE A DAY  . hyoscyamine (LEVSIN SL) 0.125 MG SL tablet Place 1 tablet (0.125 mg total) under the tongue every 6 (six) hours as needed.  . latanoprost (XALATAN) 0.005 % ophthalmic solution Place 1 drop into both eyes at bedtime.  . montelukast (SINGULAIR) 10 MG tablet Take 1 tablet (10 mg total) by mouth at bedtime.  Marland Kitchen nystatin cream (MYCOSTATIN) Apply 1 application topically 2 (two) times daily.  . Omega-3 Fatty Acids (FISH OIL PO) Take by mouth.  Vladimir Faster Glycol-Propyl Glycol (SYSTANE) 0.4-0.3 % GEL ophthalmic gel Place 1 application into both eyes.  . sodium chloride (OCEAN) 0.65 % SOLN nasal spray Place 1 spray into both nostrils as needed for congestion.  . triamcinolone (KENALOG) 0.1 % Apply 1 application topically 2 (two) times daily.  . Vitamin Mixture (ESTER-C PO) Take 1,000 mg by mouth daily.  . metoprolol tartrate (LOPRESSOR) 25 MG tablet Take 1 tablet (25 mg total) by mouth 2 (two) times daily. May take 1 additional tablet daily as needed for palpitations.   No facility-administered encounter medications on file as of 02/02/2021.    Allergies (verified) Cephalosporins, Gabapentin, Levofloxacin, Psyllium, Sulfonamide derivatives, Aspirin, Belladonna,  Ciprofloxacin hcl, Conjugated estrogens, Doxycycline, Flovent hfa [fluticasone], Nabumetone, Nsaids, Soybean-containing drug products, Zicam cold remedy [homeopathic products], Zocor [simvastatin], Nickel, and Prednisone   History: Past Medical History:  Diagnosis Date  . Abscess of left groin 11/10/2014  . Asthma   . Cellulitis 10/20/2014  . Diverticulitis   . Diverticulosis   . Fasting hyperglycemia 04/2011   FBS 108   . Fibromyalgia   . Fundic gland polyps of stomach, benign   . GERD (gastroesophageal reflux disease)    gastric polyp x3  . Glaucoma     Dr Bing Plume  . IBS (irritable bowel syndrome)   . Osteopenia    last 07/2011  . Pneumonia     OP as child  . Shingles    Past Surgical History:  Procedure Laterality Date  . COLONOSCOPY W/ POLYPECTOMY  [phone]   last  colonoscopy 2007, Dr Carlean Purl  . ESOPHAGOGASTRODUODENOSCOPY    . SPINE SURGERY     T9-L5 fusions   Family History  Problem Relation Age of Onset  . Breast cancer Mother   . Breast cancer Sister   . Breast cancer Maternal Aunt        two  . Breast cancer Paternal Aunt   . Asthma Paternal Aunt   . Heart failure Father        CHF, CABG late 65s  . Kidney disease Maternal Grandmother   . Ovarian cancer Other        Niece x2  . Breast cancer Other        niece  . Coronary artery disease Paternal Aunt        triple CABG  . Heart attack Paternal Grandmother        MI in late 37s  . Heart disease Paternal Grandfather   . Heart attack Paternal Uncle         MI in 68s  . Heart disease Maternal Aunt   . Pulmonary embolism Son   . Deep vein thrombosis Son   . Heart Problems Son        Heart attacks related to a "blood disorder"   . Diabetes Neg Hx   . Stroke Neg Hx   . COPD Neg Hx   . Colon polyps Neg Hx    Social History   Socioeconomic History  . Marital status: Married    Spouse name: Not on file  . Number of children: Not on file  . Years of education: Not on file  . Highest education level: Not on file  Occupational History  . Occupation: retired  Tobacco Use  . Smoking status: Former Smoker    Packs/day: 0.50    Years: 10.00    Pack years: 5.00    Types: Cigarettes    Quit date: 09/10/1974    Years since quitting: 46.4  . Smokeless tobacco: Never Used  . Tobacco comment: smoked 1966- 1976, up to 1 ppd  Substance and Sexual Activity  . Alcohol use: No    Alcohol/week: 0.0 standard drinks  . Drug  use: No  . Sexual activity: Yes  Other Topics Concern  . Not on file  Social History Narrative   Daily caffeine 3 cups   Regular exercise   Married         Social Determinants of Health   Financial Resource Strain: Low Risk   . Difficulty of Paying Living Expenses: Not hard at all  Food Insecurity: No Food Insecurity  . Worried About Charity fundraiser in  the Last Year: Never true  . Ran Out of Food in the Last Year: Never true  Transportation Needs: No Transportation Needs  . Lack of Transportation (Medical): No  . Lack of Transportation (Non-Medical): No  Physical Activity: Inactive  . Days of Exercise per Week: 0 days  . Minutes of Exercise per Session: 0 min  Stress: No Stress Concern Present  . Feeling of Stress : Not at all  Social Connections: Socially Isolated  . Frequency of Communication with Friends and Family: More than three times a week  . Frequency of Social Gatherings with Friends and Family: More than three times a week  . Attends Religious Services: Never  . Active Member of Clubs or Organizations: No  . Attends Archivist Meetings: Never  . Marital Status: Divorced    Tobacco Counseling Counseling given: Not Answered Comment: smoked 1966- 1976, up to 1 ppd   Clinical Intake:  Pre-visit preparation completed: Yes  Pain : No/denies pain     Nutritional Status: BMI 25 -29 Overweight Nutritional Risks: None Diabetes: No  How often do you need to have someone help you when you read instructions, pamphlets, or other written materials from your doctor or pharmacy?: 1 - Never  Diabetic?No  Interpreter Needed?: No  Information entered by :: Caroleen Hamman LPN   Activities of Daily Living In your present state of health, do you have any difficulty performing the following activities: 02/02/2021  Hearing? N  Vision? N  Difficulty concentrating or making decisions? N  Walking or climbing stairs? N  Dressing or bathing? N  Doing  errands, shopping? N  Preparing Food and eating ? N  Using the Toilet? N  In the past six months, have you accidently leaked urine? N  Do you have problems with loss of bowel control? N  Managing your Medications? N  Managing your Finances? N  Housekeeping or managing your Housekeeping? N  Some recent data might be hidden    Patient Care Team: Carollee Herter, Alferd Apa, DO as PCP - General (Family Medicine) Gatha Mayer, MD as Consulting Physician (Gastroenterology) Calvert Cantor, MD as Consulting Physician (Ophthalmology) Beshears, Dorie Rank, DMD (Dentistry) Minus Breeding, MD as Consulting Physician (Cardiology) Izora Gala, MD as Consulting Physician (Otolaryngology) Lewanda Rife, Bennie Pierini., DDS as Consulting Physician (Oral Surgery) Gavin Pound, MD as Consulting Physician (Rheumatology) Philemon Kingdom, MD as Consulting Physician (Endocrinology) Ulla Gallo, MD as Consulting Physician (Dermatology)  Indicate any recent Medical Services you may have received from other than [facility] providers in the past year (date may be approximate).     Assessment:   This is a routine wellness examination for Kayla [name].  Hearing/Vision screen  Hearing Screening   125Hz  250Hz  500Hz  1000Hz  2000Hz  3000Hz  4000Hz  6000Hz  8000Hz   Right ear:           Left ear:           Comments: No issues  Vision Screening Comments: Wears glasses Last eye exam-4-5 months ago-Dr. Eulas Post  Dietary issues and exercise activities discussed: Current Exercise Habits: The patient does not participate in regular exercise at present, Exercise limited by: None identified  Goals Addressed            This Visit's Progress   . Patient Stated       Increase exercise as tolerated      Depression Screen PHQ 2/9 Scores 02/02/2021 01/05/2020 01/05/2019 01/02/2018 07/17/2017 07/02/2017 08/23/2016  PHQ - 2 Score 0 0 0 0 0 0  0  Exception Documentation - - - - - - -    Fall Risk Fall Risk  02/02/2021 01/05/2020  01/05/2019 01/02/2018 07/17/2017  Falls in the past year? 0 0 0 No No  Number falls in past yr: 0 0 - - -  Injury with Fall? 0 0 - - -  Follow up Falls prevention discussed Education provided;Falls prevention discussed - - -    FALL RISK PREVENTION PERTAINING TO THE HOME:  Any stairs in or around the home? Yes  If so, are there any without handrails? No  Home free of loose throw rugs in walkways, pet beds, electrical cords, etc? Yes  Adequate lighting in your home to reduce risk of falls? Yes   ASSISTIVE DEVICES UTILIZED TO PREVENT FALLS:  Life alert? No  Use of a cane, walker or w/c? No  Grab bars in the bathroom? Yes  Shower chair or bench in shower? No  Elevated toilet seat or a handicapped toilet? No   TIMED UP AND GO:  Was the test performed? Yes .  Length of time to ambulate 10 feet: 10 sec.   Gait steady and fast without use of assistive device  Cognitive Function:Normal cognitive status assessed by direct observation by this Nurse Health Advisor. No abnormalities found.   MMSE - Mini Mental State Exam 07/17/2017  Orientation to time 5  Orientation to Place 5  Registration 3  Attention/ Calculation 5  Recall 3  Language- name 2 objects 2  Language- repeat 1  Language- follow 3 step command 3  Language- read & follow direction 1  Write a sentence 1  Copy design 1  Total score 30        Immunizations Immunization History  Administered Date(s) Administered  . Influenza Whole 06/14/2008, 06/09/2010, 07/06/2011  . Influenza, High Dose Seasonal PF 06/10/2014, 05/26/2015, 05/24/2016, 07/02/2017, 05/26/2019  . Influenza, Seasonal, Injecte, Preservative Fre 05/26/2013  . Influenza-Unspecified 05/30/2012, 05/22/2013, 05/15/2018, 05/26/2019, 06/07/2020  . PFIZER Comirnaty(Gray Top)Covid-19 Tri-Sucrose Vaccine 10/11/2020  . PFIZER(Purple Top)SARS-COV-2 Vaccination 11/05/2019, 11/25/2019  . Pneumococcal Conjugate-13 01/11/2014  . Pneumococcal Polysaccharide-23  07/12/2011, 04/24/2019  . Td 03/07/2006  . Tdap 02/17/2015  . Zoster Recombinat (Shingrix) 12/08/2017, 03/21/2018  . Zoster, Live 08/19/2013    TDAP status: Up to date  Flu Vaccine status: Up to date  Pneumococcal vaccine status: Up to date  Covid-19 vaccine status: Completed vaccines  Qualifies for Shingles Vaccine? No   Zostavax completed Yes   Shingrix Completed?: Yes  Screening Tests Health Maintenance  Topic Date Due  . Zoster Vaccines- Shingrix (1 of 2) Never done  . INFLUENZA VACCINE  04/10/2021  . MAMMOGRAM  06/08/2021  . TETANUS/TDAP  02/16/2025  . COLONOSCOPY (Pts 45-62yrs Insurance coverage will need to be confirmed)  03/15/2026  . DEXA SCAN  Completed  . COVID-19 Vaccine  Completed  . Hepatitis C Screening  Completed  . PNA vac Low Risk Adult  Completed  . HPV VACCINES  Aged Out    Health Maintenance  Health Maintenance Due  Topic Date Due  . Zoster Vaccines- Shingrix (1 of 2) Never done    Colorectal cancer screening: No longer required.   Mammogram status: Completed bilateral 06/08/2020. Repeat every year  Bone Density status: Completed 04/22/2019. Results reflect: Bone density results: OSTEOPOROSIS. Repeat every 2 years.  Lung Cancer Screening: (Low Dose CT Chest recommended if Age 74-80 years, 30 pack-year currently smoking OR have quit w/in 15years.) does not qualify.     Additional Screening:  Hepatitis C Screening:Completed 06/03/2015  Vision Screening: Recommended annual ophthalmology exams for early detection of glaucoma and other disorders of the eye. Is the patient up to date with their annual eye exam?  Yes  Who is the provider or what is the name of the office in which the patient attends annual eye exams? Dr. Eulas Post   Dental Screening: Recommended annual dental exams for proper oral hygiene  Community Resource Referral / Chronic Care Management: CRR required this visit?  No   CCM required this visit?  No      Plan:     I  have personally reviewed and noted the following in the patient's chart:   . Medical and social history . Use of alcohol, tobacco or illicit drugs  . Current medications and supplements including opioid prescriptions.  . Functional ability and status . Nutritional status . Physical activity . Advanced directives . List of other physicians . Hospitalizations, surgeries, and ER visits in previous 12 months . Vitals . Screenings to include cognitive, depression, and falls . Referrals and appointments  In addition, I have reviewed and discussed with patient certain preventive protocols, quality metrics, and best practice recommendations. A written personalized care plan for preventive services as well as general preventive health recommendations were provided to patient.   Patient to access avs on mychart  Marta Antu, [place]   [id number]/[id number]  Nurse Health Advisor  Nurse Notes: None

## 2021-02-02 ENCOUNTER — Ambulatory Visit (INDEPENDENT_AMBULATORY_CARE_PROVIDER_SITE_OTHER): Payer: Medicare Other

## 2021-02-02 ENCOUNTER — Encounter: Payer: Self-pay | Admitting: Family Medicine

## 2021-02-02 ENCOUNTER — Other Ambulatory Visit: Payer: Self-pay

## 2021-02-02 VITALS — Ht 61.0 in | Wt 156.0 lb

## 2021-02-02 DIAGNOSIS — Z0000 Encounter for general adult medical examination without abnormal findings: Secondary | ICD-10-CM

## 2021-02-02 NOTE — Patient Instructions (Signed)
Kayla [name] , Thank you for taking time to come for your Medicare Wellness Visit. I appreciate your ongoing commitment to your health goals. Please review the following plan we discussed and let me know if I can assist you in the future.   Screening recommendations/referrals: Colonoscopy: No longer required Mammogram: Completed 06/08/2020-Due 06/08/2021 Bone Density: Completed 04/22/2019-Due-04/21/2021 Recommended yearly ophthalmology/optometry visit for glaucoma screening and checkup Recommended yearly dental visit for hygiene and checkup  Vaccinations: Influenza vaccine: Up to date Pneumococcal vaccine: Completed vaccines Tdap vaccine: Up to date-Due-02/16/2025 Shingles vaccine: Completed vaccines   Covid-19:Up to date  Advanced directives: Please bring a copy for your chart  Conditions/risks identified: See problem list  Next appointment: Follow up in one year for your annual wellness visit 02/08/2022 @ 12:40   Preventive Care 65 Years and Older, Female Preventive care refers to lifestyle choices and visits with your health care provider that can promote health and wellness. What does preventive care include?  A yearly physical exam. This is also called an annual well check.  Dental exams once or twice a year.  Routine eye exams. Ask your health care provider how often you should have your eyes checked.  Personal lifestyle choices, including:  Daily care of your teeth and gums.  Regular physical activity.  Eating a healthy diet.  Avoiding tobacco and drug use.  Limiting alcohol use.  Practicing safe sex.  Taking low-dose aspirin every day.  Taking vitamin and mineral supplements as recommended by your health care provider. What happens during an annual well check? The services and screenings done by your health care provider during your annual well check will depend on your age, overall health, lifestyle risk factors, and family history of disease. Counseling  Your  health care provider may ask you questions about your:  Alcohol use.  Tobacco use.  Drug use.  Emotional well-being.  Home and relationship well-being.  Sexual activity.  Eating habits.  History of falls.  Memory and ability to understand (cognition).  Work and work Statistician.  Reproductive health. Screening  You may have the following tests or measurements:  Height, weight, and BMI.  Blood pressure.  Lipid and cholesterol levels. These may be checked every 5 years, or more frequently if you are over 45 years old.  Skin check.  Lung cancer screening. You may have this screening every year starting at age 22 if you have a 30-pack-year history of smoking and currently smoke or have quit within the past 15 years.  Fecal occult blood test (FOBT) of the stool. You may have this test every year starting at age 27.  Flexible sigmoidoscopy or colonoscopy. You may have a sigmoidoscopy every 5 years or a colonoscopy every 10 years starting at age 53.  Hepatitis C blood test.  Hepatitis B blood test.  Sexually transmitted disease (STD) testing.  Diabetes screening. This is done by checking your blood sugar (glucose) after you have not eaten for a while (fasting). You may have this done every 1-3 years.  Bone density scan. This is done to screen for osteoporosis. You may have this done starting at age 15.  Mammogram. This may be done every 1-2 years. Talk to your health care provider about how often you should have regular mammograms. Talk with your health care provider about your test results, treatment options, and if necessary, the need for more tests. Vaccines  Your health care provider may recommend certain vaccines, such as:  Influenza vaccine. This is recommended every year.  Tetanus, diphtheria, and acellular pertussis (Tdap, Td) vaccine. You may need a Td booster every 10 years.  Zoster vaccine. You may need this after age 43.  Pneumococcal 13-valent  conjugate (PCV13) vaccine. One dose is recommended after age 68.  Pneumococcal polysaccharide (PPSV23) vaccine. One dose is recommended after age 17. Talk to your health care provider about which screenings and vaccines you need and how often you need them. This information is not intended to replace advice given to you by your health care provider. Make sure you discuss any questions you have with your health care provider. Document Released: 09/23/2015 Document Revised: 05/16/2016 Document Reviewed: 06/28/2015 Elsevier Interactive Patient Education  2017 [name] Prevention in the Home Falls can cause injuries. They can happen to people of all ages. There are many things you can do to make your home safe and to help prevent falls. What can I do on the outside of my home?  Regularly fix the edges of walkways and driveways and fix any cracks.  Remove anything that might make you trip as you walk through a door, such as a raised step or threshold.  Trim any bushes or trees on the path to your home.  Use bright outdoor lighting.  Clear any walking paths of anything that might make someone trip, such as rocks or tools.  Regularly check to see if handrails are loose or broken. Make sure that both sides of any steps have handrails.  Any raised decks and porches should have guardrails on the edges.  Have any leaves, snow, or ice cleared regularly.  Use sand or salt on walking paths during winter.  Clean up any spills in your garage right away. This includes oil or grease spills. What can I do in the bathroom?  Use night lights.  Install grab bars by the toilet and in the tub and shower. Do not use towel bars as grab bars.  Use non-skid mats or decals in the tub or shower.  If you need to sit down in the shower, use a plastic, non-slip stool.  Keep the floor dry. Clean up any water that spills on the floor as soon as it happens.  Remove soap buildup in the tub or  shower regularly.  Attach bath mats securely with double-sided non-slip rug tape.  Do not have throw rugs and other things on the floor that can make you trip. What can I do in the bedroom?  Use night lights.  Make sure that you have a light by your bed that is easy to reach.  Do not use any sheets or blankets that are too big for your bed. They should not hang down onto the floor.  Have a firm chair that has side arms. You can use this for support while you get dressed.  Do not have throw rugs and other things on the floor that can make you trip. What can I do in the kitchen?  Clean up any spills right away.  Avoid walking on wet floors.  Keep items that you use a lot in easy-to-reach places.  If you need to reach something above you, use a strong step stool that has a grab bar.  Keep electrical cords out of the way.  Do not use floor polish or wax that makes floors slippery. If you must use wax, use non-skid floor wax.  Do not have throw rugs and other things on the floor that can make you trip. What can I do  with my stairs?  Do not leave any items on the stairs.  Make sure that there are handrails on both sides of the stairs and use them. Fix handrails that are broken or loose. Make sure that handrails are as long as the stairways.  Check any carpeting to make sure that it is firmly attached to the stairs. Fix any carpet that is loose or worn.  Avoid having throw rugs at the top or bottom of the stairs. If you do have throw rugs, attach them to the floor with carpet tape.  Make sure that you have a light switch at the top of the stairs and the bottom of the stairs. If you do not have them, ask someone to add them for you. What else can I do to help prevent falls?  Wear shoes that:  Do not have high heels.  Have rubber bottoms.  Are comfortable and fit you well.  Are closed at the toe. Do not wear sandals.  If you use a stepladder:  Make sure that it is fully  opened. Do not climb a closed stepladder.  Make sure that both sides of the stepladder are locked into place.  Ask someone to hold it for you, if possible.  Clearly mark and make sure that you can see:  Any grab bars or handrails.  First and last steps.  Where the edge of each step is.  Use tools that help you move around (mobility aids) if they are needed. These include:  Canes.  Walkers.  Scooters.  Crutches.  Turn on the lights when you go into a dark area. Replace any light bulbs as soon as they burn out.  Set up your furniture so you have a clear path. Avoid moving your furniture around.  If any of your floors are uneven, fix them.  If there are any pets around you, be aware of where they are.  Review your medicines with your doctor. Some medicines can make you feel dizzy. This can increase your chance of falling. Ask your doctor what other things that you can do to help prevent falls. This information is not intended to replace advice given to you by your health care provider. Make sure you discuss any questions you have with your health care provider. Document Released: 06/23/2009 Document Revised: 02/02/2016 Document Reviewed: 10/01/2014 Elsevier Interactive Patient Education  2017 [employer].

## 2021-02-14 ENCOUNTER — Telehealth: Payer: Self-pay | Admitting: Cardiology

## 2021-02-14 NOTE — Telephone Encounter (Signed)
Spoke with patient about her [employer] KardiaMobile device. She is having difficulty loading her ECG report to MyChart. Spent 30 minutes on the phone attempting to troubleshoot how to accomplish this. The conclusion was that patient may need to screen shot her ECG and attach this way to a MyChart message, unless she can email the PDF to a computer, save and upload to MyChart there. She will attempt this method later today.

## 2021-02-14 NOTE — Telephone Encounter (Signed)
Patient called in to say that she now has Kardia mobile 6L, since she was allergic to the AppleWatch. She doesn't know how to connect the Kardia to Mychart, whats to know if there another way of getting results. Please advise

## 2021-02-20 NOTE — Telephone Encounter (Signed)
This RN called patient, this RN wanted to clarify Dr. Rosezella [place] message to ensure patient understanding. This RN let patient know the meaning of syncope to ensure patient had not had any symptoms of syncope with her bradycardia episode referenced by Dr. Percival Spanish. Pt denied syncope or near-syncope. This RN told patient to call 911 or have someone bring her to the emergency room should she have feelings of chest pain, syncope, or any other troubling symptoms. Patient verbalized understanding and thanks.

## 2021-02-24 DIAGNOSIS — H2513 Age-related nuclear cataract, bilateral: Secondary | ICD-10-CM | POA: Diagnosis not present

## 2021-02-24 DIAGNOSIS — H04123 Dry eye syndrome of bilateral lacrimal glands: Secondary | ICD-10-CM | POA: Diagnosis not present

## 2021-02-24 DIAGNOSIS — H401131 Primary open-angle glaucoma, bilateral, mild stage: Secondary | ICD-10-CM | POA: Diagnosis not present

## 2021-03-02 DIAGNOSIS — L821 Other seborrheic keratosis: Secondary | ICD-10-CM | POA: Diagnosis not present

## 2021-03-02 DIAGNOSIS — L718 Other rosacea: Secondary | ICD-10-CM | POA: Diagnosis not present

## 2021-03-02 DIAGNOSIS — L304 Erythema intertrigo: Secondary | ICD-10-CM | POA: Diagnosis not present

## 2021-03-02 DIAGNOSIS — L814 Other melanin hyperpigmentation: Secondary | ICD-10-CM | POA: Diagnosis not present

## 2021-03-02 DIAGNOSIS — D1724 Benign lipomatous neoplasm of skin and subcutaneous tissue of left leg: Secondary | ICD-10-CM | POA: Diagnosis not present

## 2021-03-02 DIAGNOSIS — L820 Inflamed seborrheic keratosis: Secondary | ICD-10-CM | POA: Diagnosis not present

## 2021-03-07 ENCOUNTER — Ambulatory Visit: Payer: Medicare Other | Admitting: Family Medicine

## 2021-03-14 ENCOUNTER — Ambulatory Visit (INDEPENDENT_AMBULATORY_CARE_PROVIDER_SITE_OTHER): Payer: Medicare Other | Admitting: Family Medicine

## 2021-03-14 ENCOUNTER — Other Ambulatory Visit: Payer: Self-pay

## 2021-03-14 ENCOUNTER — Encounter: Payer: Self-pay | Admitting: Family Medicine

## 2021-03-14 VITALS — BP 138/88 | HR 56 | Temp 98.7°F | Resp 18 | Ht 61.0 in | Wt 154.8 lb

## 2021-03-14 DIAGNOSIS — Z9189 Other specified personal risk factors, not elsewhere classified: Secondary | ICD-10-CM | POA: Diagnosis not present

## 2021-03-14 DIAGNOSIS — I10 Essential (primary) hypertension: Secondary | ICD-10-CM

## 2021-03-14 DIAGNOSIS — R911 Solitary pulmonary nodule: Secondary | ICD-10-CM

## 2021-03-14 MED ORDER — AMLODIPINE BESYLATE 2.5 MG PO TABS: ORAL_TABLET | ORAL | 1 refills | Status: DC

## 2021-03-14 NOTE — Patient Instructions (Signed)
https://www.nhlbi.nih.gov/files/docs/public/heart/dash_brief.pdf">  DASH Eating Plan DASH stands for Dietary Approaches to Stop Hypertension. The DASH eating plan is a healthy eating plan that has been shown to: Reduce high blood pressure (hypertension). Reduce your risk for type 2 diabetes, heart disease, and stroke. Help with weight loss. What are tips for following this plan? Reading food labels Check food labels for the amount of salt (sodium) per serving. Choose foods with less than 5 percent of the Daily Value of sodium. Generally, foods with less than 300 milligrams (mg) of sodium per serving fit into this eating plan. To find whole grains, look for the word "whole" as the first word in the ingredient list. Shopping Buy products labeled as "low-sodium" or "no salt added." Buy fresh foods. Avoid canned foods and pre-made or frozen meals. Cooking Avoid adding salt when cooking. Use salt-free seasonings or herbs instead of table salt or sea salt. Check with your health care provider or pharmacist before using salt substitutes. Do not fry foods. Cook foods using healthy methods such as baking, boiling, grilling, roasting, and broiling instead. Cook with heart-healthy oils, such as olive, canola, avocado, soybean, or sunflower oil. Meal planning  Eat a balanced diet that includes: 4 or more servings of fruits and 4 or more servings of vegetables each day. Try to fill one-half of your plate with fruits and vegetables. 6-8 servings of whole grains each day. Less than 6 oz (170 g) of lean meat, poultry, or fish each day. A 3-oz (85-g) serving of meat is about the same size as a deck of cards. One egg equals 1 oz (28 g). 2-3 servings of low-fat dairy each day. One serving is 1 cup (237 mL). 1 serving of nuts, seeds, or beans 5 times each week. 2-3 servings of heart-healthy fats. Healthy fats called omega-3 fatty acids are found in foods such as walnuts, flaxseeds, fortified milks, and eggs.  These fats are also found in cold-water fish, such as sardines, salmon, and mackerel. Limit how much you eat of: Canned or prepackaged foods. Food that is high in trans fat, such as some fried foods. Food that is high in saturated fat, such as fatty meat. Desserts and other sweets, sugary drinks, and other foods with added sugar. Full-fat dairy products. Do not salt foods before eating. Do not eat more than 4 egg yolks a week. Try to eat at least 2 vegetarian meals a week. Eat more home-cooked food and less restaurant, buffet, and fast food.  Lifestyle When eating at a restaurant, ask that your food be prepared with less salt or no salt, if possible. If you drink alcohol: Limit how much you use to: 0-1 drink a day for women who are not pregnant. 0-2 drinks a day for men. Be aware of how much alcohol is in your drink. In the [place] one drink equals one 12 oz bottle of beer (355 mL), one 5 oz glass of wine (148 mL), or one 1 oz glass of hard liquor (44 mL). General information Avoid eating more than 2,300 mg of salt a day. If you have hypertension, you may need to reduce your sodium intake to 1,500 mg a day. Work with your health care provider to maintain a healthy body weight or to lose weight. Ask what an ideal weight is for you. Get at least 30 minutes of exercise that causes your heart to beat faster (aerobic exercise) most days of the week. Activities may include walking, swimming, or biking. Work with your health care provider   or dietitian to adjust your eating plan to your individual calorie needs. What foods should I eat? Fruits All fresh, dried, or frozen fruit. Canned fruit in natural juice (without addedsugar). Vegetables Fresh or frozen vegetables (raw, steamed, roasted, or grilled). Low-sodium or reduced-sodium tomato and vegetable juice. Low-sodium or reduced-sodium tomatosauce and tomato paste. Low-sodium or reduced-sodium canned vegetables. Grains Whole-grain or  whole-wheat bread. Whole-grain or whole-wheat pasta. Brown rice. Oatmeal. Quinoa. Bulgur. Whole-grain and low-sodium cereals. Pita bread.Low-fat, low-sodium crackers. Whole-wheat flour tortillas. Meats and other proteins Skinless chicken or turkey. Ground chicken or turkey. Pork with fat trimmed off. Fish and seafood. Egg whites. Dried beans, peas, or lentils. Unsalted nuts, nut butters, and seeds. Unsalted canned beans. Lean cuts of beef with fat trimmed off. Low-sodium, lean precooked or cured meat, such as sausages or meatloaves. Dairy Low-fat (1%) or fat-free (skim) milk. Reduced-fat, low-fat, or fat-free cheeses. Nonfat, low-sodium ricotta or cottage cheese. Low-fat or nonfatyogurt. Low-fat, low-sodium cheese. Fats and oils Soft margarine without trans fats. Vegetable oil. Reduced-fat, low-fat, or light mayonnaise and salad dressings (reduced-sodium). Canola, safflower, olive, avocado, soybean, andsunflower oils. Avocado. Seasonings and condiments Herbs. Spices. Seasoning mixes without salt. Other foods Unsalted popcorn and pretzels. Fat-free sweets. The items listed above may not be a complete list of foods and beverages you can eat. Contact a dietitian for more information. What foods should I avoid? Fruits Canned fruit in a light or heavy syrup. Fried fruit. Fruit in cream or buttersauce. Vegetables Creamed or fried vegetables. Vegetables in a cheese sauce. Regular canned vegetables (not low-sodium or reduced-sodium). Regular canned tomato sauce and paste (not low-sodium or reduced-sodium). Regular tomato and vegetable juice(not low-sodium or reduced-sodium). Pickles. Olives. Grains Baked goods made with fat, such as croissants, muffins, or some breads. Drypasta or rice meal packs. Meats and other proteins Fatty cuts of meat. Ribs. Fried meat. Bacon. Bologna, salami, and other precooked or cured meats, such as sausages or meat loaves. Fat from the back of a pig (fatback). Bratwurst.  Salted nuts and seeds. Canned beans with added salt. Canned orsmoked fish. Whole eggs or egg yolks. Chicken or turkey with skin. Dairy Whole or 2% milk, cream, and half-and-half. Whole or full-fat cream cheese. Whole-fat or sweetened yogurt. Full-fat cheese. Nondairy creamers. Whippedtoppings. Processed cheese and cheese spreads. Fats and oils Butter. Stick margarine. Lard. Shortening. Ghee. Bacon fat. Tropical oils, suchas coconut, palm kernel, or palm oil. Seasonings and condiments Onion salt, garlic salt, seasoned salt, table salt, and sea salt. Worcestershire sauce. Tartar sauce. Barbecue sauce. Teriyaki sauce. Soy sauce, including reduced-sodium. Steak sauce. Canned and packaged gravies. Fish sauce. Oyster sauce. Cocktail sauce. Store-bought horseradish. Ketchup. Mustard. Meat flavorings and tenderizers. Bouillon cubes. Hot sauces. Pre-made or packaged marinades. Pre-made or packaged taco seasonings. Relishes. Regular saladdressings. Other foods Salted popcorn and pretzels. The items listed above may not be a complete list of foods and beverages you should avoid. Contact a dietitian for more information. Where to find more information National Heart, Lung, and Blood Institute: www.nhlbi.nih.gov American Heart Association: www.heart.org Academy of Nutrition and Dietetics: www.eatright.org National Kidney Foundation: www.kidney.org Summary The DASH eating plan is a healthy eating plan that has been shown to reduce high blood pressure (hypertension). It may also reduce your risk for type 2 diabetes, heart disease, and stroke. When on the DASH eating plan, aim to eat more fresh fruits and vegetables, whole grains, lean proteins, low-fat dairy, and heart-healthy fats. With the DASH eating plan, you should limit salt (sodium) intake to 2,300   mg a day. If you have hypertension, you may need to reduce your sodium intake to 1,500 mg a day. Work with your health care provider or dietitian to adjust  your eating plan to your individual calorie needs. This information is not intended to replace advice given to you by your health care provider. Make sure you discuss any questions you have with your healthcare provider. Document Revised: 07/31/2019 Document Reviewed: 07/31/2019 Elsevier Patient Education  2022 Elsevier Inc.  

## 2021-03-14 NOTE — Progress Notes (Signed)
Established Patient Office Visit  Subjective:  Patient ID: Kayla [name], female    DOB: 1945-11-22  Age: 75 y.o. MRN: [id number]  CC:  Chief Complaint  Patient presents with   Hypertension   Follow-up    HPI [name] presents for f/u bp and labs .  She also would like me to look at her chest CT done in ER--- she has questions about nodules seen--- they are micronodules   Past Medical History:  Diagnosis Date   Abscess of left groin 11/10/2014   Asthma    Cellulitis 10/20/2014   Diverticulitis    Diverticulosis    Fasting hyperglycemia 04/2011   FBS 108   Fibromyalgia    Fundic gland polyps of stomach, benign    GERD (gastroesophageal reflux disease)    gastric polyp x3   Glaucoma     Dr Bing Plume   IBS (irritable bowel syndrome)    Osteopenia    last 07/2011   Pneumonia     OP as child   Shingles     Past Surgical History:  Procedure Laterality Date   COLONOSCOPY W/ POLYPECTOMY  1993,2002,2007   last  colonoscopy 2007, Dr Carlean Purl   ESOPHAGOGASTRODUODENOSCOPY     SPINE SURGERY     T9-L5 fusions    Family History  Problem Relation Age of Onset   Breast cancer Mother    Breast cancer Sister    Breast cancer Maternal Aunt        two   Breast cancer Paternal Aunt    Asthma Paternal Aunt    Heart failure Father        CHF, CABG late 10s   Kidney disease Maternal Grandmother    Ovarian cancer Other        Niece x2   Breast cancer Other        niece   Coronary artery disease Paternal Aunt        triple CABG   Heart attack Paternal Grandmother        MI in late 62s   Heart disease Paternal Grandfather    Heart attack Paternal Uncle         MI in 45s   Heart disease Maternal Aunt    Pulmonary embolism Son    Deep vein thrombosis Son    Heart Problems Son        Heart attacks related to a "blood disorder"    Diabetes Neg Hx    Stroke Neg Hx    COPD Neg Hx    Colon polyps Neg Hx     Social History   Socioeconomic History   Marital status:  Married    Spouse name: Not on file   Number of children: Not on file   Years of education: Not on file   Highest education level: Not on file  Occupational History   Occupation: retired  Tobacco Use   Smoking status: Former    Packs/day: 0.50    Years: 10.00    Pack years: 5.00    Types: Cigarettes    Quit date: 09/10/1974    Years since quitting: 46.5   Smokeless tobacco: Never   Tobacco comments:    smoked 1966- 1976, up to 1 ppd  Substance and Sexual Activity   Alcohol use: No    Alcohol/week: 0.0 standard drinks   Drug use: No   Sexual activity: Yes  Other Topics Concern   Not on file  Social History Narrative  Daily caffeine 3 cups   Regular exercise   Married         Social Determinants of Health   Financial Resource Strain: Low Risk    Difficulty of Paying Living Expenses: Not hard at all  Food Insecurity: No Food Insecurity   Worried About Charity fundraiser in the Last Year: Never true   Arboriculturist in the Last Year: Never true  Transportation Needs: No Transportation Needs   Lack of Transportation (Medical): No   Lack of Transportation (Non-Medical): No  Physical Activity: Inactive   Days of Exercise per Week: 0 days   Minutes of Exercise per Session: 0 min  Stress: No Stress Concern Present   Feeling of Stress : Not at all  Social Connections: Socially Isolated   Frequency of Communication with Friends and Family: More than three times a week   Frequency of Social Gatherings with Friends and Family: More than three times a week   Attends Religious Services: Never   Marine scientist or Organizations: No   Attends Music therapist: Never   Marital Status: Divorced  Human resources officer Violence: Not At Risk   Fear of Current or Ex-Partner: No   Emotionally Abused: No   Physically Abused: No   Sexually Abused: No    Outpatient Medications Prior to Visit  Medication Sig Dispense Refill   acetaminophen (TYLENOL) 500 MG tablet  Take 500 mg by mouth as needed.     albuterol (PROVENTIL) (2.5 MG/3ML) 0.083% nebulizer solution Take 3 mLs (2.5 mg total) by nebulization every 6 (six) hours as needed for wheezing or shortness of breath. 75 mL 12   albuterol (VENTOLIN HFA) 108 (90 Base) MCG/ACT inhaler Inhale 2 puffs into the lungs every 6 (six) hours as needed for wheezing or shortness of breath. As needed only 18 g 3   bifidobacterium infantis (ALIGN) capsule Take 1 capsule by mouth daily. 21 capsule 0   carboxymethylcellulose (REFRESH PLUS) 0.5 % SOLN 1 drop 3 (three) times daily as needed.     cholecalciferol (VITAMIN D) 1000 units tablet Take 2,000 Units by mouth daily.     COVID-19 mRNA vaccine, Pfizer, 30 MCG/0.3ML injection INJECT AS DIRECTED .3 mL 0   EPINEPHrine (EPIPEN 2-PAK) 0.3 mg/0.3 mL IJ SOAJ injection As directed 1 each 0   famotidine (PEPCID) 10 MG tablet Take 10 mg by mouth at bedtime. Alternating with 20mg  tablets she had     fluticasone (FLONASE) 50 MCG/ACT nasal spray USE 2 SPRAYS IN EACH  NOSTRIL ONCE A DAY 48 g 3   hyoscyamine (LEVSIN SL) 0.125 MG SL tablet Place 1 tablet (0.125 mg total) under the tongue every 6 (six) hours as needed. 10 tablet 1   latanoprost (XALATAN) 0.005 % ophthalmic solution Place 1 drop into both eyes at bedtime.     montelukast (SINGULAIR) 10 MG tablet Take 1 tablet (10 mg total) by mouth at bedtime. 90 tablet 3   nystatin cream (MYCOSTATIN) Apply 1 application topically 2 (two) times daily. 90 g 3   Omega-3 Fatty Acids (FISH OIL PO) Take by mouth.     Polyethyl Glycol-Propyl Glycol (SYSTANE) 0.4-0.3 % GEL ophthalmic gel Place 1 application into both eyes.     sodium chloride (OCEAN) 0.65 % SOLN nasal spray Place 1 spray into both nostrils as needed for congestion.     triamcinolone (KENALOG) 0.1 % Apply 1 application topically 2 (two) times daily. 90 g 3   Vitamin Mixture (  ESTER-C PO) Take 1,000 mg by mouth daily.     amLODipine (NORVASC) 2.5 MG tablet TAKE 1 TABLET(2.5 MG) BY  MOUTH DAILY 90 tablet 1   metoprolol tartrate (LOPRESSOR) 25 MG tablet Take 1 tablet (25 mg total) by mouth 2 (two) times daily. May take 1 additional tablet daily as needed for palpitations. 270 tablet 3   No facility-administered medications prior to visit.    Allergies  Allergen Reactions   Cephalosporins     Blisters  orally   Gabapentin     REACTION: rash   Levofloxacin     REACTION: stomach ache and rash   Psyllium     REACTION: rash   Sulfonamide Derivatives Anaphylaxis    REACTION: shock, urticaria   Aspirin     REACTION: stomach pain   Belladonna    Ciprofloxacin Hcl    Conjugated Estrogens    Doxycycline     Abdominal pain   Flovent Hfa [Fluticasone]    Nabumetone    Nsaids    Soybean-Containing Drug Products Other (See Comments)    GI upset   Zicam Cold Remedy [Homeopathic Products]    Zocor [Simvastatin] Itching   Nickel Itching and Rash   Prednisone Rash    NO PROBLEM WITH MEDROL DOSE PAK Oral prednisone caused facial burning, "made my face feel like it was on fire"    ROS Review of Systems  Constitutional:  Negative for appetite change, diaphoresis, fatigue and unexpected weight change.  Eyes:  Negative for pain, redness and visual disturbance.  Respiratory:  Negative for cough, chest tightness, shortness of breath and wheezing.   Cardiovascular:  Negative for chest pain, palpitations and leg swelling.  Endocrine: Negative for cold intolerance, heat intolerance, polydipsia, polyphagia and polyuria.  Genitourinary:  Negative for difficulty urinating, dysuria and frequency.  Neurological:  Negative for dizziness, light-headedness, numbness and headaches.     Objective:    Physical Exam Vitals and nursing note reviewed.  Constitutional:      Appearance: She is well-developed.  HENT:     Head: Normocephalic and atraumatic.  Eyes:     Conjunctiva/sclera: Conjunctivae normal.  Neck:     Thyroid: No thyromegaly.     Vascular: No carotid bruit or  JVD.  Cardiovascular:     Rate and Rhythm: Normal rate and regular rhythm.     Heart sounds: Normal heart sounds. No murmur heard. Pulmonary:     Effort: Pulmonary effort is normal. No respiratory distress.     Breath sounds: Normal breath sounds. No wheezing or rales.  Chest:     Chest wall: No tenderness.  Musculoskeletal:     Cervical back: Normal range of motion and neck supple.  Neurological:     Mental Status: She is alert and oriented to person, place, and time.  Psychiatric:        Mood and Affect: Mood normal.        Thought Content: Thought content normal.    BP 138/88 (BP Location: Right Arm, Patient Position: Sitting, Cuff Size: Normal)   Pulse (!) 56   Temp 98.7 F (37.1 C) (Oral)   Resp 18   Ht 5\' 1"  (1.549 m)   Wt 154 lb 12.8 oz (70.2 kg)   SpO2 98%   BMI 29.25 kg/m  Wt Readings from Last 3 Encounters:  03/14/21 154 lb 12.8 oz (70.2 kg)  02/02/21 156 lb (70.8 kg)  09/01/20 153 lb 12.8 oz (69.8 kg)   Narrative & Impression  CLINICAL DATA:  Palpitations and shortness of breath. Positive D-dimer.   EXAM: CT ANGIOGRAPHY CHEST WITH CONTRAST   TECHNIQUE: Multidetector CT imaging of the chest was performed using the standard protocol during bolus administration of intravenous contrast. Multiplanar CT image reconstructions and MIPs were obtained to evaluate the vascular anatomy.   CONTRAST:  154mL OMNIPAQUE IOHEXOL 350 MG/ML SOLN   COMPARISON:  CT heart 12/16/2009   FINDINGS: Cardiovascular:   Satisfactory opacification of the pulmonary arteries to the segmental level. No evidence of pulmonary embolism. The main pulmonary artery is normal in caliber.   Normal heart size. No pericardial effusion. The thoracic aorta is normal in caliber. No atherosclerotic plaque. No coronary artery calcifications.   Mediastinum/Nodes: No enlarged mediastinal, hilar, or axillary lymph nodes. Thyroid gland, trachea, and esophagus demonstrate no significant  findings.   Lungs/Pleura: Several scattered pulmonary micronodules. No pulmonary mass. No focal consolidation. No pleural effusion or pneumothorax.   Upper Abdomen: No acute abnormality.   Musculoskeletal:   No abdominal wall hernia or abnormality   Diffusely decreased bone density. Partially visualized thoracolumbar posterior fusion in the setting of lower thoracic levoscoliosis. No suspicious lytic or blastic osseous lesions. No acute displaced fracture. Multilevel degenerative changes of the spine.   Review of the MIP images confirms the above findings.   IMPRESSION: 1. No pulmonary embolus. 2. No acute intrathoracic abnormality.     Electronically Signed   By: Iven Finn M.D.   On: 08/02/2020 20:33      Health Maintenance Due  Topic Date Due   COVID-19 Vaccine (4 - Booster for Pfizer series) 02/08/2021    There are no preventive care reminders to display for this patient.  Lab Results  Component Value Date   TSH 2.640 08/25/2020   Lab Results  Component Value Date   WBC 4.2 08/02/2020   HGB 13.0 08/02/2020   HCT 38.9 08/02/2020   MCV 93.1 08/02/2020   PLT 163 08/02/2020   Lab Results  Component Value Date   NA 137 10/03/2020   K 5.2 10/03/2020   CO2 24 10/03/2020   GLUCOSE 92 10/03/2020   BUN 15 10/03/2020   CREATININE 0.67 10/03/2020   BILITOT 0.8 08/02/2020   ALKPHOS 70 08/02/2020   AST 26 08/02/2020   ALT 18 08/02/2020   PROT 6.8 08/02/2020   ALBUMIN 4.2 08/02/2020   CALCIUM 9.7 10/03/2020   ANIONGAP 8 08/02/2020   GFR 82.09 08/01/2020   Lab Results  Component Value Date   CHOL 210 (H) 06/15/2020   Lab Results  Component Value Date   HDL 65 06/15/2020   Lab Results  Component Value Date   LDLCALC 125 (H) 06/15/2020   Lab Results  Component Value Date   TRIG 94 06/15/2020   Lab Results  Component Value Date   CHOLHDL 3.2 06/15/2020   Lab Results  Component Value Date   HGBA1C 5.4 11/01/2011      Assessment & Plan:    Problem List Items Addressed This Visit       Unprioritized   Essential hypertension - Primary    Well controlled, no changes to meds. Encouraged heart healthy diet such as the DASH diet and exercise as tolerated.         Relevant Medications   amLODipine (NORVASC) 2.5 MG tablet   Other Relevant Orders   Lipid panel   Comprehensive metabolic panel   Pulmonary nodule less than 1 cm in diameter with low risk for malignant neoplasm    micronodules --- low  risk D/w pt         Meds ordered this encounter  Medications   amLODipine (NORVASC) 2.5 MG tablet    Sig: TAKE 1 TABLET(2.5 MG) BY MOUTH DAILY    Dispense:  90 tablet    Refill:  1    Follow-up: Return in about 6 months (around 09/14/2021), or if symptoms worsen or fail to improve, for annual exam, fasting.    [name], DO

## 2021-03-14 NOTE — Assessment & Plan Note (Signed)
Well controlled, no changes to meds. Encouraged heart healthy diet such as the DASH diet and exercise as tolerated.  °

## 2021-03-14 NOTE — Assessment & Plan Note (Signed)
micronodules --- low risk D/w pt

## 2021-03-15 LAB — COMPREHENSIVE METABOLIC PANEL
ALT: 13 U/L (ref 0–35)
AST: 20 U/L (ref 0–37)
Albumin: 4.4 g/dL (ref 3.5–5.2)
Alkaline Phosphatase: 87 U/L (ref 39–117)
BUN: 16 mg/dL (ref 6–23)
CO2: 28 mEq/L (ref 19–32)
Calcium: 9.8 mg/dL (ref 8.4–10.5)
Chloride: 101 mEq/L (ref 96–112)
Creatinine, Ser: 0.69 mg/dL (ref 0.40–1.20)
GFR: 84.84 mL/min (ref >60.00)
Glucose, Bld: 88 mg/dL (ref 70–99)
Potassium: 4.3 mEq/L (ref 3.5–5.1)
Sodium: 138 mEq/L (ref 135–145)
Total Bilirubin: 0.5 mg/dL (ref 0.2–1.2)
Total Protein: 6.8 g/dL (ref 6.0–8.3)

## 2021-03-15 LAB — LIPID PANEL
Cholesterol: 220 mg/dL — ABNORMAL HIGH (ref 0–200)
HDL: 68.5 mg/dL (ref >39.00)
LDL Cholesterol: 135 mg/dL — ABNORMAL HIGH (ref 0–99)
NonHDL: 151.6
Total CHOL/HDL Ratio: 3
Triglycerides: 82 mg/dL (ref 0.0–149.0)
VLDL: 16.4 mg/dL (ref 0.0–40.0)

## 2021-03-17 ENCOUNTER — Other Ambulatory Visit: Payer: Self-pay | Admitting: Family Medicine

## 2021-03-17 DIAGNOSIS — E785 Hyperlipidemia, unspecified: Secondary | ICD-10-CM

## 2021-03-17 MED ORDER — ROSUVASTATIN CALCIUM 10 MG PO TABS: 10.0000 mg | ORAL_TABLET | Freq: Every day | ORAL | 2 refills | Status: DC

## 2021-03-17 NOTE — Addendum Note (Signed)
Addended by: Jeronimo Greaves on: 03/17/2021 02:06 PM   Modules accepted: Orders

## 2021-04-13 ENCOUNTER — Other Ambulatory Visit: Payer: Self-pay

## 2021-04-13 DIAGNOSIS — J4520 Mild intermittent asthma, uncomplicated: Secondary | ICD-10-CM

## 2021-04-13 MED ORDER — ALBUTEROL SULFATE HFA 108 (90 BASE) MCG/ACT IN AERS: 2.0000 | INHALATION_SPRAY | Freq: Four times a day (QID) | RESPIRATORY_TRACT | 3 refills | Status: DC | PRN

## 2021-06-01 ENCOUNTER — Other Ambulatory Visit: Payer: Self-pay | Admitting: Family Medicine

## 2021-06-01 DIAGNOSIS — I10 Essential (primary) hypertension: Secondary | ICD-10-CM

## 2021-07-07 ENCOUNTER — Encounter: Payer: Self-pay | Admitting: Family Medicine

## 2021-07-07 DIAGNOSIS — M810 Age-related osteoporosis without current pathological fracture: Secondary | ICD-10-CM | POA: Diagnosis not present

## 2021-07-07 DIAGNOSIS — Z1231 Encounter for screening mammogram for malignant neoplasm of breast: Secondary | ICD-10-CM | POA: Diagnosis not present

## 2021-07-07 DIAGNOSIS — M85851 Other specified disorders of bone density and structure, right thigh: Secondary | ICD-10-CM | POA: Diagnosis not present

## 2021-07-07 LAB — HM MAMMOGRAPHY

## 2021-07-12 ENCOUNTER — Other Ambulatory Visit: Payer: Self-pay

## 2021-07-12 ENCOUNTER — Telehealth: Payer: Self-pay

## 2021-07-12 DIAGNOSIS — E559 Vitamin D deficiency, unspecified: Secondary | ICD-10-CM

## 2021-07-12 NOTE — Telephone Encounter (Signed)
Spoke with patient. Pt made aware of results. Pt states insurance may be changing on 2023 and she will look into Prolia and Evenity but states since scan has improved she will continue supplements and exercise.

## 2021-07-17 ENCOUNTER — Other Ambulatory Visit (INDEPENDENT_AMBULATORY_CARE_PROVIDER_SITE_OTHER): Payer: Medicare Other

## 2021-07-17 ENCOUNTER — Other Ambulatory Visit: Payer: Self-pay

## 2021-07-17 DIAGNOSIS — E559 Vitamin D deficiency, unspecified: Secondary | ICD-10-CM

## 2021-07-17 DIAGNOSIS — E785 Hyperlipidemia, unspecified: Secondary | ICD-10-CM | POA: Diagnosis not present

## 2021-07-17 LAB — COMPREHENSIVE METABOLIC PANEL
ALT: 12 U/L (ref 0–35)
AST: 19 U/L (ref 0–37)
Albumin: 4.2 g/dL (ref 3.5–5.2)
Alkaline Phosphatase: 91 U/L (ref 39–117)
BUN: 15 mg/dL (ref 6–23)
CO2: 30 mEq/L (ref 19–32)
Calcium: 9.3 mg/dL (ref 8.4–10.5)
Chloride: 103 mEq/L (ref 96–112)
Creatinine, Ser: 0.69 mg/dL (ref 0.40–1.20)
GFR: 84.63 mL/min (ref >60.00)
Glucose, Bld: 78 mg/dL (ref 70–99)
Potassium: 4.4 mEq/L (ref 3.5–5.1)
Sodium: 138 mEq/L (ref 135–145)
Total Bilirubin: 0.6 mg/dL (ref 0.2–1.2)
Total Protein: 6.4 g/dL (ref 6.0–8.3)

## 2021-07-17 LAB — LIPID PANEL
Cholesterol: 209 mg/dL — ABNORMAL HIGH (ref 0–200)
HDL: 64 mg/dL (ref >39.00)
LDL Cholesterol: 129 mg/dL — ABNORMAL HIGH (ref 0–99)
NonHDL: 144.56
Total CHOL/HDL Ratio: 3
Triglycerides: 78 mg/dL (ref 0.0–149.0)
VLDL: 15.6 mg/dL (ref 0.0–40.0)

## 2021-07-17 LAB — VITAMIN D 25 HYDROXY (VIT D DEFICIENCY, FRACTURES): VITD: 35.01 ng/mL (ref 30.00–100.00)

## 2021-07-19 ENCOUNTER — Other Ambulatory Visit: Payer: Self-pay | Admitting: Family Medicine

## 2021-07-19 DIAGNOSIS — E785 Hyperlipidemia, unspecified: Secondary | ICD-10-CM

## 2021-07-21 ENCOUNTER — Telehealth: Payer: Self-pay | Admitting: Internal Medicine

## 2021-07-21 NOTE — Telephone Encounter (Signed)
Inbound call from pt requesting a call back stating that she is having some abd pain. Please advise. Thank you.

## 2021-07-21 NOTE — Telephone Encounter (Signed)
Pt stated that she did experience epigastric pain after having fish oil tablets along with eating an apple. Pt states that she took 1 Omeprazole that she had had but does not normally take. Pt states that the symptoms have resolved and that she is feeling much better. Pt notified that if the symptoms come back or worsen to please give [place] a call back. Pt verbalized understanding with all questions answered.

## 2021-08-28 ENCOUNTER — Other Ambulatory Visit: Payer: Self-pay | Admitting: Family Medicine

## 2021-08-28 DIAGNOSIS — I10 Essential (primary) hypertension: Secondary | ICD-10-CM

## 2021-08-31 DIAGNOSIS — H2513 Age-related nuclear cataract, bilateral: Secondary | ICD-10-CM | POA: Diagnosis not present

## 2021-08-31 DIAGNOSIS — H401131 Primary open-angle glaucoma, bilateral, mild stage: Secondary | ICD-10-CM | POA: Diagnosis not present

## 2021-08-31 DIAGNOSIS — H04123 Dry eye syndrome of bilateral lacrimal glands: Secondary | ICD-10-CM | POA: Diagnosis not present

## 2021-10-18 ENCOUNTER — Other Ambulatory Visit: Payer: Self-pay | Admitting: Family Medicine

## 2021-10-18 DIAGNOSIS — T7840XD Allergy, unspecified, subsequent encounter: Secondary | ICD-10-CM

## 2021-10-25 ENCOUNTER — Other Ambulatory Visit: Payer: Self-pay | Admitting: Cardiology

## 2021-10-26 ENCOUNTER — Other Ambulatory Visit: Payer: Self-pay | Admitting: Cardiology

## 2021-10-30 DIAGNOSIS — M65341 Trigger finger, right ring finger: Secondary | ICD-10-CM | POA: Diagnosis not present

## 2021-10-30 DIAGNOSIS — M19031 Primary osteoarthritis, right wrist: Secondary | ICD-10-CM | POA: Diagnosis not present

## 2021-11-24 ENCOUNTER — Ambulatory Visit (INDEPENDENT_AMBULATORY_CARE_PROVIDER_SITE_OTHER): Payer: Medicare Other | Admitting: Family Medicine

## 2021-11-24 ENCOUNTER — Encounter: Payer: Self-pay | Admitting: Family Medicine

## 2021-11-24 VITALS — BP 136/80 | HR 62 | Temp 99.1°F | Resp 18 | Ht 61.0 in | Wt 150.0 lb

## 2021-11-24 DIAGNOSIS — B070 Plantar wart: Secondary | ICD-10-CM | POA: Diagnosis not present

## 2021-11-24 NOTE — Progress Notes (Signed)
? ?Subjective:  ? ?By signing my name below, I, Kayla [name], attest that this documentation has been prepared under the direction and in the presence of  Kayla Held, DO. 11/24/2021 ? ? Patient ID: Kayla [name], female    DOB: [birth year], 76 y.o.   MRN: [id number] ? ?Chief Complaint  ?Patient presents with  ? Foot Problem  ?  Left foot  ? ? ?HPI ?Patient is in today for an office visit. ? ?She had seen a podiatrist a few years ago  for calluses on her feet that were painful. She is complaining of a sore on the arch of her left foot that is red and tender. Notes it makes it hard for her to walk.. ? ?Past Medical History:  ?Diagnosis Date  ? Abscess of left groin 11/10/2014  ? Asthma   ? Cellulitis 10/20/2014  ? Diverticulitis   ? Diverticulosis   ? Fasting hyperglycemia 04/2011  ? FBS 108  ? Fibromyalgia   ? Fundic gland polyps of stomach, benign   ? GERD (gastroesophageal reflux disease)   ? gastric polyp x3  ? Glaucoma   ?  Dr Bing Plume  ? IBS (irritable bowel syndrome)   ? Osteopenia   ? last 07/2011  ? Pneumonia   ?  OP as child  ? Shingles   ? ? ?Past Surgical History:  ?Procedure Laterality Date  ? COLONOSCOPY W/ POLYPECTOMY  [phone]  ? last  colonoscopy 2007, Dr Carlean Purl  ? ESOPHAGOGASTRODUODENOSCOPY    ? SPINE SURGERY    ? T9-L5 fusions  ? ? ?Family History  ?Problem Relation Age of Onset  ? Breast cancer Mother   ? Breast cancer Sister   ? Breast cancer Maternal Aunt   ?     two  ? Breast cancer Paternal Aunt   ? Asthma Paternal Aunt   ? Heart failure Father   ?     CHF, CABG late 70s  ? Kidney disease Maternal Grandmother   ? Ovarian cancer Other   ?     Niece x2  ? Breast cancer Other   ?     niece  ? Coronary artery disease Paternal Aunt   ?     triple CABG  ? Heart attack Paternal Grandmother   ?     MI in late 91s  ? Heart disease Paternal Grandfather   ? Heart attack Paternal Uncle   ?      MI in 60s  ? Heart disease Maternal Aunt   ? Pulmonary embolism Son   ? Deep vein thrombosis Son   ?  Heart Problems Son   ?     Heart attacks related to a "blood disorder"   ? Diabetes Neg Hx   ? Stroke Neg Hx   ? COPD Neg Hx   ? Colon polyps Neg Hx   ? ? ?Social History  ? ?Socioeconomic History  ? Marital status: Married  ?  Spouse name: Not on file  ? Number of children: Not on file  ? Years of education: Not on file  ? Highest education level: Not on file  ?Occupational History  ? Occupation: retired  ?Tobacco Use  ? Smoking status: Former  ?  Packs/day: 0.50  ?  Years: 10.00  ?  Pack years: 5.00  ?  Types: Cigarettes  ?  Quit date: 09/10/1974  ?  Years since quitting: 77.2  ? Smokeless tobacco: Never  ? Tobacco comments:  ?  smoked  [place], up to 1 ppd  ?Substance and Sexual Activity  ? Alcohol use: No  ?  Alcohol/week: 0.0 standard drinks  ? Drug use: No  ? Sexual activity: Yes  ?Other Topics Concern  ? Not on file  ?Social History Narrative  ? Daily caffeine 3 cups  ? Regular exercise  ? Married  ?   ?   ? ?Social Determinants of Health  ? ?Financial Resource Strain: Low Risk   ? Difficulty of Paying Living Expenses: Not hard at all  ?Food Insecurity: No Food Insecurity  ? Worried About Charity fundraiser in the Last Year: Never true  ? Ran Out of Food in the Last Year: Never true  ?Transportation Needs: No Transportation Needs  ? Lack of Transportation (Medical): No  ? Lack of Transportation (Non-Medical): No  ?Physical Activity: Inactive  ? Days of Exercise per Week: 0 days  ? Minutes of Exercise per Session: 0 min  ?Stress: No Stress Concern Present  ? Feeling of Stress : Not at all  ?Social Connections: Socially Isolated  ? Frequency of Communication with Friends and Family: More than three times a week  ? Frequency of Social Gatherings with Friends and Family: More than three times a week  ? Attends Religious Services: Never  ? Active Member of Clubs or Organizations: No  ? Attends Archivist Meetings: Never  ? Marital Status: Divorced  ?Intimate Partner Violence: Not At Risk  ? Fear of  Current or Ex-Partner: No  ? Emotionally Abused: No  ? Physically Abused: No  ? Sexually Abused: No  ? ? ?Outpatient Medications Prior to Visit  ?Medication Sig Dispense Refill  ? acetaminophen (TYLENOL) 500 MG tablet Take 500 mg by mouth as needed.    ? albuterol (PROVENTIL) (2.5 MG/3ML) 0.083% nebulizer solution Take 3 mLs (2.5 mg total) by nebulization every 6 (six) hours as needed for wheezing or shortness of breath. 75 mL 12  ? albuterol (VENTOLIN HFA) 108 (90 Base) MCG/ACT inhaler Inhale 2 puffs into the lungs every 6 (six) hours as needed for wheezing or shortness of breath. As needed only 18 g 3  ? amLODipine (NORVASC) 2.5 MG tablet TAKE 1 TABLET(2.5 MG) BY MOUTH DAILY 90 tablet 1  ? bifidobacterium infantis (ALIGN) capsule Take 1 capsule by mouth daily. 21 capsule 0  ? carboxymethylcellulose (REFRESH PLUS) 0.5 % SOLN 1 drop 3 (three) times daily as needed.    ? cholecalciferol (VITAMIN D) 1000 units tablet Take 2,000 Units by mouth daily.    ? EPINEPHrine (EPIPEN 2-PAK) 0.3 mg/0.3 mL IJ SOAJ injection As directed 1 each 0  ? famotidine (PEPCID) 10 MG tablet Take 10 mg by mouth at bedtime. Alternating with '20mg'$  tablets she had    ? fluticasone (FLONASE) 50 MCG/ACT nasal spray USE 2 SPRAYS IN EACH  NOSTRIL ONCE A DAY 48 g 3  ? hyoscyamine (LEVSIN SL) 0.125 MG SL tablet Place 1 tablet (0.125 mg total) under the tongue every 6 (six) hours as needed. 10 tablet 1  ? latanoprost (XALATAN) 0.005 % ophthalmic solution Place 1 drop into both eyes at bedtime.    ? metoprolol tartrate (LOPRESSOR) 25 MG tablet SCHEDULE OFFICE VISIT FOR FUTURE REFILLS 90 tablet 0  ? montelukast (SINGULAIR) 10 MG tablet TAKE 1 TABLET(10 MG) BY MOUTH AT BEDTIME 90 tablet 3  ? nystatin cream (MYCOSTATIN) Apply 1 application topically 2 (two) times daily. 90 g 3  ? Polyethyl Glycol-Propyl Glycol (SYSTANE) 0.4-0.3 % GEL ophthalmic  gel Place 1 application into both eyes.    ? sodium chloride (OCEAN) 0.65 % SOLN nasal spray Place 1 spray into  both nostrils as needed for congestion.    ? triamcinolone (KENALOG) 0.1 % Apply 1 application topically 2 (two) times daily. 90 g 3  ? Vitamin Mixture (ESTER-C PO) Take 1,000 mg by mouth daily.    ? rosuvastatin (CRESTOR) 10 MG tablet Take 1 tablet (10 mg total) by mouth daily. 30 tablet 2  ? Omega-3 Fatty Acids (FISH OIL PO) Take by mouth.    ? ?No facility-administered medications prior to visit.  ? ? ?Allergies  ?Allergen Reactions  ? Cephalosporins   ?  Blisters  orally  ? Gabapentin   ?  REACTION: rash  ? Levofloxacin   ?  REACTION: stomach ache and rash  ? Psyllium   ?  REACTION: rash  ? Sulfonamide Derivatives Anaphylaxis  ?  REACTION: shock, urticaria  ? Aspirin   ?  REACTION: stomach pain  ? Belladonna   ? Ciprofloxacin Hcl   ? Conjugated Estrogens   ? Doxycycline   ?  Abdominal pain  ? Flovent Hfa [Fluticasone]   ? Nabumetone   ? Nsaids   ? Soybean-Containing Drug Products Other (See Comments)  ?  GI upset  ? Zicam Cold Remedy [Homeopathic Products]   ? Zocor [Simvastatin] Itching  ? Nickel Itching and Rash  ? Prednisone Rash  ?  NO PROBLEM WITH MEDROL DOSE PAK ?Oral prednisone caused facial burning, "made my face feel like it was on fire"  ? ? ?Review of Systems  ?Constitutional:  Negative for fever.  ?HENT:  Negative for congestion, ear pain, hearing loss, sinus pain and sore throat.   ?Eyes:  Negative for blurred vision and pain.  ?Respiratory:  Negative for cough, sputum production, shortness of breath and wheezing.   ?Cardiovascular:  Negative for chest pain and palpitations.  ?Gastrointestinal:  Negative for blood in stool, constipation, diarrhea, nausea and vomiting.  ?Genitourinary:  Negative for dysuria, frequency, hematuria and urgency.  ?Musculoskeletal:  Negative for back pain, falls and myalgias.  ?Skin:   ?     (+) sore on left foot  ?Neurological:  Negative for dizziness, sensory change, loss of consciousness, weakness and headaches.  ?Endo/Heme/Allergies:  Negative for environmental  allergies. Does not bruise/bleed easily.  ?Psychiatric/Behavioral:  Negative for depression and suicidal ideas. The patient is not nervous/anxious and does not have insomnia.   ? ?   ?Objective:  ?  ?Physical Ex

## 2021-11-24 NOTE — Patient Instructions (Signed)
Plantar Warts ?Warts are small growths on the skin. When they occur on the underside (sole) of the foot, they are called plantar warts. Plantar warts often occur in groups, with several small warts around a larger wart. They tend to develop on the heel or the ball of the foot. They may grow into the deeper layers of skin or rise above the surface of the skin. ?Most warts are not painful, and they usually do not cause problems. However, plantar warts may cause pain when you walk because pressure is applied to them. Plantar warts may spread to other areas of the sole. They can also spread to other areas of the body through direct and indirect contact. Warts often go away on their own in time. Various treatments may be done if needed or desired. ?What are the causes? ?Plantar warts are caused by a type of virus that is called human papillomavirus (HPV). ?Walking barefoot can cause exposure to the virus, especially if your feet are wet. ?HPV attacks a break in the skin of the foot. ?What increases the risk? ?You are more likely to develop this condition if you: ?Are between 42 and 90 years of age. ?Use public showers or locker rooms. ?Have a weakened body defense system (immune system). ?What are the signs or symptoms? ?Common symptoms of this condition include: ?Flat or slightly raised growths that have a rough surface and look similar to a callus. ?Pain when you use your foot to support your body weight. ?How is this diagnosed? ?A plantar wart can usually be diagnosed from its appearance. In some cases, a tissue sample may be removed (biopsy) to be looked at under a microscope. ?How is this treated? ?In many cases, warts do not need treatment. Without treatment, they often go away with time. If treatment is needed or desired, options may include: ?Applying medicated solutions, creams, or patches to the wart. These may be over-the-counter or prescription medicines that make the skin soft so that layers will gradually  shed away. In many cases, the medicine is applied one or two times a day and covered with a bandage. ?Freezing the wart with liquid nitrogen (cryotherapy). ?Burning the wart with: ?Laser treatment. ?An electrified probe (electrocautery). ?Injecting a medicine (Candida antigen) into the wart to help the body's immune system fight off the wart. ?Having surgery to remove the wart. ?Putting duct tape over the top of the wart (occlusion). You will leave the tape in place for as long as told by your health care provider, and then you will replace it with a new strip of tape. This is done until the wart goes away. ?Repeat treatment may be needed if you choose to remove warts. Warts sometimes go away and come back again. ?Follow these instructions at home: ?Apply medicated creams or solutions only as told by your health care provider. This may involve: ?Soaking the affected area in warm water. ?Removing the top layer of softened skin before you apply the medicine. A pumice stone works well for removing the skin. ?Applying a bandage over the affected area after you apply the medicine. ?Repeating the process daily or as told by your health care provider. ?Do not scratch or pick at a wart. ?Wash your hands after you touch a wart. ?If a wart is painful, try covering it with a bandage that has a hole in the middle. This helps to take pressure off the wart. ?Keep all follow-up visits as told by your health care provider. This is important. ?How  is this prevented? ?Take these actions to help prevent warts: ?Wear shoes and socks. Change your socks daily. ?Keep your feet clean and dry. ?Do not walk barefoot in shared locker rooms, shower areas, or swimming pools. ?Check your feet regularly. ?Avoid direct contact with warts on other people. ?Contact a health care provider if: ?Your warts do not improve after treatment. ?You have redness, swelling, or pain at the site of a wart. ?You have bleeding from a wart that does not stop with  light pressure. ?You have diabetes and you develop a wart. ?Summary ?Warts are small growths on the skin. When they occur on the underside (sole) of the foot, they are called plantar warts. ?In many cases, warts do not need treatment. Without treatment, they often go away with time. ?Apply medicated creams or solutions only as told by your health care provider. ?Do not scratch or pick at a wart. Wash your hands after you touch a wart. ?Keep all follow-up visits as told by your health care provider. This is important. ?This information is not intended to replace advice given to you by your health care provider. Make sure you discuss any questions you have with your health care provider. ?Document Revised: 12/15/2020 Document Reviewed: 12/15/2020 ?Elsevier Patient Education ? [place]. ? ?

## 2021-11-30 NOTE — Progress Notes (Signed)
?  ?Cardiology Office Note ? ? ?Date:  12/01/2021  ? ?ID:  Kayla [name], DOB 10-26-1945, MRN [id number] ? ?PCP:  [name], DO  ?Cardiologist:   None ? ? ?Chief Complaint  ?Patient presents with  ? Palpitations  ? ? ?  ?History of Present Illness: ?Kayla [name] is a 76 y.o. female who is referred by Carollee Herter, Alferd Apa, DO for evaluation of palpitations. I saw her in 2014 for palpitations.  She was in the emergency room on 08/02/2020 with palpitations and some chest discomfort.She had a 21 day event monitor.  She had some brief runs of SVT.  She had normal coronaries on CT in 2022.   ? ?She returns for follow-up.  She will occasionally get some palpitations with some slow heartbeats but these are not particularly problematic.  She will occasionally have some chest discomfort unchanged from symptoms she had with her stress test and at the time of her CT.  She is not getting any classic substernal chest pressure, neck or arm discomfort.  She is not having any new shortness of breath, PND or orthopnea.  She has no weight gain or edema. ? ?Past Medical History:  ?Diagnosis Date  ? Abscess of left groin 11/10/2014  ? Asthma   ? Cellulitis 10/20/2014  ? Diverticulitis   ? Diverticulosis   ? Fasting hyperglycemia 04/2011  ? FBS 108  ? Fibromyalgia   ? Fundic gland polyps of stomach, benign   ? GERD (gastroesophageal reflux disease)   ? gastric polyp x3  ? Glaucoma   ?  Dr Bing Plume  ? IBS (irritable bowel syndrome)   ? Osteopenia   ? last 07/2011  ? Pneumonia   ?  OP as child  ? Shingles   ? ? ?Past Surgical History:  ?Procedure Laterality Date  ? COLONOSCOPY W/ POLYPECTOMY  [id number]  ? last  colonoscopy 2007, Dr Carlean Purl  ? ESOPHAGOGASTRODUODENOSCOPY    ? SPINE SURGERY    ? T9-L5 fusions  ? ? ? ?Current Outpatient Medications  ?Medication Sig Dispense Refill  ? acetaminophen (TYLENOL) 500 MG tablet Take 500 mg by mouth as needed.    ? amLODipine (NORVASC) 2.5 MG tablet TAKE 1 TABLET(2.5 MG) BY MOUTH  DAILY 90 tablet 1  ? bifidobacterium infantis (ALIGN) capsule Take 1 capsule by mouth daily. 21 capsule 0  ? carboxymethylcellulose (REFRESH PLUS) 0.5 % SOLN 1 drop 3 (three) times daily as needed.    ? cholecalciferol (VITAMIN D) 1000 units tablet Take 2,000 Units by mouth daily.    ? EPINEPHrine (EPIPEN 2-PAK) 0.3 mg/0.3 mL IJ SOAJ injection As directed 1 each 0  ? famotidine (PEPCID) 10 MG tablet Take 10 mg by mouth at bedtime. Alternating with '20mg'$  tablets she had    ? Flaxseed, Linseed, (GROUND FLAX SEEDS PO)     ? hyoscyamine (LEVSIN SL) 0.125 MG SL tablet Place 1 tablet (0.125 mg total) under the tongue every 6 (six) hours as needed. 10 tablet 1  ? latanoprost (XALATAN) 0.005 % ophthalmic solution Place 1 drop into both eyes at bedtime.    ? metoprolol tartrate (LOPRESSOR) 25 MG tablet SCHEDULE OFFICE VISIT FOR FUTURE REFILLS 90 tablet 0  ? montelukast (SINGULAIR) 10 MG tablet TAKE 1 TABLET(10 MG) BY MOUTH AT BEDTIME 90 tablet 3  ? nystatin cream (MYCOSTATIN) Apply 1 application topically 2 (two) times daily. 90 g 3  ? Polyethyl Glycol-Propyl Glycol (SYSTANE) 0.4-0.3 % GEL ophthalmic gel Place 1 application into  both eyes.    ? sodium chloride (OCEAN) 0.65 % SOLN nasal spray Place 1 spray into both nostrils as needed for congestion.    ? triamcinolone (KENALOG) 0.1 % Apply 1 application topically 2 (two) times daily. 90 g 3  ? Vitamin Mixture (ESTER-C PO) Take 1,000 mg by mouth daily.    ? albuterol (PROVENTIL) (2.5 MG/3ML) 0.083% nebulizer solution Take 3 mLs (2.5 mg total) by nebulization every 6 (six) hours as needed for wheezing or shortness of breath. (Patient not taking: Reported on 12/01/2021) 75 mL 12  ? albuterol (VENTOLIN HFA) 108 (90 Base) MCG/ACT inhaler Inhale 2 puffs into the lungs every 6 (six) hours as needed for wheezing or shortness of breath. As needed only (Patient not taking: Reported on 12/01/2021) 18 g 3  ? fluticasone (FLONASE) 50 MCG/ACT nasal spray USE 2 SPRAYS IN EACH  NOSTRIL ONCE A  DAY (Patient not taking: Reported on 12/01/2021) 48 g 3  ? ?No current facility-administered medications for this visit.  ? ? ?Allergies:   Cephalosporins, Gabapentin, Levofloxacin, Psyllium, Sulfonamide derivatives, Aspirin, Belladonna, Ciprofloxacin hcl, Conjugated estrogens, Doxycycline, Flovent hfa [fluticasone], Nabumetone, Nsaids, Soybean-containing drug products, Zicam cold remedy [homeopathic products], Zocor [simvastatin], Nickel, and Prednisone  ? ? ?ROS:  Please see the history of present illness.   Otherwise, review of systems are positive for none.   All other systems are reviewed and negative.  ? ? ?PHYSICAL EXAM: ?VS:  BP 130/78   Pulse (!) 55   Ht '5\' 1"'$  (1.549 m)   Wt 151 lb 12.8 oz (68.9 kg)   SpO2 91%   BMI 28.68 kg/m?  , BMI Body mass index is 28.68 kg/m?. ?GENERAL:  Well appearing ?NECK:  No jugular venous distention, waveform within normal limits, carotid upstroke brisk and symmetric, no bruits, no thyromegaly ?LUNGS:  Clear to auscultation bilaterally ?CHEST:  Unremarkable ?HEART:  PMI not displaced or sustained,S1 and S2 within normal limits, no S3, no S4, no clicks, no rubs, no murmurs ?ABD:  Flat, positive bowel sounds normal in frequency in pitch, no bruits, no rebound, no guarding, no midline pulsatile mass, no hepatomegaly, no splenomegaly ?EXT:  2 plus pulses throughout, no edema, no cyanosis no clubbing ? ? ?EKG:  EKG is  ordered today. ?The ekg ordered today demonstrates sinus bradycardia, rate 55, axis within normal limits, intervals within normal limits, no acute ST-T wave changes. ? ? ?Recent Labs: ?07/17/2021: ALT 12; BUN 15; Creatinine, Ser 0.69; Potassium 4.4; Sodium 138  ? ? ?Lipid Panel ?   ?Component Value Date/Time  ? CHOL 209 (H) 07/17/2021 0949  ? TRIG 78.0 07/17/2021 0949  ? HDL 64.00 07/17/2021 0949  ? CHOLHDL 3 07/17/2021 0949  ? VLDL 15.6 07/17/2021 0949  ? [place] 129 (H) 07/17/2021 8295  ? LDLCALC 125 (H) 06/15/2020 1026  ? LDLDIRECT 153.3 12/18/2012 1456  ? ?   ? ?Wt Readings from Last 3 Encounters:  ?12/01/21 151 lb 12.8 oz (68.9 kg)  ?11/24/21 150 lb (68 kg)  ?03/14/21 154 lb 12.8 oz (70.2 kg)  ?  ? ? ?Other studies Reviewed: ?Additional studies/ records that were reviewed today include: Labs. ?Review of the above records demonstrates:  Please see elsewhere in the note.   ? ? ?ASSESSMENT AND PLAN: ? ?PALPITATIONS:    She has some supraventricular arrhythmias but she is not particular bothered by these.  No change in therapy.  She tolerates the low-dose beta-blocker despite the low heart rate. ? ?CHEST PAIN:   This is  nonanginal chest pain.  No further work-up is suggested. ? ?DYSLIPIDEMIA: She did not want to take simvastatin which was recently prescribed.  I think with a calcium score of 0 and the fact that she was using the Atkins diet that she could be managed with lifestyle changes and in particular attention to diet and she and I talked about this. ? ? ?Current medicines are reviewed at length with the patient today.  The patient does not have concerns regarding medicines. ? ?The following changes have been made:   None ? ?Labs/ tests ordered today include: None ? ?Orders Placed This Encounter  ?Procedures  ? EKG 12-Lead  ? ? ? ?Disposition:   FU with me as needed ? ? ?Signed, ?Minus Breeding, MD  ?12/01/2021 12:56 PM    ?[place] ? ? ? ?

## 2021-12-01 ENCOUNTER — Ambulatory Visit: Payer: Medicare Other | Admitting: Cardiology

## 2021-12-01 ENCOUNTER — Encounter: Payer: Self-pay | Admitting: Cardiology

## 2021-12-01 ENCOUNTER — Other Ambulatory Visit: Payer: Self-pay

## 2021-12-01 VITALS — BP 130/78 | HR 55 | Ht 61.0 in | Wt 151.8 lb

## 2021-12-01 DIAGNOSIS — R002 Palpitations: Secondary | ICD-10-CM

## 2021-12-01 DIAGNOSIS — R072 Precordial pain: Secondary | ICD-10-CM | POA: Diagnosis not present

## 2021-12-01 NOTE — Patient Instructions (Signed)
Medication Instructions:  Your physician recommends that you continue on your current medications as directed. Please refer to the Current Medication list given to you today.  *If you need a refill on your cardiac medications before your next appointment, please call your pharmacy*  Follow-Up: At [facility], you and your health needs are our priority.  As part of our continuing mission to provide you with exceptional heart care, we have created designated Provider Care Teams.  These Care Teams include your primary Cardiologist (physician) and Advanced Practice Providers (APPs -  Physician Assistants and Nurse Practitioners) who all work together to provide you with the care you need, when you need it.  We recommend signing up for the patient portal called "MyChart".  Sign up information is provided on this After Visit Summary.  MyChart is used to connect with patients for Virtual Visits (Telemedicine).  Patients are able to view lab/test results, encounter notes, upcoming appointments, etc.  Non-urgent messages can be sent to your provider as well.   To learn more about what you can do with MyChart, go to [email or web address].    Your next appointment:   AS NEEDED with Dr. Hochrein   

## 2021-12-04 ENCOUNTER — Other Ambulatory Visit: Payer: Self-pay

## 2021-12-04 ENCOUNTER — Ambulatory Visit: Payer: Medicare Other | Admitting: Podiatrist

## 2021-12-04 ENCOUNTER — Encounter: Payer: Self-pay | Admitting: Podiatrist

## 2021-12-04 DIAGNOSIS — L84 Corns and callosities: Secondary | ICD-10-CM | POA: Diagnosis not present

## 2021-12-04 NOTE — Patient Instructions (Signed)
Corns and Calluses Corns are small areas of thickened skin that form on the top, sides, or tip of a toe. Corns have a cone-shaped core with a point that can press on a nerve below. This causes pain. Calluses are areas of thickened skin that can form anywhere on the body, including the hands, fingers, palms, soles of the feet, and heels. Calluses are usually larger than corns. What are the causes? Corns and calluses are caused by rubbing (friction) or pressure, such as from shoes that are too tight or do not fit properly. What increases the risk? Corns are more likely to develop in people who have misshapen toes (toe deformities), such as hammer toes. Calluses can form with friction to any area of the skin. They are more likely to develop in people who: Work with their hands. Wear shoes that fit poorly, are too tight, or are high-heeled. Have toe deformities. What are the signs or symptoms? Symptoms of a corn or callus include: A hard growth on the skin. Pain or tenderness under the skin. Redness and swelling. Increased discomfort while wearing tight-fitting shoes, if your feet are affected. If a corn or callus becomes infected, symptoms may include: Redness and swelling that gets worse. Pain. Fluid, blood, or pus draining from the corn or callus. How is this diagnosed? Corns and calluses may be diagnosed based on your symptoms, your medical history, and a physical exam. How is this treated? Treatment for corns and calluses may include: Removing the cause of the friction or pressure. This may involve: Changing your shoes. Wearing shoe inserts (orthotics) or other protective layers in your shoes, such as a corn pad. Wearing gloves. Applying medicine to the skin (topical medicine) to help soften skin in the hardened, thickened areas. Removing layers of dead skin with a file to reduce the size of the corn or callus. Removing the corn or callus with a scalpel or laser. Taking antibiotic  medicines, if your corn or callus is infected. Having surgery, if a toe deformity is the cause. Follow these instructions at home:  Take over-the-counter and prescription medicines only as told by your health care provider. If you were prescribed an antibiotic medicine, take it as told by your health care provider. Do not stop taking it even if your condition improves. Wear shoes that fit well. Avoid wearing high-heeled shoes and shoes that are too tight or too loose. Wear any padding, protective layers, gloves, or orthotics as told by your health care provider. Soak your hands or feet. Then use a file or pumice stone to soften your corn or callus. Do this as told by your health care provider. Check your corn or callus every day for signs of infection. Contact a health care provider if: Your symptoms do not improve with treatment. You have redness or swelling that gets worse. Your corn or callus becomes painful. You have fluid, blood, or pus coming from your corn or callus. You have new symptoms. Get help right away if: You develop severe pain with redness. Summary Corns are small areas of thickened skin that form on the top, sides, or tip of a toe. These can be painful. Calluses are areas of thickened skin that can form anywhere on the body, including the hands, fingers, palms, and soles of the feet. Calluses are usually larger than corns. Corns and calluses are caused by rubbing (friction) or pressure, such as from shoes that are too tight or do not fit properly. Treatment may include wearing padding, protective   layers, gloves, or orthotics as told by your health care provider. This information is not intended to replace advice given to you by your health care provider. Make sure you discuss any questions you have with your health care provider. Document Revised: 12/24/2019 Document Reviewed: 12/24/2019 Elsevier Patient Education  2022 [employer] Inc[employer]  

## 2021-12-06 NOTE — Progress Notes (Signed)
?Chief Complaint  ?Patient presents with  ? Callouses  ?  ? ?HPI: Patient is 76 y.o. female who presents today for  a painful callus on her right foot near her great toe joint as well as a lesion on the bottom of her left foot.  She relates she has noticed the areas growing and they are bothersome to her.  She has tried no self treatment.  ? ?Patient Active Problem List  ? Diagnosis Date Noted  ? Pulmonary nodule less than 1 cm in diameter with low risk for malignant neoplasm 03/14/2021  ? Asthmatic bronchitis 03/09/2020  ? Allergies 01/18/2020  ? Preventative health care 03/29/2019  ? Pneumonia of left lower lobe due to infectious organism 09/01/2018  ? Vaginal atrophy 03/24/2018  ? Seasonal allergies 02/08/2018  ? Erythema nodosum 06/09/2016  ? Herpes zoster 09/23/2013  ? Superficial bruising of finger 02/16/2013  ? Hyperlipidemia 11/01/2011  ? Palpitations 05/23/2011  ? Essential hypertension 04/23/2011  ? Cough 04/09/2011  ? THYROID FUNCTION TEST, ABNORMAL 12/20/2009  ? COLONIC POLYPS, HX OF 11/08/2008  ? Asthma 01/21/2008  ? GERD 01/21/2008  ? IRRITABLE BOWEL SYNDROME 01/21/2008  ? FIBROMYALGIA 01/21/2008  ? Vitamin D deficiency 04/02/2007  ? [place] DISEASE 04/02/2007  ? GLAUCOMA, LOW TENSION 02/06/2007  ? Osteoporosis 02/06/2007  ? ? ?Current Outpatient Medications on File Prior to Visit  ?Medication Sig Dispense Refill  ? acetaminophen (TYLENOL) 500 MG tablet Take 500 mg by mouth as needed.    ? albuterol (PROVENTIL) (2.5 MG/3ML) 0.083% nebulizer solution Take 3 mLs (2.5 mg total) by nebulization every 6 (six) hours as needed for wheezing or shortness of breath. (Patient not taking: Reported on 12/01/2021) 75 mL 12  ? albuterol (VENTOLIN HFA) 108 (90 Base) MCG/ACT inhaler Inhale 2 puffs into the lungs every 6 (six) hours as needed for wheezing or shortness of breath. As needed only (Patient not taking: Reported on 12/01/2021) 18 g 3  ? amLODipine (NORVASC) 2.5 MG tablet TAKE 1 TABLET(2.5 MG) BY  MOUTH DAILY 90 tablet 1  ? bifidobacterium infantis (ALIGN) capsule Take 1 capsule by mouth daily. 21 capsule 0  ? carboxymethylcellulose (REFRESH PLUS) 0.5 % SOLN 1 drop 3 (three) times daily as needed.    ? cholecalciferol (VITAMIN D) 1000 units tablet Take 2,000 Units by mouth daily.    ? EPINEPHrine (EPIPEN 2-PAK) 0.3 mg/0.3 mL IJ SOAJ injection As directed 1 each 0  ? famotidine (PEPCID) 10 MG tablet Take 10 mg by mouth at bedtime. Alternating with '20mg'$  tablets she had    ? Flaxseed, Linseed, (GROUND FLAX SEEDS PO)     ? fluticasone (FLONASE) 50 MCG/ACT nasal spray USE 2 SPRAYS IN EACH  NOSTRIL ONCE A DAY (Patient not taking: Reported on 12/01/2021) 48 g 3  ? hyoscyamine (LEVSIN SL) 0.125 MG SL tablet Place 1 tablet (0.125 mg total) under the tongue every 6 (six) hours as needed. 10 tablet 1  ? latanoprost (XALATAN) 0.005 % ophthalmic solution Place 1 drop into both eyes at bedtime.    ? metoprolol tartrate (LOPRESSOR) 25 MG tablet SCHEDULE OFFICE VISIT FOR FUTURE REFILLS 90 tablet 0  ? montelukast (SINGULAIR) 10 MG tablet TAKE 1 TABLET(10 MG) BY MOUTH AT BEDTIME 90 tablet 3  ? nystatin cream (MYCOSTATIN) Apply 1 application topically 2 (two) times daily. 90 g 3  ? Polyethyl Glycol-Propyl Glycol (SYSTANE) 0.4-0.3 % GEL ophthalmic gel Place 1 application into both eyes.    ? sodium chloride (OCEAN) 0.65 % SOLN nasal  spray Place 1 spray into both nostrils as needed for congestion.    ? triamcinolone (KENALOG) 0.1 % Apply 1 application topically 2 (two) times daily. 90 g 3  ? Vitamin Mixture (ESTER-C PO) Take 1,000 mg by mouth daily.    ? ?No current facility-administered medications on file prior to visit.  ? ? ?Allergies  ?Allergen Reactions  ? Cephalosporins   ?  Blisters  orally  ? Gabapentin   ?  REACTION: rash  ? Levofloxacin   ?  REACTION: stomach ache and rash  ? Psyllium   ?  REACTION: rash  ? Sulfonamide Derivatives Anaphylaxis  ?  REACTION: shock, urticaria  ? Aspirin   ?  REACTION: stomach pain  ?  Belladonna   ? Ciprofloxacin Hcl   ? Conjugated Estrogens   ? Doxycycline   ?  Abdominal pain  ? Flovent Hfa [Fluticasone]   ? Nabumetone   ? Nsaids   ? Soybean-Containing Drug Products Other (See Comments)  ?  GI upset  ? Zicam Cold Remedy [Homeopathic Products]   ? Zocor [Simvastatin] Itching  ? Nickel Itching and Rash  ? Prednisone Rash  ?  NO PROBLEM WITH MEDROL DOSE PAK ?Oral prednisone caused facial burning, "made my face feel like it was on fire"  ? ? ?Review of Systems ?No fevers, chills, nausea, muscle aches, no difficulty breathing, no calf pain, no chest pain or shortness of breath. ? ? ?Physical Exam ? ?GENERAL APPEARANCE: Alert, conversant. Appropriately groomed. No acute distress.  ? ?VASCULAR: Pedal pulses palpable DP and PT bilateral.  Capillary refill time is immediate to all digits,  Proximal to distal cooling it warm to warm.  Digital perfusion adequate.  ? ?NEUROLOGIC: sensation is intact to 5.07 monofilament at 5/5 sites bilateral.  Light touch is intact bilateral, vibratory sensation intact bilateral ? ?MUSCULOSKELETAL: acceptable muscle strength, tone and stability bilateral.  Pes planus foot type is noted.  Moderate bunion prominence is noted on the left foot.  Decreased range of motion of the first MPJ is noted right greater than left.   ? ?DERMATOLOGIC: skin is warm, supple, and dry.  Color, texture, and turgor of skin within normal limits.  No open wounds are noted.  Nails are asymptomatic at todays visit ? ?Well circumscribed soft tissue lesion is present planter medial of the left foot.  Intractable keratosis is present with ground glass appearance noted.  No hemorrhagic or preulcerative tissue is encountered with debridement ? ?Large callus is present on the right hallux plantarly.  Diffuse hyperkeratotic tissue is seen. Skin tension lines are noted.   ? ? ? ?Assessment  ? ?  ICD-10-CM   ?1. Callus  L84   ?  ? ? ? ?Plan ? ?Discussed exam findings with the patient and recommended  debridement of the lesions.  The patient agreed and this was carried out today with a 15 blade without complication.  I recommended soft accomidative shoe gear and recommended she call if the lesions return. ?

## 2021-12-11 ENCOUNTER — Encounter: Payer: Self-pay | Admitting: Family Medicine

## 2021-12-11 DIAGNOSIS — R21 Rash and other nonspecific skin eruption: Secondary | ICD-10-CM

## 2021-12-11 MED ORDER — NYSTATIN 100000 UNIT/GM EX CREA: 1.0000 "application " | TOPICAL_CREAM | Freq: Two times a day (BID) | CUTANEOUS | 1 refills | Status: DC

## 2022-01-03 ENCOUNTER — Encounter: Payer: Self-pay | Admitting: Family Medicine

## 2022-01-03 ENCOUNTER — Other Ambulatory Visit: Payer: Self-pay | Admitting: *Deleted

## 2022-01-03 DIAGNOSIS — T782XXS Anaphylactic shock, unspecified, sequela: Secondary | ICD-10-CM

## 2022-01-03 MED ORDER — EPINEPHRINE 0.3 MG/0.3ML IJ SOAJ: INTRAMUSCULAR | 0 refills | Status: DC

## 2022-01-08 ENCOUNTER — Other Ambulatory Visit: Payer: Self-pay | Admitting: Cardiology

## 2022-01-10 ENCOUNTER — Other Ambulatory Visit (HOSPITAL_BASED_OUTPATIENT_CLINIC_OR_DEPARTMENT_OTHER): Payer: Self-pay

## 2022-01-10 ENCOUNTER — Encounter: Payer: Self-pay | Admitting: Medical

## 2022-01-10 ENCOUNTER — Telehealth: Payer: Self-pay | Admitting: Family Medicine

## 2022-01-10 ENCOUNTER — Encounter (HOSPITAL_BASED_OUTPATIENT_CLINIC_OR_DEPARTMENT_OTHER): Payer: Self-pay

## 2022-01-10 ENCOUNTER — Ambulatory Visit (INDEPENDENT_AMBULATORY_CARE_PROVIDER_SITE_OTHER): Payer: Medicare Other | Admitting: Medical

## 2022-01-10 ENCOUNTER — Ambulatory Visit (HOSPITAL_BASED_OUTPATIENT_CLINIC_OR_DEPARTMENT_OTHER)
Admission: RE | Admit: 2022-01-10 | Discharge: 2022-01-10 | Disposition: A | Discharge status: Home / Self Care | Payer: Medicare Other | Source: Ambulatory Visit | Attending: Medical | Admitting: Medical

## 2022-01-10 VITALS — BP 138/60 | HR 62 | Resp 18 | Ht 61.0 in | Wt 149.7 lb

## 2022-01-10 DIAGNOSIS — R1030 Lower abdominal pain, unspecified: Secondary | ICD-10-CM | POA: Insufficient documentation

## 2022-01-10 DIAGNOSIS — R1032 Left lower quadrant pain: Secondary | ICD-10-CM | POA: Diagnosis not present

## 2022-01-10 DIAGNOSIS — R1033 Periumbilical pain: Secondary | ICD-10-CM

## 2022-01-10 LAB — POC URINALSYSI DIPSTICK (AUTOMATED)
Bilirubin, UA: NEGATIVE
Blood, UA: NEGATIVE
Glucose, UA: NEGATIVE
Ketones, UA: NEGATIVE
Leukocytes, UA: NEGATIVE
Nitrite, UA: NEGATIVE
Protein, UA: NEGATIVE
Spec Grav, UA: 1.015 (ref 1.010–1.025)
Urobilinogen, UA: NEGATIVE E.U./dL — AB
pH, UA: 7.5 (ref 5.0–8.0)

## 2022-01-10 LAB — CBC WITH DIFFERENTIAL/PLATELET
Basophils Absolute: 0 10*3/uL (ref 0.0–0.1)
Basophils Relative: 0.3 % (ref 0.0–3.0)
Eosinophils Absolute: 0 10*3/uL (ref 0.0–0.7)
Eosinophils Relative: 0.6 % (ref 0.0–5.0)
HCT: 40.6 % (ref 36.0–46.0)
Hemoglobin: 13.7 g/dL (ref 12.0–15.0)
Lymphocytes Relative: 12.4 % (ref 12.0–46.0)
Lymphs Abs: 0.8 10*3/uL (ref 0.7–4.0)
MCHC: 33.8 g/dL (ref 30.0–36.0)
MCV: 90.9 fl (ref 78.0–100.0)
Monocytes Absolute: 0.7 10*3/uL (ref 0.1–1.0)
Monocytes Relative: 9.7 % (ref 3.0–12.0)
Neutro Abs: 5.2 10*3/uL (ref 1.4–7.7)
Neutrophils Relative %: 77 % (ref 43.0–77.0)
Platelets: 150 10*3/uL (ref 150.0–400.0)
RBC: 4.47 Mil/uL (ref 3.87–5.11)
RDW: 13.7 % (ref 11.5–15.5)
WBC: 6.7 10*3/uL (ref 4.0–10.5)

## 2022-01-10 LAB — COMPREHENSIVE METABOLIC PANEL
ALT: 15 U/L (ref 0–35)
AST: 21 U/L (ref 0–37)
Albumin: 4.4 g/dL (ref 3.5–5.2)
Alkaline Phosphatase: 94 U/L (ref 39–117)
BUN: 12 mg/dL (ref 6–23)
CO2: 30 mEq/L (ref 19–32)
Calcium: 9.8 mg/dL (ref 8.4–10.5)
Chloride: 99 mEq/L (ref 96–112)
Creatinine, Ser: 0.69 mg/dL (ref 0.40–1.20)
GFR: 84.35 mL/min (ref >60.00)
Glucose, Bld: 100 mg/dL — ABNORMAL HIGH (ref 70–99)
Potassium: 5.7 mEq/L — ABNORMAL HIGH (ref 3.5–5.1)
Sodium: 134 mEq/L — ABNORMAL LOW (ref 135–145)
Total Bilirubin: 0.7 mg/dL (ref 0.2–1.2)
Total Protein: 7.3 g/dL (ref 6.0–8.3)

## 2022-01-10 MED ORDER — AMOXICILLIN-POT CLAVULANATE 875-125 MG PO TABS: 1.0000 | ORAL_TABLET | Freq: Two times a day (BID) | ORAL | 0 refills | Status: DC

## 2022-01-10 MED ORDER — HYOSCYAMINE SULFATE 0.125 MG SL SUBL: 0.1250 mg | SUBLINGUAL_TABLET | Freq: Four times a day (QID) | SUBLINGUAL | 1 refills | Status: DC | PRN

## 2022-01-10 MED ORDER — METRONIDAZOLE 500 MG PO TABS: 500.0000 mg | ORAL_TABLET | Freq: Three times a day (TID) | ORAL | 0 refills | Status: AC

## 2022-01-10 MED ORDER — IOHEXOL 300 MG/ML  SOLN
100.0000 mL | Freq: Once | INTRAMUSCULAR | Status: AC | PRN
  Administered 2022-01-10: 100 mL via INTRAVENOUS

## 2022-01-10 MED ORDER — SODIUM POLYSTYRENE SULFONATE 15 GM/60ML PO SUSP
15.0000 g | Freq: Every day | ORAL | 0 refills | Status: DC
Start: 2022-01-10 — End: 2022-01-10
  Filled 2022-01-10: qty 240, 4d supply, fill #0

## 2022-01-10 MED ORDER — SODIUM POLYSTYRENE SULFONATE 15 GM/60ML PO SUSP: 15.0000 g | Freq: Every day | ORAL | 0 refills | Status: DC

## 2022-01-10 NOTE — Patient Instructions (Addendum)
For your lower abdomen pain moderate level on exam will get cbc and cmp stat. Decided to go ahead and order ct abd and pelvis with contrast to evaluate for diverticulitis. Your level of pain on exam presently does not appear to be consistent with ibs alone. ? ?After lab and imaging review may rx antibiotic. ? ?Pending lab and imaging results you could go ahead and try levsin. I refilled that today. ? ?If your symptoms become severe pending prior authorization of ct/scheduling ct then recommend be seen in the emergency department ? ?Follow up in presently in one week but may advise sooner appt based on lab/imaging results. ?

## 2022-01-10 NOTE — Telephone Encounter (Signed)
Rx sent 

## 2022-01-10 NOTE — Telephone Encounter (Signed)
Pt called stating that she needed her sodium polystyrene sent to [facility] since her regular pharmacy is unable to fill the prescription. Pt has already discussed this with Percell Miller and he is aware of the issue. Please Advise. ? ?Medication:  ? ?Rx #: [id number]  ?sodium polystyrene (KAYEXALATE) 15 GM/60ML suspension [[id number]]  ? ?Has the patient contacted their pharmacy? Yes.   ?(If no, request that the patient contact the pharmacy for the refill.) ?(If yes, when and what did the pharmacy advise?) Can't fill perscription. Send to other pharmacy. ? ?Preferred Pharmacy (with phone number or street name):  ? ?[facility] ?[address], [place], [place], [place] [address] ?Phone: [phone] Fax: [phone] ? ?Agent: Please be advised that RX refills may take up to 3 business days. We ask that you follow-up with your pharmacy. ? ?

## 2022-01-10 NOTE — Addendum Note (Signed)
Addended by: Anabel Halon on: 01/10/2022 04:18 PM ? ? Modules accepted: Orders ? ?

## 2022-01-10 NOTE — Addendum Note (Signed)
Addended by: Anabel Halon on: 01/10/2022 02:20 PM ? ? Modules accepted: Orders ? ?

## 2022-01-10 NOTE — Progress Notes (Signed)
? ?Subjective:  ? ? Patient ID: Kayla [name], female    DOB: [birth year], 76 y.o.   MRN: [id number] ? ?HPI ? ?Pt in reporting recently she had pain mid lower suprapubic area radiating to both lower quadrants since late yesterday afternoon. Moderate level discomfort across lower abd but more on left side by last night. Pt states has hx of ibs in past. Hx of diverticulitis but she states intensity of her pain is not like it was when had diverticulitis. ? ?Pt has old rx of hycosamine. It was expired so she did not use. Pt gi MD is Dr. Carlean Purl.  ? ?Now pain is minimal. No fever, no sweats, no nausea or vomiting. ? ?Today she had 2 bowel movements. No recent conspation. ? ?In past pt had some mixed ibs. She states at times some constiaption and diarrhea. ? ? ?Review of Systems  ?Constitutional:  Negative for chills, fatigue and fever.  ?Respiratory:  Negative for cough, chest tightness, shortness of breath and wheezing.   ?Cardiovascular:  Negative for chest pain and palpitations.  ?Gastrointestinal:  Positive for abdominal pain. Negative for abdominal distention, blood in stool, constipation, diarrhea and nausea.  ?Genitourinary:  Negative for difficulty urinating, dysuria, flank pain, frequency and hematuria.  ?Musculoskeletal:  Negative for back pain.  ?Neurological:  Negative for seizures, facial asymmetry, speech difficulty, weakness and light-headedness.  ?Hematological:  Negative for adenopathy. Does not bruise/bleed easily.  ?Psychiatric/Behavioral:  Negative for behavioral problems and confusion.   ? ? ?Past Medical History:  ?Diagnosis Date  ? Abscess of left groin 11/10/2014  ? Asthma   ? Cellulitis 10/20/2014  ? Diverticulitis   ? Diverticulosis   ? Fasting hyperglycemia 04/2011  ? FBS 108  ? Fibromyalgia   ? Fundic gland polyps of stomach, benign   ? GERD (gastroesophageal reflux disease)   ? gastric polyp x3  ? Glaucoma   ?  Dr Bing Plume  ? IBS (irritable bowel syndrome)   ? Osteopenia   ? last 07/2011  ?  Pneumonia   ?  OP as child  ? Shingles   ? ?  ?Social History  ? ?Socioeconomic History  ? Marital status: Married  ?  Spouse name: Not on file  ? Number of children: Not on file  ? Years of education: Not on file  ? Highest education level: Not on file  ?Occupational History  ? Occupation: retired  ?Tobacco Use  ? Smoking status: Former  ?  Packs/day: 0.50  ?  Years: 10.00  ?  Pack years: 5.00  ?  Types: Cigarettes  ?  Quit date: 09/10/1974  ?  Years since quitting: 47.3  ? Smokeless tobacco: Never  ? Tobacco comments:  ?  smoked [place], up to 1 ppd  ?Substance and Sexual Activity  ? Alcohol use: No  ?  Alcohol/week: 0.0 standard drinks  ? Drug use: No  ? Sexual activity: Yes  ?Other Topics Concern  ? Not on file  ?Social History Narrative  ? Daily caffeine 3 cups  ? Regular exercise  ? Married  ?   ?   ? ?Social Determinants of Health  ? ?Financial Resource Strain: Low Risk   ? Difficulty of Paying Living Expenses: Not hard at all  ?Food Insecurity: No Food Insecurity  ? Worried About Charity fundraiser in the Last Year: Never true  ? Ran Out of Food in the Last Year: Never true  ?Transportation Needs: No Transportation Needs  ? Lack of  Transportation (Medical): No  ? Lack of Transportation (Non-Medical): No  ?Physical Activity: Inactive  ? Days of Exercise per Week: 0 days  ? Minutes of Exercise per Session: 0 min  ?Stress: No Stress Concern Present  ? Feeling of Stress : Not at all  ?Social Connections: Socially Isolated  ? Frequency of Communication with Friends and Family: More than three times a week  ? Frequency of Social Gatherings with Friends and Family: More than three times a week  ? Attends Religious Services: Never  ? Active Member of Clubs or Organizations: No  ? Attends Archivist Meetings: Never  ? Marital Status: Divorced  ?Intimate Partner Violence: Not At Risk  ? Fear of Current or Ex-Partner: No  ? Emotionally Abused: No  ? Physically Abused: No  ? Sexually Abused: No  ? ? ?Past  Surgical History:  ?Procedure Laterality Date  ? COLONOSCOPY W/ POLYPECTOMY  [phone]  ? last  colonoscopy 2007, Dr Carlean Purl  ? ESOPHAGOGASTRODUODENOSCOPY    ? SPINE SURGERY    ? T9-L5 fusions  ? ? ?Family History  ?Problem Relation Age of Onset  ? Breast cancer Mother   ? Breast cancer Sister   ? Breast cancer Maternal Aunt   ?     two  ? Breast cancer Paternal Aunt   ? Asthma Paternal Aunt   ? Heart failure Father   ?     CHF, CABG late 70s  ? Kidney disease Maternal Grandmother   ? Ovarian cancer Other   ?     Niece x2  ? Breast cancer Other   ?     niece  ? Coronary artery disease Paternal Aunt   ?     triple CABG  ? Heart attack Paternal Grandmother   ?     MI in late 15s  ? Heart disease Paternal Grandfather   ? Heart attack Paternal Uncle   ?      MI in 37s  ? Heart disease Maternal Aunt   ? Pulmonary embolism Son   ? Deep vein thrombosis Son   ? Heart Problems Son   ?     Heart attacks related to a "blood disorder"   ? Diabetes Neg Hx   ? Stroke Neg Hx   ? COPD Neg Hx   ? Colon polyps Neg Hx   ? ? ?Allergies  ?Allergen Reactions  ? Cephalosporins   ?  Blisters  orally  ? Gabapentin   ?  REACTION: rash  ? Levofloxacin   ?  REACTION: stomach ache and rash  ? Psyllium   ?  REACTION: rash  ? Sulfonamide Derivatives Anaphylaxis  ?  REACTION: shock, urticaria  ? Aspirin   ?  REACTION: stomach pain  ? Belladonna   ? Ciprofloxacin Hcl   ? Conjugated Estrogens   ? Doxycycline   ?  Abdominal pain  ? Flovent Hfa [Fluticasone]   ? Nabumetone   ? Nsaids   ? Soybean-Containing Drug Products Other (See Comments)  ?  GI upset  ? Zicam Cold Remedy [Homeopathic Products]   ? Zocor [Simvastatin] Itching  ? Nickel Itching and Rash  ? Prednisone Rash  ?  NO PROBLEM WITH MEDROL DOSE PAK ?Oral prednisone caused facial burning, "made my face feel like it was on fire"  ? ? ?Current Outpatient Medications on File Prior to Visit  ?Medication Sig Dispense Refill  ? acetaminophen (TYLENOL) 500 MG tablet Take 500 mg by mouth as  needed.    ?  albuterol (PROVENTIL) (2.5 MG/3ML) 0.083% nebulizer solution Take 3 mLs (2.5 mg total) by nebulization every 6 (six) hours as needed for wheezing or shortness of breath. 75 mL 12  ? albuterol (VENTOLIN HFA) 108 (90 Base) MCG/ACT inhaler Inhale 2 puffs into the lungs every 6 (six) hours as needed for wheezing or shortness of breath. As needed only 18 g 3  ? amLODipine (NORVASC) 2.5 MG tablet TAKE 1 TABLET(2.5 MG) BY MOUTH DAILY 90 tablet 1  ? bifidobacterium infantis (ALIGN) capsule Take 1 capsule by mouth daily. 21 capsule 0  ? carboxymethylcellulose (REFRESH PLUS) 0.5 % SOLN 1 drop 3 (three) times daily as needed.    ? cholecalciferol (VITAMIN D) 1000 units tablet Take 2,000 Units by mouth daily.    ? EPINEPHrine (EPIPEN 2-PAK) 0.3 mg/0.3 mL IJ SOAJ injection As directed 1 each 0  ? famotidine (PEPCID) 10 MG tablet Take 10 mg by mouth at bedtime. Alternating with '20mg'$  tablets she had    ? Flaxseed, Linseed, (GROUND FLAX SEEDS PO)     ? fluticasone (FLONASE) 50 MCG/ACT nasal spray USE 2 SPRAYS IN EACH  NOSTRIL ONCE A DAY 48 g 3  ? latanoprost (XALATAN) 0.005 % ophthalmic solution Place 1 drop into both eyes at bedtime.    ? metoprolol tartrate (LOPRESSOR) 25 MG tablet SCHEDULE OFFICE VISIT FOR FUTURE REFILLS 90 tablet 3  ? montelukast (SINGULAIR) 10 MG tablet TAKE 1 TABLET(10 MG) BY MOUTH AT BEDTIME 90 tablet 3  ? nystatin cream (MYCOSTATIN) Apply 1 application. topically 2 (two) times daily. 30 g 1  ? Polyethyl Glycol-Propyl Glycol (SYSTANE) 0.4-0.3 % GEL ophthalmic gel Place 1 application into both eyes.    ? sodium chloride (OCEAN) 0.65 % SOLN nasal spray Place 1 spray into both nostrils as needed for congestion.    ? triamcinolone (KENALOG) 0.1 % Apply 1 application topically 2 (two) times daily. 90 g 3  ? Vitamin Mixture (ESTER-C PO) Take 1,000 mg by mouth daily.    ? ?No current facility-administered medications on file prior to visit.  ? ? ?BP 138/60   Pulse 62   Resp 18   Ht '5\' 1"'$  (1.549 m)    Wt 149 lb 11.2 oz (67.9 kg)   SpO2 100%   BMI 28.29 kg/m?  ?  ?   ?Objective:  ? Physical Exam ? ?General Appearance- Not in acute distress. ? ?HEENT ?Eyes- Scleraeral/Conjuntiva-bilat- Not Yellow. ?Mouth & Throat

## 2022-01-13 ENCOUNTER — Emergency Department (HOSPITAL_BASED_OUTPATIENT_CLINIC_OR_DEPARTMENT_OTHER)
Admission: EM | Admit: 2022-01-13 | Discharge: 2022-01-13 | Disposition: A | Discharge status: Home / Self Care | Payer: Medicare Other | Attending: Emergency Medicine | Admitting: Emergency Medicine

## 2022-01-13 ENCOUNTER — Other Ambulatory Visit: Payer: Self-pay

## 2022-01-13 ENCOUNTER — Encounter (HOSPITAL_BASED_OUTPATIENT_CLINIC_OR_DEPARTMENT_OTHER): Payer: Self-pay | Admitting: Emergency Medicine

## 2022-01-13 DIAGNOSIS — R1110 Vomiting, unspecified: Secondary | ICD-10-CM | POA: Insufficient documentation

## 2022-01-13 DIAGNOSIS — T50995A Adverse effect of other drugs, medicaments and biological substances, initial encounter: Secondary | ICD-10-CM | POA: Diagnosis not present

## 2022-01-13 DIAGNOSIS — R197 Diarrhea, unspecified: Secondary | ICD-10-CM | POA: Diagnosis not present

## 2022-01-13 DIAGNOSIS — R42 Dizziness and giddiness: Secondary | ICD-10-CM | POA: Insufficient documentation

## 2022-01-13 DIAGNOSIS — T360X5A Adverse effect of penicillins, initial encounter: Secondary | ICD-10-CM | POA: Insufficient documentation

## 2022-01-13 DIAGNOSIS — T50905A Adverse effect of unspecified drugs, medicaments and biological substances, initial encounter: Secondary | ICD-10-CM

## 2022-01-13 LAB — CBC
HCT: 37.8 % (ref 36.0–46.0)
Hemoglobin: 12.5 g/dL (ref 12.0–15.0)
MCH: 30.6 pg (ref 26.0–34.0)
MCHC: 33.1 g/dL (ref 30.0–36.0)
MCV: 92.6 fL (ref 80.0–100.0)
Platelets: 155 10*3/uL (ref 150–400)
RBC: 4.08 MIL/uL (ref 3.87–5.11)
RDW: 13.6 % (ref 11.5–15.5)
WBC: 4.5 10*3/uL (ref 4.0–10.5)
nRBC: 0 % (ref 0.0–0.2)

## 2022-01-13 LAB — URINALYSIS, ROUTINE W REFLEX MICROSCOPIC
Bilirubin Urine: NEGATIVE
Glucose, UA: NEGATIVE mg/dL
Hgb urine dipstick: NEGATIVE
Ketones, ur: NEGATIVE mg/dL
Leukocytes,Ua: NEGATIVE
Nitrite: NEGATIVE
Protein, ur: NEGATIVE mg/dL
Specific Gravity, Urine: 1.01 (ref 1.005–1.030)
pH: 7 (ref 5.0–8.0)

## 2022-01-13 LAB — COMPREHENSIVE METABOLIC PANEL
ALT: 19 U/L (ref 0–44)
AST: 26 U/L (ref 15–41)
Albumin: 3.8 g/dL (ref 3.5–5.0)
Alkaline Phosphatase: 84 U/L (ref 38–126)
Anion gap: 6 (ref 5–15)
BUN: 12 mg/dL (ref 8–23)
CO2: 29 mmol/L (ref 22–32)
Calcium: 9.2 mg/dL (ref 8.9–10.3)
Chloride: 104 mmol/L (ref 98–111)
Creatinine, Ser: 0.63 mg/dL (ref 0.44–1.00)
GFR, Estimated: >60 mL/min (ref >60)
Glucose, Bld: 99 mg/dL (ref 70–99)
Potassium: 4.4 mmol/L (ref 3.5–5.1)
Sodium: 139 mmol/L (ref 135–145)
Total Bilirubin: 0.4 mg/dL (ref 0.3–1.2)
Total Protein: 6.5 g/dL (ref 6.5–8.1)

## 2022-01-13 LAB — MAGNESIUM: Magnesium: 1.9 mg/dL (ref 1.7–2.4)

## 2022-01-13 LAB — LIPASE, BLOOD: Lipase: 29 U/L (ref 11–51)

## 2022-01-13 MED ORDER — LACTATED RINGERS IV BOLUS: 1000.0000 mL | Freq: Once | INTRAVENOUS | Status: DC

## 2022-01-13 NOTE — Discharge Instructions (Addendum)
Our evaluation is reassuring.  There is no evidence of profound dehydration leading to electrolyte abnormalities, kidney dysfunction. ? ?Your potassium and sodium are both normal and urine test, white count did not show any signs of infection. ?

## 2022-01-13 NOTE — ED Notes (Signed)
Attempted to obtain IV x2 without success.  ?

## 2022-01-13 NOTE — ED Triage Notes (Signed)
Pt arrives pov, steady gait, c/o diarrhea. Was seen by PCP on Wednesday. Is receiving tx for hyperkalemia ?

## 2022-01-13 NOTE — ED Provider Notes (Signed)
?[facility] ?Provider Note ? ? ?CSN: [id number] ?Arrival date & time: 01/13/22  1124 ? ?  ? ?History ? ?Chief Complaint  ?Patient presents with  ? Diarrhea  ? ? ?Kayla [name] is a 76 y.o. female. ? ?HPI ? ?  ? ?76 year old female comes in with chief complaint of diarrhea. ?Patient has history of IBS, diverticulosis, pneumonia, diverticulitis.  She is currently on antibiotics for diverticulitis.  Patient indicates that she was seen by her PCP started on Augmentin and Kayexalate for hyperkalemia.  She was doing well until yesterday when she started having diarrhea.  Patient had about 5-10 episodes of loose stools.  She took some diarrhea medications today.  She had dizziness and lightheadedness when she got up to get shower, which was concerning to her and she decided to come to the ER.  Her last episode of emesis was around 1030 or 11 AM.  No blood in the stools.  Patient denies any fevers. ? ? ?Home Medications ?Prior to Admission medications   ?Medication Sig Start Date End Date Taking? Authorizing Provider  ?acetaminophen (TYLENOL) 500 MG tablet Take 500 mg by mouth as needed.    [provider]  ?albuterol (PROVENTIL) (2.5 MG/3ML) 0.083% nebulizer solution Take 3 mLs (2.5 mg total) by nebulization every 6 (six) hours as needed for wheezing or shortness of breath. 03/08/20   Carollee Herter, Alferd Apa, DO  ?albuterol (VENTOLIN HFA) 108 (90 Base) MCG/ACT inhaler Inhale 2 puffs into the lungs every 6 (six) hours as needed for wheezing or shortness of breath. As needed only 04/13/21   Carollee Herter, Kendrick Fries R, DO  ?amLODipine (NORVASC) 2.5 MG tablet TAKE 1 TABLET(2.5 MG) BY MOUTH DAILY 08/28/21   [name], DO  ?amoxicillin-clavulanate (AUGMENTIN) 875-125 MG tablet Take 1 tablet by mouth 2 (two) times daily. 01/10/22   Saguier, Percell Miller, PA-C  ?bifidobacterium infantis (ALIGN) capsule Take 1 capsule by mouth daily. 12/29/12   Gatha Mayer, MD  ?carboxymethylcellulose (REFRESH  PLUS) 0.5 % SOLN 1 drop 3 (three) times daily as needed.    [provider]  ?cholecalciferol (VITAMIN D) 1000 units tablet Take 2,000 Units by mouth daily. 12/24/16   [provider]  ?EPINEPHrine (EPIPEN 2-PAK) 0.3 mg/0.3 mL IJ SOAJ injection As directed 01/03/22   Carollee Herter, Alferd Apa, DO  ?famotidine (PEPCID) 10 MG tablet Take 10 mg by mouth at bedtime. Alternating with '20mg'$  tablets she had    [provider]  ?Flaxseed, Linseed, (GROUND FLAX SEEDS PO)  05/26/19   [provider]  ?fluticasone (FLONASE) 50 MCG/ACT nasal spray USE 2 SPRAYS IN EACH  NOSTRIL ONCE A DAY 03/27/19   [name], DO  ?hyoscyamine (LEVSIN SL) 0.125 MG SL tablet Place 1 tablet (0.125 mg total) under the tongue every 6 (six) hours as needed. 01/10/22   Saguier, Percell Miller, PA-C  ?latanoprost (XALATAN) 0.005 % ophthalmic solution Place 1 drop into both eyes at bedtime.    [provider]  ?metoprolol tartrate (LOPRESSOR) 25 MG tablet SCHEDULE OFFICE VISIT FOR FUTURE REFILLS 01/08/22   Minus Breeding, MD  ?metroNIDAZOLE (FLAGYL) 500 MG tablet Take 1 tablet (500 mg total) by mouth 3 (three) times daily for 7 days. 01/10/22 01/17/22  Saguier, Percell Miller, PA-C  ?montelukast (SINGULAIR) 10 MG tablet TAKE 1 TABLET(10 MG) BY MOUTH AT BEDTIME 10/18/21   Roma Schanz R, DO  ?nystatin cream (MYCOSTATIN) Apply 1 application. topically 2 (two) times daily. 12/11/21   Lowne  Koren Shiver, DO  ?Polyethyl Glycol-Propyl Glycol (SYSTANE) 0.4-0.3 % GEL ophthalmic gel Place 1 application into both eyes.    [provider]  ?sodium chloride (OCEAN) 0.65 % SOLN nasal spray Place 1 spray into both nostrils as needed for congestion.    [provider]  ?sodium polystyrene (KAYEXALATE) 15 GM/60ML suspension Take 60 mLs (15 grams total) by mouth daily for 4 days. 01/10/22   Saguier, Percell Miller, PA-C  ?triamcinolone (KENALOG) 0.1 % Apply 1 application topically 2 (two) times daily. 08/01/20   [name], DO  ?Vitamin Mixture (ESTER-C PO) Take 1,000 mg by mouth daily.    [provider]  ?   ? ?Allergies    ?Cephalosporins, Gabapentin, Levofloxacin, Psyllium, Sulfonamide derivatives, Aspirin, Belladonna, Ciprofloxacin hcl, Conjugated estrogens, Doxycycline, Flovent hfa [fluticasone], Nabumetone, Nsaids, Soybean-containing drug products, Zicam cold remedy [homeopathic products], Zocor [simvastatin], Nickel, and Prednisone   ? ?Review of Systems   ?Review of Systems  ?All other systems reviewed and are negative. ? ?Physical Exam ?Updated Vital Signs ?BP (!) 134/59   Pulse (!) 50   Temp 98 ?F (36.7 ?C) (Oral)   Resp 17   Ht '5\' 1"'$  (1.549 m)   Wt 66.7 kg   SpO2 97%   BMI 27.78 kg/m?  ?Physical Exam ?Vitals and nursing note reviewed.  ?Constitutional:   ?   Appearance: She is well-developed.  ?HENT:  ?   Head: Atraumatic.  ?Cardiovascular:  ?   Rate and Rhythm: Normal rate.  ?Pulmonary:  ?   Effort: Pulmonary effort is normal.  ?Abdominal:  ?   Tenderness: There is abdominal tenderness. There is no guarding or rebound.  ?Musculoskeletal:  ?   Cervical back: Normal range of motion and neck supple.  ?Skin: ?   General: Skin is warm and dry.  ?Neurological:  ?   Mental Status: She is alert and oriented to person, place, and time.  ? ? ?ED Results / Procedures / Treatments   ?Labs ?(all labs ordered are listed, but only abnormal results are displayed) ?Labs Reviewed  ?LIPASE, BLOOD  ?COMPREHENSIVE METABOLIC PANEL  ?CBC  ?URINALYSIS, ROUTINE W REFLEX MICROSCOPIC  ?MAGNESIUM  ? ? ?EKG ?None ? ?Radiology ?No results found. ? ?Procedures ?Procedures  ? ? ?Medications Ordered in ED ?Medications  ?lactated ringers bolus 1,000 mL (1,000 mLs Intravenous New Bag/Given 01/13/22 1423)  ? ? ?ED Course/ Medical Decision Making/ A&P ?  ?                        ?Medical Decision Making ?Amount and/or Complexity of Data Reviewed ?Labs: ordered. ? ? ?This patient presents to the ED with chief complaint(s) of diarrhea  with pertinent past medical history of diverticulitis on Augmentin and abnormal potassium on Kayexalate which further complicates the presenting complaint. The complaint involves an extensive differential diagnosis and treatment options and also carries with it a high risk of complications and morbidity.   ? ?The differential diagnosis includes : ?Diarrhea secondary to colitis, diarrhea secondary to medication side effect, dehydration and electrolyte abnormalities and renal failure secondary to diarrhea. ? ?The initial plan is to order basic blood work-up only. ?My suspicion is high that patient likely is having medication side effects.  Kayexalate can cause loose BM and also Augmentin ? ?Additional history obtained: ?Records reviewed Primary Care Documents -from 01-10-2022 ? ? ?Independent visualization of imaging: ?- Imaging that was independently visualized with scope of interpretation limited to determining acute  life threatening conditions related to emergency care: Visualized and interpreted CT scan from 01-10-2022, which revealed diverticulitis. ? ?Treatment and Reassessment: ?Patient's blood work-up is reassuring. ?Potassium, magnesium are normal.  No renal failure.  UA shows no ketones. ? ?The patient appears reasonably screened and/or stabilized for discharge and I doubt any other medical condition or other [facility] requiring further screening, evaluation, or treatment in the ED at this time prior to discharge. ?  ?Results from the ER workup discussed with the patient face to face and all questions answered to the best of my ability. ?The patient is safe for discharge with strict return precautions. ? ? ?Final Clinical Impression(s) / ED Diagnoses ?Final diagnoses:  ?Diarrhea, unspecified type  ?Medication side effect, initial encounter  ? ? ?Rx / DC Orders ?ED Discharge Orders   ? ? None  ? ?  ? ? ?  ?Varney Biles, MD ?01/13/22 1521 ? ?

## 2022-01-15 ENCOUNTER — Encounter: Payer: Self-pay | Admitting: Medical

## 2022-01-15 ENCOUNTER — Ambulatory Visit (INDEPENDENT_AMBULATORY_CARE_PROVIDER_SITE_OTHER): Payer: Medicare Other | Admitting: Medical

## 2022-01-15 ENCOUNTER — Telehealth: Payer: Self-pay

## 2022-01-15 VITALS — BP 126/80 | HR 58 | Temp 99.1°F | Resp 18 | Ht 61.0 in | Wt 152.0 lb

## 2022-01-15 DIAGNOSIS — K5792 Diverticulitis of intestine, part unspecified, without perforation or abscess without bleeding: Secondary | ICD-10-CM | POA: Diagnosis not present

## 2022-01-15 DIAGNOSIS — E875 Hyperkalemia: Secondary | ICD-10-CM | POA: Diagnosis not present

## 2022-01-15 NOTE — Telephone Encounter (Signed)
Nurse Assessment ?Nurse: Lavina Hamman, RN, Thomasena Edis Date/Time Eilene Ghazi Time): 01/13/2022 8:12:12 AM ?Confirm and document reason for call. If ?symptomatic, describe symptoms. ?---Caller states she was seen in the office 3 days ago ?and was diagnosed with diverticulitis. On abx. ?Does the patient have any new or worsening ?symptoms? ---Yes ?Will a triage be completed? ---Yes ?Related visit to physician within the last 2 weeks? ---Yes ?Does the PT have any chronic conditions? (i.e. ?diabetes, asthma, this includes High risk factors for ?pregnancy, etc.) ?---Yes ?List chronic conditions. ---Diverticulitis, asthma, HTN ?Is this a behavioral health or substance abuse call? ---No ?Guidelines ?Guideline Title Affirmed Question Affirmed Notes Nurse Date/Time (Eastern ?Time) ?Diarrhea on ?Antibiotics ?MODERATE ?diarrhea (e.g., 4-6 ?times / day more than ?normal) ?Lavina Hamman, RN, [place] 01/13/2022 8:12:59 AM ?Disp. Time (Eastern ?Time) Disposition Final User ?01/13/2022 8:20:56 AM See PCP within 24 Hours Yes Lavina Hamman, RN, Thomasena Edis ?PLEASE NOTE: All timestamps contained within this report are represented as [place] Standard Time. ?CONFIDENTIALTY NOTICE: This fax transmission is intended only for the addressee. It contains information that is legally privileged, confidential or ?otherwise protected from use or disclosure. If you are not the intended recipient, you are strictly prohibited from reviewing, disclosing, copying using ?or disseminating any of this information or taking any action in reliance on or regarding this information. If you have received this fax in error, please ?notify us immediately by telephone so that we can arrange for its return to [place]. Phone: [phone], Toll-Free: [phone], Fax: [phone] ?Page: 2 of 2 ?Call Id: [id number] ?Caller Disagree/Comply Comply ?Caller Understands Yes ?PreDisposition InappropriateToAsk ?Care Advice Given Per Guideline ?* Drink more fluids, at least 8 to 10 cups daily. One cup equals 8 oz (240  ml). * WATER: For mild to moderate diarrhea, water is ?often the best liquid to drink. You should also eat some salty foods (e.g., potato chips, pretzels, saltine crackers). This is important to ?make sure you are getting enough salt, sugars, and fluids to meet your body's needs. * SPORTS DRINKS: You can also drink halfstrength sports drinks (e.g., Gatorade, Powerade) to help treat and prevent dehydration. Mix the sports drink half and half with water. ?CALL BACK IF: * Signs of dehydration occur (e.g., no urine over 12 hours, very dry mouth, lightheaded, etc.) * Bloody stools * ?Constant or severe abdominal pain * You become worse CARE ADVICE given per Diarrhea on Antibiotics (Adult) guideline. ?Comments ?User: Farrel Gobble, RN Date/Time Eilene Ghazi Time): 01/13/2022 8:16:40 AM ?started this morning. ?Referrals ?[facility] [place] ?

## 2022-01-15 NOTE — Progress Notes (Signed)
? ?Subjective:  ? ? Patient ID: Kayla [name], female    DOB: [birth year], 76 y.o.   MRN: [id number] ? ?HPI ? ?Pt in for follow up. ? ?Pt states she feels a lot better. ? ?Pt recently dx with diverticulitis. Pt is on augmentin and flagyl. The Ct abd/pelvis showed. ? ?IMPRESSION: ?High-grade sigmoid diverticulosis with proximal to mid sigmoid focal ?acute diverticulitis. No perforation or abscess is seen. ? ?Pt got diarrhea on 3rd day of the antibiotic.  ? ?Pt also had high potasium and I rx'd kayexalate. Then she started with diarrhea. Was severe and pt was evaluated at the emergency dept. Pt told by ED the kayexalate probably caused the diarrhea. ? ?Pt was told to stop kayexelate in the ED. Her potassium is now normal.  ? ?Pt is drinking sugar free gatorade. ? ? ?ED A/P  on 01/13/22. ? ?ED Course/ Medical Decision Making/ A&P ?                        ?Medical Decision Making ?Amount and/or Complexity of Data Reviewed ?Labs: ordered. ?  ?  ?This patient presents to the ED with chief complaint(s) of diarrhea with pertinent past medical history of diverticulitis on Augmentin and abnormal potassium on Kayexalate which further complicates the presenting complaint. The complaint involves an extensive differential diagnosis and treatment options and also carries with it a high risk of complications and morbidity.   ?  ?The differential diagnosis includes : ?Diarrhea secondary to colitis, diarrhea secondary to medication side effect, dehydration and electrolyte abnormalities and renal failure secondary to diarrhea. ?  ?The initial plan is to order basic blood work-up only. ?My suspicion is high that patient likely is having medication side effects.  Kayexalate can cause loose BM and also Augmentin. ? ? ?Review of Systems  ?Constitutional:  Negative for chills, fatigue and fever.  ?Respiratory:  Negative for cough, chest tightness, shortness of breath and wheezing.   ?Cardiovascular:  Negative for chest pain and  palpitations.  ?Gastrointestinal:  Negative for abdominal pain, constipation and nausea.  ?     Diarrhea resolved.  ?Genitourinary:  Negative for dysuria, flank pain, frequency and hematuria.  ?Musculoskeletal:  Negative for back pain and joint swelling.  ?Skin:  Negative for rash.  ? ? ?Past Medical History:  ?Diagnosis Date  ? Abscess of left groin 11/10/2014  ? Asthma   ? Cellulitis 10/20/2014  ? Diverticulitis   ? Diverticulosis   ? Fasting hyperglycemia 04/2011  ? FBS 108  ? Fibromyalgia   ? Fundic gland polyps of stomach, benign   ? GERD (gastroesophageal reflux disease)   ? gastric polyp x3  ? Glaucoma   ?  Dr Bing Plume  ? IBS (irritable bowel syndrome)   ? Osteopenia   ? last 07/2011  ? Pneumonia   ?  OP as child  ? Shingles   ? ?  ?Social History  ? ?Socioeconomic History  ? Marital status: Married  ?  Spouse name: Not on file  ? Number of children: Not on file  ? Years of education: Not on file  ? Highest education level: Not on file  ?Occupational History  ? Occupation: retired  ?Tobacco Use  ? Smoking status: Former  ?  Packs/day: 0.50  ?  Years: 10.00  ?  Pack years: 5.00  ?  Types: Cigarettes  ?  Quit date: 09/10/1974  ?  Years since quitting: 47.3  ? Smokeless tobacco: Never  ?  Tobacco comments:  ?  smoked [place], up to 1 ppd  ?Substance and Sexual Activity  ? Alcohol use: No  ?  Alcohol/week: 0.0 standard drinks  ? Drug use: No  ? Sexual activity: Yes  ?Other Topics Concern  ? Not on file  ?Social History Narrative  ? Daily caffeine 3 cups  ? Regular exercise  ? Married  ?   ?   ? ?Social Determinants of Health  ? ?Financial Resource Strain: Low Risk   ? Difficulty of Paying Living Expenses: Not hard at all  ?Food Insecurity: No Food Insecurity  ? Worried About Charity fundraiser in the Last Year: Never true  ? Ran Out of Food in the Last Year: Never true  ?Transportation Needs: No Transportation Needs  ? Lack of Transportation (Medical): No  ? Lack of Transportation (Non-Medical): No  ?Physical  Activity: Inactive  ? Days of Exercise per Week: 0 days  ? Minutes of Exercise per Session: 0 min  ?Stress: No Stress Concern Present  ? Feeling of Stress : Not at all  ?Social Connections: Socially Isolated  ? Frequency of Communication with Friends and Family: More than three times a week  ? Frequency of Social Gatherings with Friends and Family: More than three times a week  ? Attends Religious Services: Never  ? Active Member of Clubs or Organizations: No  ? Attends Archivist Meetings: Never  ? Marital Status: Divorced  ?Intimate Partner Violence: Not At Risk  ? Fear of Current or Ex-Partner: No  ? Emotionally Abused: No  ? Physically Abused: No  ? Sexually Abused: No  ? ? ?Past Surgical History:  ?Procedure Laterality Date  ? COLONOSCOPY W/ POLYPECTOMY  [phone]  ? last  colonoscopy 2007, Dr Carlean Purl  ? ESOPHAGOGASTRODUODENOSCOPY    ? SPINE SURGERY    ? T9-L5 fusions  ? ? ?Family History  ?Problem Relation Age of Onset  ? Breast cancer Mother   ? Breast cancer Sister   ? Breast cancer Maternal Aunt   ?     two  ? Breast cancer Paternal Aunt   ? Asthma Paternal Aunt   ? Heart failure Father   ?     CHF, CABG late 70s  ? Kidney disease Maternal Grandmother   ? Ovarian cancer Other   ?     Niece x2  ? Breast cancer Other   ?     niece  ? Coronary artery disease Paternal Aunt   ?     triple CABG  ? Heart attack Paternal Grandmother   ?     MI in late 51s  ? Heart disease Paternal Grandfather   ? Heart attack Paternal Uncle   ?      MI in 49s  ? Heart disease Maternal Aunt   ? Pulmonary embolism Son   ? Deep vein thrombosis Son   ? Heart Problems Son   ?     Heart attacks related to a "blood disorder"   ? Diabetes Neg Hx   ? Stroke Neg Hx   ? COPD Neg Hx   ? Colon polyps Neg Hx   ? ? ?Allergies  ?Allergen Reactions  ? Cephalosporins   ?  Blisters  orally  ? Gabapentin   ?  REACTION: rash  ? Levofloxacin   ?  REACTION: stomach ache and rash  ? Psyllium   ?  REACTION: rash  ? Sulfonamide Derivatives  Anaphylaxis  ?  REACTION: shock,  urticaria  ? Aspirin   ?  REACTION: stomach pain  ? Belladonna   ? Ciprofloxacin Hcl   ? Conjugated Estrogens   ? Doxycycline   ?  Abdominal pain  ? Flovent Hfa [Fluticasone]   ? Nabumetone   ? Nsaids   ? Soybean-Containing Drug Products Other (See Comments)  ?  GI upset  ? Zicam Cold Remedy [Homeopathic Products]   ? Zocor [Simvastatin] Itching  ? Nickel Itching and Rash  ? Prednisone Rash  ?  NO PROBLEM WITH MEDROL DOSE PAK ?Oral prednisone caused facial burning, "made my face feel like it was on fire"  ? ? ?Current Outpatient Medications on File Prior to Visit  ?Medication Sig Dispense Refill  ? acetaminophen (TYLENOL) 500 MG tablet Take 500 mg by mouth as needed.    ? albuterol (PROVENTIL) (2.5 MG/3ML) 0.083% nebulizer solution Take 3 mLs (2.5 mg total) by nebulization every 6 (six) hours as needed for wheezing or shortness of breath. 75 mL 12  ? albuterol (VENTOLIN HFA) 108 (90 Base) MCG/ACT inhaler Inhale 2 puffs into the lungs every 6 (six) hours as needed for wheezing or shortness of breath. As needed only 18 g 3  ? amLODipine (NORVASC) 2.5 MG tablet TAKE 1 TABLET(2.5 MG) BY MOUTH DAILY 90 tablet 1  ? amoxicillin-clavulanate (AUGMENTIN) 875-125 MG tablet Take 1 tablet by mouth 2 (two) times daily. 20 tablet 0  ? bifidobacterium infantis (ALIGN) capsule Take 1 capsule by mouth daily. 21 capsule 0  ? carboxymethylcellulose (REFRESH PLUS) 0.5 % SOLN 1 drop 3 (three) times daily as needed.    ? cholecalciferol (VITAMIN D) 1000 units tablet Take 2,000 Units by mouth daily.    ? EPINEPHrine (EPIPEN 2-PAK) 0.3 mg/0.3 mL IJ SOAJ injection As directed 1 each 0  ? famotidine (PEPCID) 10 MG tablet Take 10 mg by mouth at bedtime. Alternating with '20mg'$  tablets she had    ? Flaxseed, Linseed, (GROUND FLAX SEEDS PO)     ? fluticasone (FLONASE) 50 MCG/ACT nasal spray USE 2 SPRAYS IN EACH  NOSTRIL ONCE A DAY 48 g 3  ? hyoscyamine (LEVSIN SL) 0.125 MG SL tablet Place 1 tablet (0.125 mg total)  under the tongue every 6 (six) hours as needed. 10 tablet 1  ? latanoprost (XALATAN) 0.005 % ophthalmic solution Place 1 drop into both eyes at bedtime.    ? metoprolol tartrate (LOPRESSOR) 25 MG tablet SCHEDULE OFFICE VI

## 2022-01-15 NOTE — Patient Instructions (Addendum)
Diverticulitis. Clinically improved with augmentin and flagyl.  ? ?Diarrhea better and appears most likely was caused by kayexalate. Antibiotics can cause diarrhea but benefit exceeds risk. DC kayexalate presently.  ? ?Please use probiotics while on antibiotic. ? ?Potassium is not back in normal. ? ?On review clinically improved. Labs 2 days ago stable/good. Not indicated to recheck labs today. ? ?Stay well hydrated. ? ? ?Follow up 2 weeks or sooner if needed. ? ? ? ? ?

## 2022-01-26 ENCOUNTER — Ambulatory Visit: Payer: Medicare Other | Admitting: Internal Medicine

## 2022-01-26 ENCOUNTER — Encounter: Payer: Self-pay | Admitting: Internal Medicine

## 2022-01-26 ENCOUNTER — Other Ambulatory Visit (INDEPENDENT_AMBULATORY_CARE_PROVIDER_SITE_OTHER): Payer: Medicare Other

## 2022-01-26 VITALS — BP 124/64 | HR 53 | Ht 60.75 in | Wt 149.0 lb

## 2022-01-26 DIAGNOSIS — K589 Irritable bowel syndrome without diarrhea: Secondary | ICD-10-CM

## 2022-01-26 DIAGNOSIS — E875 Hyperkalemia: Secondary | ICD-10-CM

## 2022-01-26 DIAGNOSIS — K5732 Diverticulitis of large intestine without perforation or abscess without bleeding: Secondary | ICD-10-CM

## 2022-01-26 DIAGNOSIS — E785 Hyperlipidemia, unspecified: Secondary | ICD-10-CM

## 2022-01-26 LAB — COMPREHENSIVE METABOLIC PANEL
ALT: 18 U/L (ref 0–35)
AST: 20 U/L (ref 0–37)
Albumin: 4.4 g/dL (ref 3.5–5.2)
Alkaline Phosphatase: 94 U/L (ref 39–117)
BUN: 14 mg/dL (ref 6–23)
CO2: 30 mEq/L (ref 19–32)
Calcium: 10 mg/dL (ref 8.4–10.5)
Chloride: 103 mEq/L (ref 96–112)
Creatinine, Ser: 0.67 mg/dL (ref 0.40–1.20)
GFR: 84.92 mL/min (ref >60.00)
Glucose, Bld: 90 mg/dL (ref 70–99)
Potassium: 4.9 mEq/L (ref 3.5–5.1)
Sodium: 139 mEq/L (ref 135–145)
Total Bilirubin: 0.6 mg/dL (ref 0.2–1.2)
Total Protein: 7.3 g/dL (ref 6.0–8.3)

## 2022-01-26 LAB — LIPID PANEL
Cholesterol: 245 mg/dL — ABNORMAL HIGH (ref 0–200)
HDL: 63.6 mg/dL (ref >39.00)
LDL Cholesterol: 159 mg/dL — ABNORMAL HIGH (ref 0–99)
NonHDL: 181.84
Total CHOL/HDL Ratio: 4
Triglycerides: 115 mg/dL (ref 0.0–149.0)
VLDL: 23 mg/dL (ref 0.0–40.0)

## 2022-01-26 NOTE — Progress Notes (Signed)
Kayla [name] 76 y.o. 1946-08-03 [id number]  Assessment & Plan:   Encounter Diagnoses  Name Primary?   Diverticulitis of colon Yes   Hyperkalemia    Irritable bowel syndrome, unspecified type     This is her third or fourth episode of diverticulitis.  We think the last time was in 2009.  Given the interval since last colonoscopy it is reasonable to repeat one to rule out any lesions that might of triggered this though it is probably just recurrent diverticulitis and no neoplasia.  However she is also described variable bowel movements over the last few years and she has some concern about that which perhaps amplifies the need for colonoscopy.  The risks and benefits as well as alternatives of endoscopic procedure(s) have been discussed and reviewed. All questions answered. The patient agrees to proceed.   I explained how in some cases with recurrent diverticulitis elective segmental resection is performed.  Given her age and overall situation would try to avoid that we will see how things go.  Not clear why she was hyperkalemic, she does have follow-up planned (needs to call) with her PCP but we will go ahead and recheck her potassium for reassurance today.  CC: [name], DO   Subjective:   Chief Complaint:  HPI 76 year old white woman with a history of GERD and prior diverticulitis (2009 until recently), and IBS who is here for follow-up after recent diagnosis of diverticulitis earlier this month.  I have reviewed the CT scan from 01/10/2022 which showed sigmoid diverticulitis.  Last colonoscopy in 2017 with severe sigmoid diverticulosis with no plans for routine repeat.  She had actually started out with a visit to primary care and CT was ordered labs were ordered and she was hyperkalemic and slightly hyponatremic and was treated with Kayexalate and sorbitol.  CT demonstrated diverticulitis, she was started on Augmentin she had diarrhea problems from the Kayexalate  and sorbitol she was seen in the ER where she was evaluated and her potassium was normal and the Kayexalate was discontinued.  She remains fearful of eating because she is concerned about whether her potassium level would go up again she says it has been high normal in the past.  She is not on any medications that contribute to the hyperkalemia that I can tell.  She does feel like the diverticulitis is resolved.    Social history notable for husband passing away in April.  She is still grieving.   Wt Readings from Last 3 Encounters:  01/26/22 149 lb (67.6 kg)  01/15/22 152 lb (68.9 kg)  01/13/22 147 lb (66.7 kg)       Allergies  Allergen Reactions   Cephalosporins     Blisters  orally   Gabapentin     REACTION: rash   Levofloxacin     REACTION: stomach ache and rash   Psyllium     REACTION: rash   Sulfonamide Derivatives Anaphylaxis    REACTION: shock, urticaria   Aspirin     REACTION: stomach pain   Belladonna    Ciprofloxacin Hcl    Conjugated Estrogens    Doxycycline     Abdominal pain   Flovent Hfa [Fluticasone]    Nabumetone    Nsaids    Soybean-Containing Drug Products Other (See Comments)    GI upset   Zicam Cold Remedy [Homeopathic Products]    Zocor [Simvastatin] Itching   Nickel Itching and Rash   Prednisone Rash    NO PROBLEM WITH  MEDROL DOSE PAK Oral prednisone caused facial burning, "made my face feel like it was on fire"   Current Meds  Medication Sig   acetaminophen (TYLENOL) 500 MG tablet Take 500 mg by mouth as needed.   albuterol (PROVENTIL) (2.5 MG/3ML) 0.083% nebulizer solution Take 3 mLs (2.5 mg total) by nebulization every 6 (six) hours as needed for wheezing or shortness of breath.   albuterol (VENTOLIN HFA) 108 (90 Base) MCG/ACT inhaler Inhale 2 puffs into the lungs every 6 (six) hours as needed for wheezing or shortness of breath. As needed only   amLODipine (NORVASC) 2.5 MG tablet TAKE 1 TABLET(2.5 MG) BY MOUTH DAILY   bifidobacterium  infantis (ALIGN) capsule Take 1 capsule by mouth daily.   Calcium-Magnesium-Vitamin D (CALCIUM MAGNESIUM PO) Take 2 tablets by mouth daily.   carboxymethylcellulose (REFRESH PLUS) 0.5 % SOLN 1 drop 3 (three) times daily as needed.   cholecalciferol (VITAMIN D) 1000 units tablet Take 2,000 Units by mouth daily.   EPINEPHrine (EPIPEN 2-PAK) 0.3 mg/0.3 mL IJ SOAJ injection As directed   Esomeprazole Magnesium (NEXIUM 24HR PO) Take 1 capsule by mouth as needed.   famotidine (PEPCID) 20 MG tablet Take 20 mg by mouth daily.   Flaxseed, Linseed, (GROUND FLAX SEEDS PO)    fluticasone (FLONASE) 50 MCG/ACT nasal spray USE 2 SPRAYS IN EACH  NOSTRIL ONCE A DAY   hyoscyamine (LEVSIN SL) 0.125 MG SL tablet Place 1 tablet (0.125 mg total) under the tongue every 6 (six) hours as needed.   latanoprost (XALATAN) 0.005 % ophthalmic solution Place 1 drop into both eyes at bedtime.   metoprolol tartrate (LOPRESSOR) 25 MG tablet SCHEDULE OFFICE VISIT FOR FUTURE REFILLS   montelukast (SINGULAIR) 10 MG tablet TAKE 1 TABLET(10 MG) BY MOUTH AT BEDTIME   nystatin cream (MYCOSTATIN) Apply 1 application. topically 2 (two) times daily.   Polyethyl Glycol-Propyl Glycol (SYSTANE) 0.4-0.3 % GEL ophthalmic gel Place 1 application into both eyes.   sodium chloride (OCEAN) 0.65 % SOLN nasal spray Place 1 spray into both nostrils as needed for congestion.   triamcinolone (KENALOG) 0.1 % Apply 1 application topically 2 (two) times daily.   Vitamin Mixture (ESTER-C PO) Take 1,000 mg by mouth daily.   Past Medical History:  Diagnosis Date   Abscess of left groin 11/10/2014   Asthma    Cellulitis 10/20/2014   Diverticulitis    Diverticulosis    Fasting hyperglycemia 04/2011   FBS 108   Fibromyalgia    Fundic gland polyps of stomach, benign    GERD (gastroesophageal reflux disease)    gastric polyp x3   Glaucoma     Dr Bing Plume   IBS (irritable bowel syndrome)    Osteopenia    last 07/2011   Pneumonia     OP as child    Shingles    Past Surgical History:  Procedure Laterality Date   COLONOSCOPY W/ POLYPECTOMY  1993,2002,2007   last  colonoscopy 2007, Dr Carlean Purl   ESOPHAGOGASTRODUODENOSCOPY     SPINE SURGERY     T9-L5 fusions   Social History   Social History Narrative   Daily caffeine 3 cups   Regular exercise   Married         family history includes Asthma in her paternal aunt; Breast cancer in her maternal aunt, mother, paternal aunt, sister, and another family member; Coronary artery disease in her paternal aunt; Deep vein thrombosis in her son; Heart Problems in her son; Heart attack in her paternal grandmother and  paternal uncle; Heart disease in her maternal aunt and paternal grandfather; Heart failure in her father; Kidney disease in her maternal grandmother; Ovarian cancer in an other family member; Pulmonary embolism in her son.   Review of Systems See HPI  Objective:   Physical Exam BP 124/64   Pulse (!) 53   Ht 5' 0.75" (1.543 m) Comment: height measured without shoes  Wt 149 lb (67.6 kg)   BMI 28.39 kg/m  NAD Lungs cta Cor NL S1S2 no rmg Abd soft, slightly touch but nontender or no mass

## 2022-01-26 NOTE — Patient Instructions (Signed)
You have been scheduled for a colonoscopy. Please follow written instructions given to you at your visit today.  Please pick up your prep supplies at the pharmacy within the next 1-3 days. If you use inhalers (even only as needed), please bring them with you on the day of your procedure.  Your provider has requested that you go to the basement level for lab work before leaving today. Press "B" on the elevator. The lab is located at the first door on the left as you exit the elevator.  Due to recent changes in healthcare laws, you may see the results of your imaging and laboratory studies on MyChart before your provider has had a chance to review them.  We understand that in some cases there may be results that are confusing or concerning to you. Not all laboratory results come back in the same time frame and the provider may be waiting for multiple results in order to interpret others.  Please give us 48 hours in order for your provider to thoroughly review all the results before contacting the office for clarification of your results.   I appreciate the opportunity to care for you. Carl Gessner, MD, FACG 

## 2022-01-29 ENCOUNTER — Encounter: Payer: Self-pay | Admitting: Family Medicine

## 2022-01-29 ENCOUNTER — Other Ambulatory Visit: Payer: Self-pay | Admitting: Family Medicine

## 2022-01-29 ENCOUNTER — Ambulatory Visit: Payer: Medicare Other | Admitting: Medical

## 2022-01-29 DIAGNOSIS — E785 Hyperlipidemia, unspecified: Secondary | ICD-10-CM

## 2022-02-06 ENCOUNTER — Ambulatory Visit (INDEPENDENT_AMBULATORY_CARE_PROVIDER_SITE_OTHER): Payer: Medicare Other

## 2022-02-06 VITALS — BP 157/79 | HR 60 | Temp 98.6°F | Resp 16 | Ht 60.0 in | Wt 149.0 lb

## 2022-02-06 DIAGNOSIS — Z0000 Encounter for general adult medical examination without abnormal findings: Secondary | ICD-10-CM | POA: Diagnosis not present

## 2022-02-06 NOTE — Patient Instructions (Addendum)
Kayla [name] , Thank you for taking time to come for your Medicare Wellness Visit. I appreciate your ongoing commitment to your health goals. Please review the following plan we discussed and let me know if I can assist you in the future.   Screening recommendations/referrals: Colonoscopy: no longer needed Mammogram: 07/07/21 due 07/07/22 Bone Density: 07/07/21 due 07/08/23 Recommended yearly ophthalmology/optometry visit for glaucoma screening and checkup Recommended yearly dental visit for hygiene and checkup  Vaccinations: Influenza vaccine: up to date Pneumococcal vaccine: up to date Tdap vaccine: up to date Shingles vaccine: up to date   Covid-19:declined  Advanced directives: yes, not on file  Conditions/risks identified: see problem list   Next appointment: Follow up in one year for your annual wellness visit    Preventive Care 77 Years and Older, Female Preventive care refers to lifestyle choices and visits with your health care provider that can promote health and wellness. What does preventive care include? A yearly physical exam. This is also called an annual well check. Dental exams once or twice a year. Routine eye exams. Ask your health care provider how often you should have your eyes checked. Personal lifestyle choices, including: Daily care of your teeth and gums. Regular physical activity. Eating a healthy diet. Avoiding tobacco and drug use. Limiting alcohol use. Practicing safe sex. Taking low-dose aspirin every day. Taking vitamin and mineral supplements as recommended by your health care provider. What happens during an annual well check? The services and screenings done by your health care provider during your annual well check will depend on your age, overall health, lifestyle risk factors, and family history of disease. Counseling  Your health care provider may ask you questions about your: Alcohol use. Tobacco use. Drug use. Emotional  well-being. Home and relationship well-being. Sexual activity. Eating habits. History of falls. Memory and ability to understand (cognition). Work and work Statistician. Reproductive health. Screening  You may have the following tests or measurements: Height, weight, and BMI. Blood pressure. Lipid and cholesterol levels. These may be checked every 5 years, or more frequently if you are over 55 years old. Skin check. Lung cancer screening. You may have this screening every year starting at age 29 if you have a 30-pack-year history of smoking and currently smoke or have quit within the past 15 years. Fecal occult blood test (FOBT) of the stool. You may have this test every year starting at age 70. Flexible sigmoidoscopy or colonoscopy. You may have a sigmoidoscopy every 5 years or a colonoscopy every 10 years starting at age 37. Hepatitis C blood test. Hepatitis B blood test. Sexually transmitted disease (STD) testing. Diabetes screening. This is done by checking your blood sugar (glucose) after you have not eaten for a while (fasting). You may have this done every 1-3 years. Bone density scan. This is done to screen for osteoporosis. You may have this done starting at age 34. Mammogram. This may be done every 1-2 years. Talk to your health care provider about how often you should have regular mammograms. Talk with your health care provider about your test results, treatment options, and if necessary, the need for more tests. Vaccines  Your health care provider may recommend certain vaccines, such as: Influenza vaccine. This is recommended every year. Tetanus, diphtheria, and acellular pertussis (Tdap, Td) vaccine. You may need a Td booster every 10 years. Zoster vaccine. You may need this after age 62. Pneumococcal 13-valent conjugate (PCV13) vaccine. One dose is recommended after age 71. Pneumococcal polysaccharide (PPSV23)  vaccine. One dose is recommended after age 4. Talk to your  health care provider about which screenings and vaccines you need and how often you need them. This information is not intended to replace advice given to you by your health care provider. Make sure you discuss any questions you have with your health care provider. Document Released: 09/23/2015 Document Revised: 05/16/2016 Document Reviewed: 06/28/2015 Elsevier Interactive Patient Education  2017 [place] Prevention in the Home Falls can cause injuries. They can happen to people of all ages. There are many things you can do to make your home safe and to help prevent falls. What can I do on the outside of my home? Regularly fix the edges of walkways and driveways and fix any cracks. Remove anything that might make you trip as you walk through a door, such as a raised step or threshold. Trim any bushes or trees on the path to your home. Use bright outdoor lighting. Clear any walking paths of anything that might make someone trip, such as rocks or tools. Regularly check to see if handrails are loose or broken. Make sure that both sides of any steps have handrails. Any raised decks and porches should have guardrails on the edges. Have any leaves, snow, or ice cleared regularly. Use sand or salt on walking paths during winter. Clean up any spills in your garage right away. This includes oil or grease spills. What can I do in the bathroom? Use night lights. Install grab bars by the toilet and in the tub and shower. Do not use towel bars as grab bars. Use non-skid mats or decals in the tub or shower. If you need to sit down in the shower, use a plastic, non-slip stool. Keep the floor dry. Clean up any water that spills on the floor as soon as it happens. Remove soap buildup in the tub or shower regularly. Attach bath mats securely with double-sided non-slip rug tape. Do not have throw rugs and other things on the floor that can make you trip. What can I do in the bedroom? Use night  lights. Make sure that you have a light by your bed that is easy to reach. Do not use any sheets or blankets that are too big for your bed. They should not hang down onto the floor. Have a firm chair that has side arms. You can use this for support while you get dressed. Do not have throw rugs and other things on the floor that can make you trip. What can I do in the kitchen? Clean up any spills right away. Avoid walking on wet floors. Keep items that you use a lot in easy-to-reach places. If you need to reach something above you, use a strong step stool that has a grab bar. Keep electrical cords out of the way. Do not use floor polish or wax that makes floors slippery. If you must use wax, use non-skid floor wax. Do not have throw rugs and other things on the floor that can make you trip. What can I do with my stairs? Do not leave any items on the stairs. Make sure that there are handrails on both sides of the stairs and use them. Fix handrails that are broken or loose. Make sure that handrails are as long as the stairways. Check any carpeting to make sure that it is firmly attached to the stairs. Fix any carpet that is loose or worn. Avoid having throw rugs at the top or bottom  of the stairs. If you do have throw rugs, attach them to the floor with carpet tape. Make sure that you have a light switch at the top of the stairs and the bottom of the stairs. If you do not have them, ask someone to add them for you. What else can I do to help prevent falls? Wear shoes that: Do not have high heels. Have rubber bottoms. Are comfortable and fit you well. Are closed at the toe. Do not wear sandals. If you use a stepladder: Make sure that it is fully opened. Do not climb a closed stepladder. Make sure that both sides of the stepladder are locked into place. Ask someone to hold it for you, if possible. Clearly mark and make sure that you can see: Any grab bars or handrails. First and last  steps. Where the edge of each step is. Use tools that help you move around (mobility aids) if they are needed. These include: Canes. Walkers. Scooters. Crutches. Turn on the lights when you go into a dark area. Replace any light bulbs as soon as they burn out. Set up your furniture so you have a clear path. Avoid moving your furniture around. If any of your floors are uneven, fix them. If there are any pets around you, be aware of where they are. Review your medicines with your doctor. Some medicines can make you feel dizzy. This can increase your chance of falling. Ask your doctor what other things that you can do to help prevent falls. This information is not intended to replace advice given to you by your health care provider. Make sure you discuss any questions you have with your health care provider. Document Released: 06/23/2009 Document Revised: 02/02/2016 Document Reviewed: 10/01/2014 Elsevier Interactive Patient Education  2017 [employer].

## 2022-02-06 NOTE — Progress Notes (Addendum)
Subjective:   Kayla [name] is a 76 y.o. female who presents for Medicare Annual (Subsequent) preventive examination.  Review of Systems     Cardiac Risk Factors include: advanced age (>47mn, >>70women);hypertension;dyslipidemia     Objective:    Today's Vitals   02/06/22 1547  BP: (!) 157/79  Pulse: 60  Resp: 16  Temp: 98.6 F (37 C)  SpO2: 100%  Weight: 149 lb (67.6 kg)  Height: 5' (1.524 m)   Body mass index is 29.1 kg/m.     02/06/2022    4:04 PM 01/13/2022   11:46 AM 02/02/2021   12:47 PM 08/02/2020    6:22 PM 03/05/2020    8:03 PM 01/05/2020   11:23 AM 04/13/2019    2:49 PM  Advanced Directives  Does Patient Have a Medical Advance Directive? Yes Yes Yes No Yes Yes No  Type of AParamedicof AMacdoelOut of facility DNR (pink MOST or yellow form);Living will  HP[place]aroLiving will   HAveryLiving will   Does patient want to make changes to medical advance directive? No - Patient declined     No - Patient declined   Copy of HEastonin Chart? No - copy requested  No - copy requested   No - copy requested   Would patient like information on creating a medical advance directive?       No - Patient declined    Current Medications (verified) Outpatient Encounter Medications as of 02/06/2022  Medication Sig   acetaminophen (TYLENOL) 500 MG tablet Take 500 mg by mouth as needed.   albuterol (PROVENTIL) (2.5 MG/3ML) 0.083% nebulizer solution Take 3 mLs (2.5 mg total) by nebulization every 6 (six) hours as needed for wheezing or shortness of breath.   albuterol (VENTOLIN HFA) 108 (90 Base) MCG/ACT inhaler Inhale 2 puffs into the lungs every 6 (six) hours as needed for wheezing or shortness of breath. As needed only   amLODipine (NORVASC) 2.5 MG tablet TAKE 1 TABLET(2.5 MG) BY MOUTH DAILY   bifidobacterium infantis (ALIGN) capsule Take 1 capsule by mouth daily.   Calcium-Magnesium-Vitamin D  (CALCIUM MAGNESIUM PO) Take 2 tablets by mouth daily.   carboxymethylcellulose (REFRESH PLUS) 0.5 % SOLN 1 drop 3 (three) times daily as needed.   cholecalciferol (VITAMIN D) 1000 units tablet Take 2,000 Units by mouth daily.   EPINEPHrine (EPIPEN 2-PAK) 0.3 mg/0.3 mL IJ SOAJ injection As directed   Esomeprazole Magnesium (NEXIUM 24HR PO) Take 1 capsule by mouth as needed.   famotidine (PEPCID) 20 MG tablet Take 20 mg by mouth daily.   Flaxseed, Linseed, (GROUND FLAX SEEDS PO)    fluticasone (FLONASE) 50 MCG/ACT nasal spray USE 2 SPRAYS IN EACH  NOSTRIL ONCE A DAY   hyoscyamine (LEVSIN SL) 0.125 MG SL tablet Place 1 tablet (0.125 mg total) under the tongue every 6 (six) hours as needed.   latanoprost (XALATAN) 0.005 % ophthalmic solution Place 1 drop into both eyes at bedtime.   metoprolol tartrate (LOPRESSOR) 25 MG tablet SCHEDULE OFFICE VISIT FOR FUTURE REFILLS   montelukast (SINGULAIR) 10 MG tablet TAKE 1 TABLET(10 MG) BY MOUTH AT BEDTIME   nystatin cream (MYCOSTATIN) Apply 1 application. topically 2 (two) times daily.   Polyethyl Glycol-Propyl Glycol (SYSTANE) 0.4-0.3 % GEL ophthalmic gel Place 1 application into both eyes.   sodium chloride (OCEAN) 0.65 % SOLN nasal spray Place 1 spray into both nostrils as needed for congestion.   triamcinolone (  KENALOG) 0.1 % Apply 1 application topically 2 (two) times daily.   Vitamin Mixture (ESTER-C PO) Take 1,000 mg by mouth daily.   No facility-administered encounter medications on file as of 02/06/2022.    Allergies (verified) Cephalosporins, Gabapentin, Levofloxacin, Psyllium, Sulfonamide derivatives, Aspirin, Belladonna, Ciprofloxacin hcl, Conjugated estrogens, Doxycycline, Flovent hfa [fluticasone], Nabumetone, Nsaids, Soybean-containing drug products, Zicam cold remedy [homeopathic products], Zocor [simvastatin], Nickel, and Prednisone   History: Past Medical History:  Diagnosis Date   Abscess of left groin 11/10/2014   Asthma    Cellulitis  10/20/2014   Diverticulitis    Diverticulosis    Fasting hyperglycemia 04/2011   FBS 108   Fibromyalgia    Fundic gland polyps of stomach, benign    GERD (gastroesophageal reflux disease)    gastric polyp x3   Glaucoma     Dr Bing Plume   IBS (irritable bowel syndrome)    Osteopenia    last 07/2011   Pneumonia     OP as child   Shingles    Past Surgical History:  Procedure Laterality Date   COLONOSCOPY W/ POLYPECTOMY  1993,2002,2007   last  colonoscopy 2007, Dr Carlean Purl   ESOPHAGOGASTRODUODENOSCOPY     SPINE SURGERY     T9-L5 fusions   Family History  Problem Relation Age of Onset   Breast cancer Mother    Breast cancer Sister    Breast cancer Maternal Aunt        two   Breast cancer Paternal Aunt    Asthma Paternal Aunt    Heart failure Father        CHF, CABG late 62s   Kidney disease Maternal Grandmother    Ovarian cancer Other        Niece x2   Breast cancer Other        niece   Coronary artery disease Paternal Aunt        triple CABG   Heart attack Paternal Grandmother        MI in late 35s   Heart disease Paternal Grandfather    Heart attack Paternal Uncle         MI in 68s   Heart disease Maternal Aunt    Pulmonary embolism Son    Deep vein thrombosis Son    Heart Problems Son        Heart attacks related to a "blood disorder"    Diabetes Neg Hx    Stroke Neg Hx    COPD Neg Hx    Colon polyps Neg Hx    Social History   Socioeconomic History   Marital status: Married    Spouse name: Not on file   Number of children: Not on file   Years of education: Not on file   Highest education level: Not on file  Occupational History   Occupation: retired  Tobacco Use   Smoking status: Former    Packs/day: 0.50    Years: 10.00    Pack years: 5.00    Types: Cigarettes    Quit date: 09/10/1974    Years since quitting: 47.4   Smokeless tobacco: Never   Tobacco comments:    smoked 1966- 1976, up to 1 ppd  Substance and Sexual Activity   Alcohol use: No     Alcohol/week: 0.0 standard drinks   Drug use: No   Sexual activity: Yes  Other Topics Concern   Not on file  Social History Narrative   Daily caffeine 3 cups   Regular exercise  Married         Social Determinants of Radio broadcast assistant Strain: Low Risk    Difficulty of Paying Living Expenses: Not hard at all  Food Insecurity: No Food Insecurity   Worried About Charity fundraiser in the Last Year: Never true   Arboriculturist in the Last Year: Never true  Transportation Needs: No Transportation Needs   Lack of Transportation (Medical): No   Lack of Transportation (Non-Medical): No  Physical Activity: Sufficiently Active   Days of Exercise per Week: 7 days   Minutes of Exercise per Session: 30 min  Stress: No Stress Concern Present   Feeling of Stress : Only a little  Social Connections: Moderately Isolated   Frequency of Communication with Friends and Family: More than three times a week   Frequency of Social Gatherings with Friends and Family: More than three times a week   Attends Religious Services: More than 4 times per year   Active Member of [employer] or Organizations: No   Attends Archivist Meetings: Never   Marital Status: Widowed    Tobacco Counseling Counseling given: Not Answered Tobacco comments: smoked 1966- 1976, up to 1 ppd   Clinical Intake:  Pre-visit preparation completed: Yes  Pain : No/denies pain     Nutritional Risks: None Diabetes: No  How often do you need to have someone help you when you read instructions, pamphlets, or other written materials from your doctor or pharmacy?: 1 - Never  Diabetic?No  Interpreter Needed?: No  Information entered by :: [place] of Daily Living    02/06/2022    4:24 PM  In your present state of health, do you have any difficulty performing the following activities:  Hearing? 0  Vision? 0  Difficulty concentrating or making decisions? 0  Walking or climbing  stairs? 0  Dressing or bathing? 0  Doing errands, shopping? 0  Preparing Food and eating ? N  Using the Toilet? N  In the past six months, have you accidently leaked urine? Y  Comment at night  Do you have problems with loss of bowel control? N  Managing your Medications? N  Managing your Finances? N  Housekeeping or managing your Housekeeping? N    Patient Care Team: Carollee Herter, Alferd Apa, DO as PCP - General (Family Medicine) Gatha Mayer, MD as Consulting Physician (Gastroenterology) Calvert Cantor, MD as Consulting Physician (Ophthalmology) Beshears, Dorie Rank, DMD (Dentistry) Minus Breeding, MD as Consulting Physician (Cardiology) Izora Gala, MD as Consulting Physician (Otolaryngology) Lewanda Rife, Bennie Pierini., DDS as Consulting Physician (Oral Surgery) Gavin Pound, MD as Consulting Physician (Rheumatology) Philemon Kingdom, MD as Consulting Physician (Endocrinology) Ulla Gallo, MD as Consulting Physician (Dermatology)  Indicate any recent Medical Services you may have received from other than [facility] providers in the past year (date may be approximate).     Assessment:   This is a routine wellness examination for Amarissa.  Hearing/Vision screen No results found.  Dietary issues and exercise activities discussed: Current Exercise Habits: Home exercise routine, Type of exercise: walking, Time (Minutes): 30, Frequency (Times/Week): 7, Weekly Exercise (Minutes/Week): 210, Intensity: Mild, Exercise limited by: None identified   Goals Addressed   None    Depression Screen    02/06/2022    4:44 PM 02/06/2022    4:43 PM 02/06/2022    4:20 PM 02/02/2021   12:54 PM 01/05/2020   11:19 AM 01/05/2019    2:13 PM  01/02/2018   10:30 AM  PHQ 2/9 Scores  PHQ - 2 Score 0 0 0 0 0 0 0    Fall Risk    02/06/2022    4:05 PM 02/02/2021   12:53 PM 01/05/2020   11:18 AM 01/05/2019    2:13 PM 01/02/2018   10:30 AM  Fall Risk   Falls in the past year? 0 0 0 0 No  Number falls  in past yr: 0 0 0    Injury with Fall? 0 0 0    Risk for fall due to : No Fall Risks      Follow up Falls evaluation completed Falls prevention discussed Education provided;Falls prevention discussed      FALL RISK PREVENTION PERTAINING TO THE HOME:  Any stairs in or around the home? Yes  If so, are there any without handrails? No  Home free of loose throw rugs in walkways, pet beds, electrical cords, etc? Yes  Adequate lighting in your home to reduce risk of falls? Yes   ASSISTIVE DEVICES UTILIZED TO PREVENT FALLS:  Life alert? No  Use of a cane, walker or w/c? No  Grab bars in the bathroom? Yes  Shower chair or bench in shower? Yes  Elevated toilet seat or a handicapped toilet? No   TIMED UP AND GO:  Was the test performed? Yes .  Length of time to ambulate 10 feet: 10 sec.   Gait steady and fast without use of assistive device  Cognitive Function:    07/17/2017   11:31 AM  MMSE - Mini Mental State Exam  Orientation to time 5  Orientation to Place 5  Registration 3  Attention/ Calculation 5  Recall 3  Language- name 2 objects 2  Language- repeat 1  Language- follow 3 step command 3  Language- read & follow direction 1  Write a sentence 1  Copy design 1  Total score 30        02/06/2022    4:35 PM  6CIT Screen  What Year? 0 points  What month? 0 points  What time? 0 points  Count back from 20 0 points  Months in reverse 0 points  Repeat phrase 0 points  Total Score 0 points    Immunizations Immunization History  Administered Date(s) Administered   Influenza Whole 06/14/2008, 06/09/2010, 07/06/2011   Influenza, High Dose Seasonal PF 06/10/2014, 05/26/2015, 05/24/2016, 07/02/2017, 05/26/2019, 06/08/2021   Influenza, Seasonal, Injecte, Preservative Fre 05/26/2013   Influenza-Unspecified 05/30/2012, 05/22/2013, 05/15/2018, 05/26/2019, 06/07/2020   PFIZER Comirnaty(Gray Top)Covid-19 Tri-Sucrose Vaccine 10/11/2020   PFIZER(Purple Top)SARS-COV-2 Vaccination  11/05/2019, 11/25/2019   Pneumococcal Conjugate-13 01/11/2014   Pneumococcal Polysaccharide-23 07/12/2011, 04/24/2019   Td 03/07/2006   Tdap 02/17/2015   Zoster Recombinat (Shingrix) 12/08/2017, 03/21/2018   Zoster, Live 08/19/2013    TDAP status: Up to date  Flu Vaccine status: Up to date  Pneumococcal vaccine status: Up to date  Covid-19 vaccine status: Declined, Education has been provided regarding the importance of this vaccine but patient still declined. Advised may receive this vaccine at local pharmacy or Health Dept.or vaccine clinic. Aware to provide a copy of the vaccination record if obtained from local pharmacy or Health Dept. Verbalized acceptance and understanding.  Qualifies for Shingles Vaccine? Yes   Zostavax completed No   Shingrix Completed?: Yes  Screening Tests Health Maintenance  Topic Date Due   COVID-19 Vaccine (4 - Booster for Blum series) 12/06/2020   INFLUENZA VACCINE  04/10/2022   MAMMOGRAM  07/07/2022  TETANUS/TDAP  02/16/2025   Pneumonia Vaccine 84+ Years old  Completed   DEXA SCAN  Completed   Hepatitis C Screening  Completed   Zoster Vaccines- Shingrix  Completed   HPV VACCINES  Aged Out   COLONOSCOPY (Pts 45-63yr Insurance coverage will need to be confirmed)  DLos ArcosMaintenance Due  Topic Date Due   COVID-19 Vaccine (4 - Booster for PMincoseries) 12/06/2020    Colorectal cancer screening: No longer required.   Mammogram status: Completed 07/07/21. Repeat every year  Bone Density status: Completed 07/07/21. Results reflect: Bone density results: OSTEOPOROSIS. Repeat every 2 years.  Lung Cancer Screening: (Low Dose CT Chest recommended if Age 77-80years, 30 pack-year currently smoking OR have quit w/in 15years.) does not qualify.   Lung Cancer Screening Referral: N/A  Additional Screening:  Hepatitis C Screening: does qualify; Completed 06/03/15  Vision Screening: Recommended annual  ophthalmology exams for early detection of glaucoma and other disorders of the eye. Is the patient up to date with their annual eye exam?  Yes  Who is the provider or what is the name of the office in which the patient attends annual eye exams? Dr. GEulas PostIf pt is not established with a provider, would they like to be referred to a provider to establish care? No .   Dental Screening: Recommended annual dental exams for proper oral hygiene  Community Resource Referral / Chronic Care Management: CRR required this visit?  No   CCM required this visit?  No      Plan:     I have personally reviewed and noted the following in the patient's chart:   Medical and social history Use of alcohol, tobacco or illicit drugs  Current medications and supplements including opioid prescriptions.  Functional ability and status Nutritional status Physical activity Advanced directives List of other physicians Hospitalizations, surgeries, and ER visits in previous 12 months Vitals Screenings to include cognitive, depression, and falls Referrals and appointments  In addition, I have reviewed and discussed with patient certain preventive protocols, quality metrics, and best practice recommendations. A written personalized care plan for preventive services as well as general preventive health recommendations were provided to patient.     SDuard BradyChism, [place]  02/06/2022   Nurse Notes: none

## 2022-02-08 ENCOUNTER — Ambulatory Visit: Payer: Medicare Other

## 2022-02-09 ENCOUNTER — Telehealth: Payer: Self-pay | Admitting: Family Medicine

## 2022-02-12 NOTE — Telephone Encounter (Signed)
Opened in error

## 2022-03-09 DIAGNOSIS — H2513 Age-related nuclear cataract, bilateral: Secondary | ICD-10-CM | POA: Diagnosis not present

## 2022-03-09 DIAGNOSIS — H401131 Primary open-angle glaucoma, bilateral, mild stage: Secondary | ICD-10-CM | POA: Diagnosis not present

## 2022-03-09 DIAGNOSIS — H04123 Dry eye syndrome of bilateral lacrimal glands: Secondary | ICD-10-CM | POA: Diagnosis not present

## 2022-03-14 ENCOUNTER — Encounter: Payer: Self-pay | Admitting: Internal Medicine

## 2022-03-20 ENCOUNTER — Ambulatory Visit (AMBULATORY_SURGERY_CENTER): Payer: Medicare Other | Admitting: Internal Medicine

## 2022-03-20 ENCOUNTER — Encounter: Payer: Self-pay | Admitting: Internal Medicine

## 2022-03-20 VITALS — BP 102/42 | HR 48 | Temp 98.0°F | Resp 13 | Ht 60.0 in | Wt 149.0 lb

## 2022-03-20 DIAGNOSIS — I10 Essential (primary) hypertension: Secondary | ICD-10-CM | POA: Diagnosis not present

## 2022-03-20 DIAGNOSIS — K648 Other hemorrhoids: Secondary | ICD-10-CM

## 2022-03-20 DIAGNOSIS — K5732 Diverticulitis of large intestine without perforation or abscess without bleeding: Secondary | ICD-10-CM

## 2022-03-20 DIAGNOSIS — M797 Fibromyalgia: Secondary | ICD-10-CM | POA: Diagnosis not present

## 2022-03-20 DIAGNOSIS — D124 Benign neoplasm of descending colon: Secondary | ICD-10-CM

## 2022-03-20 DIAGNOSIS — D123 Benign neoplasm of transverse colon: Secondary | ICD-10-CM

## 2022-03-20 DIAGNOSIS — K5140 Inflammatory polyps of colon without complications: Secondary | ICD-10-CM | POA: Diagnosis not present

## 2022-03-20 MED ORDER — SODIUM CHLORIDE 0.9 % IV SOLN: 500.0000 mL | Freq: Once | INTRAVENOUS | Status: DC

## 2022-03-20 NOTE — Progress Notes (Signed)
VS by CW. ?

## 2022-03-20 NOTE — Progress Notes (Signed)
Called to room to assist during endoscopic procedure.  Patient ID and intended procedure confirmed with present staff. Received instructions for my participation in the procedure from the performing physician.  

## 2022-03-20 NOTE — Op Note (Addendum)
[facility] Patient Name: Kayla [name] Procedure Date: 03/20/2022 2:16 PM MRN: [id number] Endoscopist: Gatha Mayer , MD Age: 76 Referring MD:  Date of Birth: April 10, 1946 Gender: Female Account #: [id number] Procedure:                Colonoscopy Indications:              Diverticulitis, Follow-up of diverticulitis Medicines:                Monitored Anesthesia Care Procedure:                Pre-Anesthesia Assessment:                           - Prior to the procedure, a History and Physical                            was performed, and patient medications and                            allergies were reviewed. The patient's tolerance of                            previous anesthesia was also reviewed. The risks                            and benefits of the procedure and the sedation                            options and risks were discussed with the patient.                            All questions were answered, and informed consent                            was obtained. Prior Anticoagulants: The patient has                            taken no previous anticoagulant or antiplatelet                            agents. ASA Grade Assessment: III - A patient with                            severe systemic disease. After reviewing the risks                            and benefits, the patient was deemed in                            satisfactory condition to undergo the procedure.                           After obtaining informed consent, the colonoscope  was passed under direct vision. Throughout the                            procedure, the patient's blood pressure, pulse, and                            oxygen saturations were monitored continuously. The                            Olympus CF-HQ190L [id number]) Colonoscope was                            introduced through the anus and advanced to the the                            cecum,  identified by appendiceal orifice and                            ileocecal valve. The colonoscopy was somewhat                            difficult due to significant looping. Successful                            completion of the procedure was aided by                            withdrawing the scope and replacing with the adult                            colonoscope. The ileocecal valve, appendiceal                            orifice, and rectum were photographed. The quality                            of the bowel preparation was good. The patient                            tolerated the procedure well. Scope In: 2:30:14 PM Scope Out: 4:33:29 PM Scope Withdrawal Time: 0 hours 8 minutes 5 seconds  Total Procedure Duration: 0 hours 26 minutes 4 seconds  Findings:                 The perianal and digital rectal examinations were                            normal.                           Two sessile polyps were found in the transverse                            colon. The polyps were diminutive in size. These  polyps were removed with a cold snare. Resection                            and retrieval were complete. Verification of                            patient identification for the specimen was done.                            Estimated blood loss was minimal.                           Multiple diverticula were found in the sigmoid                            colon.                           Internal hemorrhoids were found.                           The exam was otherwise without abnormality on                            direct and retroflexion views. Complications:            No immediate complications. Estimated Blood Loss:     Estimated blood loss was minimal. Impression:               - Two diminutive polyps in the transverse colon,                            removed with a cold snare. Resected and retrieved.                           - Severe  diverticulosis in the sigmoid colon.                           - Internal hemorrhoids.                           - The examination was otherwise normal on direct                            and retroflexion views. Recommendation:           - Patient has a contact number available for                            emergencies. The signs and symptoms of potential                            delayed complications were discussed with the                            patient. Return to normal activities tomorrow.  Written discharge instructions were provided to the                            patient.                           - No repeat colonoscopy due to age.                           - Await pathology results.                           Gatha Mayer, MD 03/20/2022 3:08:12 PM This report has been signed electronically.

## 2022-03-20 NOTE — Patient Instructions (Addendum)
There were 2 tiny polyps removed, diverticulosis and hemorrhoids.  I will have polyps analyzed but they certainly look benign.   I appreciate the opportunity to care for you. Gatha Mayer, MD, [facility]     Discharge instructions given. Handouts on polyps,diverticulosis and hemorrhoids. Resume previous medications. YOU HAD AN ENDOSCOPIC PROCEDURE TODAY AT [facility]:   Refer to the procedure report that was given to you for any specific questions about what was found during the examination.  If the procedure report does not answer your questions, please call your gastroenterologist to clarify.  If you requested that your care partner not be given the details of your procedure findings, then the procedure report has been included in a sealed envelope for you to review at your convenience later.  YOU SHOULD EXPECT: Some feelings of bloating in the abdomen. Passage of more gas than usual.  Walking can help get rid of the air that was put into your GI tract during the procedure and reduce the bloating. If you had a lower endoscopy (such as a colonoscopy or flexible sigmoidoscopy) you may notice spotting of blood in your stool or on the toilet paper. If you underwent a bowel prep for your procedure, you may not have a normal bowel movement for a few days.  Please Note:  You might notice some irritation and congestion in your nose or some drainage.  This is from the oxygen used during your procedure.  There is no need for concern and it should clear up in a day or so.  SYMPTOMS TO REPORT IMMEDIATELY:  Following lower endoscopy (colonoscopy or flexible sigmoidoscopy):  Excessive amounts of blood in the stool  Significant tenderness or worsening of abdominal pains  Swelling of the abdomen that is new, acute  Fever of 100F or higher   For urgent or emergent issues, a gastroenterologist can be reached at any hour by calling [phone]. Do not use MyChart messaging for urgent  concerns.    DIET:  We do recommend a small meal at first, but then you may proceed to your regular diet.  Drink plenty of fluids but you should avoid alcoholic beverages for 24 hours.  ACTIVITY:  You should plan to take it easy for the rest of today and you should NOT DRIVE or use heavy machinery until tomorrow (because of the sedation medicines used during the test).    FOLLOW UP: Our staff will call the number listed on your records the next business day following your procedure.  We will call around 7:15- 8:00 am to check on you and address any questions or concerns that you may have regarding the information given to you following your procedure. If we do not reach you, we will leave a message.  If you develop any symptoms (ie: fever, flu-like symptoms, shortness of breath, cough etc.) before then, please call [phone].  If you test positive for Covid 19 in the 2 weeks post procedure, please call and report this information to [place].    If any biopsies were taken you will be contacted by phone or by letter within the next 1-3 weeks.  Please call us at [phone] if you have not heard about the biopsies in 3 weeks.    SIGNATURES/CONFIDENTIALITY: You and/or your care partner have signed paperwork which will be entered into your electronic medical record.  These signatures attest to the fact that that the information above on your After Visit Summary has been reviewed and is  understood.  Full responsibility of the confidentiality of this discharge information lies with you and/or your care-partner.

## 2022-03-20 NOTE — Progress Notes (Signed)
[facility] History and Physical   Primary Care Physician:  [name], DO   Reason for Procedure:   Diverticulitis, change in bowel habit  Plan:    colonoscopy     HPI: Kayla BEYERSDORF is a 76 y.o. female here for evaluation after diverticulitis and bowel habit change   Past Medical History:  Diagnosis Date   Abscess of left groin 11/10/2014   Allergy    Asthma    Cellulitis 10/20/2014   Diverticulitis    Diverticulosis    Fasting hyperglycemia 04/2011   FBS 108   Fibromyalgia    Fundic gland polyps of stomach, benign    GERD (gastroesophageal reflux disease)    gastric polyp x3   Glaucoma     Dr Bing Plume   Hypertension    IBS (irritable bowel syndrome)    Osteopenia    last 07/2011   Pneumonia     OP as child   Shingles     Past Surgical History:  Procedure Laterality Date   COLONOSCOPY W/ POLYPECTOMY  1993,2002,2007   last  colonoscopy 2007, Dr Carlean Purl   DILATION AND CURETTAGE OF UTERUS     ESOPHAGOGASTRODUODENOSCOPY     SPINE SURGERY     T9-L5 fusions    Prior to Admission medications   Medication Sig Start Date End Date Taking? Authorizing Provider  amLODipine (NORVASC) 2.5 MG tablet TAKE 1 TABLET(2.5 MG) BY MOUTH DAILY 08/28/21  Yes Roma Schanz R, DO  bifidobacterium infantis (ALIGN) capsule Take 1 capsule by mouth daily. 12/29/12  Yes Gatha Mayer, MD  Calcium-Magnesium-Vitamin D (CALCIUM MAGNESIUM PO) Take 2 tablets by mouth daily.   Yes [provider]  carboxymethylcellulose (REFRESH PLUS) 0.5 % SOLN 1 drop 3 (three) times daily as needed.   Yes [provider]  cholecalciferol (VITAMIN D) 1000 units tablet Take 2,000 Units by mouth daily. 12/24/16  Yes [provider]  famotidine (PEPCID) 20 MG tablet Take 20 mg by mouth daily.   Yes [provider]  latanoprost (XALATAN) 0.005 % ophthalmic solution Place 1 drop into both eyes at bedtime.   Yes [provider]  metoprolol  tartrate (LOPRESSOR) 25 MG tablet SCHEDULE OFFICE VISIT FOR FUTURE REFILLS 01/08/22  Yes Minus Breeding, MD  montelukast (SINGULAIR) 10 MG tablet TAKE 1 TABLET(10 MG) BY MOUTH AT BEDTIME 10/18/21  Yes Roma Schanz R, DO  nystatin cream (MYCOSTATIN) Apply 1 application. topically 2 (two) times daily. 12/11/21  Yes Roma Schanz R, DO  Polyethyl Glycol-Propyl Glycol (SYSTANE) 0.4-0.3 % GEL ophthalmic gel Place 1 application into both eyes.   Yes [provider]  Vitamin Mixture (ESTER-C PO) Take 1,000 mg by mouth daily.   Yes [provider]  acetaminophen (TYLENOL) 500 MG tablet Take 500 mg by mouth as needed.    [provider]  albuterol (PROVENTIL) (2.5 MG/3ML) 0.083% nebulizer solution Take 3 mLs (2.5 mg total) by nebulization every 6 (six) hours as needed for wheezing or shortness of breath. 03/08/20   Carollee Herter, Alferd Apa, DO  albuterol (VENTOLIN HFA) 108 (90 Base) MCG/ACT inhaler Inhale 2 puffs into the lungs every 6 (six) hours as needed for wheezing or shortness of breath. As needed only 04/13/21   Carollee Herter, Alferd Apa, DO  EPINEPHrine (EPIPEN 2-PAK) 0.3 mg/0.3 mL IJ SOAJ injection As directed 01/03/22   Carollee Herter, Alferd Apa, DO  Esomeprazole Magnesium (NEXIUM 24HR PO) Take 1 capsule by mouth as needed.  [provider]  Flaxseed, Linseed, (GROUND FLAX SEEDS PO)  05/26/19   [provider]  fluticasone (FLONASE) 50 MCG/ACT nasal spray USE 2 SPRAYS IN EACH  NOSTRIL ONCE A DAY 03/27/19   Carollee Herter, Alferd Apa, DO  hyoscyamine (LEVSIN SL) 0.125 MG SL tablet Place 1 tablet (0.125 mg total) under the tongue every 6 (six) hours as needed. 01/10/22   Saguier, Percell Miller, PA-C  sodium chloride (OCEAN) 0.65 % SOLN nasal spray Place 1 spray into both nostrils as needed for congestion.    [provider]  triamcinolone (KENALOG) 0.1 % Apply 1 application topically 2 (two) times daily. 08/01/20   [name], DO    Current Outpatient  Medications  Medication Sig Dispense Refill   amLODipine (NORVASC) 2.5 MG tablet TAKE 1 TABLET(2.5 MG) BY MOUTH DAILY 90 tablet 1   bifidobacterium infantis (ALIGN) capsule Take 1 capsule by mouth daily. 21 capsule 0   Calcium-Magnesium-Vitamin D (CALCIUM MAGNESIUM PO) Take 2 tablets by mouth daily.     carboxymethylcellulose (REFRESH PLUS) 0.5 % SOLN 1 drop 3 (three) times daily as needed.     cholecalciferol (VITAMIN D) 1000 units tablet Take 2,000 Units by mouth daily.     famotidine (PEPCID) 20 MG tablet Take 20 mg by mouth daily.     latanoprost (XALATAN) 0.005 % ophthalmic solution Place 1 drop into both eyes at bedtime.     metoprolol tartrate (LOPRESSOR) 25 MG tablet SCHEDULE OFFICE VISIT FOR FUTURE REFILLS 90 tablet 3   montelukast (SINGULAIR) 10 MG tablet TAKE 1 TABLET(10 MG) BY MOUTH AT BEDTIME 90 tablet 3   nystatin cream (MYCOSTATIN) Apply 1 application. topically 2 (two) times daily. 30 g 1   Polyethyl Glycol-Propyl Glycol (SYSTANE) 0.4-0.3 % GEL ophthalmic gel Place 1 application into both eyes.     Vitamin Mixture (ESTER-C PO) Take 1,000 mg by mouth daily.     acetaminophen (TYLENOL) 500 MG tablet Take 500 mg by mouth as needed.     albuterol (PROVENTIL) (2.5 MG/3ML) 0.083% nebulizer solution Take 3 mLs (2.5 mg total) by nebulization every 6 (six) hours as needed for wheezing or shortness of breath. 75 mL 12   albuterol (VENTOLIN HFA) 108 (90 Base) MCG/ACT inhaler Inhale 2 puffs into the lungs every 6 (six) hours as needed for wheezing or shortness of breath. As needed only 18 g 3   EPINEPHrine (EPIPEN 2-PAK) 0.3 mg/0.3 mL IJ SOAJ injection As directed 1 each 0   Esomeprazole Magnesium (NEXIUM 24HR PO) Take 1 capsule by mouth as needed.     Flaxseed, Linseed, (GROUND FLAX SEEDS PO)      fluticasone (FLONASE) 50 MCG/ACT nasal spray USE 2 SPRAYS IN EACH  NOSTRIL ONCE A DAY 48 g 3   hyoscyamine (LEVSIN SL) 0.125 MG SL tablet Place 1 tablet (0.125 mg total) under the tongue every 6  (six) hours as needed. 10 tablet 1   sodium chloride (OCEAN) 0.65 % SOLN nasal spray Place 1 spray into both nostrils as needed for congestion.     triamcinolone (KENALOG) 0.1 % Apply 1 application topically 2 (two) times daily. 90 g 3   Current Facility-Administered Medications  Medication Dose Route Frequency Provider Last Rate Last Admin   0.9 %  sodium chloride infusion  500 mL Intravenous Once Gatha Mayer, MD        Allergies as of 03/20/2022 - Review Complete 03/20/2022  Allergen Reaction Noted   Cephalosporins  05/24/2011   Gabapentin  Levofloxacin     Psyllium  11/08/2008   Sulfonamide derivatives Anaphylaxis    Aspirin     Belladonna  04/02/2007   Ciprofloxacin hcl  05/24/2011   Conjugated estrogens     Cymbopogon  03/20/2022   Doxycycline  02/27/2016   Flovent hfa [fluticasone]  09/11/2018   Nabumetone     Nsaids  05/24/2011   Soybean-containing drug products Other (See Comments) 11/10/2014   Zicam cold remedy [homeopathic products]  12/01/2012   Zocor [simvastatin] Itching 10/09/2016   Nickel Itching and Rash 12/22/2020   Paba derivatives Itching and Rash 03/20/2022   Prednisone Rash     Family History  Problem Relation Age of Onset   Breast cancer Mother    Heart failure Father        CHF, CABG late 58s   Breast cancer Sister    Breast cancer Maternal Aunt        two   Heart disease Maternal Aunt    Breast cancer Paternal Aunt    Asthma Paternal Aunt    Coronary artery disease Paternal Aunt        triple CABG   Stomach cancer Paternal Uncle    Heart attack Paternal Uncle         MI in 48s   Kidney disease Maternal Grandmother    Heart attack Paternal Grandmother        MI in late 54s   Heart disease Paternal Grandfather    Pulmonary embolism Son    Deep vein thrombosis Son    Heart Problems Son        Heart attacks related to a "blood disorder"    Ovarian cancer Other        Niece x2   Breast cancer Other        niece   Diabetes Neg Hx     Stroke Neg Hx    COPD Neg Hx    Colon polyps Neg Hx    Colon cancer Neg Hx    Esophageal cancer Neg Hx    Rectal cancer Neg Hx     Social History   Socioeconomic History   Marital status: Widowed    Spouse name: Not on file   Number of children: Not on file   Years of education: Not on file   Highest education level: Not on file  Occupational History   Occupation: retired  Tobacco Use   Smoking status: Former    Packs/day: 0.50    Years: 10.00    Total pack years: 5.00    Types: Cigarettes    Quit date: 09/10/1974    Years since quitting: 47.5   Smokeless tobacco: Never   Tobacco comments:    smoked 1966- 1976, up to 1 ppd  Vaping Use   Vaping Use: Never used  Substance and Sexual Activity   Alcohol use: No    Alcohol/week: 0.0 standard drinks of alcohol   Drug use: No   Sexual activity: Yes  Other Topics Concern   Not on file  Social History Narrative   Daily caffeine 3 cups   Regular exercise   Married         Social Determinants of Health   Financial Resource Strain: Low Risk  (02/06/2022)   Overall Financial Resource Strain (CARDIA)    Difficulty of Paying Living Expenses: Not hard at all  Food Insecurity: No Food Insecurity (02/06/2022)   Hunger Vital Sign    Worried About Running Out of Food in the  Last Year: Never true    [place] in the Last Year: Never true  Transportation Needs: No Transportation Needs (02/06/2022)   PRAPARE - Hydrologist (Medical): No    Lack of Transportation (Non-Medical): No  Physical Activity: Sufficiently Active (02/06/2022)   Exercise Vital Sign    Days of Exercise per Week: 7 days    Minutes of Exercise per Session: 30 min  Stress: No Stress Concern Present (02/06/2022)   [place]    Feeling of Stress : Only a little  Social Connections: Moderately Isolated (02/06/2022)   Social Connection and Isolation Panel [NHANES]     Frequency of Communication with Friends and Family: More than three times a week    Frequency of Social Gatherings with Friends and Family: More than three times a week    Attends Religious Services: More than 4 times per year    Active Member of [employer] or Organizations: No    Attends Archivist Meetings: Never    Marital Status: Widowed  Intimate Partner Violence: Not At Risk (02/06/2022)   Humiliation, Afraid, Rape, and Kick questionnaire    Fear of Current or Ex-Partner: No    Emotionally Abused: No    Physically Abused: No    Sexually Abused: No    Review of Systems:  All other review of systems negative except as mentioned in the HPI.  Physical Exam: Vital signs BP (!) 143/61   Pulse (!) 54   Temp 98 F (36.7 C)   Resp 15   Ht 5' (1.524 m)   Wt 149 lb (67.6 kg)   SpO2 98%   BMI 29.10 kg/m   General:   Alert,  Well-developed, well-nourished, pleasant and cooperative in NAD Lungs:  Clear throughout to auscultation.   Heart:  Regular rate and rhythm; no murmurs, clicks, rubs,  or gallops. Abdomen:  Soft, nontender and nondistended. Normal bowel sounds.   Neuro/Psych:  Alert and cooperative. Normal mood and affect. A and O x 3   '@Madelein Mahadeo'$  Simonne Maffucci, MD, [facility] [phone] (pager) 03/20/2022 2:21 PM@

## 2022-03-20 NOTE — Progress Notes (Signed)
PT taken to PACU. Monitors in place. VSS. Report given to RN. 

## 2022-03-21 ENCOUNTER — Telehealth: Payer: Self-pay | Admitting: *Deleted

## 2022-03-21 NOTE — Telephone Encounter (Signed)
  Follow up Call-     03/20/2022    1:25 PM  Call back number  Post procedure Call Back phone  # [phone]  Permission to leave phone message Yes     Patient questions:  Do you have a fever, pain , or abdominal swelling? No. Pain Score  0 *  Have you tolerated food without any problems? Yes.    Have you been able to return to your normal activities? Yes.    Do you have any questions about your discharge instructions: Diet   No. Medications  No. Follow up visit  No.  Do you have questions or concerns about your Care? No.  Actions: * If pain score is 4 or above: No action needed, pain <4.

## 2022-03-28 ENCOUNTER — Encounter: Payer: Self-pay | Admitting: Internal Medicine

## 2022-06-09 ENCOUNTER — Emergency Department (HOSPITAL_BASED_OUTPATIENT_CLINIC_OR_DEPARTMENT_OTHER): Payer: Medicare Other

## 2022-06-09 ENCOUNTER — Emergency Department (HOSPITAL_BASED_OUTPATIENT_CLINIC_OR_DEPARTMENT_OTHER)
Admission: EM | Admit: 2022-06-09 | Discharge: 2022-06-09 | Disposition: A | Discharge status: Home / Self Care | Payer: Medicare Other | Attending: Emergency Medicine | Admitting: Emergency Medicine

## 2022-06-09 ENCOUNTER — Encounter (HOSPITAL_BASED_OUTPATIENT_CLINIC_OR_DEPARTMENT_OTHER): Payer: Self-pay | Admitting: Emergency Medicine

## 2022-06-09 ENCOUNTER — Other Ambulatory Visit: Payer: Self-pay

## 2022-06-09 DIAGNOSIS — G459 Transient cerebral ischemic attack, unspecified: Secondary | ICD-10-CM | POA: Diagnosis not present

## 2022-06-09 DIAGNOSIS — K5730 Diverticulosis of large intestine without perforation or abscess without bleeding: Secondary | ICD-10-CM | POA: Diagnosis not present

## 2022-06-09 DIAGNOSIS — G252 Other specified forms of tremor: Secondary | ICD-10-CM | POA: Insufficient documentation

## 2022-06-09 DIAGNOSIS — Z20822 Contact with and (suspected) exposure to covid-19: Secondary | ICD-10-CM | POA: Diagnosis not present

## 2022-06-09 DIAGNOSIS — R202 Paresthesia of skin: Secondary | ICD-10-CM | POA: Diagnosis not present

## 2022-06-09 DIAGNOSIS — R200 Anesthesia of skin: Secondary | ICD-10-CM | POA: Diagnosis not present

## 2022-06-09 DIAGNOSIS — I10 Essential (primary) hypertension: Secondary | ICD-10-CM | POA: Insufficient documentation

## 2022-06-09 DIAGNOSIS — Z79899 Other long term (current) drug therapy: Secondary | ICD-10-CM | POA: Diagnosis not present

## 2022-06-09 DIAGNOSIS — R251 Tremor, unspecified: Secondary | ICD-10-CM | POA: Diagnosis not present

## 2022-06-09 DIAGNOSIS — R531 Weakness: Secondary | ICD-10-CM | POA: Diagnosis not present

## 2022-06-09 DIAGNOSIS — M47812 Spondylosis without myelopathy or radiculopathy, cervical region: Secondary | ICD-10-CM | POA: Diagnosis not present

## 2022-06-09 DIAGNOSIS — K760 Fatty (change of) liver, not elsewhere classified: Secondary | ICD-10-CM | POA: Diagnosis not present

## 2022-06-09 LAB — COMPREHENSIVE METABOLIC PANEL
ALT: 19 U/L (ref 0–44)
AST: 30 U/L (ref 15–41)
Albumin: 4.7 g/dL (ref 3.5–5.0)
Alkaline Phosphatase: 102 U/L (ref 38–126)
Anion gap: 9 (ref 5–15)
BUN: 14 mg/dL (ref 8–23)
CO2: 26 mmol/L (ref 22–32)
Calcium: 9.4 mg/dL (ref 8.9–10.3)
Chloride: 101 mmol/L (ref 98–111)
Creatinine, Ser: 0.65 mg/dL (ref 0.44–1.00)
GFR, Estimated: >60 mL/min (ref >60)
Glucose, Bld: 110 mg/dL — ABNORMAL HIGH (ref 70–99)
Potassium: 4 mmol/L (ref 3.5–5.1)
Sodium: 136 mmol/L (ref 135–145)
Total Bilirubin: 0.5 mg/dL (ref 0.3–1.2)
Total Protein: 8.3 g/dL — ABNORMAL HIGH (ref 6.5–8.1)

## 2022-06-09 LAB — DIFFERENTIAL
Abs Immature Granulocytes: 0.01 10*3/uL (ref 0.00–0.07)
Basophils Absolute: 0 10*3/uL (ref 0.0–0.1)
Basophils Relative: 0 %
Eosinophils Absolute: 0.1 10*3/uL (ref 0.0–0.5)
Eosinophils Relative: 1 %
Immature Granulocytes: 0 %
Lymphocytes Relative: 15 %
Lymphs Abs: 0.8 10*3/uL (ref 0.7–4.0)
Monocytes Absolute: 0.4 10*3/uL (ref 0.1–1.0)
Monocytes Relative: 8 %
Neutro Abs: 3.9 10*3/uL (ref 1.7–7.7)
Neutrophils Relative %: 76 %

## 2022-06-09 LAB — URINALYSIS, ROUTINE W REFLEX MICROSCOPIC
Bilirubin Urine: NEGATIVE
Glucose, UA: NEGATIVE mg/dL
Hgb urine dipstick: NEGATIVE
Ketones, ur: NEGATIVE mg/dL
Leukocytes,Ua: NEGATIVE
Nitrite: NEGATIVE
Protein, ur: NEGATIVE mg/dL
Specific Gravity, Urine: 1.01 (ref 1.005–1.030)
pH: 6 (ref 5.0–8.0)

## 2022-06-09 LAB — CBC
HCT: 44.3 % (ref 36.0–46.0)
Hemoglobin: 14.7 g/dL (ref 12.0–15.0)
MCH: 30.6 pg (ref 26.0–34.0)
MCHC: 33.2 g/dL (ref 30.0–36.0)
MCV: 92.3 fL (ref 80.0–100.0)
Platelets: 172 10*3/uL (ref 150–400)
RBC: 4.8 MIL/uL (ref 3.87–5.11)
RDW: 13.2 % (ref 11.5–15.5)
WBC: 5.2 10*3/uL (ref 4.0–10.5)
nRBC: 0 % (ref 0.0–0.2)

## 2022-06-09 LAB — ETHANOL: Alcohol, Ethyl (B): <10 mg/dL (ref <10)

## 2022-06-09 LAB — RAPID URINE DRUG SCREEN, HOSP PERFORMED
Amphetamines: NOT DETECTED
Barbiturates: NOT DETECTED
Benzodiazepines: NOT DETECTED
Cocaine: NOT DETECTED
Opiates: NOT DETECTED
Tetrahydrocannabinol: NOT DETECTED

## 2022-06-09 LAB — RESP PANEL BY RT-PCR (FLU A&B, COVID) ARPGX2
Influenza A by PCR: NEGATIVE
Influenza B by PCR: NEGATIVE
SARS Coronavirus 2 by RT PCR: NEGATIVE

## 2022-06-09 LAB — APTT: aPTT: 37 seconds — ABNORMAL HIGH (ref 24–36)

## 2022-06-09 LAB — CBG MONITORING, ED: Glucose-Capillary: 94 mg/dL (ref 70–99)

## 2022-06-09 LAB — PROTIME-INR
INR: 1.2 (ref 0.8–1.2)
Prothrombin Time: 14.6 seconds (ref 11.4–15.2)

## 2022-06-09 MED ORDER — IOHEXOL 350 MG/ML SOLN
150.0000 mL | Freq: Once | INTRAVENOUS | Status: AC | PRN
  Administered 2022-06-09: 100 mL via INTRAVENOUS

## 2022-06-09 MED ORDER — SODIUM CHLORIDE 0.9 % IV BOLUS
1000.0000 mL | Freq: Once | INTRAVENOUS | Status: AC
  Administered 2022-06-09: 1000 mL via INTRAVENOUS

## 2022-06-09 NOTE — ED Triage Notes (Signed)
Pt reports BLE felt weak and feels "numb like asleep" about 1630 today; was able to ambulate, but sts that sensation moved up her arms and they felt like they were trembling

## 2022-06-09 NOTE — Discharge Instructions (Signed)
As we discussed, your CT scans and labs were unremarkable today.  If you have persistent tremors and leg weakness or numbness, I recommend that you follow-up with a neurologist for further testing  Return to ER if you have trouble speaking or unable to walk or weakness

## 2022-06-09 NOTE — ED Provider Notes (Signed)
[facility] Provider Note   CSN: [id number] Arrival date & time: 06/09/22  1818     History  Chief Complaint  Patient presents with   Extremity Weakness    Kayla [name] is a 76 y.o. female history of hypertension, diverticulitis, here presenting with bilateral leg weakness and arm tremors.  Patient states that around 4:30 PM she noticed that both of her legs felt weak.  She states that she feels that her shoes are too tight.  She also has compression stockings on and she removed them.  She states that she was able to walk to the mailbox but her legs just feel very wobbly.  She denies any back pain.  She denies any fall or trauma.  Denies any chest pain.  She also then had bilateral arm tremors as well.  Denies any trouble speaking or focal weakness. She called her doctor and was sent here for further evaluation.  Patient denies any history of AAA.  Denies any history of stroke in the past.   The history is provided by the patient.       Home Medications Prior to Admission medications   Medication Sig Start Date End Date Taking? Authorizing Provider  acetaminophen (TYLENOL) 500 MG tablet Take 500 mg by mouth as needed.    [provider]  albuterol (PROVENTIL) (2.5 MG/3ML) 0.083% nebulizer solution Take 3 mLs (2.5 mg total) by nebulization every 6 (six) hours as needed for wheezing or shortness of breath. 03/08/20   Carollee Herter, Alferd Apa, DO  albuterol (VENTOLIN HFA) 108 (90 Base) MCG/ACT inhaler Inhale 2 puffs into the lungs every 6 (six) hours as needed for wheezing or shortness of breath. As needed only 04/13/21   Carollee Herter, Kendrick Fries R, DO  amLODipine (NORVASC) 2.5 MG tablet TAKE 1 TABLET(2.5 MG) BY MOUTH DAILY 08/28/21   Carollee Herter, Alferd Apa, DO  bifidobacterium infantis (ALIGN) capsule Take 1 capsule by mouth daily. 12/29/12   Gatha Mayer, MD  Calcium-Magnesium-Vitamin D (CALCIUM MAGNESIUM PO) Take 2 tablets by mouth daily.    [provider]  carboxymethylcellulose (REFRESH PLUS) 0.5 % SOLN 1 drop 3 (three) times daily as needed.    [provider]  cholecalciferol (VITAMIN D) 1000 units tablet Take 2,000 Units by mouth daily. 12/24/16   [provider]  EPINEPHrine (EPIPEN 2-PAK) 0.3 mg/0.3 mL IJ SOAJ injection As directed 01/03/22   Carollee Herter, Alferd Apa, DO  Esomeprazole Magnesium (NEXIUM 24HR PO) Take 1 capsule by mouth as needed.    [provider]  famotidine (PEPCID) 20 MG tablet Take 20 mg by mouth daily.    [provider]  Flaxseed, Linseed, (GROUND FLAX SEEDS PO)  05/26/19   [provider]  fluticasone (FLONASE) 50 MCG/ACT nasal spray USE 2 SPRAYS IN EACH  NOSTRIL ONCE A DAY 03/27/19   Carollee Herter, Alferd Apa, DO  hyoscyamine (LEVSIN SL) 0.125 MG SL tablet Place 1 tablet (0.125 mg total) under the tongue every 6 (six) hours as needed. 01/10/22   Saguier, Percell Miller, PA-C  latanoprost (XALATAN) 0.005 % ophthalmic solution Place 1 drop into both eyes at bedtime.    [provider]  metoprolol tartrate (LOPRESSOR) 25 MG tablet SCHEDULE OFFICE VISIT FOR FUTURE REFILLS 01/08/22   Minus Breeding, MD  montelukast (SINGULAIR) 10 MG tablet TAKE 1 TABLET(10 MG) BY MOUTH AT BEDTIME 10/18/21   Carollee Herter, Alferd Apa, DO  nystatin cream (MYCOSTATIN) Apply 1 application. topically 2 (two) times  daily. 12/11/21   Carollee Herter, Alferd Apa, DO  Polyethyl Glycol-Propyl Glycol (SYSTANE) 0.4-0.3 % GEL ophthalmic gel Place 1 application into both eyes.    [provider]  sodium chloride (OCEAN) 0.65 % SOLN nasal spray Place 1 spray into both nostrils as needed for congestion.    [provider]  triamcinolone (KENALOG) 0.1 % Apply 1 application topically 2 (two) times daily. 08/01/20   [name], DO  Vitamin Mixture (ESTER-C PO) Take 1,000 mg by mouth daily.    [provider]      Allergies    Cephalosporins, Gabapentin, Levofloxacin, Psyllium,  Sulfonamide derivatives, Aspirin, Belladonna, Ciprofloxacin hcl, Conjugated estrogens, Cymbopogon, Doxycycline, Flovent hfa [fluticasone], Nabumetone, Nsaids, Soybean-containing drug products, Zicam cold remedy [homeopathic products], Zocor [simvastatin], Nickel, Paba derivatives, and Prednisone    Review of Systems   Review of Systems  Neurological:  Positive for weakness.  All other systems reviewed and are negative.   Physical Exam Updated Vital Signs BP (!) 160/64 (BP Location: Left Arm)   Pulse 65   Temp 98.1 F (36.7 C) (Oral)   Resp 16   Ht 5' 0.75" (1.543 m)   Wt 64 kg   SpO2 100%   BMI 26.86 kg/m  Physical Exam Vitals and nursing note reviewed.  Constitutional:      Comments: Slightly anxious  HENT:     Head: Normocephalic.     Nose: Nose normal.     Mouth/Throat:     Mouth: Mucous membranes are moist.  Eyes:     Pupils: Pupils are equal, round, and reactive to light.  Cardiovascular:     Rate and Rhythm: Normal rate and regular rhythm.     Pulses: Normal pulses.     Heart sounds: Normal heart sounds.  Pulmonary:     Effort: Pulmonary effort is normal.     Breath sounds: Normal breath sounds.  Abdominal:     General: Abdomen is flat.     Palpations: Abdomen is soft.  Musculoskeletal:        General: Normal range of motion.     Cervical back: Normal range of motion and neck supple.     Comments: 2+ pedal pulses bilaterally.  Both feet are slightly cold but has good pulses.  No midline spinal tenderness  Skin:    General: Skin is warm.     Capillary Refill: Capillary refill takes less than 2 seconds.  Neurological:     General: No focal deficit present.     Comments: Patient has bilateral arm tremors.  No obvious facial droop.  No slurred speech.  Patient has normal strength and sensation bilateral arms and legs  Psychiatric:        Mood and Affect: Mood normal.        Behavior: Behavior normal.     ED Results / Procedures / Treatments   Labs (all  labs ordered are listed, but only abnormal results are displayed) Labs Reviewed  COMPREHENSIVE METABOLIC PANEL - Abnormal; Notable for the following components:      Result Value   Glucose, Bld 110 (*)    Total Protein 8.3 (*)    All other components within normal limits  RESP PANEL BY RT-PCR (FLU A&B, COVID) ARPGX2  ETHANOL  CBC  DIFFERENTIAL  RAPID URINE DRUG SCREEN, HOSP PERFORMED  URINALYSIS, ROUTINE W REFLEX MICROSCOPIC  PROTIME-INR  APTT  CBG MONITORING, ED    EKG EKG Interpretation  Date/Time:  Saturday June 09 2022 19:37:29 EDT  Ventricular Rate:  58 PR Interval:  182 QRS Duration: 82 QT Interval:  460 QTC Calculation: 452 R Axis:   17 Text Interpretation: Sinus rhythm Atrial premature complex Low voltage, precordial leads No significant change since last tracing Confirmed by Wandra Arthurs [phone]) on 06/09/2022 7:42:30 PM  Radiology No results found.  Procedures Procedures    Medications Ordered in ED Medications - No data to display  ED Course/ Medical Decision Making/ A&P                           Medical Decision Making DOSHIE MAGGI is a 76 y.o. female here presenting with leg weakness and tremors.  Differential is large.  Consider claudication versus TIA versus anxiety versus electrolyte abnormalities.  To get CBC and CMP and CTA head and neck and CT aorta bifemoral.  Patient does have good pulses but the legs are slightly cold bilaterally   9:47 PM I reviewed patient's labs and independently interpreted CT scans.  Labs are unremarkable.  CTA head and neck unremarkable.  CT angio of the aorta bifemoral were unremarkable.  I wonder if she is starting to develop Parkinson's.  At this point, I think she is stable for discharge can follow-up with neurology outpatient.  I do not think she needs an MRI currently.  Problems Addressed: Leg numbness: acute illness or injury Tremors of nervous system: acute illness or injury  Amount and/or Complexity of  Data Reviewed Labs: ordered. Radiology: ordered and independent interpretation performed. Decision-making details documented in ED Course. ECG/medicine tests: ordered and independent interpretation performed. Decision-making details documented in ED Course.  Risk Prescription drug management.    Final Clinical Impression(s) / ED Diagnoses Final diagnoses:  None    Rx / DC Orders ED Discharge Orders     None         Drenda Freeze, MD 06/09/22 2150

## 2022-06-09 NOTE — ED Notes (Signed)
Patient transported to CT 

## 2022-06-11 ENCOUNTER — Telehealth: Payer: Self-pay

## 2022-06-11 NOTE — Telephone Encounter (Signed)
Pt seen at ED 

## 2022-06-11 NOTE — Telephone Encounter (Signed)
Nurse Assessment Nurse: Hendricks Limes, RN, Kennyth Arnold Date/Time Eilene Ghazi Time): 06/09/2022 5:57:51 PM Confirm and document reason for call. If symptomatic, describe symptoms. ---Caller states x 1 hour ago she felt weakness in legs. Caller reports no other symptoms. Does the patient have any new or worsening symptoms? ---Yes Will a triage be completed? ---Yes Related visit to physician within the last 2 weeks? ---N/A Does the PT have any chronic conditions? (i.e. diabetes, asthma, this includes High risk factors for pregnancy, etc.) ---Yes List chronic conditions. ---HTN, asthma Is this a behavioral health or substance abuse call? ---No Guidelines Guideline Title Affirmed Question Affirmed Notes Nurse Date/Time ([place] Time) Disp. Time Eilene Ghazi Time) Disposition Final User 06/09/2022 5:38:54 PM Attempt made - line busy Hendricks Limes, RN, Kennyth Arnold 06/09/2022 6:02:46 PM Clinical Call Yes Hendricks Limes, RN, Kennyth Arnold Final Disposition 06/09/2022 6:02:46 PM Clinical Call Yes Hendricks Limes, RN, Kennyth Arnold PLEASE NOTE: All timestamps contained within this report are represented as [place] Standard Time. CONFIDENTIALTY NOTICE: This fax transmission is intended only for the addressee. It contains information that is legally privileged, confidential or otherwise protected from use or disclosure. If you are not the intended recipient, you are strictly prohibited from reviewing, disclosing, copying using or disseminating any of this information or taking any action in reliance on or regarding this information. If you have received this fax in error, please notify us immediately by telephone so that we can arrange for its return to [place]. Phone: [phone], Toll-Free: [phone], Fax: [phone] Page: 2 of 2 Call Id: [id number] Comments User: Cristopher Peru, RN Date/Time Eilene Ghazi Time): 06/09/2022 6:02:35 PM Unable to complete triage due to caller stating she is going to be  checked out and her son just arrived

## 2022-06-12 ENCOUNTER — Ambulatory Visit (HOSPITAL_BASED_OUTPATIENT_CLINIC_OR_DEPARTMENT_OTHER)
Admission: RE | Admit: 2022-06-12 | Discharge: 2022-06-12 | Disposition: A | Discharge status: Home / Self Care | Payer: Medicare Other | Source: Ambulatory Visit | Attending: Family Medicine | Admitting: Family Medicine

## 2022-06-12 ENCOUNTER — Encounter: Payer: Self-pay | Admitting: Family Medicine

## 2022-06-12 ENCOUNTER — Ambulatory Visit (INDEPENDENT_AMBULATORY_CARE_PROVIDER_SITE_OTHER): Payer: Medicare Other | Admitting: Family Medicine

## 2022-06-12 VITALS — BP 130/80 | HR 56 | Temp 98.8°F | Resp 18 | Ht 60.6 in | Wt 145.8 lb

## 2022-06-12 DIAGNOSIS — M546 Pain in thoracic spine: Secondary | ICD-10-CM | POA: Diagnosis not present

## 2022-06-12 DIAGNOSIS — M5440 Lumbago with sciatica, unspecified side: Secondary | ICD-10-CM

## 2022-06-12 DIAGNOSIS — M4316 Spondylolisthesis, lumbar region: Secondary | ICD-10-CM | POA: Diagnosis not present

## 2022-06-12 DIAGNOSIS — E559 Vitamin D deficiency, unspecified: Secondary | ICD-10-CM

## 2022-06-12 DIAGNOSIS — M545 Low back pain, unspecified: Secondary | ICD-10-CM | POA: Diagnosis not present

## 2022-06-12 DIAGNOSIS — Z23 Encounter for immunization: Secondary | ICD-10-CM | POA: Diagnosis not present

## 2022-06-12 DIAGNOSIS — R531 Weakness: Secondary | ICD-10-CM

## 2022-06-12 DIAGNOSIS — D229 Melanocytic nevi, unspecified: Secondary | ICD-10-CM

## 2022-06-12 DIAGNOSIS — M533 Sacrococcygeal disorders, not elsewhere classified: Secondary | ICD-10-CM | POA: Diagnosis not present

## 2022-06-12 NOTE — Assessment & Plan Note (Signed)
Er visit reviewed  Er referred her to neuro

## 2022-06-12 NOTE — Progress Notes (Signed)
Subjective:   By signing my name below, I, [name], attest that this documentation has been prepared under the direction and in the presence of [name], DO  06/12/2022    Patient ID: Kayla [name], female    DOB: 02-27-1946, 76 y.o.   MRN: [id number]  Chief Complaint  Patient presents with   ED Visit Follow up    Leg weakness.    HPI Patient is in today for an office visit.  The patient is complaining of weakness in the legs and is somewhat unstable when she walks. She reports trembling in both arms and says its the same feeling she has when she take albuterol. The patient is   She denies chest pain, palpitation, and lower back pain. She is requesting to have a provider look at her back.  Patient reports of skin discoloration on her right shin skin. She is reporting a new mole on back  She reports to have taken new multivitamin and within 2 days she experienced palpitations and stopped taking vitamins. She reports that she eats fairly well.  She monitors her potassium levels closely. Patient is inquiring about seeing a neurologist She is interested in taking influenza vaccine during this visit    Past Medical History:  Diagnosis Date   Abscess of left groin 11/10/2014   Allergy    Asthma    Cellulitis 10/20/2014   Diverticulitis    Diverticulosis    Fasting hyperglycemia 04/2011   FBS 108   Fibromyalgia    Fundic gland polyps of stomach, benign    GERD (gastroesophageal reflux disease)    gastric polyp x3   Glaucoma     Dr Bing Plume   Hypertension    IBS (irritable bowel syndrome)    Osteopenia    last 07/2011   Pneumonia     OP as child   Shingles     Past Surgical History:  Procedure Laterality Date   COLONOSCOPY W/ POLYPECTOMY  1993,2002,2007   last  colonoscopy 2007, Dr Carlean Purl   DILATION AND CURETTAGE OF UTERUS     ESOPHAGOGASTRODUODENOSCOPY     SPINE SURGERY     T9-L5 fusions    Family History  Problem Relation Age of Onset    Breast cancer Mother    Heart failure Father        CHF, CABG late 40s   Breast cancer Sister    Breast cancer Maternal Aunt        two   Heart disease Maternal Aunt    Breast cancer Paternal Aunt    Asthma Paternal Aunt    Coronary artery disease Paternal Aunt        triple CABG   Stomach cancer Paternal Uncle    Heart attack Paternal Uncle         MI in 60s   Kidney disease Maternal Grandmother    Heart attack Paternal Grandmother        MI in late 47s   Heart disease Paternal Grandfather    Pulmonary embolism Son    Deep vein thrombosis Son    Heart Problems Son        Heart attacks related to a "blood disorder"    Ovarian cancer Other        Niece x2   Breast cancer Other        niece   Diabetes Neg Hx    Stroke Neg Hx    COPD Neg Hx    Colon  polyps Neg Hx    Colon cancer Neg Hx    Esophageal cancer Neg Hx    Rectal cancer Neg Hx     Social History   Socioeconomic History   Marital status: Widowed    Spouse name: Not on file   Number of children: Not on file   Years of education: Not on file   Highest education level: Not on file  Occupational History   Occupation: retired  Tobacco Use   Smoking status: Former    Packs/day: 0.50    Years: 10.00    Total pack years: 5.00    Types: Cigarettes    Quit date: 09/10/1974    Years since quitting: 47.7   Smokeless tobacco: Never   Tobacco comments:    smoked 1966- 1976, up to 1 ppd  Vaping Use   Vaping Use: Never used  Substance and Sexual Activity   Alcohol use: No    Alcohol/week: 0.0 standard drinks of alcohol   Drug use: No   Sexual activity: Yes  Other Topics Concern   Not on file  Social History Narrative   Daily caffeine 3 cups   Regular exercise   Married         Social Determinants of Health   Financial Resource Strain: Low Risk  (02/06/2022)   Overall Financial Resource Strain (CARDIA)    Difficulty of Paying Living Expenses: Not hard at all  Food Insecurity: No Food Insecurity  (02/06/2022)   Hunger Vital Sign    Worried About Running Out of Food in the Last Year: Never true    [place] in the Last Year: Never true  Transportation Needs: No Transportation Needs (02/06/2022)   PRAPARE - Hydrologist (Medical): No    Lack of Transportation (Non-Medical): No  Physical Activity: Sufficiently Active (02/06/2022)   Exercise Vital Sign    Days of Exercise per Week: 7 days    Minutes of Exercise per Session: 30 min  Stress: No Stress Concern Present (02/06/2022)   [place]    Feeling of Stress : Only a little  Social Connections: Moderately Isolated (02/06/2022)   Social Connection and Isolation Panel [NHANES]    Frequency of Communication with Friends and Family: More than three times a week    Frequency of Social Gatherings with Friends and Family: More than three times a week    Attends Religious Services: More than 4 times per year    Active Member of [employer] or Organizations: No    Attends Archivist Meetings: Never    Marital Status: Widowed  Intimate Partner Violence: Not At Risk (02/06/2022)   Humiliation, Afraid, Rape, and Kick questionnaire    Fear of Current or Ex-Partner: No    Emotionally Abused: No    Physically Abused: No    Sexually Abused: No    Outpatient Medications Prior to Visit  Medication Sig Dispense Refill   acetaminophen (TYLENOL) 500 MG tablet Take 500 mg by mouth as needed.     albuterol (PROVENTIL) (2.5 MG/3ML) 0.083% nebulizer solution Take 3 mLs (2.5 mg total) by nebulization every 6 (six) hours as needed for wheezing or shortness of breath. 75 mL 12   albuterol (VENTOLIN HFA) 108 (90 Base) MCG/ACT inhaler Inhale 2 puffs into the lungs every 6 (six) hours as needed for wheezing or shortness of breath. As needed only 18 g 3   amLODipine (NORVASC) 2.5 MG  tablet TAKE 1 TABLET(2.5 MG) BY MOUTH DAILY 90 tablet 1   bifidobacterium  infantis (ALIGN) capsule Take 1 capsule by mouth daily. 21 capsule 0   Calcium-Magnesium-Vitamin D (CALCIUM MAGNESIUM PO) Take 2 tablets by mouth daily.     carboxymethylcellulose (REFRESH PLUS) 0.5 % SOLN 1 drop 3 (three) times daily as needed.     cholecalciferol (VITAMIN D) 1000 units tablet Take 2,000 Units by mouth daily.     EPINEPHrine (EPIPEN 2-PAK) 0.3 mg/0.3 mL IJ SOAJ injection As directed 1 each 0   Esomeprazole Magnesium (NEXIUM 24HR PO) Take 1 capsule by mouth as needed.     famotidine (PEPCID) 20 MG tablet Take 20 mg by mouth daily.     Flaxseed, Linseed, (GROUND FLAX SEEDS PO)      fluticasone (FLONASE) 50 MCG/ACT nasal spray USE 2 SPRAYS IN EACH  NOSTRIL ONCE A DAY 48 g 3   hyoscyamine (LEVSIN SL) 0.125 MG SL tablet Place 1 tablet (0.125 mg total) under the tongue every 6 (six) hours as needed. 10 tablet 1   latanoprost (XALATAN) 0.005 % ophthalmic solution Place 1 drop into both eyes at bedtime.     metoprolol tartrate (LOPRESSOR) 25 MG tablet SCHEDULE OFFICE VISIT FOR FUTURE REFILLS 90 tablet 3   montelukast (SINGULAIR) 10 MG tablet TAKE 1 TABLET(10 MG) BY MOUTH AT BEDTIME 90 tablet 3   nystatin cream (MYCOSTATIN) Apply 1 application. topically 2 (two) times daily. 30 g 1   Polyethyl Glycol-Propyl Glycol (SYSTANE) 0.4-0.3 % GEL ophthalmic gel Place 1 application into both eyes.     sodium chloride (OCEAN) 0.65 % SOLN nasal spray Place 1 spray into both nostrils as needed for congestion.     triamcinolone (KENALOG) 0.1 % Apply 1 application topically 2 (two) times daily. 90 g 3   Vitamin Mixture (ESTER-C PO) Take 1,000 mg by mouth daily.     No facility-administered medications prior to visit.    Allergies  Allergen Reactions   Cephalosporins     Blisters  orally   Gabapentin     REACTION: rash   Levofloxacin     REACTION: stomach ache and rash   Psyllium     REACTION: rash   Sulfonamide Derivatives Anaphylaxis    REACTION: shock, urticaria   Aspirin     REACTION:  stomach pain   Belladonna    Ciprofloxacin Hcl    Conjugated Estrogens    Cymbopogon    Doxycycline     Abdominal pain   Flovent Hfa [Fluticasone]    Nabumetone    Nsaids    Soybean-Containing Drug Products Other (See Comments)    GI upset   Zicam Cold Remedy [Homeopathic Products]    Zocor [Simvastatin] Itching   Nickel Itching and Rash   Paba Derivatives Itching and Rash   Prednisone Rash    NO PROBLEM WITH MEDROL DOSE PAK Oral prednisone caused facial burning, "made my face feel like it was on fire"    Review of Systems  Constitutional:  Negative for fever and malaise/fatigue.  HENT:  Negative for congestion.   Eyes:  Negative for blurred vision.  Respiratory:  Negative for shortness of breath.   Cardiovascular:  Negative for chest pain, palpitations and leg swelling.  Gastrointestinal:  Negative for abdominal pain, blood in stool and nausea.  Genitourinary:  Negative for dysuria and frequency.  Musculoskeletal:  Positive for myalgias. Negative for falls.  Skin:  Negative for rash.       (+) new moles (+)discoloration  on right shin  Neurological:  Positive for tremors and weakness. Negative for dizziness, loss of consciousness and headaches.  Endo/Heme/Allergies:  Negative for environmental allergies.  Psychiatric/Behavioral:  Negative for depression. The patient is not nervous/anxious.        Objective:    Physical Exam Vitals and nursing note reviewed.  Constitutional:      General: She is not in acute distress.    Appearance: Normal appearance.  HENT:     Head: Normocephalic and atraumatic.     Right Ear: External ear normal.     Left Ear: External ear normal.  Eyes:     Extraocular Movements: Extraocular movements intact.     Pupils: Pupils are equal, round, and reactive to light.  Cardiovascular:     Rate and Rhythm: Normal rate and regular rhythm.     Heart sounds: Normal heart sounds. No murmur heard.    No gallop.  Pulmonary:     Effort: Pulmonary  effort is normal. No respiratory distress.     Breath sounds: Normal breath sounds. No wheezing or rales.  Musculoskeletal:     Comments: 5/5 strength in both upper and lower extremities  Skin:    General: Skin is warm and dry.  Neurological:     Mental Status: She is alert and oriented to person, place, and time.  Psychiatric:        Judgment: Judgment normal.     BP 130/80 (BP Location: Left Arm, Patient Position: Sitting, Cuff Size: Normal)   Pulse (!) 56   Temp 98.8 F (37.1 C) (Oral)   Resp 18   Ht 5' 0.6" (1.539 m)   Wt 145 lb 12.8 oz (66.1 kg)   SpO2 95%   BMI 27.91 kg/m  Wt Readings from Last 3 Encounters:  06/12/22 145 lb 12.8 oz (66.1 kg)  06/09/22 141 lb (64 kg)  03/20/22 149 lb (67.6 kg)    Diabetic Foot Exam - Simple   No data filed    Lab Results  Component Value Date   WBC 5.2 06/09/2022   HGB 14.7 06/09/2022   HCT 44.3 06/09/2022   PLT 172 06/09/2022   GLUCOSE 110 (H) 06/09/2022   CHOL 245 (H) 01/26/2022   TRIG 115.0 01/26/2022   HDL 63.60 01/26/2022   LDLDIRECT 153.3 12/18/2012   LDLCALC 159 (H) 01/26/2022   ALT 19 06/09/2022   AST 30 06/09/2022   NA 136 06/09/2022   K 4.0 06/09/2022   CL 101 06/09/2022   CREATININE 0.65 06/09/2022   BUN 14 06/09/2022   CO2 26 06/09/2022   TSH 2.640 08/25/2020   INR 1.2 06/09/2022   HGBA1C 5.4 11/01/2011    Lab Results  Component Value Date   TSH 2.640 08/25/2020   Lab Results  Component Value Date   WBC 5.2 06/09/2022   HGB 14.7 06/09/2022   HCT 44.3 06/09/2022   MCV 92.3 06/09/2022   PLT 172 06/09/2022   Lab Results  Component Value Date   NA 136 06/09/2022   K 4.0 06/09/2022   CO2 26 06/09/2022   GLUCOSE 110 (H) 06/09/2022   BUN 14 06/09/2022   CREATININE 0.65 06/09/2022   BILITOT 0.5 06/09/2022   ALKPHOS 102 06/09/2022   AST 30 06/09/2022   ALT 19 06/09/2022   PROT 8.3 (H) 06/09/2022   ALBUMIN 4.7 06/09/2022   CALCIUM 9.4 06/09/2022   ANIONGAP 9 06/09/2022   GFR 84.92  01/26/2022   Lab Results  Component Value Date  CHOL 245 (H) 01/26/2022   Lab Results  Component Value Date   HDL 63.60 01/26/2022   Lab Results  Component Value Date   LDLCALC 159 (H) 01/26/2022   Lab Results  Component Value Date   TRIG 115.0 01/26/2022   Lab Results  Component Value Date   CHOLHDL 4 01/26/2022   Lab Results  Component Value Date   HGBA1C 5.4 11/01/2011       Assessment & Plan:   Problem List Items Addressed This Visit       Unprioritized   Weakness    Er visit reviewed  Er referred her to neuro       Relevant Orders   CBC with Differential/Platelet   Comprehensive metabolic panel   Vitamin M42   VITAMIN D 25 Hydroxy (Vit-D Deficiency, Fractures)   TSH   Vitamin D deficiency    Recheck today      Bilateral low back pain with sciatica - Primary    Hx surgery ---- Dr Carloyn Manner has retired , pt was told by him to f/u with Dr Ellene Route if she had trouble Check xray  Referral placed       Relevant Orders   DG Thoracic Spine 2 View   DG Lumbar Spine Complete   DG Sacrum/Coccyx   Ambulatory referral to Neurosurgery   CBC with Differential/Platelet   Comprehensive metabolic panel   Vitamin A83   VITAMIN D 25 Hydroxy (Vit-D Deficiency, Fractures)   TSH   Other Visit Diagnoses     Need for influenza vaccination       Relevant Orders   Flu Vaccine QUAD High Dose(Fluad) (Completed)   Suspicious nevus       Relevant Orders   Ambulatory referral to Dermatology        No orders of the defined types were placed in this encounter.   IAnn Held, DO, personally preformed the services described in this documentation.  All medical record entries made by the scribe were at my direction and in my presence.  I have reviewed the chart and discharge instructions (if applicable) and agree that the record reflects my personal performance and is accurate and complete. 06/12/2022   I,[name],acting as a scribe for [name], DO.,have documented all relevant documentation on the behalf of [name], DO,as directed by  [name], DO while in the presence of [name], DO.   [name], DO

## 2022-06-12 NOTE — Assessment & Plan Note (Signed)
Hx surgery ---- Dr Carloyn Manner has retired , pt was told by him to f/u with Dr Ellene Route if she had trouble Check xray  Referral placed

## 2022-06-12 NOTE — Patient Instructions (Signed)
Weakness Weakness is a lack of strength. You may feel weak all over your body (generalized), or you may feel weak in one part of your body (focal). Common causes of weakness include: Infection and disorders of the body's defense system (immune system). Physical exhaustion. Internal bleeding or other blood loss that results in a lack of red blood cells (anemia). Dehydration. An imbalance in mineral (electrolyte) levels, such as potassium. Chronic kidney or liver disease. Cancer. Other causes include: Some medicines or cancer treatment. Stress, anxiety, or depression. Heart disease, circulation problems, or stroke. Nervous system disorders. Thyroid disorders. Loss of muscle strength because of age or inactivity. Poor sleep quality or sleep disorders. The cause of your weakness may not be known. Some causes of weakness can be serious, so it is important to see your health care provider. Follow these instructions at home: Activity Rest as needed. Try to get enough sleep. Most adults need 7-8 hours of quality sleep each night. Talk to your health care provider about how much sleep you need. Do exercises, such as arm curls and leg raises, for 30 minutes at least 2 days a week or as told by your health care provider. This helps build muscle strength. Consider working with a physical therapist or trainer who can develop an exercise plan to help you gain muscle strength. General instructions  Take over-the-counter and prescription medicines only as told by your health care provider. Eat a healthy, well-balanced diet. This includes: Proteins to build muscles, such as lean meats and fish. Fresh fruits and vegetables. Carbohydrates to boost energy, such as whole grains. Drink enough fluid to keep your urine pale yellow. Keep all follow-up visits. This is important. Contact a health care provider if: Your weakness does not improve or gets worse. Your weakness affects your ability to think  clearly. Your weakness affects your ability to do your normal daily activities. Get help right away if: You develop sudden weakness, especially on one side of your face or body. You have chest pain. You have trouble breathing or shortness of breath. You have problems with your vision. You have trouble talking or swallowing. You have trouble standing or walking. You are light-headed or lose consciousness. These symptoms may be an emergency. Get help right away. Call 911. Do not wait to see if the symptoms will go away. Do not drive yourself to the hospital. Summary Weakness is a lack of strength. You may feel weak all over your body or just in one specific part of your body. Weakness can be caused by a variety of things. In some cases, the cause may be unknown. Rest as needed, and try to get enough sleep. Most adults need 7-8 hours of quality sleep each night. Eat a healthy, well-balanced diet. This information is not intended to replace advice given to you by your health care provider. Make sure you discuss any questions you have with your health care provider. Document Revised: 07/30/2021 Document Reviewed: 07/30/2021 Elsevier Patient Education  2023 [employer]  

## 2022-06-12 NOTE — Assessment & Plan Note (Signed)
Recheck today. 

## 2022-06-13 ENCOUNTER — Ambulatory Visit: Payer: Medicare Other | Admitting: Diagnostic Neuroimaging

## 2022-06-13 VITALS — BP 128/64 | HR 48 | Ht 60.0 in | Wt 145.2 lb

## 2022-06-13 DIAGNOSIS — R531 Weakness: Secondary | ICD-10-CM | POA: Diagnosis not present

## 2022-06-13 LAB — CBC WITH DIFFERENTIAL/PLATELET
Basophils Absolute: 0 10*3/uL (ref 0.0–0.1)
Basophils Relative: 0.8 % (ref 0.0–3.0)
Eosinophils Absolute: 0.1 10*3/uL (ref 0.0–0.7)
Eosinophils Relative: 1.1 % (ref 0.0–5.0)
HCT: 38.9 % (ref 36.0–46.0)
Hemoglobin: 13.3 g/dL (ref 12.0–15.0)
Lymphocytes Relative: 13.1 % (ref 12.0–46.0)
Lymphs Abs: 0.8 10*3/uL (ref 0.7–4.0)
MCHC: 34.1 g/dL (ref 30.0–36.0)
MCV: 92.8 fl (ref 78.0–100.0)
Monocytes Absolute: 0.4 10*3/uL (ref 0.1–1.0)
Monocytes Relative: 7.2 % (ref 3.0–12.0)
Neutro Abs: 4.6 10*3/uL (ref 1.4–7.7)
Neutrophils Relative %: 77.8 % — ABNORMAL HIGH (ref 43.0–77.0)
Platelets: 135 10*3/uL — ABNORMAL LOW (ref 150.0–400.0)
RBC: 4.19 Mil/uL (ref 3.87–5.11)
RDW: 13.7 % (ref 11.5–15.5)
WBC: 5.9 10*3/uL (ref 4.0–10.5)

## 2022-06-13 LAB — COMPREHENSIVE METABOLIC PANEL
ALT: 14 U/L (ref 0–35)
AST: 19 U/L (ref 0–37)
Albumin: 4.4 g/dL (ref 3.5–5.2)
Alkaline Phosphatase: 95 U/L (ref 39–117)
BUN: 18 mg/dL (ref 6–23)
CO2: 28 mEq/L (ref 19–32)
Calcium: 9.6 mg/dL (ref 8.4–10.5)
Chloride: 101 mEq/L (ref 96–112)
Creatinine, Ser: 0.71 mg/dL (ref 0.40–1.20)
GFR: 82.39 mL/min (ref >60.00)
Glucose, Bld: 87 mg/dL (ref 70–99)
Potassium: 4.1 mEq/L (ref 3.5–5.1)
Sodium: 138 mEq/L (ref 135–145)
Total Bilirubin: 0.5 mg/dL (ref 0.2–1.2)
Total Protein: 6.8 g/dL (ref 6.0–8.3)

## 2022-06-13 LAB — VITAMIN D 25 HYDROXY (VIT D DEFICIENCY, FRACTURES): VITD: 38.84 ng/mL (ref 30.00–100.00)

## 2022-06-13 LAB — VITAMIN B12: Vitamin B-12: 271 pg/mL (ref 211–911)

## 2022-06-13 LAB — TSH: TSH: 3.19 u[IU]/mL (ref 0.35–5.50)

## 2022-06-13 NOTE — Progress Notes (Unsigned)
[facility]  PATIENT: Kayla [name] DOB: 1946-07-27  REFERRING CLINICIAN: [name], * HISTORY FROM: patient REASON FOR VISIT: new consult   HISTORICAL  CHIEF COMPLAINT:  Chief Complaint  Patient presents with   New Patient (Initial Visit)    Pt is well. She states that she would wake up from a deep sleep shaking thinking that she was cold. She states the other day her upper body was shaking. Room 6 alone    HISTORY OF PRESENT ILLNESS:   76 year old female here for evaluation of abnormal sensation in legs.  Patient feels shaking and weakness sensation in bilateral legs.  Sometimes feels an similar sensation in upper extremities.  Mild change in gait and balance.  No numbness or tingling.  Symptoms slightly worse with exertion.   REVIEW OF SYSTEMS: Full 14 system review of systems performed and negative with exception of: as per HPI.  ALLERGIES: Allergies  Allergen Reactions   Cephalosporins     Blisters  orally   Gabapentin     REACTION: rash   Levofloxacin     REACTION: stomach ache and rash   Psyllium     REACTION: rash   Sulfonamide Derivatives Anaphylaxis    REACTION: shock, urticaria   Aspirin     REACTION: stomach pain   Belladonna    Ciprofloxacin Hcl    Conjugated Estrogens    Cymbopogon    Doxycycline     Abdominal pain   Flovent Hfa [Fluticasone]    Nabumetone    Nsaids    Soybean-Containing Drug Products Other (See Comments)    GI upset   Zicam Cold Remedy [Homeopathic Products]    Zocor [Simvastatin] Itching   Nickel Itching and Rash   Paba Derivatives Itching and Rash   Prednisone Rash    NO PROBLEM WITH MEDROL DOSE PAK Oral prednisone caused facial burning, "made my face feel like it was on fire"    HOME MEDICATIONS: Outpatient Medications Prior to Visit  Medication Sig Dispense Refill   acetaminophen (TYLENOL) 500 MG tablet Take 500 mg by mouth as needed.     albuterol (PROVENTIL) (2.5 MG/3ML) 0.083%  nebulizer solution Take 3 mLs (2.5 mg total) by nebulization every 6 (six) hours as needed for wheezing or shortness of breath. 75 mL 12   albuterol (VENTOLIN HFA) 108 (90 Base) MCG/ACT inhaler Inhale 2 puffs into the lungs every 6 (six) hours as needed for wheezing or shortness of breath. As needed only 18 g 3   amLODipine (NORVASC) 2.5 MG tablet TAKE 1 TABLET(2.5 MG) BY MOUTH DAILY 90 tablet 1   bifidobacterium infantis (ALIGN) capsule Take 1 capsule by mouth daily. 21 capsule 0   Calcium-Magnesium-Vitamin D (CALCIUM MAGNESIUM PO) Take 2 tablets by mouth daily.     carboxymethylcellulose (REFRESH PLUS) 0.5 % SOLN 1 drop 3 (three) times daily as needed.     cholecalciferol (VITAMIN D) 1000 units tablet Take 2,000 Units by mouth daily.     EPINEPHrine (EPIPEN 2-PAK) 0.3 mg/0.3 mL IJ SOAJ injection As directed 1 each 0   Esomeprazole Magnesium (NEXIUM 24HR PO) Take 1 capsule by mouth as needed.     famotidine (PEPCID) 20 MG tablet Take 20 mg by mouth daily.     Flaxseed, Linseed, (GROUND FLAX SEEDS PO)      fluticasone (FLONASE) 50 MCG/ACT nasal spray USE 2 SPRAYS IN EACH  NOSTRIL ONCE A DAY 48 g 3   hyoscyamine (LEVSIN SL) 0.125 MG SL tablet Place  1 tablet (0.125 mg total) under the tongue every 6 (six) hours as needed. 10 tablet 1   latanoprost (XALATAN) 0.005 % ophthalmic solution Place 1 drop into both eyes at bedtime.     metoprolol tartrate (LOPRESSOR) 25 MG tablet SCHEDULE OFFICE VISIT FOR FUTURE REFILLS 90 tablet 3   montelukast (SINGULAIR) 10 MG tablet TAKE 1 TABLET(10 MG) BY MOUTH AT BEDTIME 90 tablet 3   nystatin cream (MYCOSTATIN) Apply 1 application. topically 2 (two) times daily. 30 g 1   Polyethyl Glycol-Propyl Glycol (SYSTANE) 0.4-0.3 % GEL ophthalmic gel Place 1 application into both eyes.     sodium chloride (OCEAN) 0.65 % SOLN nasal spray Place 1 spray into both nostrils as needed for congestion.     triamcinolone (KENALOG) 0.1 % Apply 1 application topically 2 (two) times daily.  90 g 3   Vitamin Mixture (ESTER-C PO) Take 1,000 mg by mouth daily.     No facility-administered medications prior to visit.    PAST MEDICAL HISTORY: Past Medical History:  Diagnosis Date   Abscess of left groin 11/10/2014   Allergy    Asthma    Cellulitis 10/20/2014   Diverticulitis    Diverticulosis    Fasting hyperglycemia 04/2011   FBS 108   Fibromyalgia    Fundic gland polyps of stomach, benign    GERD (gastroesophageal reflux disease)    gastric polyp x3   Glaucoma     Dr Bing Plume   Hypertension    IBS (irritable bowel syndrome)    Osteopenia    last 07/2011   Pneumonia     OP as child   Shingles     PAST SURGICAL HISTORY: Past Surgical History:  Procedure Laterality Date   COLONOSCOPY W/ POLYPECTOMY  1993,2002,2007   last  colonoscopy 2007, Dr Carlean Purl   DILATION AND CURETTAGE OF UTERUS     ESOPHAGOGASTRODUODENOSCOPY     SPINE SURGERY     T9-L5 fusions    FAMILY HISTORY: Family History  Problem Relation Age of Onset   Breast cancer Mother    Heart failure Father        CHF, CABG late 40s   Breast cancer Sister    Breast cancer Maternal Aunt        two   Heart disease Maternal Aunt    Breast cancer Paternal Aunt    Asthma Paternal Aunt    Coronary artery disease Paternal Aunt        triple CABG   Stomach cancer Paternal Uncle    Heart attack Paternal Uncle         MI in 11s   Kidney disease Maternal Grandmother    Heart attack Paternal Grandmother        MI in late 48s   Heart disease Paternal Grandfather    Pulmonary embolism Son    Deep vein thrombosis Son    Heart Problems Son        Heart attacks related to a "blood disorder"    Ovarian cancer Other        Niece x2   Breast cancer Other        niece   Diabetes Neg Hx    Stroke Neg Hx    COPD Neg Hx    Colon polyps Neg Hx    Colon cancer Neg Hx    Esophageal cancer Neg Hx    Rectal cancer Neg Hx     SOCIAL HISTORY: Social History   Socioeconomic History   Marital  status:  Widowed    Spouse name: Not on file   Number of children: Not on file   Years of education: Not on file   Highest education level: Not on file  Occupational History   Occupation: retired  Tobacco Use   Smoking status: Former    Packs/day: 0.50    Years: 10.00    Total pack years: 5.00    Types: Cigarettes    Quit date: 09/10/1974    Years since quitting: 47.7   Smokeless tobacco: Never   Tobacco comments:    smoked 1966- 1976, up to 1 ppd  Vaping Use   Vaping Use: Never used  Substance and Sexual Activity   Alcohol use: No    Alcohol/week: 0.0 standard drinks of alcohol   Drug use: No   Sexual activity: Yes  Other Topics Concern   Not on file  Social History Narrative   Daily caffeine 3 cups   Regular exercise   Married         Social Determinants of Health   Financial Resource Strain: Low Risk  (02/06/2022)   Overall Financial Resource Strain (CARDIA)    Difficulty of Paying Living Expenses: Not hard at all  Food Insecurity: No Food Insecurity (02/06/2022)   Hunger Vital Sign    Worried About Running Out of Food in the Last Year: Never true    [place] in the Last Year: Never true  Transportation Needs: No Transportation Needs (02/06/2022)   PRAPARE - Hydrologist (Medical): No    Lack of Transportation (Non-Medical): No  Physical Activity: Sufficiently Active (02/06/2022)   Exercise Vital Sign    Days of Exercise per Week: 7 days    Minutes of Exercise per Session: 30 min  Stress: No Stress Concern Present (02/06/2022)   [place]    Feeling of Stress : Only a little  Social Connections: Moderately Isolated (02/06/2022)   Social Connection and Isolation Panel [NHANES]    Frequency of Communication with Friends and Family: More than three times a week    Frequency of Social Gatherings with Friends and Family: More than three times a week    Attends Religious  Services: More than 4 times per year    Active Member of [employer] or Organizations: No    Attends Archivist Meetings: Never    Marital Status: Widowed  Intimate Partner Violence: Not At Risk (02/06/2022)   Humiliation, Afraid, Rape, and Kick questionnaire    Fear of Current or Ex-Partner: No    Emotionally Abused: No    Physically Abused: No    Sexually Abused: No     PHYSICAL EXAM  GENERAL EXAM/CONSTITUTIONAL: Vitals:  Vitals:   06/13/22 1135  BP: 128/64  Pulse: (!) 48  Weight: 145 lb 4 oz (65.9 kg)  Height: 5' (1.524 m)   Body mass index is 28.37 kg/m. Wt Readings from Last 3 Encounters:  06/13/22 145 lb 4 oz (65.9 kg)  06/12/22 145 lb 12.8 oz (66.1 kg)  06/09/22 141 lb (64 kg)   Patient is in no distress; well developed, nourished and groomed; neck is supple  CARDIOVASCULAR: Examination of carotid arteries is normal; no carotid bruits Regular rate and rhythm, no murmurs Examination of peripheral vascular system by observation and palpation is normal  EYES: Ophthalmoscopic exam of optic discs and posterior segments is normal; no papilledema or hemorrhages No results found.  MUSCULOSKELETAL: Gait,  strength, tone, movements noted in Neurologic exam below  NEUROLOGIC: MENTAL STATUS:     07/17/2017   11:31 AM  MMSE - Mini Mental State Exam  Orientation to time 5  Orientation to Place 5  Registration 3  Attention/ Calculation 5  Recall 3  Language- name 2 objects 2  Language- repeat 1  Language- follow 3 step command 3  Language- read & follow direction 1  Write a sentence 1  Copy design 1  Total score 30   awake, alert, oriented to person, place and time recent and remote memory intact normal attention and concentration language fluent, comprehension intact, naming intact fund of knowledge appropriate  CRANIAL NERVE:  2nd - no papilledema on fundoscopic exam 2nd, 3rd, 4th, 6th - pupils equal and reactive to light, visual fields full to  confrontation, extraocular muscles intact, no nystagmus 5th - facial sensation symmetric 7th - facial strength symmetric 8th - hearing intact 9th - palate elevates symmetrically, uvula midline 11th - shoulder shrug symmetric 12th - tongue protrusion midline  MOTOR:  normal bulk and tone, full strength in the BUE, BLE  SENSORY:  normal and symmetric to light touch, temperature, vibration  COORDINATION:  finger-nose-finger, fine finger movements normal  REFLEXES:  deep tendon reflexes TRACE and symmetric  GAIT/STATION:  narrow based gait; SLIGHTLY CAUTIOUS; SLIGHTLY LIMPING     DIAGNOSTIC DATA (LABS, IMAGING, TESTING) - I reviewed patient records, labs, notes, testing and imaging myself where available.  Lab Results  Component Value Date   WBC 5.9 06/12/2022   HGB 13.3 06/12/2022   HCT 38.9 06/12/2022   MCV 92.8 06/12/2022   PLT 135.0 (L) 06/12/2022      Component Value Date/Time   NA 138 06/12/2022 1412   NA 137 10/03/2020 1517   K 4.1 06/12/2022 1412   CL 101 06/12/2022 1412   CO2 28 06/12/2022 1412   GLUCOSE 87 06/12/2022 1412   BUN 18 06/12/2022 1412   BUN 15 10/03/2020 1517   CREATININE 0.71 06/12/2022 1412   CREATININE 0.74 06/20/2020 1017   CALCIUM 9.6 06/12/2022 1412   PROT 6.8 06/12/2022 1412   ALBUMIN 4.4 06/12/2022 1412   AST 19 06/12/2022 1412   ALT 14 06/12/2022 1412   ALKPHOS 95 06/12/2022 1412   BILITOT 0.5 06/12/2022 1412   GFRNONAA >60 06/09/2022 1935   GFRNONAA 77 01/04/2017 1422   GFRAA 99 10/03/2020 1517   GFRAA 88 01/04/2017 1422   Lab Results  Component Value Date   CHOL 245 (H) 01/26/2022   HDL 63.60 01/26/2022   LDLCALC 159 (H) 01/26/2022   LDLDIRECT 153.3 12/18/2012   TRIG 115.0 01/26/2022   CHOLHDL 4 01/26/2022   Lab Results  Component Value Date   HGBA1C 5.4 11/01/2011   Lab Results  Component Value Date   VITAMINB12 271 06/12/2022   Lab Results  Component Value Date   TSH 3.19 06/12/2022     Xray lumbar  1.  No fracture or acute finding. 2. Levoscoliosis as well degenerative and postsurgical changes as detailed, stable compared to the prior abdomen and pelvis CT 01/10/2022.     ASSESSMENT AND PLAN  76 y.o. year old female here with:    Dx:  1. Weakness       PLAN:   SUBJECTIVE WEAKNESS / TREMOR (internal sensations; mainly in legs) -Neuro exam is relatively unremarkable ; will check labs and recommend conservative management for now with monitoring.    Orders Placed This Encounter  Procedures   AChR  Abs with Reflex to MuSK   CK   Aldolase   Return for pending if symptoms worsen or fail to improve.    Penni Bombard, MD 01/13/9793, 80:16 PM Certified in Neurology, Neurophysiology and Neuroimaging  [facility] [address] [place], [place] [address] [phone]

## 2022-06-14 ENCOUNTER — Encounter: Payer: Self-pay | Admitting: Diagnostic Neuroimaging

## 2022-06-21 ENCOUNTER — Encounter: Payer: Self-pay | Admitting: Family Medicine

## 2022-06-21 ENCOUNTER — Other Ambulatory Visit: Payer: Self-pay | Admitting: Family Medicine

## 2022-06-21 DIAGNOSIS — I10 Essential (primary) hypertension: Secondary | ICD-10-CM

## 2022-06-21 MED ORDER — METOPROLOL TARTRATE 25 MG PO TABS: ORAL_TABLET | ORAL | 3 refills | Status: DC

## 2022-06-27 LAB — CK: Total CK: 100 U/L (ref 32–182)

## 2022-06-27 LAB — ALDOLASE: Aldolase: 4.4 U/L (ref 3.3–10.3)

## 2022-06-27 LAB — ACHR ABS WITH REFLEX TO MUSK: AChR Binding Ab, Serum: <0.03 nmol/L (ref 0.00–0.24)

## 2022-06-27 LAB — MUSK ANTIBODIES: MuSK Antibodies: <1 U/mL

## 2022-07-05 ENCOUNTER — Other Ambulatory Visit: Payer: Self-pay | Admitting: Family Medicine

## 2022-07-05 DIAGNOSIS — R21 Rash and other nonspecific skin eruption: Secondary | ICD-10-CM

## 2022-07-17 DIAGNOSIS — Z1231 Encounter for screening mammogram for malignant neoplasm of breast: Secondary | ICD-10-CM | POA: Diagnosis not present

## 2022-07-17 LAB — HM MAMMOGRAPHY

## 2022-07-20 ENCOUNTER — Encounter: Payer: Self-pay | Admitting: *Deleted

## 2022-07-25 DIAGNOSIS — D1723 Benign lipomatous neoplasm of skin and subcutaneous tissue of right leg: Secondary | ICD-10-CM | POA: Diagnosis not present

## 2022-07-25 DIAGNOSIS — D1724 Benign lipomatous neoplasm of skin and subcutaneous tissue of left leg: Secondary | ICD-10-CM | POA: Diagnosis not present

## 2022-07-25 DIAGNOSIS — D485 Neoplasm of uncertain behavior of skin: Secondary | ICD-10-CM | POA: Diagnosis not present

## 2022-07-25 DIAGNOSIS — L820 Inflamed seborrheic keratosis: Secondary | ICD-10-CM | POA: Diagnosis not present

## 2022-08-10 DIAGNOSIS — M5442 Lumbago with sciatica, left side: Secondary | ICD-10-CM | POA: Diagnosis not present

## 2022-08-10 DIAGNOSIS — Z981 Arthrodesis status: Secondary | ICD-10-CM | POA: Diagnosis not present

## 2022-08-10 DIAGNOSIS — Z8739 Personal history of other diseases of the musculoskeletal system and connective tissue: Secondary | ICD-10-CM | POA: Diagnosis not present

## 2022-08-10 DIAGNOSIS — M5441 Lumbago with sciatica, right side: Secondary | ICD-10-CM | POA: Diagnosis not present

## 2022-08-27 ENCOUNTER — Other Ambulatory Visit: Payer: Self-pay | Admitting: Family Medicine

## 2022-08-27 DIAGNOSIS — I10 Essential (primary) hypertension: Secondary | ICD-10-CM

## 2022-09-20 DIAGNOSIS — H2513 Age-related nuclear cataract, bilateral: Secondary | ICD-10-CM | POA: Diagnosis not present

## 2022-09-20 DIAGNOSIS — H04123 Dry eye syndrome of bilateral lacrimal glands: Secondary | ICD-10-CM | POA: Diagnosis not present

## 2022-09-20 DIAGNOSIS — H401131 Primary open-angle glaucoma, bilateral, mild stage: Secondary | ICD-10-CM | POA: Diagnosis not present

## 2022-10-05 ENCOUNTER — Other Ambulatory Visit: Payer: Self-pay

## 2022-10-05 ENCOUNTER — Telehealth: Payer: Self-pay | Admitting: Family Medicine

## 2022-10-05 DIAGNOSIS — T7840XD Allergy, unspecified, subsequent encounter: Secondary | ICD-10-CM

## 2022-10-05 MED ORDER — MONTELUKAST SODIUM 10 MG PO TABS: ORAL_TABLET | ORAL | 1 refills | Status: DC

## 2022-10-05 NOTE — Telephone Encounter (Signed)
Refill sent.

## 2022-10-05 NOTE — Telephone Encounter (Signed)
Prescription Request  10/05/2022  Is this a "Controlled Substance" medicine? Yes  LOV: 06/12/2022  What is the name of the medication or equipment?  montelukast (SINGULAIR) 10 MG tablet   Have you contacted your pharmacy to request a refill? yes  Which pharmacy would you like this sent to?  [facility] [place], [address] [place] [address] Phone: [phone] Fax: [phone]   Patient notified that their request is being sent to the clinical staff for review and that they should receive a response within 2 business days.   Please advise at Mobile [phone] (mobile)

## 2022-11-01 ENCOUNTER — Encounter: Payer: Self-pay | Admitting: Family Medicine

## 2022-11-01 ENCOUNTER — Other Ambulatory Visit: Payer: Self-pay | Admitting: Family Medicine

## 2022-11-01 DIAGNOSIS — M1990 Unspecified osteoarthritis, unspecified site: Secondary | ICD-10-CM

## 2022-11-02 ENCOUNTER — Encounter: Payer: Self-pay | Admitting: Family Medicine

## 2022-11-13 ENCOUNTER — Ambulatory Visit: Payer: Medicare Other | Admitting: Podiatry

## 2022-11-13 ENCOUNTER — Encounter: Payer: Self-pay | Admitting: Podiatry

## 2022-11-13 DIAGNOSIS — M21611 Bunion of right foot: Secondary | ICD-10-CM | POA: Diagnosis not present

## 2022-11-13 DIAGNOSIS — M21612 Bunion of left foot: Secondary | ICD-10-CM | POA: Diagnosis not present

## 2022-11-13 DIAGNOSIS — L84 Corns and callosities: Secondary | ICD-10-CM

## 2022-11-13 NOTE — Progress Notes (Signed)
Chief Complaint  Patient presents with   Callouses     Right foot callus pain    Nail Problem     Routine foot care     HPI: Patient is 77 y.o. female who presents today for  a painful callus on her right foot near her great toe joint as well as a lesion on her second toe.   She relates she has noticed the areas growing and they are bothersome to her.  She has tried no self treatment.   Patient Active Problem List   Diagnosis Date Noted   Bilateral low back pain with sciatica 06/12/2022   Weakness 06/12/2022   Pulmonary nodule less than 1 cm in diameter with low risk for malignant neoplasm 03/14/2021   Asthmatic bronchitis 03/09/2020   Allergies 01/18/2020   Preventative health care 03/29/2019   Pneumonia of left lower lobe due to infectious organism 09/01/2018   Vaginal atrophy 03/24/2018   Seasonal allergies 02/08/2018   Erythema nodosum 06/09/2016   Herpes zoster 09/23/2013   Superficial bruising of finger 02/16/2013   Hyperlipidemia 11/01/2011   Palpitations 05/23/2011   Essential hypertension 04/23/2011   Cough 04/09/2011   THYROID FUNCTION TEST, ABNORMAL 12/20/2009   COLONIC POLYPS, HX OF 11/08/2008   Asthma 01/21/2008   GERD 01/21/2008   IRRITABLE BOWEL SYNDROME 01/21/2008   FIBROMYALGIA 01/21/2008   Vitamin D deficiency 04/02/2007   DEGENERATIVE DISC DISEASE 04/02/2007   GLAUCOMA, LOW TENSION 02/06/2007   Osteoporosis 02/06/2007    Current Outpatient Medications on File Prior to Visit  Medication Sig Dispense Refill   acetaminophen (TYLENOL) 500 MG tablet Take 500 mg by mouth as needed.     albuterol (PROVENTIL) (2.5 MG/3ML) 0.083% nebulizer solution Take 3 mLs (2.5 mg total) by nebulization every 6 (six) hours as needed for wheezing or shortness of breath. 75 mL 12   albuterol (VENTOLIN HFA) 108 (90 Base) MCG/ACT inhaler Inhale 2 puffs into the lungs every 6 (six) hours as needed for wheezing or shortness of breath. As needed only 18 g 3   amLODipine  (NORVASC) 2.5 MG tablet Take 1 tablet (2.5 mg total) by mouth daily. 90 tablet 1   bifidobacterium infantis (ALIGN) capsule Take 1 capsule by mouth daily. 21 capsule 0   Calcium-Magnesium-Vitamin D (CALCIUM MAGNESIUM PO) Take 2 tablets by mouth daily.     carboxymethylcellulose (REFRESH PLUS) 0.5 % SOLN 1 drop 3 (three) times daily as needed.     cholecalciferol (VITAMIN D) 1000 units tablet Take 2,000 Units by mouth daily.     EPINEPHrine (EPIPEN 2-PAK) 0.3 mg/0.3 mL IJ SOAJ injection As directed 1 each 0   Esomeprazole Magnesium (NEXIUM 24HR PO) Take 1 capsule by mouth as needed.     famotidine (PEPCID) 20 MG tablet Take 20 mg by mouth daily.     Flaxseed, Linseed, (GROUND FLAX SEEDS PO)      fluticasone (FLONASE) 50 MCG/ACT nasal spray USE 2 SPRAYS IN EACH  NOSTRIL ONCE A DAY 48 g 3   hyoscyamine (LEVSIN SL) 0.125 MG SL tablet Place 1 tablet (0.125 mg total) under the tongue every 6 (six) hours as needed. 10 tablet 1   latanoprost (XALATAN) 0.005 % ophthalmic solution Place 1 drop into both eyes at bedtime.     metoprolol tartrate (LOPRESSOR) 25 MG tablet 1 po bid 180 tablet 3   montelukast (SINGULAIR) 10 MG tablet TAKE 1 TABLET(10 MG) BY MOUTH AT BEDTIME 90 tablet 1   nystatin cream (MYCOSTATIN) APPLY TOPICALLY TO  THE AFFECTED AREA TWICE DAILY 30 g 1   Polyethyl Glycol-Propyl Glycol (SYSTANE) 0.4-0.3 % GEL ophthalmic gel Place 1 application into both eyes.     sodium chloride (OCEAN) 0.65 % SOLN nasal spray Place 1 spray into both nostrils as needed for congestion.     triamcinolone (KENALOG) 0.1 % Apply 1 application topically 2 (two) times daily. 90 g 3   Vitamin Mixture (ESTER-C PO) Take 1,000 mg by mouth daily.     No current facility-administered medications on file prior to visit.    Allergies  Allergen Reactions   Cephalosporins     Blisters  orally   Gabapentin     REACTION: rash   Levofloxacin     REACTION: stomach ache and rash   Psyllium     REACTION: rash    Sulfonamide Derivatives Anaphylaxis    REACTION: shock, urticaria   Aspirin     REACTION: stomach pain   Belladonna    Ciprofloxacin Hcl    Conjugated Estrogens    Cymbopogon    Doxycycline     Abdominal pain   Flovent Hfa [Fluticasone]    Nabumetone    Nsaids    Soybean-Containing Drug Products Other (See Comments)    GI upset   Zicam Cold Remedy [Homeopathic Products]    Zocor [Simvastatin] Itching   Nickel Itching and Rash   Paba Derivatives Itching and Rash   Prednisone Rash    NO PROBLEM WITH MEDROL DOSE PAK Oral prednisone caused facial burning, "made my face feel like it was on fire"    Review of Systems No fevers, chills, nausea, muscle aches, no difficulty breathing, no calf pain, no chest pain or shortness of breath.   Physical Exam  GENERAL APPEARANCE: Alert, conversant. Appropriately groomed. No acute distress.   VASCULAR: Pedal pulses palpable DP and PT bilateral.  Capillary refill time is immediate to all digits,  Proximal to distal cooling it warm to warm.  Digital perfusion adequate.   NEUROLOGIC: sensation is intact to 5.07 monofilament at 5/5 sites bilateral.  Light touch is intact bilateral, vibratory sensation intact bilateral  MUSCULOSKELETAL: acceptable muscle strength, tone and stability bilateral.  Pes planus foot type is noted.  Moderate bunion prominence is noted on the left foot.  Decreased range of motion of the first MPJ is noted right greater than left.    DERMATOLOGIC: skin is warm, supple, and dry.  Color, texture, and turgor of skin within normal limits.  No open wounds are noted.  Nails are asymptomatic at todays visit   Large callus is present on the right hallux plantarly.  Diffuse hyperkeratotic tissue is seen. Skin tension lines are noted.      Assessment     ICD-10-CM   1. Callus  L84     2. Bilateral bunions  M21.611    M21.612        Plan  -Discussed corns and calluses with patient and treatment options.   -Hyperkeratotic tissue was debrided with chisel without incident. As courtesy -Encouraged daily moisturizing -Discussed use of pumice stone -Advised good supportive shoes and inserts -Toe cap provided to help with buniion area.  -Patient to return to office as needed or sooner if condition worsens.

## 2022-12-03 ENCOUNTER — Other Ambulatory Visit: Payer: Self-pay | Admitting: Family Medicine

## 2022-12-03 DIAGNOSIS — J4520 Mild intermittent asthma, uncomplicated: Secondary | ICD-10-CM

## 2022-12-17 DIAGNOSIS — M720 Palmar fascial fibromatosis [Dupuytren]: Secondary | ICD-10-CM | POA: Diagnosis not present

## 2022-12-17 DIAGNOSIS — I7300 Raynaud's syndrome without gangrene: Secondary | ICD-10-CM | POA: Diagnosis not present

## 2022-12-17 DIAGNOSIS — M5136 Other intervertebral disc degeneration, lumbar region: Secondary | ICD-10-CM | POA: Diagnosis not present

## 2022-12-17 DIAGNOSIS — L52 Erythema nodosum: Secondary | ICD-10-CM | POA: Diagnosis not present

## 2022-12-17 DIAGNOSIS — R768 Other specified abnormal immunological findings in serum: Secondary | ICD-10-CM | POA: Diagnosis not present

## 2022-12-17 DIAGNOSIS — M1991 Primary osteoarthritis, unspecified site: Secondary | ICD-10-CM | POA: Diagnosis not present

## 2022-12-17 DIAGNOSIS — M797 Fibromyalgia: Secondary | ICD-10-CM | POA: Diagnosis not present

## 2022-12-21 ENCOUNTER — Encounter: Payer: Self-pay | Admitting: Family Medicine

## 2022-12-23 ENCOUNTER — Encounter: Payer: Self-pay | Admitting: Family Medicine

## 2022-12-24 ENCOUNTER — Ambulatory Visit (HOSPITAL_BASED_OUTPATIENT_CLINIC_OR_DEPARTMENT_OTHER)
Admission: RE | Admit: 2022-12-24 | Discharge: 2022-12-24 | Disposition: A | Discharge status: Home / Self Care | Payer: Medicare Other | Source: Ambulatory Visit | Attending: Family Medicine | Admitting: Family Medicine

## 2022-12-24 ENCOUNTER — Ambulatory Visit (INDEPENDENT_AMBULATORY_CARE_PROVIDER_SITE_OTHER): Payer: Medicare Other | Admitting: Family Medicine

## 2022-12-24 ENCOUNTER — Other Ambulatory Visit: Payer: Self-pay | Admitting: Family Medicine

## 2022-12-24 ENCOUNTER — Encounter: Payer: Self-pay | Admitting: Family Medicine

## 2022-12-24 VITALS — BP 138/70 | HR 57 | Temp 98.6°F | Resp 18 | Ht 60.0 in | Wt 145.6 lb

## 2022-12-24 DIAGNOSIS — M79652 Pain in left thigh: Secondary | ICD-10-CM

## 2022-12-24 DIAGNOSIS — E559 Vitamin D deficiency, unspecified: Secondary | ICD-10-CM | POA: Diagnosis not present

## 2022-12-24 DIAGNOSIS — R791 Abnormal coagulation profile: Secondary | ICD-10-CM | POA: Diagnosis not present

## 2022-12-24 DIAGNOSIS — E785 Hyperlipidemia, unspecified: Secondary | ICD-10-CM | POA: Diagnosis not present

## 2022-12-24 DIAGNOSIS — I10 Essential (primary) hypertension: Secondary | ICD-10-CM | POA: Diagnosis not present

## 2022-12-24 DIAGNOSIS — R2242 Localized swelling, mass and lump, left lower limb: Secondary | ICD-10-CM | POA: Diagnosis not present

## 2022-12-24 LAB — COMPREHENSIVE METABOLIC PANEL
ALT: 15 U/L (ref 0–35)
AST: 21 U/L (ref 0–37)
Albumin: 4.4 g/dL (ref 3.5–5.2)
Alkaline Phosphatase: 86 U/L (ref 39–117)
BUN: 15 mg/dL (ref 6–23)
CO2: 28 mEq/L (ref 19–32)
Calcium: 9.5 mg/dL (ref 8.4–10.5)
Chloride: 103 mEq/L (ref 96–112)
Creatinine, Ser: 0.7 mg/dL (ref 0.40–1.20)
GFR: 83.49 mL/min (ref >60.00)
Glucose, Bld: 90 mg/dL (ref 70–99)
Potassium: 5.2 mEq/L — ABNORMAL HIGH (ref 3.5–5.1)
Sodium: 139 mEq/L (ref 135–145)
Total Bilirubin: 0.6 mg/dL (ref 0.2–1.2)
Total Protein: 6.7 g/dL (ref 6.0–8.3)

## 2022-12-24 LAB — CBC WITH DIFFERENTIAL/PLATELET
Basophils Absolute: 0 10*3/uL (ref 0.0–0.1)
Basophils Relative: 0.6 % (ref 0.0–3.0)
Eosinophils Absolute: 0 10*3/uL (ref 0.0–0.7)
Eosinophils Relative: 0.7 % (ref 0.0–5.0)
HCT: 39.8 % (ref 36.0–46.0)
Hemoglobin: 13.4 g/dL (ref 12.0–15.0)
Lymphocytes Relative: 16.8 % (ref 12.0–46.0)
Lymphs Abs: 0.9 10*3/uL (ref 0.7–4.0)
MCHC: 33.8 g/dL (ref 30.0–36.0)
MCV: 91.8 fl (ref 78.0–100.0)
Monocytes Absolute: 0.5 10*3/uL (ref 0.1–1.0)
Monocytes Relative: 8.8 % (ref 3.0–12.0)
Neutro Abs: 4 10*3/uL (ref 1.4–7.7)
Neutrophils Relative %: 73.1 % (ref 43.0–77.0)
Platelets: 140 10*3/uL — ABNORMAL LOW (ref 150.0–400.0)
RBC: 4.33 Mil/uL (ref 3.87–5.11)
RDW: 14.2 % (ref 11.5–15.5)
WBC: 5.4 10*3/uL (ref 4.0–10.5)

## 2022-12-24 LAB — LIPID PANEL
Cholesterol: 196 mg/dL (ref 0–200)
HDL: 70 mg/dL (ref >39.00)
LDL Cholesterol: 107 mg/dL — ABNORMAL HIGH (ref 0–99)
NonHDL: 126.47
Total CHOL/HDL Ratio: 3
Triglycerides: 96 mg/dL (ref 0.0–149.0)
VLDL: 19.2 mg/dL (ref 0.0–40.0)

## 2022-12-24 LAB — VITAMIN D 25 HYDROXY (VIT D DEFICIENCY, FRACTURES): VITD: 41.73 ng/mL (ref 30.00–100.00)

## 2022-12-24 NOTE — Progress Notes (Addendum)
Subjective:   By signing my name below, I, Carlena Bjornstad, attest that this documentation has been prepared under the direction and in the presence of Seabron Spates R, DO. 12/24/2022.   Patient ID: Kayla [name], female    DOB: 15-Jan-1946, 77 y.o.   MRN: [id number]  Chief Complaint  Patient presents with   Hypertension   Follow-up    HPI Patient is in today for an office visit.  Left thigh soreness:  She complains of a muscle aching and soreness (not a sharp pain) of her left dorsal thigh for about 1.5 weeks. She thought this may be related to her routine gym exercises, although she is usually careful with her weight lifting. Last night she performed her stretching exercises and was again aware of a palpable muscle knot in her left thigh, reportedly about the size of a quarter. This was how she had initially discovered the muscle knot. She denies noticing any swelling or erythema. While standing at the stairs, she had stretched her left foot out to the second stair. She demonstrates this in the office with a chair for her exam.  Diet:  She endorses a healthy diet. She takes multiple supplements including flaxseed oil.   Past Medical History:  Diagnosis Date   Abscess of left groin 11/10/2014   Allergy    Asthma    Cellulitis 10/20/2014   Diverticulitis    Diverticulosis    Fasting hyperglycemia 04/2011   FBS 108   Fibromyalgia    Fundic gland polyps of stomach, benign    GERD (gastroesophageal reflux disease)    gastric polyp x3   Glaucoma     Dr Hazle Quant   Hypertension    IBS (irritable bowel syndrome)    Osteopenia    last 07/2011   Pneumonia     OP as child   Shingles     Past Surgical History:  Procedure Laterality Date   COLONOSCOPY W/ POLYPECTOMY  1993,2002,2007   last  colonoscopy 2007, Dr Leone Payor   DILATION AND CURETTAGE OF UTERUS     ESOPHAGOGASTRODUODENOSCOPY     SPINE SURGERY     T9-L5 fusions    Family History  Problem Relation Age of Onset    Breast cancer Mother    Heart failure Father        CHF, CABG late 42s   Breast cancer Sister    Breast cancer Maternal Aunt        two   Heart disease Maternal Aunt    Breast cancer Paternal Aunt    Asthma Paternal Aunt    Coronary artery disease Paternal Aunt        triple CABG   Stomach cancer Paternal Uncle    Heart attack Paternal Uncle         MI in 90s   Kidney disease Maternal Grandmother    Heart attack Paternal Grandmother        MI in late 72s   Heart disease Paternal Grandfather    Pulmonary embolism Son    Deep vein thrombosis Son    Heart Problems Son        Heart attacks related to a "blood disorder"    Ovarian cancer Other        Niece x2   Breast cancer Other        niece   Diabetes Neg Hx    Stroke Neg Hx    COPD Neg Hx    Colon polyps Neg Hx  Colon cancer Neg Hx    Esophageal cancer Neg Hx    Rectal cancer Neg Hx     Social History   Socioeconomic History   Marital status: Widowed    Spouse name: Not on file   Number of children: Not on file   Years of education: Not on file   Highest education level: Some college, no degree  Occupational History   Occupation: retired  Tobacco Use   Smoking status: Former    Packs/day: 0.50    Years: 10.00    Additional pack years: 0.00    Total pack years: 5.00    Types: Cigarettes    Quit date: 09/10/1974    Years since quitting: 48.3   Smokeless tobacco: Never   Tobacco comments:    smoked 1966- 1976, up to 1 ppd  Vaping Use   Vaping Use: Never used  Substance and Sexual Activity   Alcohol use: No    Alcohol/week: 0.0 standard drinks of alcohol   Drug use: No   Sexual activity: Not Currently  Other Topics Concern   Not on file  Social History Narrative   Daily caffeine 3 cups   Regular exercise   Married         Social Determinants of Health   Financial Resource Strain: Low Risk  (12/21/2022)   Overall Financial Resource Strain (CARDIA)    Difficulty of Paying Living Expenses: Not  very hard  Food Insecurity: No Food Insecurity (12/21/2022)   Hunger Vital Sign    Worried About Running Out of Food in the Last Year: Never true    Ran Out of Food in the Last Year: Never true  Transportation Needs: No Transportation Needs (12/21/2022)   PRAPARE - Administrator, Civil Service (Medical): No    Lack of Transportation (Non-Medical): No  Physical Activity: Insufficiently Active (12/21/2022)   Exercise Vital Sign    Days of Exercise per Week: 3 days    Minutes of Exercise per Session: 40 min  Stress: No Stress Concern Present (12/21/2022)   [employer] of Occupational Health - Occupational Stress Questionnaire    Feeling of Stress : Not at all  Social Connections: Moderately Integrated (12/21/2022)   Social Connection and Isolation Panel [NHANES]    Frequency of Communication with Friends and Family: More than three times a week    Frequency of Social Gatherings with Friends and Family: Once a week    Attends Religious Services: More than 4 times per year    Active Member of [employer] or Organizations: Yes    Attends Banker Meetings: More than 4 times per year    Marital Status: Widowed  Intimate Partner Violence: Not At Risk (02/06/2022)   Humiliation, Afraid, Rape, and Kick questionnaire    Fear of Current or Ex-Partner: No    Emotionally Abused: No    Physically Abused: No    Sexually Abused: No    Outpatient Medications Prior to Visit  Medication Sig Dispense Refill   acetaminophen (TYLENOL) 500 MG tablet Take 500 mg by mouth as needed.     albuterol (PROVENTIL) (2.5 MG/3ML) 0.083% nebulizer solution Take 3 mLs (2.5 mg total) by nebulization every 6 (six) hours as needed for wheezing or shortness of breath. 75 mL 12   albuterol (VENTOLIN HFA) 108 (90 Base) MCG/ACT inhaler Inhale 2 puffs into the lungs every 6 (six) hours as needed for wheezing or shortness of breath. 18 g 5   amLODipine (NORVASC) 2.5  MG tablet Take 1 tablet (2.5 mg total) by  mouth daily. 90 tablet 1   bifidobacterium infantis (ALIGN) capsule Take 1 capsule by mouth daily. 21 capsule 0   carboxymethylcellulose (REFRESH PLUS) 0.5 % SOLN 1 drop 3 (three) times daily as needed.     cholecalciferol (VITAMIN D) 1000 units tablet Take 2,000 Units by mouth daily.     EPINEPHrine (EPIPEN 2-PAK) 0.3 mg/0.3 mL IJ SOAJ injection As directed 1 each 0   Esomeprazole Magnesium (NEXIUM 24HR PO) Take 1 capsule by mouth as needed.     famotidine (PEPCID) 20 MG tablet Take 20 mg by mouth daily.     Flaxseed, Linseed, (GROUND FLAX SEEDS PO)      hyoscyamine (LEVSIN SL) 0.125 MG SL tablet Place 1 tablet (0.125 mg total) under the tongue every 6 (six) hours as needed. 10 tablet 1   latanoprost (XALATAN) 0.005 % ophthalmic solution Place 1 drop into both eyes at bedtime.     metoprolol tartrate (LOPRESSOR) 25 MG tablet 1 po bid 180 tablet 3   montelukast (SINGULAIR) 10 MG tablet TAKE 1 TABLET(10 MG) BY MOUTH AT BEDTIME 90 tablet 1   nystatin cream (MYCOSTATIN) APPLY TOPICALLY TO THE AFFECTED AREA TWICE DAILY 30 g 1   Polyethyl Glycol-Propyl Glycol (SYSTANE) 0.4-0.3 % GEL ophthalmic gel Place 1 application into both eyes.     sodium chloride (OCEAN) 0.65 % SOLN nasal spray Place 1 spray into both nostrils as needed for congestion.     triamcinolone (KENALOG) 0.1 % Apply 1 application topically 2 (two) times daily. 90 g 3   Vitamin Mixture (ESTER-C PO) Take 1,000 mg by mouth daily.     Calcium-Magnesium-Vitamin D (CALCIUM MAGNESIUM PO) Take 2 tablets by mouth daily.     fluticasone (FLONASE) 50 MCG/ACT nasal spray USE 2 SPRAYS IN EACH  NOSTRIL ONCE A DAY 48 g 3   No facility-administered medications prior to visit.    Allergies  Allergen Reactions   Cephalosporins     Blisters  orally   Gabapentin     REACTION: rash   Levofloxacin     REACTION: stomach ache and rash   Psyllium     REACTION: rash   Sulfonamide Derivatives Anaphylaxis    REACTION: shock, urticaria   Aspirin      REACTION: stomach pain   Belladonna    Ciprofloxacin Hcl    Conjugated Estrogens    Cymbopogon    Doxycycline     Abdominal pain   Flovent Hfa [Fluticasone]    Nabumetone    Nsaids    Soybean-Containing Drug Products Other (See Comments)    GI upset   Zicam Cold Remedy [Homeopathic Products]    Zocor [Simvastatin] Itching   Nickel Itching and Rash   Paba Derivatives Itching and Rash   Prednisone Rash    NO PROBLEM WITH MEDROL DOSE PAK Oral prednisone caused facial burning, "made my face feel like it was on fire"    Review of Systems  Constitutional:  Negative for fever and malaise/fatigue.  HENT:  Negative for congestion.   Eyes:  Negative for blurred vision.  Respiratory:  Negative for shortness of breath.   Cardiovascular:  Negative for chest pain, palpitations and leg swelling.  Gastrointestinal:  Negative for abdominal pain, blood in stool and nausea.  Genitourinary:  Negative for dysuria and frequency.  Musculoskeletal:  Positive for myalgias (Left dorsal thigh). Negative for falls.  Skin:  Negative for rash.  Neurological:  Negative for dizziness, loss  of consciousness and headaches.  Endo/Heme/Allergies:  Negative for environmental allergies.  Psychiatric/Behavioral:  Negative for depression. The patient is not nervous/anxious.    See HPI.     Objective:    Physical Exam Vitals and nursing note reviewed.  Constitutional:      Appearance: Normal appearance. She is well-developed.  HENT:     Head: Normocephalic and atraumatic.     Right Ear: Tympanic membrane, ear canal and external ear normal.     Left Ear: Tympanic membrane, ear canal and external ear normal.  Eyes:     Extraocular Movements: Extraocular movements intact.     Conjunctiva/sclera: Conjunctivae normal.     Pupils: Pupils are equal, round, and reactive to light.  Neck:     Thyroid: No thyromegaly.     Vascular: No carotid bruit or JVD.  Cardiovascular:     Rate and Rhythm: Normal rate and  regular rhythm.     Heart sounds: Normal heart sounds. No murmur heard.    No gallop.  Pulmonary:     Effort: Pulmonary effort is normal. No respiratory distress.     Breath sounds: Normal breath sounds. No wheezing or rales.  Chest:     Chest wall: No tenderness.  Musculoskeletal:     Cervical back: Normal range of motion and neck supple.     Left upper leg: No swelling or tenderness.     Comments: Lipoma of left dorsal thigh.  Skin:    General: Skin is warm and dry.  Neurological:     General: No focal deficit present.     Mental Status: She is alert and oriented to person, place, and time.  Psychiatric:        Mood and Affect: Mood normal.        Behavior: Behavior normal.     BP 138/70 (BP Location: Left Arm, Patient Position: Sitting, Cuff Size: Normal)   Pulse (!) 57   Temp 98.6 F (37 C) (Oral)   Resp 18   Ht 5' (1.524 m)   Wt 145 lb 9.6 oz (66 kg)   SpO2 96%   BMI 28.44 kg/m  Wt Readings from Last 3 Encounters:  12/24/22 145 lb 9.6 oz (66 kg)  06/13/22 145 lb 4 oz (65.9 kg)  06/12/22 145 lb 12.8 oz (66.1 kg)    Diabetic Foot Exam - Simple   No data filed    Lab Results  Component Value Date   WBC 5.9 06/12/2022   HGB 13.3 06/12/2022   HCT 38.9 06/12/2022   PLT 135.0 (L) 06/12/2022   GLUCOSE 87 06/12/2022   CHOL 245 (H) 01/26/2022   TRIG 115.0 01/26/2022   HDL 63.60 01/26/2022   LDLDIRECT 153.3 12/18/2012   LDLCALC 159 (H) 01/26/2022   ALT 14 06/12/2022   AST 19 06/12/2022   NA 138 06/12/2022   K 4.1 06/12/2022   CL 101 06/12/2022   CREATININE 0.71 06/12/2022   BUN 18 06/12/2022   CO2 28 06/12/2022   TSH 3.19 06/12/2022   INR 1.2 06/09/2022   HGBA1C 5.4 11/01/2011    Lab Results  Component Value Date   TSH 3.19 06/12/2022   Lab Results  Component Value Date   WBC 5.9 06/12/2022   HGB 13.3 06/12/2022   HCT 38.9 06/12/2022   MCV 92.8 06/12/2022   PLT 135.0 (L) 06/12/2022   Lab Results  Component Value Date   NA 138 06/12/2022   K  4.1 06/12/2022   CO2 28 06/12/2022  GLUCOSE 87 06/12/2022   BUN 18 06/12/2022   CREATININE 0.71 06/12/2022   BILITOT 0.5 06/12/2022   ALKPHOS 95 06/12/2022   AST 19 06/12/2022   ALT 14 06/12/2022   PROT 6.8 06/12/2022   ALBUMIN 4.4 06/12/2022   CALCIUM 9.6 06/12/2022   ANIONGAP 9 06/09/2022   GFR 82.39 06/12/2022   Lab Results  Component Value Date   CHOL 245 (H) 01/26/2022   Lab Results  Component Value Date   HDL 63.60 01/26/2022   Lab Results  Component Value Date   LDLCALC 159 (H) 01/26/2022   Lab Results  Component Value Date   TRIG 115.0 01/26/2022   Lab Results  Component Value Date   CHOLHDL 4 01/26/2022   Lab Results  Component Value Date   HGBA1C 5.4 11/01/2011       Assessment & Plan:   Problem List Items Addressed This Visit       Unprioritized   Vitamin D deficiency   Relevant Orders   VITAMIN D 25 Hydroxy (Vit-D Deficiency, Fractures)   Hyperlipidemia    Encourage heart healthy diet such as MIND or DASH diet, increase exercise, avoid trans fats, simple carbohydrates and processed foods, consider a krill or fish or flaxseed oil cap daily.        Relevant Orders   CBC with Differential/Platelet   Comprehensive metabolic panel   Lipid panel   Lipoprotein A (LPA)   Essential hypertension - Primary    Well controlled, no changes to meds. Encouraged heart healthy diet such as the DASH diet and exercise as tolerated.        Relevant Orders   CBC with Differential/Platelet   Comprehensive metabolic panel   Lipid panel   Other Visit Diagnoses     Pain in left thigh       Relevant Orders   US Venous Img Lower Unilateral Left (DVT)   Elevated partial thromboplastin time (PTT)       Relevant Orders   APTT        No orders of the defined types were placed in this encounter.   IDonato Schultz, DO, personally preformed the services described in this documentation.  All medical record entries made by the scribe were at my  direction and in my presence.  I have reviewed the chart and discharge instructions (if applicable) and agree that the record reflects my personal performance and is accurate and complete. 12/24/2022.  I,Mathew Stumpf,acting as a Neurosurgeon for [employer], DO.,have documented all relevant documentation on the behalf of Donato Schultz, DO,as directed by  Donato Schultz, DO while in the presence of Donato Schultz, DO.   Donato Schultz, DO

## 2022-12-24 NOTE — Patient Instructions (Signed)
Lipoma  A lipoma is a noncancerous (benign) tumor that is made up of fat cells. This is a very common type of soft-tissue growth. Lipomas are usually found under the skin (subcutaneous). They may occur in any tissue of the body that contains fat. Common areas for lipomas to appear include the back, arms, shoulders, buttocks, and thighs. Lipomas grow slowly, and they are usually painless. Most lipomas do not cause problems and do not require treatment. What are the causes? The cause of this condition is not known. What increases the risk? You are more likely to develop this condition if: You are 40-60 years old. You have a family history of lipomas. What are the signs or symptoms? A lipoma usually appears as a small, round bump under the skin. In most cases, the lump will: Feel soft or rubbery. Not cause pain or other symptoms. However, if a lipoma is located in an area where it pushes on nerves, it can become painful or cause other symptoms. How is this diagnosed? A lipoma can usually be diagnosed with a physical exam. You may also have tests to confirm the diagnosis and to rule out other conditions. Tests may include: Imaging tests, such as a CT scan or an MRI. Removal of a tissue sample to be looked at under a microscope (biopsy). How is this treated? Treatment for this condition depends on the size of the lipoma and whether it is causing any symptoms. For small lipomas that are not causing problems, no treatment is needed. If a lipoma is bigger or it causes problems, surgery may be done to remove the lipoma. Lipomas can also be removed to improve appearance. Most often, the procedure is done after applying a medicine that numbs the area (local anesthetic). Liposuction may be done to reduce the size of the lipoma before it is removed through surgery, or it may be done to remove the lipoma. Lipomas are removed with this method to limit incision size and scarring. A liposuction tube is  inserted through a small incision into the lipoma, and the contents of the lipoma are removed through the tube with suction. Follow these instructions at home: Watch your lipoma for any changes. Keep all follow-up visits. This is important. Where to find more information OrthoInfo: orthoinfo.aaos.org Contact a health care provider if: Your lipoma becomes larger or hard. Your lipoma becomes painful, red, or increasingly swollen. These could be signs of infection or a more serious condition. Get help right away if: You develop tingling or numbness in an area near the lipoma. This could indicate that the lipoma is causing nerve damage. Summary A lipoma is a noncancerous tumor that is made up of fat cells. Most lipomas do not cause problems and do not require treatment. If a lipoma is bigger or it causes problems, surgery may be done to remove the lipoma. Contact a health care provider if your lipoma becomes larger or hard, or if it becomes painful, red, or increasingly swollen. These could be signs of infection or a more serious condition. This information is not intended to replace advice given to you by your health care provider. Make sure you discuss any questions you have with your health care provider. Document Revised: 09/15/2021 Document Reviewed: 09/15/2021 Elsevier Patient Education  2023 [employer] Inc.  

## 2022-12-24 NOTE — Assessment & Plan Note (Signed)
Well controlled, no changes to meds. Encouraged heart healthy diet such as the DASH diet and exercise as tolerated.  °

## 2022-12-24 NOTE — Assessment & Plan Note (Signed)
Encourage heart healthy diet such as MIND or DASH diet, increase exercise, avoid trans fats, simple carbohydrates and processed foods, consider a krill or fish or flaxseed oil cap daily.  °

## 2022-12-28 ENCOUNTER — Encounter: Payer: Self-pay | Admitting: Family Medicine

## 2022-12-28 LAB — LIPOPROTEIN A (LPA): Lipoprotein (a): <10 nmol/L (ref <75)

## 2022-12-31 ENCOUNTER — Encounter: Payer: Self-pay | Admitting: Family Medicine

## 2022-12-31 NOTE — Therapy (Unsigned)
OUTPATIENT PHYSICAL THERAPY THORACOLUMBAR EVALUATION   Patient Name: Kayla [name] MRN: [id number] DOB:Jan 31, 1946, 77 y.o., female Today's Date: 01/01/2023  END OF SESSION:  PT End of Session - 01/01/23 1059     Visit Number 1    Date for PT Re-Evaluation 02/26/23    PT Start Time 1100    PT Stop Time 1135    PT Time Calculation (min) 35 min    Activity Tolerance Patient tolerated treatment well    Behavior During Therapy [facility] for tasks assessed/performed             Past Medical History:  Diagnosis Date   Abscess of left groin 11/10/2014   Allergy    Asthma    Cellulitis 10/20/2014   Diverticulitis    Diverticulosis    Fasting hyperglycemia 04/2011   FBS 108   Fibromyalgia    Fundic gland polyps of stomach, benign    GERD (gastroesophageal reflux disease)    gastric polyp x3   Glaucoma     Dr Hazle Quant   Hypertension    IBS (irritable bowel syndrome)    Osteopenia    last 07/2011   Pneumonia     OP as child   Shingles    Past Surgical History:  Procedure Laterality Date   COLONOSCOPY W/ POLYPECTOMY  1993,2002,2007   last  colonoscopy 2007, Dr Leone Payor   DILATION AND CURETTAGE OF UTERUS     ESOPHAGOGASTRODUODENOSCOPY     SPINE SURGERY     T9-L5 fusions   Patient Active Problem List   Diagnosis Date Noted   Bilateral low back pain with sciatica 06/12/2022   Weakness 06/12/2022   Pulmonary nodule less than 1 cm in diameter with low risk for malignant neoplasm 03/14/2021   Asthmatic bronchitis 03/09/2020   Allergies 01/18/2020   Preventative health care 03/29/2019   Pneumonia of left lower lobe due to infectious organism 09/01/2018   Vaginal atrophy 03/24/2018   Seasonal allergies 02/08/2018   Erythema nodosum 06/09/2016   Herpes zoster 09/23/2013   Superficial bruising of finger 02/16/2013   Hyperlipidemia 11/01/2011   Palpitations 05/23/2011   Essential hypertension 04/23/2011   Cough 04/09/2011   THYROID FUNCTION TEST, ABNORMAL 12/20/2009    COLONIC POLYPS, HX OF 11/08/2008   Asthma 01/21/2008   GERD 01/21/2008   IRRITABLE BOWEL SYNDROME 01/21/2008   FIBROMYALGIA 01/21/2008   Vitamin D deficiency 04/02/2007   DEGENERATIVE DISC DISEASE 04/02/2007   GLAUCOMA, LOW TENSION 02/06/2007   Osteoporosis 02/06/2007    PCP: Donato Schultz, DO  REFERRING PROVIDER: Barnett Abu, MD  REFERRING DIAG:  Diagnosis  Z87.39 (ICD-10-CM) - Personal history of other diseases of the musculoskeletal system and connective tissue  Postural exercise program for scoliosis emphasizing neutral and extension exercises.  Rationale for Evaluation and Treatment: Rehabilitation  THERAPY DIAG:  Abnormal posture  Difficulty in walking, not elsewhere classified  Muscle weakness (generalized)  Chronic hip pain, bilateral  ONSET DATE: 12/20/22  SUBJECTIVE:  SUBJECTIVE STATEMENT: Patient reports that around 5 months ago she had an episode where her legs suddenly felt numb. She went to ER and had multiple tests, which were all negative. Her posterior pelvis was TTP. Neurologist cleared her. Neuro surgeon saw her and feels the pain is from arthritis. She had fusion of T9-L5 many years ago. She also has R trigger finger. She exercises mult days per week, walking 20-30 minutes on the track and lifting weights. B fluctuating knee pain.  PERTINENT HISTORY:  Per referring provider S/P T9-L5 fusion years ago  PAIN:  Are you having pain? Yes: NPRS scale: 2/10 Pain location: low back B Pain description: soreness Aggravating factors: Unknown, fluctuates Relieving factors: Tylenol.  PRECAUTIONS: None  WEIGHT BEARING RESTRICTIONS: No  FALLS:  Has patient fallen in last 6 months? No  LIVING ENVIRONMENT: Lives with: lives with their family and lives alone Lives  in: House/apartment Stairs: Yes: Internal: 14 steps; can reach both and External: 3 steps; can reach both Has following equipment at home: None  OCCUPATION: N/A-exercises regularly.  PLOF: Independent  PATIENT GOALS: Patient would like to learn some exercises to decrease the pain in her back. Improve posture.  NEXT MD VISIT: None scheduled with Dr Danielle Dess.  OBJECTIVE:   DIAGNOSTIC FINDINGS:  Per referring provider-X rays in Oct show pedicle screws and hardware in her spine with U rod connection from L5-T9. Apparent old fracture of the L sided rod that appears old and stable. Adjacent T spine appear stable.  SCREENING FOR RED FLAGS: Bowel or bladder incontinence: No Spinal tumors: No Cauda equina syndrome: No Compression fracture: No Abdominal aneurysm: No  COGNITION: Overall cognitive status: Within functional limits for tasks assessed     SENSATION: WFL  MUSCLE LENGTH: Hamstrings: B WNL, nearly 90 Thomas test: B hip flexor tightness noted, moderate  POSTURE: rounded shoulders, forward head, decreased lumbar lordosis, decreased thoracic kyphosis, anterior pelvic tilt, and rib hump on L thoracic spine  PALPATION: TTP B along gluts origin and piriformis, R > L.  LUMBAR ROM:   AROM eval  Flexion To feet  Extension 60%  Right lateral flexion To knee  Left lateral flexion Mid thigh  Right rotation WNL  Left rotation WNL   (Blank rows = not tested)  LOWER EXTREMITY ROM:   WFL except hip ext limited   LOWER EXTREMITY MMT:  B hips 4-(4-)/5, kneesand ankles 5/5  FUNCTIONAL TESTS:  5 times sit to stand: 14.8 Functional gait assessment: TBD  GAIT: Distance walked: In clinic distances Assistive device utilized: None Level of assistance: Complete Independence Comments: slightly flexed posture noted with excessive trunk sway.  TODAY'S TREATMENT:                                                                                                                               DATE:  01/01/23  Education Supine-Figure 4 stretch, glut stretch, thomas stretch  PATIENT EDUCATION:  Education details: POC Person educated:  Patient Education method: Explanation Education comprehension: verbalized understanding  HOME EXERCISE PROGRAM: [id number] identified, but did not review with patient or provide written copy. Initiated figure 4 and gluts stretches, thomas stretch with demo and return demo.  ASSESSMENT:  CLINICAL IMPRESSION: Patient is a 77 y.o. who was seen today for physical therapy evaluation and treatment for LBP/hip pain B. She has a H/O fusion from T9-L5, chronic B knee pain-which fluctuates and is not too bad at this time. She also demonstrates weakness in hips and trunk, and very tight hip flexors B. She reports that Mckenzie exercises were beneficial to her after surgery and the Dr encouraged her to perform prone lying, which she can tolerate as long as she does not try to push up on elbows. Patient would also like to address her posture, which would benefit from the trunk strengthening. [facility], LE strengthening, and cardio with walking. She will benefit from PT to address her muscle spasms in her gluts and piriformis as well as for stretching and strengthening of hips and trunk to decrease back strain, increase trunk stability and postural control.  OBJECTIVE IMPAIRMENTS: Abnormal gait, decreased activity tolerance, decreased balance, decreased coordination, decreased endurance, difficulty walking, decreased ROM, decreased strength, increased muscle spasms, impaired flexibility, improper body mechanics, postural dysfunction, and pain.   ACTIVITY LIMITATIONS: carrying, lifting, bending, sitting, squatting, and locomotion level  PARTICIPATION LIMITATIONS: meal prep, cleaning, shopping, and community activity  PERSONAL FACTORS: Age, Past/current experiences, and 1 comorbidity: Multi-level  fusion years ago from T9-L5  are also affecting patient's functional outcome.   REHAB POTENTIAL: Good  CLINICAL DECISION MAKING: Stable/uncomplicated  EVALUATION COMPLEXITY: Low   GOALS: Goals reviewed with patient? Yes  SHORT TERM GOALS: Target date: 01/13/23  I with initial HEP Baseline: Goal status: INITIAL  LONG TERM GOALS: Target date: 02/26/23  I with final HEP Baseline:  Goal status: INITIAL  2.  Increase B hip strength to 4+/5 Baseline: 4-(4-)/5 Goal status: INITIAL  3.  Patient will tolerate palpation of B gluts and piriformis without reports of pain, will also report no hip pain when performing her normal daily activities. Baseline: Fluctuating pain with activity as well as mod TTP. Goal status: INITIAL  4.  Patient will demonstrate improved hip flexor mobility by being able to perform full [facility] test with the test limb lying flat on mat with knee flexion through 1/2 ROM. Baseline: Unable to achieve true [facility] test position due to pain and tightness. Goal status: INITIAL  5.  Patient will ambulate at least 300' on level and unlevel surfaces with no report of pain or unsteadiness, demonstrating upright posture throughout. Baseline: flexed posture, limited distances. Goal status: INITIAL  PLAN:  PT FREQUENCY: 1-2x/week  PT DURATION: 8 weeks  PLANNED INTERVENTIONS: Therapeutic exercises, Therapeutic activity, Neuromuscular re-education, Balance training, Gait training, Patient/Family education, Self Care, Joint mobilization, Stair training, Dry Needling, Electrical stimulation, Cryotherapy, Moist heat, Taping, Ionotophoresis /ml Dexamethasone, and Manual therapy.  PLAN FOR NEXT SESSION: Review HEP, provide her written copy. STM to B gluts and piriformis with stretch and strengthening.   Iona Beard, DPT 01/01/2023, 12:13 PM

## 2023-01-01 ENCOUNTER — Encounter: Payer: Self-pay | Admitting: Physical Therapy

## 2023-01-01 ENCOUNTER — Other Ambulatory Visit: Payer: Self-pay | Admitting: Family Medicine

## 2023-01-01 ENCOUNTER — Ambulatory Visit: Payer: Medicare Other | Attending: Neurological Surgery | Admitting: Physical Therapy

## 2023-01-01 DIAGNOSIS — M25551 Pain in right hip: Secondary | ICD-10-CM | POA: Diagnosis not present

## 2023-01-01 DIAGNOSIS — M6281 Muscle weakness (generalized): Secondary | ICD-10-CM | POA: Insufficient documentation

## 2023-01-01 DIAGNOSIS — M25552 Pain in left hip: Secondary | ICD-10-CM | POA: Diagnosis not present

## 2023-01-01 DIAGNOSIS — G8929 Other chronic pain: Secondary | ICD-10-CM | POA: Diagnosis not present

## 2023-01-01 DIAGNOSIS — R293 Abnormal posture: Secondary | ICD-10-CM | POA: Insufficient documentation

## 2023-01-01 DIAGNOSIS — R262 Difficulty in walking, not elsewhere classified: Secondary | ICD-10-CM | POA: Insufficient documentation

## 2023-01-01 DIAGNOSIS — E875 Hyperkalemia: Secondary | ICD-10-CM

## 2023-01-02 ENCOUNTER — Other Ambulatory Visit: Payer: Self-pay | Admitting: Family Medicine

## 2023-01-02 DIAGNOSIS — R21 Rash and other nonspecific skin eruption: Secondary | ICD-10-CM

## 2023-01-02 NOTE — Addendum Note (Signed)
Addended by: Roxanne Gates on: 01/02/2023 08:58 AM   Modules accepted: Orders

## 2023-01-03 ENCOUNTER — Ambulatory Visit: Payer: Medicare Other | Admitting: Physical Therapy

## 2023-01-03 ENCOUNTER — Other Ambulatory Visit (INDEPENDENT_AMBULATORY_CARE_PROVIDER_SITE_OTHER): Payer: Medicare Other

## 2023-01-03 ENCOUNTER — Encounter: Payer: Self-pay | Admitting: Physical Therapy

## 2023-01-03 DIAGNOSIS — R293 Abnormal posture: Secondary | ICD-10-CM | POA: Diagnosis not present

## 2023-01-03 DIAGNOSIS — G8929 Other chronic pain: Secondary | ICD-10-CM

## 2023-01-03 DIAGNOSIS — R262 Difficulty in walking, not elsewhere classified: Secondary | ICD-10-CM | POA: Diagnosis not present

## 2023-01-03 DIAGNOSIS — M6281 Muscle weakness (generalized): Secondary | ICD-10-CM | POA: Diagnosis not present

## 2023-01-03 DIAGNOSIS — E875 Hyperkalemia: Secondary | ICD-10-CM

## 2023-01-03 DIAGNOSIS — R791 Abnormal coagulation profile: Secondary | ICD-10-CM

## 2023-01-03 DIAGNOSIS — E785 Hyperlipidemia, unspecified: Secondary | ICD-10-CM | POA: Diagnosis not present

## 2023-01-03 DIAGNOSIS — M25552 Pain in left hip: Secondary | ICD-10-CM | POA: Diagnosis not present

## 2023-01-03 DIAGNOSIS — M25551 Pain in right hip: Secondary | ICD-10-CM | POA: Diagnosis not present

## 2023-01-03 NOTE — Therapy (Signed)
OUTPATIENT PHYSICAL THERAPY THORACOLUMBAR TREATMENT   Patient Name: Kayla [name] MRN: [id number] DOB:May 15, 1946, 77 y.o., female Today's Date: 01/03/2023  END OF SESSION:  PT End of Session - 01/03/23 1428     Visit Number 2    Date for PT Re-Evaluation 02/26/23    PT Start Time 1430    PT Stop Time 1515    PT Time Calculation (min) 45 min    Activity Tolerance Patient tolerated treatment well    Behavior During Therapy [facility] for tasks assessed/performed             Past Medical History:  Diagnosis Date   Abscess of left groin 11/10/2014   Allergy    Asthma    Cellulitis 10/20/2014   Diverticulitis    Diverticulosis    Fasting hyperglycemia 04/2011   FBS 108   Fibromyalgia    Fundic gland polyps of stomach, benign    GERD (gastroesophageal reflux disease)    gastric polyp x3   Glaucoma     Dr Hazle Quant   Hypertension    IBS (irritable bowel syndrome)    Osteopenia    last 07/2011   Pneumonia     OP as child   Shingles    Past Surgical History:  Procedure Laterality Date   COLONOSCOPY W/ POLYPECTOMY  1993,2002,2007   last  colonoscopy 2007, Dr Leone Payor   DILATION AND CURETTAGE OF UTERUS     ESOPHAGOGASTRODUODENOSCOPY     SPINE SURGERY     T9-L5 fusions   Patient Active Problem List   Diagnosis Date Noted   Bilateral low back pain with sciatica 06/12/2022   Weakness 06/12/2022   Pulmonary nodule less than 1 cm in diameter with low risk for malignant neoplasm 03/14/2021   Asthmatic bronchitis 03/09/2020   Allergies 01/18/2020   Preventative health care 03/29/2019   Pneumonia of left lower lobe due to infectious organism 09/01/2018   Vaginal atrophy 03/24/2018   Seasonal allergies 02/08/2018   Erythema nodosum 06/09/2016   Herpes zoster 09/23/2013   Superficial bruising of finger 02/16/2013   Hyperlipidemia 11/01/2011   Palpitations 05/23/2011   Essential hypertension 04/23/2011   Cough 04/09/2011   THYROID FUNCTION TEST, ABNORMAL 12/20/2009    COLONIC POLYPS, HX OF 11/08/2008   Asthma 01/21/2008   GERD 01/21/2008   IRRITABLE BOWEL SYNDROME 01/21/2008   FIBROMYALGIA 01/21/2008   Vitamin D deficiency 04/02/2007   DEGENERATIVE DISC DISEASE 04/02/2007   GLAUCOMA, LOW TENSION 02/06/2007   Osteoporosis 02/06/2007    PCP: Donato Schultz, DO  REFERRING PROVIDER: Barnett Abu, MD  REFERRING DIAG:  Diagnosis  Z87.39 (ICD-10-CM) - Personal history of other diseases of the musculoskeletal system and connective tissue  Postural exercise program for scoliosis emphasizing neutral and extension exercises.  Rationale for Evaluation and Treatment: Rehabilitation  THERAPY DIAG:  Abnormal posture  Difficulty in walking, not elsewhere classified  Chronic hip pain, bilateral  ONSET DATE: 12/20/22  SUBJECTIVE:  SUBJECTIVE STATEMENT: Pt reports reading the POC and reporting she is allergic to Dexamethasone. Feeling fine, back hurts when she presses on he it or turns the wong way  PERTINENT HISTORY:  Per referring provider S/P T9-L5 fusion years ago  PAIN:  Are you having pain? Yes: NPRS scale: 0/10 Pain location: low back B Pain description: soreness Aggravating factors: Unknown, fluctuates Relieving factors: Tylenol.  PRECAUTIONS: None  WEIGHT BEARING RESTRICTIONS: No  FALLS:  Has patient fallen in last 6 months? No  LIVING ENVIRONMENT: Lives with: lives with their family and lives alone Lives in: House/apartment Stairs: Yes: Internal: 14 steps; can reach both and External: 3 steps; can reach both Has following equipment at home: None  OCCUPATION: N/A-exercises regularly.  PLOF: Independent  PATIENT GOALS: Patient would like to learn some exercises to decrease the pain in her back. Improve posture.  NEXT MD VISIT: None  scheduled with Dr Danielle Dess.  OBJECTIVE:   DIAGNOSTIC FINDINGS:  Per referring provider-X rays in Oct show pedicle screws and hardware in her spine with U rod connection from L5-T9. Apparent old fracture of the L sided rod that appears old and stable. Adjacent T spine appear stable.  SCREENING FOR RED FLAGS: Bowel or bladder incontinence: No Spinal tumors: No Cauda equina syndrome: No Compression fracture: No Abdominal aneurysm: No  COGNITION: Overall cognitive status: Within functional limits for tasks assessed     SENSATION: WFL  MUSCLE LENGTH: Hamstrings: B WNL, nearly 90 Thomas test: B hip flexor tightness noted, moderate  POSTURE: rounded shoulders, forward head, decreased lumbar lordosis, decreased thoracic kyphosis, anterior pelvic tilt, and rib hump on L thoracic spine  PALPATION: TTP B along gluts origin and piriformis, R > L.  LUMBAR ROM:   AROM eval  Flexion To feet  Extension 60%  Right lateral flexion To knee  Left lateral flexion Mid thigh  Right rotation WNL  Left rotation WNL   (Blank rows = not tested)  LOWER EXTREMITY ROM:   WFL except hip ext limited   LOWER EXTREMITY MMT:  B hips 4-(4-)/5, kneesand ankles 5/5  FUNCTIONAL TESTS:  5 times sit to stand: 14.8 Functional gait assessment: TBD  GAIT: Distance walked: In clinic distances Assistive device utilized: None Level of assistance: Complete Independence Comments: slightly flexed posture noted with excessive trunk sway.  TODAY'S TREATMENT:                                                                                                                              DATE:  01/02/24 NuStep L4 x 7 min Shoulder Ext 5lb 2x10 Seated Rows & Lats 15lb 2x10 Bridges LE on Pball bridges, oblg, K2C Passive HS, piriformis, K2C, lower trunk rotations. 01/01/23  Education Supine-Figure 4 stretch, glut stretch, thomas stretch  PATIENT EDUCATION:  Education details: POC Person educated:  Patient Education method: Explanation Education comprehension: verbalized understanding  HOME EXERCISE PROGRAM: [id number]-Program identified, but did not review with patient or provide written copy.  Initiated figure 4 and gluts stretches, thomas stretch with demo and return demo.  ASSESSMENT:  CLINICAL IMPRESSION: Patient is a 77 y.o. who was seen today for physical therapy treatment for LBP/hip pain B. Session consisted of posterior chain strengthening. Cues for core engagement needed with standing shoulder Ext. Pt able to maintain good posture with seated rows. Some piriformis tightness with passive stretching.  She will benefit from PT to address her muscle spasms in her gluts and piriformis as well as for stretching and strengthening of hips and trunk to decrease back strain, increase trunk stability and postural control.  OBJECTIVE IMPAIRMENTS: Abnormal gait, decreased activity tolerance, decreased balance, decreased coordination, decreased endurance, difficulty walking, decreased ROM, decreased strength, increased muscle spasms, impaired flexibility, improper body mechanics, postural dysfunction, and pain.   ACTIVITY LIMITATIONS: carrying, lifting, bending, sitting, squatting, and locomotion level  PARTICIPATION LIMITATIONS: meal prep, cleaning, shopping, and community activity  PERSONAL FACTORS: Age, Past/current experiences, and 1 comorbidity: Multi-level fusion years ago from T9-L5  are also affecting patient's functional outcome.   REHAB POTENTIAL: Good  CLINICAL DECISION MAKING: Stable/uncomplicated  EVALUATION COMPLEXITY: Low   GOALS: Goals reviewed with patient? Yes  SHORT TERM GOALS: Target date: 01/13/23  I with initial HEP Baseline: Goal status: progressing  LONG TERM GOALS: Target date: 02/26/23  I with final HEP Baseline:  Goal status: INITIAL  2.  Increase B hip strength to 4+/5 Baseline: 4-(4-)/5 Goal status: INITIAL  3.  Patient will tolerate palpation  of B gluts and piriformis without reports of pain, will also report no hip pain when performing her normal daily activities. Baseline: Fluctuating pain with activity as well as mod TTP. Goal status: INITIAL  4.  Patient will demonstrate improved hip flexor mobility by being able to perform full [facility] test with the test limb lying flat on mat with knee flexion through 1/2 ROM. Baseline: Unable to achieve true [facility] test position due to pain and tightness. Goal status: INITIAL  5.  Patient will ambulate at least 300' on level and unlevel surfaces with no report of pain or unsteadiness, demonstrating upright posture throughout. Baseline: flexed posture, limited distances. Goal status: INITIAL  PLAN:  PT FREQUENCY: 1-2x/week  PT DURATION: 8 weeks  PLANNED INTERVENTIONS: Therapeutic exercises, Therapeutic activity, Neuromuscular re-education, Balance training, Gait training, Patient/Family education, Self Care, Joint mobilization, Stair training, Dry Needling, Electrical stimulation, Cryotherapy, Moist heat, Taping, Ionotophoresis 4mg /ml Dexamethasone, and Manual therapy.  PLAN FOR NEXT SESSION: Review HEP, provide her written copy. STM to B gluts and piriformis with stretch and strengthening.   Iona Beard, DPT 01/03/2023, 2:28 PM

## 2023-01-03 NOTE — Addendum Note (Signed)
Addended by: Rosita Kea on: 01/03/2023 03:49 PM   Modules accepted: Orders

## 2023-01-04 LAB — BASIC METABOLIC PANEL
BUN: 15 mg/dL (ref 6–23)
CO2: 28 mEq/L (ref 19–32)
Calcium: 9.9 mg/dL (ref 8.4–10.5)
Chloride: 102 mEq/L (ref 96–112)
Creatinine, Ser: 0.7 mg/dL (ref 0.40–1.20)
GFR: 83.48 mL/min (ref >60.00)
Glucose, Bld: 85 mg/dL (ref 70–99)
Potassium: 5.1 mEq/L (ref 3.5–5.1)
Sodium: 138 mEq/L (ref 135–145)

## 2023-01-04 LAB — COMPREHENSIVE METABOLIC PANEL
ALT: 14 U/L (ref 0–35)
AST: 19 U/L (ref 0–37)
Albumin: 4.3 g/dL (ref 3.5–5.2)
Alkaline Phosphatase: 90 U/L (ref 39–117)
BUN: 15 mg/dL (ref 6–23)
CO2: 28 mEq/L (ref 19–32)
Calcium: 9.9 mg/dL (ref 8.4–10.5)
Chloride: 102 mEq/L (ref 96–112)
Creatinine, Ser: 0.7 mg/dL (ref 0.40–1.20)
GFR: 83.48 mL/min (ref >60.00)
Glucose, Bld: 85 mg/dL (ref 70–99)
Potassium: 5.1 mEq/L (ref 3.5–5.1)
Sodium: 138 mEq/L (ref 135–145)
Total Bilirubin: 0.5 mg/dL (ref 0.2–1.2)
Total Protein: 6.8 g/dL (ref 6.0–8.3)

## 2023-01-04 LAB — LIPID PANEL
Cholesterol: 200 mg/dL (ref 0–200)
HDL: 66.1 mg/dL (ref >39.00)
LDL Cholesterol: 111 mg/dL — ABNORMAL HIGH (ref 0–99)
NonHDL: 133.74
Total CHOL/HDL Ratio: 3
Triglycerides: 114 mg/dL (ref 0.0–149.0)
VLDL: 22.8 mg/dL (ref 0.0–40.0)

## 2023-01-04 LAB — APTT: aPTT: 38.9 s — ABNORMAL HIGH (ref 25.4–36.8)

## 2023-01-07 ENCOUNTER — Encounter: Payer: Self-pay | Admitting: Family Medicine

## 2023-01-07 ENCOUNTER — Other Ambulatory Visit: Payer: Self-pay | Admitting: Family Medicine

## 2023-01-07 DIAGNOSIS — R791 Abnormal coagulation profile: Secondary | ICD-10-CM

## 2023-01-08 ENCOUNTER — Ambulatory Visit: Payer: Medicare Other

## 2023-01-08 ENCOUNTER — Other Ambulatory Visit (INDEPENDENT_AMBULATORY_CARE_PROVIDER_SITE_OTHER): Payer: Medicare Other

## 2023-01-08 DIAGNOSIS — G8929 Other chronic pain: Secondary | ICD-10-CM | POA: Diagnosis not present

## 2023-01-08 DIAGNOSIS — M6281 Muscle weakness (generalized): Secondary | ICD-10-CM | POA: Diagnosis not present

## 2023-01-08 DIAGNOSIS — R791 Abnormal coagulation profile: Secondary | ICD-10-CM | POA: Diagnosis not present

## 2023-01-08 DIAGNOSIS — R262 Difficulty in walking, not elsewhere classified: Secondary | ICD-10-CM | POA: Diagnosis not present

## 2023-01-08 DIAGNOSIS — R293 Abnormal posture: Secondary | ICD-10-CM | POA: Diagnosis not present

## 2023-01-08 DIAGNOSIS — M25552 Pain in left hip: Secondary | ICD-10-CM | POA: Diagnosis not present

## 2023-01-08 DIAGNOSIS — M25551 Pain in right hip: Secondary | ICD-10-CM | POA: Diagnosis not present

## 2023-01-08 LAB — PROTIME-INR
INR: 1.2 ratio — ABNORMAL HIGH (ref 0.8–1.0)
Prothrombin Time: 12.4 s (ref 9.6–13.1)

## 2023-01-08 LAB — APTT: aPTT: 35.3 s (ref 25.4–36.8)

## 2023-01-08 NOTE — Therapy (Signed)
OUTPATIENT PHYSICAL THERAPY THORACOLUMBAR TREATMENT   Patient Name: Kayla [name] MRN: [id number] DOB:Oct 12, 1945, 77 y.o., female Today's Date: 01/08/2023  END OF SESSION:  PT End of Session - 01/08/23 1232     Visit Number 3    Date for PT Re-Evaluation 02/26/23    PT Start Time 1230    PT Stop Time 1315    PT Time Calculation (min) 45 min    Activity Tolerance Patient tolerated treatment well    Behavior During Therapy [facility] for tasks assessed/performed              Past Medical History:  Diagnosis Date   Abscess of left groin 11/10/2014   Allergy    Asthma    Cellulitis 10/20/2014   Diverticulitis    Diverticulosis    Fasting hyperglycemia 04/2011   FBS 108   Fibromyalgia    Fundic gland polyps of stomach, benign    GERD (gastroesophageal reflux disease)    gastric polyp x3   Glaucoma     Dr Hazle Quant   Hypertension    IBS (irritable bowel syndrome)    Osteopenia    last 07/2011   Pneumonia     OP as child   Shingles    Past Surgical History:  Procedure Laterality Date   COLONOSCOPY W/ POLYPECTOMY  1993,2002,2007   last  colonoscopy 2007, Dr Leone Payor   DILATION AND CURETTAGE OF UTERUS     ESOPHAGOGASTRODUODENOSCOPY     SPINE SURGERY     T9-L5 fusions   Patient Active Problem List   Diagnosis Date Noted   Bilateral low back pain with sciatica 06/12/2022   Weakness 06/12/2022   Pulmonary nodule less than 1 cm in diameter with low risk for malignant neoplasm 03/14/2021   Asthmatic bronchitis 03/09/2020   Allergies 01/18/2020   Preventative health care 03/29/2019   Pneumonia of left lower lobe due to infectious organism 09/01/2018   Vaginal atrophy 03/24/2018   Seasonal allergies 02/08/2018   Erythema nodosum 06/09/2016   Herpes zoster 09/23/2013   Superficial bruising of finger 02/16/2013   Hyperlipidemia 11/01/2011   Palpitations 05/23/2011   Essential hypertension 04/23/2011   Cough 04/09/2011   THYROID FUNCTION TEST, ABNORMAL 12/20/2009    COLONIC POLYPS, HX OF 11/08/2008   Asthma 01/21/2008   GERD 01/21/2008   IRRITABLE BOWEL SYNDROME 01/21/2008   FIBROMYALGIA 01/21/2008   Vitamin D deficiency 04/02/2007   DEGENERATIVE DISC DISEASE 04/02/2007   GLAUCOMA, LOW TENSION 02/06/2007   Osteoporosis 02/06/2007    PCP: Donato Schultz, DO  REFERRING PROVIDER: Barnett Abu, MD  REFERRING DIAG:  Diagnosis  Z87.39 (ICD-10-CM) - Personal history of other diseases of the musculoskeletal system and connective tissue  Postural exercise program for scoliosis emphasizing neutral and extension exercises.  Rationale for Evaluation and Treatment: Rehabilitation  THERAPY DIAG:  Muscle weakness (generalized)  Chronic hip pain, bilateral  Abnormal posture  ONSET DATE: 12/20/22  SUBJECTIVE:  SUBJECTIVE STATEMENT: The weekend has been really busy, but I haven't done much since I have been here. Still have some tightness in the back, but no pain.   PERTINENT HISTORY:  Per referring provider S/P T9-L5 fusion years ago  PAIN:  Are you having pain? Yes: NPRS scale: 0/10 Pain location: low back B Pain description: soreness Aggravating factors: Unknown, fluctuates Relieving factors: Tylenol.  PRECAUTIONS: None  WEIGHT BEARING RESTRICTIONS: No  FALLS:  Has patient fallen in last 6 months? No  LIVING ENVIRONMENT: Lives with: lives with their family and lives alone Lives in: House/apartment Stairs: Yes: Internal: 14 steps; can reach both and External: 3 steps; can reach both Has following equipment at home: None  OCCUPATION: N/A-exercises regularly.  PLOF: Independent  PATIENT GOALS: Patient would like to learn some exercises to decrease the pain in her back. Improve posture.  NEXT MD VISIT: None scheduled with Dr  Danielle Dess.  OBJECTIVE:   DIAGNOSTIC FINDINGS:  Per referring provider-X rays in Oct show pedicle screws and hardware in her spine with U rod connection from L5-T9. Apparent old fracture of the L sided rod that appears old and stable. Adjacent T spine appear stable.  SCREENING FOR RED FLAGS: Bowel or bladder incontinence: No Spinal tumors: No Cauda equina syndrome: No Compression fracture: No Abdominal aneurysm: No  COGNITION: Overall cognitive status: Within functional limits for tasks assessed     SENSATION: WFL  MUSCLE LENGTH: Hamstrings: B WNL, nearly 90 Thomas test: B hip flexor tightness noted, moderate  POSTURE: rounded shoulders, forward head, decreased lumbar lordosis, decreased thoracic kyphosis, anterior pelvic tilt, and rib hump on L thoracic spine  PALPATION: TTP B along gluts origin and piriformis, R > L.  LUMBAR ROM:   AROM eval  Flexion To feet  Extension 60%  Right lateral flexion To knee  Left lateral flexion Mid thigh  Right rotation WNL  Left rotation WNL   (Blank rows = not tested)  LOWER EXTREMITY ROM:   WFL except hip ext limited   LOWER EXTREMITY MMT:  B hips 4-(4-)/5, kneesand ankles 5/5  FUNCTIONAL TESTS:  5 times sit to stand: 14.8 Functional gait assessment: TBD  GAIT: Distance walked: In clinic distances Assistive device utilized: None Level of assistance: Complete Independence Comments: slightly flexed posture noted with excessive trunk sway.  TODAY'S TREATMENT:                                                                                                                              DATE:  01/08/23 NuStep L5 x35mins  Walking outdoors Seated rows and lats 15# 2x10 blackTB ext 2x10 Stretching HS, SKTC, DKTC, glutes, piriformis STS 2x10  Horizontal abd red 2x10    01/02/24 NuStep L4 x 7 min Shoulder Ext 5lb 2x10 Seated Rows & Lats 15lb 2x10 Bridges LE on Pball bridges, oblg, K2C Passive HS, piriformis, K2C, lower trunk  rotations.  01/01/23  Education Supine-Figure 4 stretch, glut stretch, thomas stretch  PATIENT EDUCATION:  Education details: POC Person educated: Patient Education method: Explanation Education comprehension: verbalized understanding  HOME EXERCISE PROGRAM: [id number] identified, but did not review with patient or provide written copy. Initiated figure 4 and gluts stretches, thomas stretch with demo and return demo.  ASSESSMENT:  CLINICAL IMPRESSION: Patient is doing well, no complaints of pain. Walking outdoors, she has some small staggers and tends to have a flexed posture. We focused on some postural strengthening and stretching for the low back. Needs cues for form.   OBJECTIVE IMPAIRMENTS: Abnormal gait, decreased activity tolerance, decreased balance, decreased coordination, decreased endurance, difficulty walking, decreased ROM, decreased strength, increased muscle spasms, impaired flexibility, improper body mechanics, postural dysfunction, and pain.   ACTIVITY LIMITATIONS: carrying, lifting, bending, sitting, squatting, and locomotion level  PARTICIPATION LIMITATIONS: meal prep, cleaning, shopping, and community activity  PERSONAL FACTORS: Age, Past/current experiences, and 1 comorbidity: Multi-level fusion years ago from T9-L5  are also affecting patient's functional outcome.   REHAB POTENTIAL: Good  CLINICAL DECISION MAKING: Stable/uncomplicated  EVALUATION COMPLEXITY: Low   GOALS: Goals reviewed with patient? Yes  SHORT TERM GOALS: Target date: 01/13/23  I with initial HEP Baseline: Goal status: progressing  LONG TERM GOALS: Target date: 02/26/23  I with final HEP Baseline:  Goal status: INITIAL  2.  Increase B hip strength to 4+/5 Baseline: 4-(4-)/5 Goal status: INITIAL  3.  Patient will tolerate palpation of B gluts and piriformis without reports of pain, will also report no hip pain when performing her normal daily activities. Baseline:  Fluctuating pain with activity as well as mod TTP. Goal status: INITIAL  4.  Patient will demonstrate improved hip flexor mobility by being able to perform full [facility] test with the test limb lying flat on mat with knee flexion through 1/2 ROM. Baseline: Unable to achieve true [facility] test position due to pain and tightness. Goal status: INITIAL  5.  Patient will ambulate at least 300' on level and unlevel surfaces with no report of pain or unsteadiness, demonstrating upright posture throughout. Baseline: flexed posture, limited distances. Goal status: INITIAL  PLAN:  PT FREQUENCY: 1-2x/week  PT DURATION: 8 weeks  PLANNED INTERVENTIONS: Therapeutic exercises, Therapeutic activity, Neuromuscular re-education, Balance training, Gait training, Patient/Family education, Self Care, Joint mobilization, Stair training, Dry Needling, Electrical stimulation, Cryotherapy, Moist heat, Taping, Ionotophoresis 4mg /ml Dexamethasone, and Manual therapy.  PLAN FOR NEXT SESSION: Review HEP, provide her written copy. STM to B gluts and piriformis with stretch and strengthening.   Iona Beard, DPT 01/08/2023, 1:15 PM

## 2023-01-09 ENCOUNTER — Encounter: Payer: Self-pay | Admitting: Family Medicine

## 2023-01-10 ENCOUNTER — Ambulatory Visit: Payer: Medicare Other | Attending: Neurological Surgery

## 2023-01-10 DIAGNOSIS — R293 Abnormal posture: Secondary | ICD-10-CM | POA: Diagnosis not present

## 2023-01-10 DIAGNOSIS — M6281 Muscle weakness (generalized): Secondary | ICD-10-CM | POA: Diagnosis not present

## 2023-01-10 DIAGNOSIS — R262 Difficulty in walking, not elsewhere classified: Secondary | ICD-10-CM | POA: Diagnosis not present

## 2023-01-10 DIAGNOSIS — M25551 Pain in right hip: Secondary | ICD-10-CM | POA: Diagnosis not present

## 2023-01-10 DIAGNOSIS — G8929 Other chronic pain: Secondary | ICD-10-CM | POA: Insufficient documentation

## 2023-01-10 DIAGNOSIS — M25552 Pain in left hip: Secondary | ICD-10-CM | POA: Diagnosis not present

## 2023-01-10 NOTE — Therapy (Signed)
OUTPATIENT PHYSICAL THERAPY THORACOLUMBAR TREATMENT   Patient Name: Kayla [name] MRN: [id number] DOB:[birth year], 77 y.o., female Today's Date: 01/10/2023  END OF SESSION:  PT End of Session - 01/10/23 1231     Visit Number 4    Date for PT Re-Evaluation 02/26/23    PT Start Time 1230    PT Stop Time 1315    PT Time Calculation (min) 45 min    Activity Tolerance Patient tolerated treatment well    Behavior During Therapy [facility] for tasks assessed/performed              Past Medical History:  Diagnosis Date   Abscess of left groin 11/10/2014   Allergy    Asthma    Cellulitis 10/20/2014   Diverticulitis    Diverticulosis    Fasting hyperglycemia 04/2011   FBS 108   Fibromyalgia    Fundic gland polyps of stomach, benign    GERD (gastroesophageal reflux disease)    gastric polyp x3   Glaucoma     Dr Hazle Quant   Hypertension    IBS (irritable bowel syndrome)    Osteopenia    last 07/2011   Pneumonia     OP as child   Shingles    Past Surgical History:  Procedure Laterality Date   COLONOSCOPY W/ POLYPECTOMY  1993,2002,2007   last  colonoscopy 2007, Dr Leone Payor   DILATION AND CURETTAGE OF UTERUS     ESOPHAGOGASTRODUODENOSCOPY     SPINE SURGERY     T9-L5 fusions   Patient Active Problem List   Diagnosis Date Noted   Bilateral low back pain with sciatica 06/12/2022   Weakness 06/12/2022   Pulmonary nodule less than 1 cm in diameter with low risk for malignant neoplasm 03/14/2021   Asthmatic bronchitis 03/09/2020   Allergies 01/18/2020   Preventative health care 03/29/2019   Pneumonia of left lower lobe due to infectious organism 09/01/2018   Vaginal atrophy 03/24/2018   Seasonal allergies 02/08/2018   Erythema nodosum 06/09/2016   Herpes zoster 09/23/2013   Superficial bruising of finger 02/16/2013   Hyperlipidemia 11/01/2011   Palpitations 05/23/2011   Essential hypertension 04/23/2011   Cough 04/09/2011   THYROID FUNCTION TEST, ABNORMAL 12/20/2009    COLONIC POLYPS, HX OF 11/08/2008   Asthma 01/21/2008   GERD 01/21/2008   IRRITABLE BOWEL SYNDROME 01/21/2008   FIBROMYALGIA 01/21/2008   Vitamin D deficiency 04/02/2007   DEGENERATIVE DISC DISEASE 04/02/2007   GLAUCOMA, LOW TENSION 02/06/2007   Osteoporosis 02/06/2007    PCP: Donato Schultz, DO  REFERRING PROVIDER: Barnett Abu, MD  REFERRING DIAG:  Diagnosis  Z87.39 (ICD-10-CM) - Personal history of other diseases of the musculoskeletal system and connective tissue  Postural exercise program for scoliosis emphasizing neutral and extension exercises.  Rationale for Evaluation and Treatment: Rehabilitation  THERAPY DIAG:  Muscle weakness (generalized)  Chronic hip pain, bilateral  Abnormal posture  Difficulty in walking, not elsewhere classified  ONSET DATE: 12/20/22  SUBJECTIVE:  SUBJECTIVE STATEMENT: I feel pretty good.   PERTINENT HISTORY:  Per referring provider S/P T9-L5 fusion years ago  PAIN:  Are you having pain? Yes: NPRS scale: 0/10 Pain location: low back B Pain description: soreness Aggravating factors: Unknown, fluctuates Relieving factors: Tylenol.  PRECAUTIONS: None  WEIGHT BEARING RESTRICTIONS: No  FALLS:  Has patient fallen in last 6 months? No  LIVING ENVIRONMENT: Lives with: lives with their family and lives alone Lives in: House/apartment Stairs: Yes: Internal: 14 steps; can reach both and External: 3 steps; can reach both Has following equipment at home: None  OCCUPATION: N/A-exercises regularly.  PLOF: Independent  PATIENT GOALS: Patient would like to learn some exercises to decrease the pain in her back. Improve posture.  NEXT MD VISIT: None scheduled with Dr Danielle Dess.  OBJECTIVE:   DIAGNOSTIC FINDINGS:  Per referring provider-X rays in  Oct show pedicle screws and hardware in her spine with U rod connection from L5-T9. Apparent old fracture of the L sided rod that appears old and stable. Adjacent T spine appear stable.  SCREENING FOR RED FLAGS: Bowel or bladder incontinence: No Spinal tumors: No Cauda equina syndrome: No Compression fracture: No Abdominal aneurysm: No  COGNITION: Overall cognitive status: Within functional limits for tasks assessed     SENSATION: WFL  MUSCLE LENGTH: Hamstrings: B WNL, nearly 90 Thomas test: B hip flexor tightness noted, moderate  POSTURE: rounded shoulders, forward head, decreased lumbar lordosis, decreased thoracic kyphosis, anterior pelvic tilt, and rib hump on L thoracic spine  PALPATION: TTP B along gluts origin and piriformis, R > L.  LUMBAR ROM:   AROM eval  Flexion To feet  Extension 60%  Right lateral flexion To knee  Left lateral flexion Mid thigh  Right rotation WNL  Left rotation WNL   (Blank rows = not tested)  LOWER EXTREMITY ROM:   WFL except hip ext limited   LOWER EXTREMITY MMT:  B hips 4-(4-)/5, kneesand ankles 5/5  FUNCTIONAL TESTS:  5 times sit to stand: 14.8 Functional gait assessment: TBD  GAIT: Distance walked: In clinic distances Assistive device utilized: None Level of assistance: Complete Independence Comments: slightly flexed posture noted with excessive trunk sway.  TODAY'S TREATMENT:                                                                                                                              DATE:  01/10/23 NuStep L5 x62mins  Resisted gait 20# 4 way x4  Shoulder ext 5# 2x10  Step ups 6" STS on airex 2x10  Walking on beam   01/08/23 NuStep L5 x71mins  Walking outdoors Seated rows and lats 15# 2x10 blackTB ext 2x10 Stretching HS, SKTC, DKTC, glutes, piriformis STS 2x10  Horizontal abd red 2x10    01/02/24 NuStep L4 x 7 min Shoulder Ext 5lb 2x10 Seated Rows & Lats 15lb 2x10 Bridges LE on Pball bridges,  oblg, K2C Passive HS, piriformis, K2C, lower trunk rotations.  01/01/23  Education Supine-Figure 4 stretch, glut stretch, thomas stretch  PATIENT EDUCATION:  Education details: POC Person educated: Patient Education method: Explanation Education comprehension: verbalized understanding  HOME EXERCISE PROGRAM: [id number]-Program identified, but did not review with patient or provide written copy. Initiated figure 4 and gluts stretches, thomas stretch with demo and return demo.  ASSESSMENT:  CLINICAL IMPRESSION: Patient is doing well, no complaints of pain. Does well with resisted gait, some slight LOB but able to recover. We worked on some functional strengthening for legs and balance. Continue to progress.  OBJECTIVE IMPAIRMENTS: Abnormal gait, decreased activity tolerance, decreased balance, decreased coordination, decreased endurance, difficulty walking, decreased ROM, decreased strength, increased muscle spasms, impaired flexibility, improper body mechanics, postural dysfunction, and pain.   ACTIVITY LIMITATIONS: carrying, lifting, bending, sitting, squatting, and locomotion level  PARTICIPATION LIMITATIONS: meal prep, cleaning, shopping, and community activity  PERSONAL FACTORS: Age, Past/current experiences, and 1 comorbidity: Multi-level fusion years ago from T9-L5  are also affecting patient's functional outcome.   REHAB POTENTIAL: Good  CLINICAL DECISION MAKING: Stable/uncomplicated  EVALUATION COMPLEXITY: Low   GOALS: Goals reviewed with patient? Yes  SHORT TERM GOALS: Target date: 01/13/23  I with initial HEP Baseline: Goal status: progressing  LONG TERM GOALS: Target date: 02/26/23  I with final HEP Baseline:  Goal status: INITIAL  2.  Increase B hip strength to 4+/5 Baseline: 4-(4-)/5 Goal status: INITIAL  3.  Patient will tolerate palpation of B gluts and piriformis without reports of pain, will also report no hip pain when performing her normal daily  activities. Baseline: Fluctuating pain with activity as well as mod TTP. Goal status: INITIAL  4.  Patient will demonstrate improved hip flexor mobility by being able to perform full [facility] test with the test limb lying flat on mat with knee flexion through 1/2 ROM. Baseline: Unable to achieve true [facility] test position due to pain and tightness. Goal status: INITIAL  5.  Patient will ambulate at least 300' on level and unlevel surfaces with no report of pain or unsteadiness, demonstrating upright posture throughout. Baseline: flexed posture, limited distances. Goal status: INITIAL  PLAN:  PT FREQUENCY: 1-2x/week  PT DURATION: 8 weeks  PLANNED INTERVENTIONS: Therapeutic exercises, Therapeutic activity, Neuromuscular re-education, Balance training, Gait training, Patient/Family education, Self Care, Joint mobilization, Stair training, Dry Needling, Electrical stimulation, Cryotherapy, Moist heat, Taping, Ionotophoresis 4mg /ml Dexamethasone, and Manual therapy.  PLAN FOR NEXT SESSION: Review HEP, provide her written copy. STM to B gluts and piriformis with stretch and strengthening.   Iona Beard, DPT 01/10/2023, 1:13 PM

## 2023-01-15 ENCOUNTER — Ambulatory Visit: Payer: Medicare Other | Admitting: Physical Therapy

## 2023-01-15 DIAGNOSIS — R293 Abnormal posture: Secondary | ICD-10-CM

## 2023-01-15 DIAGNOSIS — G8929 Other chronic pain: Secondary | ICD-10-CM

## 2023-01-15 DIAGNOSIS — M25552 Pain in left hip: Secondary | ICD-10-CM | POA: Diagnosis not present

## 2023-01-15 DIAGNOSIS — R262 Difficulty in walking, not elsewhere classified: Secondary | ICD-10-CM | POA: Diagnosis not present

## 2023-01-15 DIAGNOSIS — M6281 Muscle weakness (generalized): Secondary | ICD-10-CM | POA: Diagnosis not present

## 2023-01-15 DIAGNOSIS — M25551 Pain in right hip: Secondary | ICD-10-CM | POA: Diagnosis not present

## 2023-01-15 NOTE — Therapy (Signed)
OUTPATIENT PHYSICAL THERAPY THORACOLUMBAR TREATMENT   Patient Name: Kayla [name] MRN: [id number] DOB:18-Sep-1945, 77 y.o., female Today's Date: 01/15/2023  END OF SESSION:  PT End of Session - 01/15/23 1232     Visit Number 5    Date for PT Re-Evaluation 02/26/23    PT Start Time 1230    PT Stop Time 1315    PT Time Calculation (min) 45 min              Past Medical History:  Diagnosis Date   Abscess of left groin 11/10/2014   Allergy    Asthma    Cellulitis 10/20/2014   Diverticulitis    Diverticulosis    Fasting hyperglycemia 04/2011   FBS 108   Fibromyalgia    Fundic gland polyps of stomach, benign    GERD (gastroesophageal reflux disease)    gastric polyp x3   Glaucoma     Dr Hazle Quant   Hypertension    IBS (irritable bowel syndrome)    Osteopenia    last 07/2011   Pneumonia     OP as child   Shingles    Past Surgical History:  Procedure Laterality Date   COLONOSCOPY W/ POLYPECTOMY  1993,2002,2007   last  colonoscopy 2007, Dr Leone Payor   DILATION AND CURETTAGE OF UTERUS     ESOPHAGOGASTRODUODENOSCOPY     SPINE SURGERY     T9-L5 fusions   Patient Active Problem List   Diagnosis Date Noted   Bilateral low back pain with sciatica 06/12/2022   Weakness 06/12/2022   Pulmonary nodule less than 1 cm in diameter with low risk for malignant neoplasm 03/14/2021   Asthmatic bronchitis 03/09/2020   Allergies 01/18/2020   Preventative health care 03/29/2019   Pneumonia of left lower lobe due to infectious organism 09/01/2018   Vaginal atrophy 03/24/2018   Seasonal allergies 02/08/2018   Erythema nodosum 06/09/2016   Herpes zoster 09/23/2013   Superficial bruising of finger 02/16/2013   Hyperlipidemia 11/01/2011   Palpitations 05/23/2011   Essential hypertension 04/23/2011   Cough 04/09/2011   THYROID FUNCTION TEST, ABNORMAL 12/20/2009   COLONIC POLYPS, HX OF 11/08/2008   Asthma 01/21/2008   GERD 01/21/2008   IRRITABLE BOWEL SYNDROME 01/21/2008    FIBROMYALGIA 01/21/2008   Vitamin D deficiency 04/02/2007   DEGENERATIVE DISC DISEASE 04/02/2007   GLAUCOMA, LOW TENSION 02/06/2007   Osteoporosis 02/06/2007    PCP: Donato Schultz, DO  REFERRING PROVIDER: Barnett Abu, MD  REFERRING DIAG:  Diagnosis  Z87.39 (ICD-10-CM) - Personal history of other diseases of the musculoskeletal system and connective tissue  Postural exercise program for scoliosis emphasizing neutral and extension exercises.  Rationale for Evaluation and Treatment: Rehabilitation  THERAPY DIAG:  Muscle weakness (generalized)  Chronic hip pain, bilateral  Abnormal posture  Difficulty in walking, not elsewhere classified  ONSET DATE: 12/20/22  SUBJECTIVE:  SUBJECTIVE STATEMENT: I feel pretty good just hurts when I over do  PERTINENT HISTORY:  Per referring provider S/P T9-L5 fusion years ago  PAIN:  Are you having pain? Yes: NPRS scale: 0/10 Pain location: low back B Pain description: soreness Aggravating factors: Unknown, fluctuates Relieving factors: Tylenol.  PRECAUTIONS: None  WEIGHT BEARING RESTRICTIONS: No  FALLS:  Has patient fallen in last 6 months? No  LIVING ENVIRONMENT: Lives with: lives with their family and lives alone Lives in: House/apartment Stairs: Yes: Internal: 14 steps; can reach both and External: 3 steps; can reach both Has following equipment at home: None  OCCUPATION: N/A-exercises regularly.  PLOF: Independent  PATIENT GOALS: Patient would like to learn some exercises to decrease the pain in her back. Improve posture.  NEXT MD VISIT: None scheduled with Dr Danielle Dess.  OBJECTIVE:   DIAGNOSTIC FINDINGS:  Per referring provider-X rays in Oct show pedicle screws and hardware in her spine with U rod connection from L5-T9.  Apparent old fracture of the L sided rod that appears old and stable. Adjacent T spine appear stable.  SCREENING FOR RED FLAGS: Bowel or bladder incontinence: No Spinal tumors: No Cauda equina syndrome: No Compression fracture: No Abdominal aneurysm: No  COGNITION: Overall cognitive status: Within functional limits for tasks assessed     SENSATION: WFL  MUSCLE LENGTH: Hamstrings: B WNL, nearly 90 Thomas test: B hip flexor tightness noted, moderate  POSTURE: rounded shoulders, forward head, decreased lumbar lordosis, decreased thoracic kyphosis, anterior pelvic tilt, and rib hump on L thoracic spine  PALPATION: TTP B along gluts origin and piriformis, R > L.  LUMBAR ROM:   AROM eval  Flexion To feet  Extension 60%  Right lateral flexion To knee  Left lateral flexion Mid thigh  Right rotation WNL  Left rotation WNL   (Blank rows = not tested)  LOWER EXTREMITY ROM:   WFL except hip ext limited   LOWER EXTREMITY MMT:  B hips 4-(4-)/5, kneesand ankles 5/5  FUNCTIONAL TESTS:  5 times sit to stand: 14.8 Functional gait assessment: TBD  GAIT: Distance walked: In clinic distances Assistive device utilized: None Level of assistance: Complete Independence Comments: slightly flexed posture noted with excessive trunk sway.  TODAY'S TREATMENT:                                                                                                                              DATE:   01/15/23 Nustep L 5 Lat Pull 20# 2 sets 10 Seated Row 20# 2 sets 10 Black tband trunk ext 2 sets 10 STS on airex with wt ball chest press 10 x Step up with opp leg ext 10 x , then lateral step up with opp leg abd 10x Shoulder ext 5# 2x10 on airex Leg Press 30# 3 sets 10,calf raises 2 sest 15   01/10/23 NuStep L5 x76mins  Resisted gait 20# 4 way x4  Shoulder ext 5# 2x10  Step ups 6"  STS on airex 2x10  Walking on beam   01/08/23 NuStep L5 x61mins  Walking outdoors Seated rows and lats 15#  2x10 blackTB ext 2x10 Stretching HS, SKTC, DKTC, glutes, piriformis STS 2x10  Horizontal abd red 2x10    01/02/24 NuStep L4 x 7 min Shoulder Ext 5lb 2x10 Seated Rows & Lats 15lb 2x10 Bridges LE on Pball bridges, oblg, K2C Passive HS, piriformis, K2C, lower trunk rotations.  01/01/23  Education Supine-Figure 4 stretch, glut stretch, thomas stretch  PATIENT EDUCATION:  Education details: POC Person educated: Patient Education method: Explanation Education comprehension: verbalized understanding  HOME EXERCISE PROGRAM: [id number] identified, but did not review with patient or provide written copy. Initiated figure 4 and gluts stretches, thomas stretch with demo and return demo.  ASSESSMENT:  CLINICAL IMPRESSION: Patient is doing well, no complaints of pain. Cuing to control full mvmt with ex. We worked on some functional strengthening for legs and balance. Continue to progress. STG met  OBJECTIVE IMPAIRMENTS: Abnormal gait, decreased activity tolerance, decreased balance, decreased coordination, decreased endurance, difficulty walking, decreased ROM, decreased strength, increased muscle spasms, impaired flexibility, improper body mechanics, postural dysfunction, and pain.   ACTIVITY LIMITATIONS: carrying, lifting, bending, sitting, squatting, and locomotion level  PARTICIPATION LIMITATIONS: meal prep, cleaning, shopping, and community activity  PERSONAL FACTORS: Age, Past/current experiences, and 1 comorbidity: Multi-level fusion years ago from T9-L5  are also affecting patient's functional outcome.   REHAB POTENTIAL: Good  CLINICAL DECISION MAKING: Stable/uncomplicated  EVALUATION COMPLEXITY: Low   GOALS: Goals reviewed with patient? Yes  SHORT TERM GOALS: Target date: 01/13/23  I with initial HEP Baseline: Goal status: 01/15/23 MET  LONG TERM GOALS: Target date: 02/26/23  I with final HEP Baseline:  Goal status: INITIAL  2.  Increase B hip strength to  4+/5 Baseline: 4-(4-)/5 Goal status: INITIAL  3.  Patient will tolerate palpation of B gluts and piriformis without reports of pain, will also report no hip pain when performing her normal daily activities. Baseline: Fluctuating pain with activity as well as mod TTP. Goal status: INITIAL  4.  Patient will demonstrate improved hip flexor mobility by being able to perform full [facility] test with the test limb lying flat on mat with knee flexion through 1/2 ROM. Baseline: Unable to achieve true [facility] test position due to pain and tightness. Goal status: INITIAL  5.  Patient will ambulate at least 300' on level and unlevel surfaces with no report of pain or unsteadiness, demonstrating upright posture throughout. Baseline: flexed posture, limited distances. Goal status: INITIAL  PLAN:  PT FREQUENCY: 1-2x/week  PT DURATION: 8 weeks  PLANNED INTERVENTIONS: Therapeutic exercises, Therapeutic activity, Neuromuscular re-education, Balance training, Gait training, Patient/Family education, Self Care, Joint mobilization, Stair training, Dry Needling, Electrical stimulation, Cryotherapy, Moist heat, Taping, Ionotophoresis 4mg /ml Dexamethasone, and Manual therapy.  PLAN FOR NEXT SESSION: Review HEP, provide her written copy. STM to B gluts and piriformis with stretch and strengthening.   Encounter Date: 01/15/2023   Nicola Girt 01/15/2023, 12:33 PM  [facility] at [facility] [address] [place], [place], [address] Phone: [phone]   Fax:  [phone]

## 2023-01-17 ENCOUNTER — Ambulatory Visit: Payer: Medicare Other | Admitting: Physical Therapy

## 2023-01-17 DIAGNOSIS — R262 Difficulty in walking, not elsewhere classified: Secondary | ICD-10-CM | POA: Diagnosis not present

## 2023-01-17 DIAGNOSIS — M25551 Pain in right hip: Secondary | ICD-10-CM | POA: Diagnosis not present

## 2023-01-17 DIAGNOSIS — M6281 Muscle weakness (generalized): Secondary | ICD-10-CM

## 2023-01-17 DIAGNOSIS — R293 Abnormal posture: Secondary | ICD-10-CM

## 2023-01-17 DIAGNOSIS — M25552 Pain in left hip: Secondary | ICD-10-CM | POA: Diagnosis not present

## 2023-01-17 DIAGNOSIS — G8929 Other chronic pain: Secondary | ICD-10-CM | POA: Diagnosis not present

## 2023-01-17 NOTE — Therapy (Signed)
OUTPATIENT PHYSICAL THERAPY THORACOLUMBAR TREATMENT   Patient Name: Kayla [name] MRN: [id number] DOB:12/27/1945, 77 y.o., female Today's Date: 01/17/2023  END OF SESSION:  PT End of Session - 01/17/23 1144     Visit Number 6    Date for PT Re-Evaluation 02/26/23    PT Start Time 1143    PT Stop Time 1226    PT Time Calculation (min) 43 min              Past Medical History:  Diagnosis Date   Abscess of left groin 11/10/2014   Allergy    Asthma    Cellulitis 10/20/2014   Diverticulitis    Diverticulosis    Fasting hyperglycemia 04/2011   FBS 108   Fibromyalgia    Fundic gland polyps of stomach, benign    GERD (gastroesophageal reflux disease)    gastric polyp x3   Glaucoma     Dr Hazle Quant   Hypertension    IBS (irritable bowel syndrome)    Osteopenia    last 07/2011   Pneumonia     OP as child   Shingles    Past Surgical History:  Procedure Laterality Date   COLONOSCOPY W/ POLYPECTOMY  1993,2002,2007   last  colonoscopy 2007, Dr Leone Payor   DILATION AND CURETTAGE OF UTERUS     ESOPHAGOGASTRODUODENOSCOPY     SPINE SURGERY     T9-L5 fusions   Patient Active Problem List   Diagnosis Date Noted   Bilateral low back pain with sciatica 06/12/2022   Weakness 06/12/2022   Pulmonary nodule less than 1 cm in diameter with low risk for malignant neoplasm 03/14/2021   Asthmatic bronchitis 03/09/2020   Allergies 01/18/2020   Preventative health care 03/29/2019   Pneumonia of left lower lobe due to infectious organism 09/01/2018   Vaginal atrophy 03/24/2018   Seasonal allergies 02/08/2018   Erythema nodosum 06/09/2016   Herpes zoster 09/23/2013   Superficial bruising of finger 02/16/2013   Hyperlipidemia 11/01/2011   Palpitations 05/23/2011   Essential hypertension 04/23/2011   Cough 04/09/2011   THYROID FUNCTION TEST, ABNORMAL 12/20/2009   COLONIC POLYPS, HX OF 11/08/2008   Asthma 01/21/2008   GERD 01/21/2008   IRRITABLE BOWEL SYNDROME 01/21/2008    FIBROMYALGIA 01/21/2008   Vitamin D deficiency 04/02/2007   DEGENERATIVE DISC DISEASE 04/02/2007   GLAUCOMA, LOW TENSION 02/06/2007   Osteoporosis 02/06/2007    PCP: Donato Schultz, DO  REFERRING PROVIDER: Barnett Abu, MD  REFERRING DIAG:  Diagnosis  Z87.39 (ICD-10-CM) - Personal history of other diseases of the musculoskeletal system and connective tissue  Postural exercise program for scoliosis emphasizing neutral and extension exercises.  Rationale for Evaluation and Treatment: Rehabilitation  THERAPY DIAG:  Muscle weakness (generalized)  Abnormal posture  Difficulty in walking, not elsewhere classified  ONSET DATE: 12/20/22  SUBJECTIVE:  SUBJECTIVE STATEMENT: I am doing better, abdominals were sore after last session but that is good PERTINENT HISTORY:  Per referring provider S/P T9-L5 fusion years ago  PAIN:  Are you having pain? Yes: NPRS scale: 0/10 Pain location: low back B Pain description: soreness Aggravating factors: Unknown, fluctuates Relieving factors: Tylenol.  PRECAUTIONS: None  WEIGHT BEARING RESTRICTIONS: No  FALLS:  Has patient fallen in last 6 months? No  LIVING ENVIRONMENT: Lives with: lives with their family and lives alone Lives in: House/apartment Stairs: Yes: Internal: 14 steps; can reach both and External: 3 steps; can reach both Has following equipment at home: None  OCCUPATION: N/A-exercises regularly.  PLOF: Independent  PATIENT GOALS: Patient would like to learn some exercises to decrease the pain in her back. Improve posture.  NEXT MD VISIT: None scheduled with Dr Danielle Dess.  OBJECTIVE:   DIAGNOSTIC FINDINGS:  Per referring provider-X rays in Oct show pedicle screws and hardware in her spine with U rod connection from L5-T9. Apparent  old fracture of the L sided rod that appears old and stable. Adjacent T spine appear stable.  SCREENING FOR RED FLAGS: Bowel or bladder incontinence: No Spinal tumors: No Cauda equina syndrome: No Compression fracture: No Abdominal aneurysm: No  COGNITION: Overall cognitive status: Within functional limits for tasks assessed     SENSATION: WFL  MUSCLE LENGTH: Hamstrings: B WNL, nearly 90 Thomas test: B hip flexor tightness noted, moderate  POSTURE: rounded shoulders, forward head, decreased lumbar lordosis, decreased thoracic kyphosis, anterior pelvic tilt, and rib hump on L thoracic spine  PALPATION: TTP B along gluts origin and piriformis, R > L.  LUMBAR ROM:   AROM eval  Flexion To feet  Extension 60%  Right lateral flexion To knee  Left lateral flexion Mid thigh  Right rotation WNL  Left rotation WNL   (Blank rows = not tested)  LOWER EXTREMITY ROM:   WFL except hip ext limited   LOWER EXTREMITY MMT:  B hips 4-(4-)/5, kneesand ankles 5/5  FUNCTIONAL TESTS:  5 times sit to stand: 14.8 Functional gait assessment: TBD  GAIT: Distance walked: In clinic distances Assistive device utilized: None Level of assistance: Complete Independence Comments: slightly flexed posture noted with excessive trunk sway.  TODAY'S TREATMENT:                                                                                                                              DATE:  01/17/23 Nustep L 5 Shoulder ext 5# 2x10 on airex then row 10# 2 sets 10 Black tband trunk ext and flex 2 sets 10- issued for HEP Lat pull 25# 2 sets 10 6# lateral trunk flex 2 sets 10 each side Framers carry 6# 2 laps each hand STS on airex 10 x with cues to come to full standing then 10x  with  wt ball chest press Cable pulley AR press and rotation 10 x each side   01/15/23 Nustep L 5  Lat Pull 20# 2 sets 10 Seated Row 20# 2 sets 10 Black tband trunk ext 2 sets 10 STS on airex with wt ball chest  press 10 x Step up with opp leg ext 10 x , then lateral step up with opp leg abd 10x Shoulder ext 5# 2x10 on airex Leg Press 30# 3 sets 10,calf raises 2 sest 15   01/10/23 NuStep L5 x41mins  Resisted gait 20# 4 way x4  Shoulder ext 5# 2x10  Step ups 6" STS on airex 2x10  Walking on beam   01/08/23 NuStep L5 x46mins  Walking outdoors Seated rows and lats 15# 2x10 blackTB ext 2x10 Stretching HS, SKTC, DKTC, glutes, piriformis STS 2x10  Horizontal abd red 2x10    01/02/24 NuStep L4 x 7 min Shoulder Ext 5lb 2x10 Seated Rows & Lats 15lb 2x10 Bridges LE on Pball bridges, oblg, K2C Passive HS, piriformis, K2C, lower trunk rotations.  01/01/23  Education Supine-Figure 4 stretch, glut stretch, thomas stretch  PATIENT EDUCATION:  Education details: POC Person educated: Patient Education method: Explanation Education comprehension: verbalized understanding  HOME EXERCISE PROGRAM: [id number]-Program identified, but did not review with patient or provide written copy. Initiated figure 4 and gluts stretches, thomas stretch with demo and return demo.  ASSESSMENT:  CLINICAL IMPRESSION: Patient is doing well, no complaints of pain. Cuing to control full mvmt with ex. We worked on some functional strengthening and really working to maintain upright posture with ex and she needs cuing, pt in rt lateral lean but then as she fatigues flexs trunk.Continue to progress.   OBJECTIVE IMPAIRMENTS: Abnormal gait, decreased activity tolerance, decreased balance, decreased coordination, decreased endurance, difficulty walking, decreased ROM, decreased strength, increased muscle spasms, impaired flexibility, improper body mechanics, postural dysfunction, and pain.   ACTIVITY LIMITATIONS: carrying, lifting, bending, sitting, squatting, and locomotion level  PARTICIPATION LIMITATIONS: meal prep, cleaning, shopping, and community activity  PERSONAL FACTORS: Age, Past/current experiences, and 1  comorbidity: Multi-level fusion years ago from T9-L5  are also affecting patient's functional outcome.   REHAB POTENTIAL: Good  CLINICAL DECISION MAKING: Stable/uncomplicated  EVALUATION COMPLEXITY: Low   GOALS: Goals reviewed with patient? Yes  SHORT TERM GOALS: Target date: 01/13/23  I with initial HEP Baseline: Goal status: 01/15/23 MET  LONG TERM GOALS: Target date: 02/26/23  I with final HEP Baseline:  Goal status: INITIAL  2.  Increase B hip strength to 4+/5 Baseline: 4-(4-)/5 Goal status: INITIAL  3.  Patient will tolerate palpation of B gluts and piriformis without reports of pain, will also report no hip pain when performing her normal daily activities. Baseline: Fluctuating pain with activity as well as mod TTP. Goal status: INITIAL  4.  Patient will demonstrate improved hip flexor mobility by being able to perform full [facility] test with the test limb lying flat on mat with knee flexion through 1/2 ROM. Baseline: Unable to achieve true [facility] test position due to pain and tightness. Goal status: INITIAL  5.  Patient will ambulate at least 300' on level and unlevel surfaces with no report of pain or unsteadiness, demonstrating upright posture throughout. Baseline: flexed posture, limited distances. Goal status: INITIAL  PLAN:  PT FREQUENCY: 1-2x/week  PT DURATION: 8 weeks  PLANNED INTERVENTIONS: Therapeutic exercises, Therapeutic activity, Neuromuscular re-education, Balance training, Gait training, Patient/Family education, Self Care, Joint mobilization, Stair training, Dry Needling, Electrical stimulation, Cryotherapy, Moist heat, Taping, Ionotophoresis 4mg /ml Dexamethasone, and Manual therapy.  PLAN FOR NEXT SESSION: progress postural and core strength. Increase HEP Encounter Date: 01/17/2023  Manroop Jakubowicz,[name], PTA 01/17/2023, 11:45 AM  [facility] at [facility] [address] [place], [place], [address] Phone:  [phone]   Fax:  [phone] at [facility] [address] [place], [place], [address] Phone: [phone]   Fax:  [phone]  Patient Details  Name: Kayla [name] MRN: [id number] Date of Birth: [birth year] Referring Provider:  Donato Schultz, *  Encounter Date: 01/17/2023

## 2023-01-22 ENCOUNTER — Ambulatory Visit: Payer: Medicare Other | Admitting: Physical Therapy

## 2023-01-22 DIAGNOSIS — R262 Difficulty in walking, not elsewhere classified: Secondary | ICD-10-CM | POA: Diagnosis not present

## 2023-01-22 DIAGNOSIS — M25552 Pain in left hip: Secondary | ICD-10-CM | POA: Diagnosis not present

## 2023-01-22 DIAGNOSIS — R293 Abnormal posture: Secondary | ICD-10-CM | POA: Diagnosis not present

## 2023-01-22 DIAGNOSIS — M25551 Pain in right hip: Secondary | ICD-10-CM | POA: Diagnosis not present

## 2023-01-22 DIAGNOSIS — G8929 Other chronic pain: Secondary | ICD-10-CM | POA: Diagnosis not present

## 2023-01-22 DIAGNOSIS — M6281 Muscle weakness (generalized): Secondary | ICD-10-CM | POA: Diagnosis not present

## 2023-01-22 NOTE — Therapy (Signed)
OUTPATIENT PHYSICAL THERAPY THORACOLUMBAR TREATMENT   Patient Name: Kayla [name] MRN: [id number] DOB:[birth year], 77 y.o., female Today's Date: 01/22/2023  END OF SESSION:  PT End of Session - 01/22/23 1234     Visit Number 7    Date for PT Re-Evaluation 02/26/23    PT Start Time 1230    PT Stop Time 1315    PT Time Calculation (min) 45 min              Past Medical History:  Diagnosis Date   Abscess of left groin 11/10/2014   Allergy    Asthma    Cellulitis 10/20/2014   Diverticulitis    Diverticulosis    Fasting hyperglycemia 04/2011   FBS 108   Fibromyalgia    Fundic gland polyps of stomach, benign    GERD (gastroesophageal reflux disease)    gastric polyp x3   Glaucoma     Dr Hazle Quant   Hypertension    IBS (irritable bowel syndrome)    Osteopenia    last 07/2011   Pneumonia     OP as child   Shingles    Past Surgical History:  Procedure Laterality Date   COLONOSCOPY W/ POLYPECTOMY  1993,2002,2007   last  colonoscopy 2007, Dr Leone Payor   DILATION AND CURETTAGE OF UTERUS     ESOPHAGOGASTRODUODENOSCOPY     SPINE SURGERY     T9-L5 fusions   Patient Active Problem List   Diagnosis Date Noted   Bilateral low back pain with sciatica 06/12/2022   Weakness 06/12/2022   Pulmonary nodule less than 1 cm in diameter with low risk for malignant neoplasm 03/14/2021   Asthmatic bronchitis 03/09/2020   Allergies 01/18/2020   Preventative health care 03/29/2019   Pneumonia of left lower lobe due to infectious organism 09/01/2018   Vaginal atrophy 03/24/2018   Seasonal allergies 02/08/2018   Erythema nodosum 06/09/2016   Herpes zoster 09/23/2013   Superficial bruising of finger 02/16/2013   Hyperlipidemia 11/01/2011   Palpitations 05/23/2011   Essential hypertension 04/23/2011   Cough 04/09/2011   THYROID FUNCTION TEST, ABNORMAL 12/20/2009   COLONIC POLYPS, HX OF 11/08/2008   Asthma 01/21/2008   GERD 01/21/2008   IRRITABLE BOWEL SYNDROME 01/21/2008    FIBROMYALGIA 01/21/2008   Vitamin D deficiency 04/02/2007   DEGENERATIVE DISC DISEASE 04/02/2007   GLAUCOMA, LOW TENSION 02/06/2007   Osteoporosis 02/06/2007    PCP: Donato Schultz, DO  REFERRING PROVIDER: Barnett Abu, MD  REFERRING DIAG:  Diagnosis  Z87.39 (ICD-10-CM) - Personal history of other diseases of the musculoskeletal system and connective tissue  Postural exercise program for scoliosis emphasizing neutral and extension exercises.  Rationale for Evaluation and Treatment: Rehabilitation  THERAPY DIAG:  Muscle weakness (generalized)  Abnormal posture  Difficulty in walking, not elsewhere classified  ONSET DATE: 12/20/22  SUBJECTIVE:  SUBJECTIVE STATEMENT: Feeling pretty good PERTINENT HISTORY:  Per referring provider S/P T9-L5 fusion years ago  PAIN:  Are you having pain? Yes: NPRS scale: 2/10 Pain location: low back B Pain description: soreness Aggravating factors: Unknown, fluctuates Relieving factors: Tylenol.  PRECAUTIONS: None  WEIGHT BEARING RESTRICTIONS: No  FALLS:  Has patient fallen in last 6 months? No  LIVING ENVIRONMENT: Lives with: lives with their family and lives alone Lives in: House/apartment Stairs: Yes: Internal: 14 steps; can reach both and External: 3 steps; can reach both Has following equipment at home: None  OCCUPATION: N/A-exercises regularly.  PLOF: Independent  PATIENT GOALS: Patient would like to learn some exercises to decrease the pain in her back. Improve posture.  NEXT MD VISIT: None scheduled with Dr Danielle Dess.  OBJECTIVE:   DIAGNOSTIC FINDINGS:  Per referring provider-X rays in Oct show pedicle screws and hardware in her spine with U rod connection from L5-T9. Apparent old fracture of the L sided rod that appears old and  stable. Adjacent T spine appear stable.  SCREENING FOR RED FLAGS: Bowel or bladder incontinence: No Spinal tumors: No Cauda equina syndrome: No Compression fracture: No Abdominal aneurysm: No  COGNITION: Overall cognitive status: Within functional limits for tasks assessed     SENSATION: WFL  MUSCLE LENGTH: Hamstrings: B WNL, nearly 90 Thomas test: B hip flexor tightness noted, moderate  POSTURE: rounded shoulders, forward head, decreased lumbar lordosis, decreased thoracic kyphosis, anterior pelvic tilt, and rib hump on L thoracic spine  PALPATION: TTP B along gluts origin and piriformis, R > L.  LUMBAR ROM:   AROM eval  Flexion To feet  Extension 60%  Right lateral flexion To knee  Left lateral flexion Mid thigh  Right rotation WNL  Left rotation WNL   (Blank rows = not tested)  LOWER EXTREMITY ROM:   WFL except hip ext limited   LOWER EXTREMITY MMT:  B hips 4-(4-)/5, kneesand ankles 5/5  FUNCTIONAL TESTS:  5 times sit to stand: 14.8 Functional gait assessment: TBD  GAIT: Distance walked: In clinic distances Assistive device utilized: None Level of assistance: Complete Independence Comments: slightly flexed posture noted with excessive trunk sway.  TODAY'S TREATMENT:                                                                                                                              DATE:   01/22/23 Nustep L 5 Upright row into trunk ext 10x 15# 10# cable pulley lateral flexion 15 x each side Cable pulley AR press 10# and rotation  ( decrease to 5#)10 x each side Lat pull and Seated Row 25# 2 sets 10 STS with wt ball press 10 x     01/17/23 Nustep L 5 Shoulder ext 5# 2x10 on airex then row 10# 2 sets 10 Black tband trunk ext and flex 2 sets 10- issued for HEP Lat pull 25# 2 sets 10 6# lateral trunk flex 2 sets 10  each side Framers carry 6# 2 laps each hand STS on airex 10 x with cues to come to full standing then 10x  with  wt ball  chest press Cable pulley AR press and rotation 10 x each side Leg Press 30# 3 sets 10,calf raises 2 sets15    01/15/23 Nustep L 5 Lat Pull 20# 2 sets 10 Seated Row 20# 2 sets 10 Black tband trunk ext 2 sets 10 STS on airex with wt ball chest press 10 x Step up with opp leg ext 10 x , then lateral step up with opp leg abd 10x Shoulder ext 5# 2x10 on airex Leg Press 30# 3 sets 10,calf raises 2 sest 15   01/10/23 NuStep L5 x25mins  Resisted gait 20# 4 way x4  Shoulder ext 5# 2x10  Step ups 6" STS on airex 2x10  Walking on beam   01/08/23 NuStep L5 x83mins  Walking outdoors Seated rows and lats 15# 2x10 blackTB ext 2x10 Stretching HS, SKTC, DKTC, glutes, piriformis STS 2x10  Horizontal abd red 2x10    01/02/24 NuStep L4 x 7 min Shoulder Ext 5lb 2x10 Seated Rows & Lats 15lb 2x10 Bridges LE on Pball bridges, oblg, K2C Passive HS, piriformis, K2C, lower trunk rotations.  01/01/23  Education Supine-Figure 4 stretch, glut stretch, thomas stretch  PATIENT EDUCATION:  Education details: POC Person educated: Patient Education method: Explanation Education comprehension: verbalized understanding  HOME EXERCISE PROGRAM: [id number]-Program identified, but did not review with patient or provide written copy. Initiated figure 4 and gluts stretches, thomas stretch with demo and return demo.  ASSESSMENT:  CLINICAL IMPRESSION: increased core work today with verb and tactile cuing needed OBJECTIVE IMPAIRMENTS: Abnormal gait, decreased activity tolerance, decreased balance, decreased coordination, decreased endurance, difficulty walking, decreased ROM, decreased strength, increased muscle spasms, impaired flexibility, improper body mechanics, postural dysfunction, and pain.   ACTIVITY LIMITATIONS: carrying, lifting, bending, sitting, squatting, and locomotion level  PARTICIPATION LIMITATIONS: meal prep, cleaning, shopping, and community activity  PERSONAL FACTORS: Age,  Past/current experiences, and 1 comorbidity: Multi-level fusion years ago from T9-L5  are also affecting patient's functional outcome.   REHAB POTENTIAL: Good  CLINICAL DECISION MAKING: Stable/uncomplicated  EVALUATION COMPLEXITY: Low   GOALS: Goals reviewed with patient? Yes  SHORT TERM GOALS: Target date: 01/13/23  I with initial HEP Baseline: Goal status: 01/15/23 MET  LONG TERM GOALS: Target date: 02/26/23  I with final HEP Baseline:  Goal status: INITIAL  2.  Increase B hip strength to 4+/5 Baseline: 4-(4-)/5 Goal status: INITIAL  3.  Patient will tolerate palpation of B gluts and piriformis without reports of pain, will also report no hip pain when performing her normal daily activities. Baseline: Fluctuating pain with activity as well as mod TTP. Goal status: INITIAL  4.  Patient will demonstrate improved hip flexor mobility by being able to perform full [facility] test with the test limb lying flat on mat with knee flexion through 1/2 ROM. Baseline: Unable to achieve true [facility] test position due to pain and tightness. Goal status: INITIAL  5.  Patient will ambulate at least 300' on level and unlevel surfaces with no report of pain or unsteadiness, demonstrating upright posture throughout. Baseline: flexed posture, limited distances. Goal status: INITIAL  PLAN:  PT FREQUENCY: 1-2x/week  PT DURATION: 8 weeks  PLANNED INTERVENTIONS: Therapeutic exercises, Therapeutic activity, Neuromuscular re-education, Balance training, Gait training, Patient/Family education, Self Care, Joint mobilization, Stair training, Dry Needling, Electrical stimulation, Cryotherapy, Moist heat, Taping, Ionotophoresis 4mg /ml Dexamethasone, and Manual  therapy.  PLAN FOR NEXT SESSION: progress postural and core strength. Increase HEP. Assess goals Encounter Date: 01/22/2023   Nicola Girt 01/22/2023, 12:34 PM  [facility] Outpatient Rehabilitation at [facility] [address] [facility]. [place], [place], [address] Phone: [phone]   Fax:  [phone] Out[facility] Rehabilitation at [facility] [address] [place], [place], [address] Phone: [phone]   Fax:  [phone]  Patient Details  Name: AKAILA PALANCA MRN: [id number] Date of Birth: 15-Sep-1945 Referring Provider:  Donato Schultz, *  Encounter Date: 01/22/2023 [facility] [place] [facility] Rehabilitation at [facility] [address]. [place], [place], [address] Phone: [phone]   Fax:  [phone]  Patient Details  Name: HEYDY TOMASIK MRN: [id number] Date of Birth: [birth year] Referring Provider:  Donato Schultz, *  Encounter Date: 01/22/2023   Suanne Marker, PTA 01/22/2023, 12:34 PM  [place]  Outpatient Rehabilitation at [facility] [address] [place], [place], [address] Phone: [phone]   Fax:  [phone]

## 2023-01-24 ENCOUNTER — Ambulatory Visit: Payer: Medicare Other | Admitting: Physical Therapy

## 2023-01-24 DIAGNOSIS — R262 Difficulty in walking, not elsewhere classified: Secondary | ICD-10-CM | POA: Diagnosis not present

## 2023-01-24 DIAGNOSIS — R293 Abnormal posture: Secondary | ICD-10-CM | POA: Diagnosis not present

## 2023-01-24 DIAGNOSIS — M25551 Pain in right hip: Secondary | ICD-10-CM | POA: Diagnosis not present

## 2023-01-24 DIAGNOSIS — G8929 Other chronic pain: Secondary | ICD-10-CM | POA: Diagnosis not present

## 2023-01-24 DIAGNOSIS — M6281 Muscle weakness (generalized): Secondary | ICD-10-CM | POA: Diagnosis not present

## 2023-01-24 DIAGNOSIS — M25552 Pain in left hip: Secondary | ICD-10-CM | POA: Diagnosis not present

## 2023-01-24 NOTE — Therapy (Signed)
OUTPATIENT PHYSICAL THERAPY THORACOLUMBAR TREATMENT   Patient Name: Kayla [name] MRN: [id number] DOB:Jun 19, 1946, 77 y.o., female Today's Date: 01/24/2023  END OF SESSION:  PT End of Session - 01/24/23 1143     Visit Number 8    Date for PT Re-Evaluation 02/26/23    PT Start Time 1143    PT Stop Time 1225    PT Time Calculation (min) 42 min              Past Medical History:  Diagnosis Date   Abscess of left groin 11/10/2014   Allergy    Asthma    Cellulitis 10/20/2014   Diverticulitis    Diverticulosis    Fasting hyperglycemia 04/2011   FBS 108   Fibromyalgia    Fundic gland polyps of stomach, benign    GERD (gastroesophageal reflux disease)    gastric polyp x3   Glaucoma     Dr Hazle Quant   Hypertension    IBS (irritable bowel syndrome)    Osteopenia    last 07/2011   Pneumonia     OP as child   Shingles    Past Surgical History:  Procedure Laterality Date   COLONOSCOPY W/ POLYPECTOMY  1993,2002,2007   last  colonoscopy 2007, Dr Leone Payor   DILATION AND CURETTAGE OF UTERUS     ESOPHAGOGASTRODUODENOSCOPY     SPINE SURGERY     T9-L5 fusions   Patient Active Problem List   Diagnosis Date Noted   Bilateral low back pain with sciatica 06/12/2022   Weakness 06/12/2022   Pulmonary nodule less than 1 cm in diameter with low risk for malignant neoplasm 03/14/2021   Asthmatic bronchitis 03/09/2020   Allergies 01/18/2020   Preventative health care 03/29/2019   Pneumonia of left lower lobe due to infectious organism 09/01/2018   Vaginal atrophy 03/24/2018   Seasonal allergies 02/08/2018   Erythema nodosum 06/09/2016   Herpes zoster 09/23/2013   Superficial bruising of finger 02/16/2013   Hyperlipidemia 11/01/2011   Palpitations 05/23/2011   Essential hypertension 04/23/2011   Cough 04/09/2011   THYROID FUNCTION TEST, ABNORMAL 12/20/2009   COLONIC POLYPS, HX OF 11/08/2008   Asthma 01/21/2008   GERD 01/21/2008   IRRITABLE BOWEL SYNDROME 01/21/2008    FIBROMYALGIA 01/21/2008   Vitamin D deficiency 04/02/2007   DEGENERATIVE DISC DISEASE 04/02/2007   GLAUCOMA, LOW TENSION 02/06/2007   Osteoporosis 02/06/2007    PCP: Donato Schultz, DO  REFERRING PROVIDER: Barnett Abu, MD  REFERRING DIAG:  Diagnosis  Z87.39 (ICD-10-CM) - Personal history of other diseases of the musculoskeletal system and connective tissue  Postural exercise program for scoliosis emphasizing neutral and extension exercises.  Rationale for Evaluation and Treatment: Rehabilitation  THERAPY DIAG:  Muscle weakness (generalized)  Abnormal posture  Difficulty in walking, not elsewhere classified  ONSET DATE: 12/20/22  SUBJECTIVE:     feeling better overall, back much less ore, no pain  SUBJECTIVE STATEMENT: Feeling pretty good PERTINENT HISTORY:  Per referring provider S/P T9-L5 fusion years ago  PAIN:  Are you having pain? Yes: NPRS scale: 2/10 Pain location: low back B Pain description: soreness Aggravating factors: Unknown, fluctuates Relieving factors: Tylenol.  PRECAUTIONS: None  WEIGHT BEARING RESTRICTIONS: No  FALLS:  Has patient fallen in last 6 months? No  LIVING ENVIRONMENT: Lives with: lives with their family and lives alone Lives in: House/apartment Stairs: Yes: Internal: 14 steps; can reach both and External: 3 steps; can reach both Has following equipment at home: None  OCCUPATION: N/A-exercises regularly.  PLOF: Independent  PATIENT GOALS: Patient would like to learn some exercises to decrease the pain in her back. Improve posture.  NEXT MD VISIT: None scheduled with Dr Danielle Dess.  OBJECTIVE:   DIAGNOSTIC FINDINGS:  Per referring provider-X rays in Oct show pedicle screws and hardware in her spine with U rod connection from L5-T9. Apparent old  fracture of the L sided rod that appears old and stable. Adjacent T spine appear stable.  SCREENING FOR RED FLAGS: Bowel or bladder incontinence: No Spinal tumors: No Cauda equina syndrome: No Compression fracture: No Abdominal aneurysm: No  COGNITION: Overall cognitive status: Within functional limits for tasks assessed     SENSATION: WFL  MUSCLE LENGTH: Hamstrings: B WNL, nearly 90 Thomas test: B hip flexor tightness noted, moderate  POSTURE: rounded shoulders, forward head, decreased lumbar lordosis, decreased thoracic kyphosis, anterior pelvic tilt, and rib hump on L thoracic spine  PALPATION: TTP B along gluts origin and piriformis, R > L.  LUMBAR ROM:   AROM eval  Flexion To feet  Extension 60%  Right lateral flexion To knee  Left lateral flexion Mid thigh  Right rotation WNL  Left rotation WNL   (Blank rows = not tested)  LOWER EXTREMITY ROM:   WFL except hip ext limited   LOWER EXTREMITY MMT:  B hips 4-(4-)/5, kneesand ankles 5/5  FUNCTIONAL TESTS:  5 times sit to stand: 14.8 Functional gait assessment: TBD  GAIT: Distance walked: In clinic distances Assistive device utilized: None Level of assistance: Complete Independence Comments: slightly flexed posture noted with excessive trunk sway.  TODAY'S TREATMENT:                                                                                                                              DATE:   01/24/23 Reviewed Y ex- added row and trun flex resisted- pt goes 2-3 x a week and has great routine and HEPS - reviewed ball ex, stretches and tband ex ( LE and scap stab) Walked around back building working on maintaining upright posture- hard going up hill to maintain. Mild SOB. Mod I step up but step down needs assist STS with wt ball press 10 x  01/22/23 Nustep L 5 Upright row into trunk ext 10x 15# 10# cable pulley lateral flexion 15 x each side Cable pulley AR press 10# and rotation  (  decrease to  5#)10 x each side Lat pull and Seated Row 25# 2 sets 10 STS with wt ball press 10 x     01/17/23 Nustep L 5 Shoulder ext 5# 2x10 on airex then row 10# 2 sets 10 Black tband trunk ext and flex 2 sets 10- issued for HEP Lat pull 25# 2 sets 10 6# lateral trunk flex 2 sets 10 each side Framers carry 6# 2 laps each hand STS on airex 10 x with cues to come to full standing then 10x  with  wt ball chest press Cable pulley AR press and rotation 10 x each side Leg Press 30# 3 sets 10,calf raises 2 sets15    01/15/23 Nustep L 5 Lat Pull 20# 2 sets 10 Seated Row 20# 2 sets 10 Black tband trunk ext 2 sets 10 STS on airex with wt ball chest press 10 x Step up with opp leg ext 10 x , then lateral step up with opp leg abd 10x Shoulder ext 5# 2x10 on airex Leg Press 30# 3 sets 10,calf raises 2 sest 15   01/10/23 NuStep L5 x80mins  Resisted gait 20# 4 way x4  Shoulder ext 5# 2x10  Step ups 6" STS on airex 2x10  Walking on beam   01/08/23 NuStep L5 x77mins  Walking outdoors Seated rows and lats 15# 2x10 blackTB ext 2x10 Stretching HS, SKTC, DKTC, glutes, piriformis STS 2x10  Horizontal abd red 2x10    01/02/24 NuStep L4 x 7 min Shoulder Ext 5lb 2x10 Seated Rows & Lats 15lb 2x10 Bridges LE on Pball bridges, oblg, K2C Passive HS, piriformis, K2C, lower trunk rotations.  01/01/23  Education Supine-Figure 4 stretch, glut stretch, thomas stretch  PATIENT EDUCATION:  Education details: POC Person educated: Patient Education method: Explanation Education comprehension: verbalized understanding  HOME EXERCISE PROGRAM: [id number]-Program identified, but did not review with patient or provide written copy. Initiated figure 4 and gluts stretches, thomas stretch with demo and return demo.  ASSESSMENT:  CLINICAL IMPRESSION:  Assessed goals, gym routine and HEPS- verb review and demo- she has great programs. Progressed as documented above.  OBJECTIVE IMPAIRMENTS: Abnormal  gait, decreased activity tolerance, decreased balance, decreased coordination, decreased endurance, difficulty walking, decreased ROM, decreased strength, increased muscle spasms, impaired flexibility, improper body mechanics, postural dysfunction, and pain.   ACTIVITY LIMITATIONS: carrying, lifting, bending, sitting, squatting, and locomotion level  PARTICIPATION LIMITATIONS: meal prep, cleaning, shopping, and community activity  PERSONAL FACTORS: Age, Past/current experiences, and 1 comorbidity: Multi-level fusion years ago from T9-L5  are also affecting patient's functional outcome.   REHAB POTENTIAL: Good  CLINICAL DECISION MAKING: Stable/uncomplicated  EVALUATION COMPLEXITY: Low   GOALS: Goals reviewed with patient? Yes  SHORT TERM GOALS: Target date: 01/13/23  I with initial HEP Baseline: Goal status: 01/15/23 MET  LONG TERM GOALS: Target date: 02/26/23  I with final HEP Baseline:  Goal status: INITIAL  2.  Increase B hip strength to 4+/5 Baseline: 4-(4-)/5 Goal status: 01/24/23 progressing  3.  Patient will tolerate palpation of B gluts and piriformis without reports of pain, will also report no hip pain when performing her normal daily activities. Baseline: Fluctuating pain with activity as well as mod TTP. Goal status: MET 01/24/23  4.  Patient will demonstrate improved hip flexor mobility by being able to perform full [employer] test with the test limb lying flat on mat with knee flexion through 1/2 ROM. Baseline: Unable to achieve true [facility] test position due to pain and tightness. Goal  status: progressing 01/24/23  5.  Patient will ambulate at least 300' on level and unlevel surfaces with no report of pain or unsteadiness, demonstrating upright posture throughout. Baseline: flexed posture, limited distances. Goal status: progressing 01/24/23  PLAN:  PT FREQUENCY: 1-2x/week  PT DURATION: 8 weeks  PLANNED INTERVENTIONS: Therapeutic exercises, Therapeutic activity,  Neuromuscular re-education, Balance training, Gait training, Patient/Family education, Self Care, Joint mobilization, Stair training, Dry Needling, Electrical stimulation, Cryotherapy, Moist heat, Taping, Ionotophoresis 4mg /ml Dexamethasone, and Manual therapy.  PLAN FOR NEXT SESSION: progress postural and core strength. GAIT Encounter Date: 01/24/2023   Nicola Girt 01/24/2023, 11:44 AM  [place] Outpatient Rehabilitation at [facility] [address] [facility]. [place], [place], [address] Phone: [phone]   Fax:  [phone] Outpatient Rehabilitation at [facility] [address] [place], [place], [address] Phone: [phone]   Fax:  [phone]  Patient Details  Name: DAVAN LACOSSE MRN: [id number] Date of Birth: November 29, 1945 Referring Provider:  Donato Schultz, *  Encounter Date: 01/24/2023 [facility] Rehabilitation at [facility] [address] [place], [place], [address] Phone: [phone]   Fax:  [phone]  Patient Details  Name: CHARNELLE ALBERTI MRN: [id number] Date of Birth: 1946-09-07 Referring Provider:  Donato Schultz, *  Encounter Date: 01/24/2023   Suanne Marker, PTA 01/24/2023, 11:44 AM  [place] Outpatient Rehabilitation at [facility] [address] [facility]. [place], [place], [address] Phone: [phone]   Fax:  [phone] Outpatient Rehabilitation at [facility] [address]. [place], [place], [address] Phone: [phone]   Fax:  [phone]  Patient Details  Name: ALEASHIA ROTELLA MRN: [id number] Date of Birth: February 10, 1946 Referring Provider:  Donato Schultz, *  Encounter Date: 01/24/2023   Suanne Marker, PTA 01/24/2023, 11:44 AM  [place]  Outpatient Rehabilitation at [facility] [address] [facility]. [place], [place], [address] Phone: [phone]   Fax:  [phone]

## 2023-01-29 ENCOUNTER — Ambulatory Visit: Payer: Medicare Other | Admitting: Physical Therapy

## 2023-01-29 DIAGNOSIS — R293 Abnormal posture: Secondary | ICD-10-CM

## 2023-01-29 DIAGNOSIS — M6281 Muscle weakness (generalized): Secondary | ICD-10-CM

## 2023-01-29 DIAGNOSIS — R262 Difficulty in walking, not elsewhere classified: Secondary | ICD-10-CM

## 2023-01-29 DIAGNOSIS — M25552 Pain in left hip: Secondary | ICD-10-CM | POA: Diagnosis not present

## 2023-01-29 DIAGNOSIS — M25551 Pain in right hip: Secondary | ICD-10-CM | POA: Diagnosis not present

## 2023-01-29 DIAGNOSIS — G8929 Other chronic pain: Secondary | ICD-10-CM | POA: Diagnosis not present

## 2023-01-29 NOTE — Therapy (Signed)
OUTPATIENT PHYSICAL THERAPY THORACOLUMBAR TREATMENT   Patient Name: Kayla [name] MRN: [id number] DOB:[birth year], 77 y.o., female Today's Date: 01/29/2023  END OF SESSION:  PT End of Session - 01/29/23 1230     Visit Number 9    Date for PT Re-Evaluation 02/26/23    PT Start Time 1230    PT Stop Time 1310    PT Time Calculation (min) 40 min              Past Medical History:  Diagnosis Date   Abscess of left groin 11/10/2014   Allergy    Asthma    Cellulitis 10/20/2014   Diverticulitis    Diverticulosis    Fasting hyperglycemia 04/2011   FBS 108   Fibromyalgia    Fundic gland polyps of stomach, benign    GERD (gastroesophageal reflux disease)    gastric polyp x3   Glaucoma     Dr Hazle Quant   Hypertension    IBS (irritable bowel syndrome)    Osteopenia    last 07/2011   Pneumonia     OP as child   Shingles    Past Surgical History:  Procedure Laterality Date   COLONOSCOPY W/ POLYPECTOMY  1993,2002,2007   last  colonoscopy 2007, Dr Leone Payor   DILATION AND CURETTAGE OF UTERUS     ESOPHAGOGASTRODUODENOSCOPY     SPINE SURGERY     T9-L5 fusions   Patient Active Problem List   Diagnosis Date Noted   Bilateral low back pain with sciatica 06/12/2022   Weakness 06/12/2022   Pulmonary nodule less than 1 cm in diameter with low risk for malignant neoplasm 03/14/2021   Asthmatic bronchitis 03/09/2020   Allergies 01/18/2020   Preventative health care 03/29/2019   Pneumonia of left lower lobe due to infectious organism 09/01/2018   Vaginal atrophy 03/24/2018   Seasonal allergies 02/08/2018   Erythema nodosum 06/09/2016   Herpes zoster 09/23/2013   Superficial bruising of finger 02/16/2013   Hyperlipidemia 11/01/2011   Palpitations 05/23/2011   Essential hypertension 04/23/2011   Cough 04/09/2011   THYROID FUNCTION TEST, ABNORMAL 12/20/2009   COLONIC POLYPS, HX OF 11/08/2008   Asthma 01/21/2008   GERD 01/21/2008   IRRITABLE BOWEL SYNDROME 01/21/2008    FIBROMYALGIA 01/21/2008   Vitamin D deficiency 04/02/2007   DEGENERATIVE DISC DISEASE 04/02/2007   GLAUCOMA, LOW TENSION 02/06/2007   Osteoporosis 02/06/2007    PCP: Donato Schultz, DO  REFERRING PROVIDER: Barnett Abu, MD  REFERRING DIAG:  Diagnosis  Z87.39 (ICD-10-CM) - Personal history of other diseases of the musculoskeletal system and connective tissue  Postural exercise program for scoliosis emphasizing neutral and extension exercises.  Rationale for Evaluation and Treatment: Rehabilitation  THERAPY DIAG:  Muscle weakness (generalized)  Abnormal posture  Difficulty in walking, not elsewhere classified  ONSET DATE: 12/20/22  SUBJECTIVE:                                                                            SUBJECTIVE STATEMENT: Feeling pretty good, doing all HEPS and at gym without issue PERTINENT HISTORY:  Per referring provider S/P T9-L5 fusion years ago  PAIN:  Are you having pain? Yes: NPRS scale: 2/10 Pain location: low back B  Pain description: soreness Aggravating factors: Unknown, fluctuates Relieving factors: Tylenol.  PRECAUTIONS: None  WEIGHT BEARING RESTRICTIONS: No  FALLS:  Has patient fallen in last 6 months? No  LIVING ENVIRONMENT: Lives with: lives with their family and lives alone Lives in: House/apartment Stairs: Yes: Internal: 14 steps; can reach both and External: 3 steps; can reach both Has following equipment at home: None  OCCUPATION: N/A-exercises regularly.  PLOF: Independent  PATIENT GOALS: Patient would like to learn some exercises to decrease the pain in her back. Improve posture.  NEXT MD VISIT: None scheduled with Dr Danielle Dess.  OBJECTIVE:   DIAGNOSTIC FINDINGS:  Per referring provider-X rays in Oct show pedicle screws and hardware in her spine with U rod connection from L5-T9. Apparent old fracture of the L sided rod that appears old and stable. Adjacent T spine appear stable.  SCREENING FOR RED  FLAGS: Bowel or bladder incontinence: No Spinal tumors: No Cauda equina syndrome: No Compression fracture: No Abdominal aneurysm: No  COGNITION: Overall cognitive status: Within functional limits for tasks assessed     SENSATION: WFL  MUSCLE LENGTH: Hamstrings: B WNL, nearly 90 Thomas test: B hip flexor tightness noted, moderate  POSTURE: rounded shoulders, forward head, decreased lumbar lordosis, decreased thoracic kyphosis, anterior pelvic tilt, and rib hump on L thoracic spine  PALPATION: TTP B along gluts origin and piriformis, R > L.  LUMBAR ROM:   AROM eval  Flexion To feet  Extension 60%  Right lateral flexion To knee  Left lateral flexion Mid thigh  Right rotation WNL  Left rotation WNL   (Blank rows = not tested)  LOWER EXTREMITY ROM:   WFL except hip ext limited   LOWER EXTREMITY MMT:  B hips 4-(4-)/5, kneesand ankles 5/5  FUNCTIONAL TESTS:  5 times sit to stand: 14.8 Functional gait assessment: TBD  GAIT: Distance walked: In clinic distances Assistive device utilized: None Level of assistance: Complete Independence Comments: slightly flexed posture noted with excessive trunk sway.  TODAY'S TREATMENT:                                                                                                                              DATE:   01/29/23 Walked around back building working on maintaining upright posture- hard going up hill to maintain. Mild SOB ( but able to talk thru entire walk). Mod I step up but step down needs assist 8 min Resited gait 30# 5 x 4 ways, CGA STS with wt ball press 10x STS with wt ball OH 10 x Kettle bell Jimmye Norman carry 2 laps each had 6 inch step up with UE opp leg ext then laterally with opp leg abd  01/24/23 Reviewed Y ex- added row and trun flex resisted- pt goes 2-3 x a week and has great routine and HEPS - reviewed ball ex, stretches and tband ex ( LE and scap stab) Walked around back building working on maintaining upright  posture- hard going up  hill to maintain. Mild SOB. Mod I step up but step down needs assist STS with wt ball press 10 x  01/22/23 Nustep L 5 Upright row into trunk ext 10x 15# 10# cable pulley lateral flexion 15 x each side Cable pulley AR press 10# and rotation  ( decrease to 5#)10 x each side Lat pull and Seated Row 25# 2 sets 10 STS with wt ball press 10 x     01/17/23 Nustep L 5 Shoulder ext 5# 2x10 on airex then row 10# 2 sets 10 Black tband trunk ext and flex 2 sets 10- issued for HEP Lat pull 25# 2 sets 10 6# lateral trunk flex 2 sets 10 each side Framers carry 6# 2 laps each hand STS on airex 10 x with cues to come to full standing then 10x  with  wt ball chest press Cable pulley AR press and rotation 10 x each side Leg Press 30# 3 sets 10,calf raises 2 sets15    01/15/23 Nustep L 5 Lat Pull 20# 2 sets 10 Seated Row 20# 2 sets 10 Black tband trunk ext 2 sets 10 STS on airex with wt ball chest press 10 x Step up with opp leg ext 10 x , then lateral step up with opp leg abd 10x Shoulder ext 5# 2x10 on airex Leg Press 30# 3 sets 10,calf raises 2 sest 15   01/10/23 NuStep L5 x57mins  Resisted gait 20# 4 way x4  Shoulder ext 5# 2x10  Step ups 6" STS on airex 2x10  Walking on beam   01/08/23 NuStep L5 x19mins  Walking outdoors Seated rows and lats 15# 2x10 blackTB ext 2x10 Stretching HS, SKTC, DKTC, glutes, piriformis STS 2x10  Horizontal abd red 2x10    01/02/24 NuStep L4 x 7 min Shoulder Ext 5lb 2x10 Seated Rows & Lats 15lb 2x10 Bridges LE on Pball bridges, oblg, K2C Passive HS, piriformis, K2C, lower trunk rotations.  01/01/23  Education Supine-Figure 4 stretch, glut stretch, thomas stretch  PATIENT EDUCATION:  Education details: POC Person educated: Patient Education method: Explanation Education comprehension: verbalized understanding  HOME EXERCISE PROGRAM: [id number]-Program identified, but did not review with patient or provide  written copy. Initiated figure 4 and gluts stretches, thomas stretch with demo and return demo.  ASSESSMENT:  CLINICAL IMPRESSION:  Progressed ex with emphasis on upright trunk and maintaining with ex OBJECTIVE IMPAIRMENTS: Abnormal gait, decreased activity tolerance, decreased balance, decreased coordination, decreased endurance, difficulty walking, decreased ROM, decreased strength, increased muscle spasms, impaired flexibility, improper body mechanics, postural dysfunction, and pain.   ACTIVITY LIMITATIONS: carrying, lifting, bending, sitting, squatting, and locomotion level  PARTICIPATION LIMITATIONS: meal prep, cleaning, shopping, and community activity  PERSONAL FACTORS: Age, Past/current experiences, and 1 comorbidity: Multi-level fusion years ago from T9-L5  are also affecting patient's functional outcome.   REHAB POTENTIAL: Good  CLINICAL DECISION MAKING: Stable/uncomplicated  EVALUATION COMPLEXITY: Low   GOALS: Goals reviewed with patient? Yes  SHORT TERM GOALS: Target date: 01/13/23  I with initial HEP Baseline: Goal status: 01/15/23 MET  LONG TERM GOALS: Target date: 02/26/23  I with final HEP Baseline:  Goal status: INITIAL  2.  Increase B hip strength to 4+/5 Baseline: 4-(4-)/5 Goal status: 01/24/23 progressing  3.  Patient will tolerate palpation of B gluts and piriformis without reports of pain, will also report no hip pain when performing her normal daily activities. Baseline: Fluctuating pain with activity as well as mod TTP. Goal status: MET 01/24/23  4.  Patient will demonstrate improved hip flexor mobility by being able to perform full [facility] test with the test limb lying flat on mat with knee flexion through 1/2 ROM. Baseline: Unable to achieve true [facility] test position due to pain and tightness. Goal status: progressing 01/24/23  5.  Patient will ambulate at least 300' on level and unlevel surfaces with no report of pain or unsteadiness, demonstrating  upright posture throughout. Baseline: flexed posture, limited distances. Goal status: progressing 01/24/23  PLAN:  PT FREQUENCY: 1-2x/week  PT DURATION: 8 weeks  PLANNED INTERVENTIONS: Therapeutic exercises, Therapeutic activity, Neuromuscular re-education, Balance training, Gait training, Patient/Family education, Self Care, Joint mobilization, Stair training, Dry Needling, Electrical stimulation, Cryotherapy, Moist heat, Taping, Ionotophoresis 4mg /ml Dexamethasone, and Manual therapy.  PLAN FOR NEXT SESSION: progress postural and core strength. GAIT Encounter Date: 01/29/2023   Nicola Girt 01/29/2023, 12:30 PM  [facility] at [facility] [address] [place], [place], [address] Phone: [phone]   Fax:  [phone] at [facility] [address]. [place], [place], [address] Phone: [phone]   Fax:  [phone]  Patient Details  Name: Kayla [name] MRN: [id number] Date of Birth: Jun 07, 1946 Referring Provider:  Donato Schultz, *  Encounter Date: 01/29/2023 [facility] [place] [facility] at [facility] [address] [place], [place], [address] Phone: [phone]   Fax:  [phone]

## 2023-01-31 ENCOUNTER — Ambulatory Visit: Payer: Medicare Other | Admitting: Physical Therapy

## 2023-02-05 ENCOUNTER — Ambulatory Visit: Payer: Medicare Other | Admitting: Physical Therapy

## 2023-02-07 ENCOUNTER — Encounter: Payer: Self-pay | Admitting: Physical Therapy

## 2023-02-07 ENCOUNTER — Ambulatory Visit: Payer: Medicare Other | Admitting: Physical Therapy

## 2023-02-07 DIAGNOSIS — R262 Difficulty in walking, not elsewhere classified: Secondary | ICD-10-CM

## 2023-02-07 DIAGNOSIS — M6281 Muscle weakness (generalized): Secondary | ICD-10-CM

## 2023-02-07 DIAGNOSIS — G8929 Other chronic pain: Secondary | ICD-10-CM

## 2023-02-07 DIAGNOSIS — R293 Abnormal posture: Secondary | ICD-10-CM | POA: Diagnosis not present

## 2023-02-07 DIAGNOSIS — M25551 Pain in right hip: Secondary | ICD-10-CM | POA: Diagnosis not present

## 2023-02-07 DIAGNOSIS — M25552 Pain in left hip: Secondary | ICD-10-CM | POA: Diagnosis not present

## 2023-02-07 NOTE — Therapy (Signed)
OUTPATIENT PHYSICAL THERAPY THORACOLUMBAR TREATMENT Progress Note Reporting Period 01/01/23 to 02/07/23  See note below for Objective Data and Assessment of Progress/Goals.      Patient Name: Kayla [name] MRN: [id number] DOB:01/12/1946, 77 y.o., female Today's Date: 02/07/2023  END OF SESSION:  PT End of Session - 02/07/23 1059     Visit Number 10    Date for PT Re-Evaluation 02/26/23    PT Start Time 1100    PT Stop Time 1145    PT Time Calculation (min) 45 min    Activity Tolerance Patient tolerated treatment well    Behavior During Therapy [facility] for tasks assessed/performed              Past Medical History:  Diagnosis Date   Abscess of left groin 11/10/2014   Allergy    Asthma    Cellulitis 10/20/2014   Diverticulitis    Diverticulosis    Fasting hyperglycemia 04/2011   FBS 108   Fibromyalgia    Fundic gland polyps of stomach, benign    GERD (gastroesophageal reflux disease)    gastric polyp x3   Glaucoma     Dr Hazle Quant   Hypertension    IBS (irritable bowel syndrome)    Osteopenia    last 07/2011   Pneumonia     OP as child   Shingles    Past Surgical History:  Procedure Laterality Date   COLONOSCOPY W/ POLYPECTOMY  1993,2002,2007   last  colonoscopy 2007, Dr Leone Payor   DILATION AND CURETTAGE OF UTERUS     ESOPHAGOGASTRODUODENOSCOPY     SPINE SURGERY     T9-L5 fusions   Patient Active Problem List   Diagnosis Date Noted   Bilateral low back pain with sciatica 06/12/2022   Weakness 06/12/2022   Pulmonary nodule less than 1 cm in diameter with low risk for malignant neoplasm 03/14/2021   Asthmatic bronchitis 03/09/2020   Allergies 01/18/2020   Preventative health care 03/29/2019   Pneumonia of left lower lobe due to infectious organism 09/01/2018   Vaginal atrophy 03/24/2018   Seasonal allergies 02/08/2018   Erythema nodosum 06/09/2016   Herpes zoster 09/23/2013   Superficial bruising of finger 02/16/2013   Hyperlipidemia 11/01/2011    Palpitations 05/23/2011   Essential hypertension 04/23/2011   Cough 04/09/2011   THYROID FUNCTION TEST, ABNORMAL 12/20/2009   COLONIC POLYPS, HX OF 11/08/2008   Asthma 01/21/2008   GERD 01/21/2008   IRRITABLE BOWEL SYNDROME 01/21/2008   FIBROMYALGIA 01/21/2008   Vitamin D deficiency 04/02/2007   DEGENERATIVE DISC DISEASE 04/02/2007   GLAUCOMA, LOW TENSION 02/06/2007   Osteoporosis 02/06/2007    PCP: Donato Schultz, DO  REFERRING PROVIDER: Barnett Abu, MD  REFERRING DIAG:  Diagnosis  Z87.39 (ICD-10-CM) - Personal history of other diseases of the musculoskeletal system and connective tissue  Postural exercise program for scoliosis emphasizing neutral and extension exercises.  Rationale for Evaluation and Treatment: Rehabilitation  THERAPY DIAG:  Muscle weakness (generalized)  Abnormal posture  Difficulty in walking, not elsewhere classified  Chronic hip pain, bilateral  ONSET DATE: 12/20/22  SUBJECTIVE:  SUBJECTIVE STATEMENT: Feeling pretty good, after the last session my feet  and calves were really hurting PERTINENT HISTORY:  Per referring provider S/P T9-L5 fusion years ago  PAIN:  Are you having pain? Yes: NPRS scale: 2/10 Pain location: low back B Pain description: soreness Aggravating factors: Unknown, fluctuates Relieving factors: Tylenol.  PRECAUTIONS: None  WEIGHT BEARING RESTRICTIONS: No  FALLS:  Has patient fallen in last 6 months? No  LIVING ENVIRONMENT: Lives with: lives with their family and lives alone Lives in: House/apartment Stairs: Yes: Internal: 14 steps; can reach both and External: 3 steps; can reach both Has following equipment at home: None  OCCUPATION: N/A-exercises regularly.  PLOF: Independent  PATIENT GOALS: Patient would like to learn some exercises to decrease the pain in her back. Improve posture.  NEXT MD VISIT: None scheduled with Dr  Danielle Dess.  OBJECTIVE:   DIAGNOSTIC FINDINGS:  Per referring provider-X rays in Oct show pedicle screws and hardware in her spine with U rod connection from L5-T9. Apparent old fracture of the L sided rod that appears old and stable. Adjacent T spine appear stable.  SCREENING FOR RED FLAGS: Bowel or bladder incontinence: No Spinal tumors: No Cauda equina syndrome: No Compression fracture: No Abdominal aneurysm: No  COGNITION: Overall cognitive status: Within functional limits for tasks assessed     SENSATION: WFL  MUSCLE LENGTH: Hamstrings: B WNL, nearly 90 Thomas test: B hip flexor tightness noted, moderate  POSTURE: rounded shoulders, forward head, decreased lumbar lordosis, decreased thoracic kyphosis, anterior pelvic tilt, and rib hump on L thoracic spine  PALPATION: TTP B along gluts origin and piriformis, R > L.  LUMBAR ROM:   AROM eval  Flexion To feet  Extension 60%  Right lateral flexion To knee  Left lateral flexion Mid thigh  Right rotation WNL  Left rotation WNL   (Blank rows = not tested)  LOWER EXTREMITY ROM:   WFL except hip ext limited   LOWER EXTREMITY MMT:  B hips 4-(4-)/5, kneesand ankles 5/5  FUNCTIONAL TESTS:  5 times sit to stand: 14.8 Functional gait assessment: TBD  GAIT: Distance walked: In clinic distances Assistive device utilized: None Level of assistance: Complete Independence Comments: slightly flexed posture noted with excessive trunk sway.  TODAY'S TREATMENT:                                                                                                                              DATE:  02/07/23 Nustep level 5 x 6 minutes Elliptical 2 minutes with Supervision, checked HR and O2 after no issues Gait outside around the back building some cues for posture, one small rest break 7# farmer carry 1 lap each hand Calf stretches Feet on ball K2C, rotation, bridges, iso abs Passive stretch HS and piriformis Education on the  elliptical , farmer carry and the stretches  01/29/23 Walked around back building working on maintaining upright posture- hard going up hill to maintain. Mild SOB ( but  able to talk thru entire walk). Mod I step up but step down needs assist 8 min Resited gait 30# 5 x 4 ways, CGA STS with wt ball press 10x STS with wt ball OH 10 x Kettle bell Jimmye Norman carry 2 laps each had 6 inch step up with UE opp leg ext then laterally with opp leg abd  01/24/23 Reviewed Y ex- added row and trun flex resisted- pt goes 2-3 x a week and has great routine and HEPS - reviewed ball ex, stretches and tband ex ( LE and scap stab) Walked around back building working on maintaining upright posture- hard going up hill to maintain. Mild SOB. Mod I step up but step down needs assist STS with wt ball press 10 x  01/22/23 Nustep L 5 Upright row into trunk ext 10x 15# 10# cable pulley lateral flexion 15 x each side Cable pulley AR press 10# and rotation  ( decrease to 5#)10 x each side Lat pull and Seated Row 25# 2 sets 10 STS with wt ball press 10 x   01/17/23 Nustep L 5 Shoulder ext 5# 2x10 on airex then row 10# 2 sets 10 Black tband trunk ext and flex 2 sets 10- issued for HEP Lat pull 25# 2 sets 10 6# lateral trunk flex 2 sets 10 each side Framers carry 6# 2 laps each hand STS on airex 10 x with cues to come to full standing then 10x  with  wt ball chest press Cable pulley AR press and rotation 10 x each side Leg Press 30# 3 sets 10,calf raises 2 sets15  01/15/23 Nustep L 5 Lat Pull 20# 2 sets 10 Seated Row 20# 2 sets 10 Black tband trunk ext 2 sets 10 STS on airex with wt ball chest press 10 x Step up with opp leg ext 10 x , then lateral step up with opp leg abd 10x Shoulder ext 5# 2x10 on airex Leg Press 30# 3 sets 10,calf raises 2 sest 15  01/10/23 NuStep L5 x78mins  Resisted gait 20# 4 way x4  Shoulder ext 5# 2x10  Step ups 6" STS on airex 2x10  Walking on beam   01/08/23 NuStep L5  x17mins  Walking outdoors Seated rows and lats 15# 2x10 blackTB ext 2x10 Stretching HS, SKTC, DKTC, glutes, piriformis STS 2x10  Horizontal abd red 2x10    01/02/24 NuStep L4 x 7 min Shoulder Ext 5lb 2x10 Seated Rows & Lats 15lb 2x10 Bridges LE on Pball bridges, oblg, K2C Passive HS, piriformis, K2C, lower trunk rotations.  01/01/23  Education Supine-Figure 4 stretch, glut stretch, thomas stretch  PATIENT EDUCATION:  Education details: POC Person educated: Patient Education method: Explanation Education comprehension: verbalized understanding  HOME EXERCISE PROGRAM: [id number]-Program identified, but did not review with patient or provide written copy. Initiated figure 4 and gluts stretches, thomas stretch with demo and return demo.  ASSESSMENT:  CLINICAL IMPRESSION:  Added a large lap walking, elliptical, and farmer carry.  She was short of breath with the walking and needed cues for posture, needed supervision with the elliptical.  Cues for posture during the walk and farmers walk. She is tight in the LE's and c/o foot and ankle pain after the last visit doing calf raises  OBJECTIVE IMPAIRMENTS: Abnormal gait, decreased activity tolerance, decreased balance, decreased coordination, decreased endurance, difficulty walking, decreased ROM, decreased strength, increased muscle spasms, impaired flexibility, improper body mechanics, postural dysfunction, and pain.   ACTIVITY LIMITATIONS: carrying, lifting, bending, sitting, squatting,  and locomotion level  PARTICIPATION LIMITATIONS: meal prep, cleaning, shopping, and community activity  PERSONAL FACTORS: Age, Past/current experiences, and 1 comorbidity: Multi-level fusion years ago from T9-L5  are also affecting patient's functional outcome.   REHAB POTENTIAL: Good  CLINICAL DECISION MAKING: Stable/uncomplicated  EVALUATION COMPLEXITY: Low   GOALS: Goals reviewed with patient? Yes  SHORT TERM GOALS: Target date:  01/13/23  I with initial HEP Baseline: Goal status: 01/15/23 MET  LONG TERM GOALS: Target date: 02/26/23  I with final HEP Baseline:  Goal status:progressing 02/07/23  2.  Increase B hip strength to 4+/5 Baseline: 4-(4-)/5 Goal status: 01/24/23 progressing  3.  Patient will tolerate palpation of B gluts and piriformis without reports of pain, will also report no hip pain when performing her normal daily activities. Baseline: Fluctuating pain with activity as well as mod TTP. Goal status: MET 01/24/23  4.  Patient will demonstrate improved hip flexor mobility by being able to perform full [facility] test with the test limb lying flat on mat with knee flexion through 1/2 ROM. Baseline: Unable to achieve true [facility] test position due to pain and tightness. Goal status: progressing 01/24/23  5.  Patient will ambulate at least 300' on level and unlevel surfaces with no report of pain or unsteadiness, demonstrating upright posture throughout. Baseline: flexed posture, limited distances. Goal status: progressing 01/24/23  PLAN:  PT FREQUENCY: 1-2x/week  PT DURATION: 8 weeks  PLANNED INTERVENTIONS: Therapeutic exercises, Therapeutic activity, Neuromuscular re-education, Balance training, Gait training, Patient/Family education, Self Care, Joint mobilization, Stair training, Dry Needling, Electrical stimulation, Cryotherapy, Moist heat, Taping, Ionotophoresis 4mg /ml Dexamethasone, and Manual therapy.  PLAN FOR NEXT SESSION: progress postural and core strength. GAIT Encounter Date: 02/07/2023   Jearld Lesch, PT 02/07/2023, 10:59 AM

## 2023-02-12 ENCOUNTER — Ambulatory Visit: Payer: Medicare Other | Attending: Neurological Surgery | Admitting: Physical Therapy

## 2023-02-12 ENCOUNTER — Encounter: Payer: Self-pay | Admitting: Physical Therapy

## 2023-02-12 DIAGNOSIS — R293 Abnormal posture: Secondary | ICD-10-CM | POA: Insufficient documentation

## 2023-02-12 DIAGNOSIS — M25551 Pain in right hip: Secondary | ICD-10-CM | POA: Diagnosis not present

## 2023-02-12 DIAGNOSIS — M25552 Pain in left hip: Secondary | ICD-10-CM | POA: Diagnosis not present

## 2023-02-12 DIAGNOSIS — R262 Difficulty in walking, not elsewhere classified: Secondary | ICD-10-CM | POA: Insufficient documentation

## 2023-02-12 DIAGNOSIS — G8929 Other chronic pain: Secondary | ICD-10-CM | POA: Insufficient documentation

## 2023-02-12 DIAGNOSIS — M6281 Muscle weakness (generalized): Secondary | ICD-10-CM | POA: Insufficient documentation

## 2023-02-12 NOTE — Therapy (Signed)
OUTPATIENT PHYSICAL THERAPY THORACOLUMBAR TREATMENT       Patient Name: Kayla [name] MRN: [id number] DOB:1946/01/15, 77 y.o., female Today's Date: 02/12/2023  END OF SESSION:  PT End of Session - 02/12/23 1528     Visit Number 11    Date for PT Re-Evaluation 02/26/23    PT Start Time 1528    PT Stop Time 1600    PT Time Calculation (min) 32 min    Activity Tolerance Patient tolerated treatment well    Behavior During Therapy [facility] for tasks assessed/performed              Past Medical History:  Diagnosis Date   Abscess of left groin 11/10/2014   Allergy    Asthma    Cellulitis 10/20/2014   Diverticulitis    Diverticulosis    Fasting hyperglycemia 04/2011   FBS 108   Fibromyalgia    Fundic gland polyps of stomach, benign    GERD (gastroesophageal reflux disease)    gastric polyp x3   Glaucoma     Dr Hazle Quant   Hypertension    IBS (irritable bowel syndrome)    Osteopenia    last 07/2011   Pneumonia     OP as child   Shingles    Past Surgical History:  Procedure Laterality Date   COLONOSCOPY W/ POLYPECTOMY  1993,2002,2007   last  colonoscopy 2007, Dr Leone Payor   DILATION AND CURETTAGE OF UTERUS     ESOPHAGOGASTRODUODENOSCOPY     SPINE SURGERY     T9-L5 fusions   Patient Active Problem List   Diagnosis Date Noted   Bilateral low back pain with sciatica 06/12/2022   Weakness 06/12/2022   Pulmonary nodule less than 1 cm in diameter with low risk for malignant neoplasm 03/14/2021   Asthmatic bronchitis 03/09/2020   Allergies 01/18/2020   Preventative health care 03/29/2019   Pneumonia of left lower lobe due to infectious organism 09/01/2018   Vaginal atrophy 03/24/2018   Seasonal allergies 02/08/2018   Erythema nodosum 06/09/2016   Herpes zoster 09/23/2013   Superficial bruising of finger 02/16/2013   Hyperlipidemia 11/01/2011   Palpitations 05/23/2011   Essential hypertension 04/23/2011   Cough 04/09/2011   THYROID FUNCTION TEST, ABNORMAL  12/20/2009   COLONIC POLYPS, HX OF 11/08/2008   Asthma 01/21/2008   GERD 01/21/2008   IRRITABLE BOWEL SYNDROME 01/21/2008   FIBROMYALGIA 01/21/2008   Vitamin D deficiency 04/02/2007   DEGENERATIVE DISC DISEASE 04/02/2007   GLAUCOMA, LOW TENSION 02/06/2007   Osteoporosis 02/06/2007    PCP: Donato Schultz, DO  REFERRING PROVIDER: Barnett Abu, MD  REFERRING DIAG:  Diagnosis  Z87.39 (ICD-10-CM) - Personal history of other diseases of the musculoskeletal system and connective tissue  Postural exercise program for scoliosis emphasizing neutral and extension exercises.  Rationale for Evaluation and Treatment: Rehabilitation  THERAPY DIAG:  Muscle weakness (generalized)  Difficulty in walking, not elsewhere classified  Chronic hip pain, bilateral  Abnormal posture  ONSET DATE: 12/20/22  SUBJECTIVE:                                                                            SUBJECTIVE STATEMENT: Other than the spot on my back I feel fine  PERTINENT HISTORY:  Per referring provider S/P T9-L5 fusion years ago  PAIN:  Are you having pain? Yes: NPRS scale: 5/10 Pain location: low back B Pain description: soreness Aggravating factors: Unknown, fluctuates Relieving factors: Tylenol.  PRECAUTIONS: None  WEIGHT BEARING RESTRICTIONS: No  FALLS:  Has patient fallen in last 6 months? No  LIVING ENVIRONMENT: Lives with: lives with their family and lives alone Lives in: House/apartment Stairs: Yes: Internal: 14 steps; can reach both and External: 3 steps; can reach both Has following equipment at home: None  OCCUPATION: N/A-exercises regularly.  PLOF: Independent  PATIENT GOALS: Patient would like to learn some exercises to decrease the pain in her back. Improve posture.  NEXT MD VISIT: None scheduled with Dr Danielle Dess.  OBJECTIVE:   DIAGNOSTIC FINDINGS:  Per referring provider-X rays in Oct show pedicle screws and hardware in her spine with U rod connection from  L5-T9. Apparent old fracture of the L sided rod that appears old and stable. Adjacent T spine appear stable.  SCREENING FOR RED FLAGS: Bowel or bladder incontinence: No Spinal tumors: No Cauda equina syndrome: No Compression fracture: No Abdominal aneurysm: No  COGNITION: Overall cognitive status: Within functional limits for tasks assessed     SENSATION: WFL  MUSCLE LENGTH: Hamstrings: B WNL, nearly 90 Thomas test: B hip flexor tightness noted, moderate  POSTURE: rounded shoulders, forward head, decreased lumbar lordosis, decreased thoracic kyphosis, anterior pelvic tilt, and rib hump on L thoracic spine  PALPATION: TTP B along gluts origin and piriformis, R > L.  LUMBAR ROM:   AROM eval  Flexion To feet  Extension 60%  Right lateral flexion To knee  Left lateral flexion Mid thigh  Right rotation WNL  Left rotation WNL   (Blank rows = not tested)  LOWER EXTREMITY ROM:   WFL except hip ext limited   LOWER EXTREMITY MMT:  B hips 4-(4-)/5, kneesand ankles 5/5  FUNCTIONAL TESTS:  5 times sit to stand: 14.8 Functional gait assessment: TBD  GAIT: Distance walked: In clinic distances Assistive device utilized: None Level of assistance: Complete Independence Comments: slightly flexed posture noted with excessive trunk sway.  TODAY'S TREATMENT:                                                                                                                              DATE:  02/12/23 Nustep level 5 x 6 minutes Shoulder ER red 2x10 S2S yellow ball chest ptess 2x10  Shoulder Ext 5lb x 10  DN to R rhomdoid area   02/07/23 Nustep level 5 x 6 minutes Elliptical 2 minutes with Supervision, checked HR and O2 after no issues Gait outside around the back building some cues for posture, one small rest break 7# farmer carry 1 lap each hand Calf stretches Feet on ball K2C, rotation, bridges, iso abs Passive stretch HS and piriformis Education on the elliptical , farmer  carry and the stretches  01/29/23 Walked around back building working  on maintaining upright posture- hard going up hill to maintain. Mild SOB ( but able to talk thru entire walk). Mod I step up but step down needs assist 8 min Resited gait 30# 5 x 4 ways, CGA STS with wt ball press 10x STS with wt ball OH 10 x Kettle bell Jimmye Norman carry 2 laps each had 6 inch step up with UE opp leg ext then laterally with opp leg abd  01/24/23 Reviewed Y ex- added row and trun flex resisted- pt goes 2-3 x a week and has great routine and HEPS - reviewed ball ex, stretches and tband ex ( LE and scap stab) Walked around back building working on maintaining upright posture- hard going up hill to maintain. Mild SOB. Mod I step up but step down needs assist STS with wt ball press 10 x  01/22/23 Nustep L 5 Upright row into trunk ext 10x 15# 10# cable pulley lateral flexion 15 x each side Cable pulley AR press 10# and rotation  ( decrease to 5#)10 x each side Lat pull and Seated Row 25# 2 sets 10 STS with wt ball press 10 x   01/17/23 Nustep L 5 Shoulder ext 5# 2x10 on airex then row 10# 2 sets 10 Black tband trunk ext and flex 2 sets 10- issued for HEP Lat pull 25# 2 sets 10 6# lateral trunk flex 2 sets 10 each side Framers carry 6# 2 laps each hand STS on airex 10 x with cues to come to full standing then 10x  with  wt ball chest press Cable pulley AR press and rotation 10 x each side Leg Press 30# 3 sets 10,calf raises 2 sets15  01/15/23 Nustep L 5 Lat Pull 20# 2 sets 10 Seated Row 20# 2 sets 10 Black tband trunk ext 2 sets 10 STS on airex with wt ball chest press 10 x Step up with opp leg ext 10 x , then lateral step up with opp leg abd 10x Shoulder ext 5# 2x10 on airex Leg Press 30# 3 sets 10,calf raises 2 sest 15  01/10/23 NuStep L5 x67mins  Resisted gait 20# 4 way x4  Shoulder ext 5# 2x10  Step ups 6" STS on airex 2x10  Walking on beam   01/08/23 NuStep L5 x9mins  Walking  outdoors Seated rows and lats 15# 2x10 blackTB ext 2x10 Stretching HS, SKTC, DKTC, glutes, piriformis STS 2x10  Horizontal abd red 2x10    01/02/24 NuStep L4 x 7 min Shoulder Ext 5lb 2x10 Seated Rows & Lats 15lb 2x10 Bridges LE on Pball bridges, oblg, K2C Passive HS, piriformis, K2C, lower trunk rotations.  01/01/23  Education Supine-Figure 4 stretch, glut stretch, thomas stretch  PATIENT EDUCATION:  Education details: POC Person educated: Patient Education method: Explanation Education comprehension: verbalized understanding  HOME EXERCISE PROGRAM: [id number] identified, but did not review with patient or provide written copy. Initiated figure 4 and gluts stretches, thomas stretch with demo and return demo.  ASSESSMENT:  CLINICAL IMPRESSION:  Pt ~ 12 minutes late for today's session. She enters with pain in the R rhomboid area. Postural cues needed with standing shoulder extensions. Tactiel cue to keep elbows to side with external rotation. Lead PT assisted in session providing pt with DN.   OBJECTIVE IMPAIRMENTS: Abnormal gait, decreased activity tolerance, decreased balance, decreased coordination, decreased endurance, difficulty walking, decreased ROM, decreased strength, increased muscle spasms, impaired flexibility, improper body mechanics, postural dysfunction, and pain.   ACTIVITY LIMITATIONS: carrying, lifting, bending, sitting,  squatting, and locomotion level  PARTICIPATION LIMITATIONS: meal prep, cleaning, shopping, and community activity  PERSONAL FACTORS: Age, Past/current experiences, and 1 comorbidity: Multi-level fusion years ago from T9-L5  are also affecting patient's functional outcome.   REHAB POTENTIAL: Good  CLINICAL DECISION MAKING: Stable/uncomplicated  EVALUATION COMPLEXITY: Low   GOALS: Goals reviewed with patient? Yes  SHORT TERM GOALS: Target date: 01/13/23  I with initial HEP Baseline: Goal status: 01/15/23 MET  LONG TERM GOALS:  Target date: 02/26/23  I with final HEP Baseline:  Goal status:progressing 02/07/23  2.  Increase B hip strength to 4+/5 Baseline: 4-(4-)/5 Goal status: 01/24/23 progressing  3.  Patient will tolerate palpation of B gluts and piriformis without reports of pain, will also report no hip pain when performing her normal daily activities. Baseline: Fluctuating pain with activity as well as mod TTP. Goal status: MET 01/24/23  4.  Patient will demonstrate improved hip flexor mobility by being able to perform full [facility] test with the test limb lying flat on mat with knee flexion through 1/2 ROM. Baseline: Unable to achieve true [facility] test position due to pain and tightness. Goal status: progressing 01/24/23  5.  Patient will ambulate at least 300' on level and unlevel surfaces with no report of pain or unsteadiness, demonstrating upright posture throughout. Baseline: flexed posture, limited distances. Goal status: progressing 01/24/23  PLAN:  PT FREQUENCY: 1-2x/week  PT DURATION: 8 weeks  PLANNED INTERVENTIONS: Therapeutic exercises, Therapeutic activity, Neuromuscular re-education, Balance training, Gait training, Patient/Family education, Self Care, Joint mobilization, Stair training, Dry Needling, Electrical stimulation, Cryotherapy, Moist heat, Taping, Ionotophoresis 4mg /ml Dexamethasone, and Manual therapy.  PLAN FOR NEXT SESSION: progress postural and core strength. GAIT Encounter Date: 02/12/2023   Grayce Sessions, PTA 02/12/2023, 3:29 PM

## 2023-02-14 ENCOUNTER — Ambulatory Visit (INDEPENDENT_AMBULATORY_CARE_PROVIDER_SITE_OTHER): Payer: Medicare Other | Admitting: *Deleted

## 2023-02-14 VITALS — BP 136/84 | HR 56 | Ht 60.0 in | Wt 147.8 lb

## 2023-02-14 DIAGNOSIS — Z0000 Encounter for general adult medical examination without abnormal findings: Secondary | ICD-10-CM

## 2023-02-14 NOTE — Patient Instructions (Signed)
Ms. Lienhart , Thank you for taking time to come for your Medicare Wellness Visit. I appreciate your ongoing commitment to your health goals. Please review the following plan we discussed and let me know if I can assist you in the future.     This is a list of the screening recommended for you and due dates:  Health Maintenance  Topic Date Due   COVID-19 Vaccine (4 - 2023-24 season) 05/11/2022   Flu Shot  04/11/2023   Mammogram  07/18/2023   Medicare Annual Wellness Visit  02/14/2024   DTaP/Tdap/Td vaccine (3 - Td or Tdap) 02/16/2025   Pneumonia Vaccine  Completed   DEXA scan (bone density measurement)  Completed   Hepatitis C Screening  Completed   Zoster (Shingles) Vaccine  Completed   HPV Vaccine  Aged Out   Colon Cancer Screening  Discontinued    Next appointment: Follow up in one year for your annual wellness visit.   Preventive Care 21 Years and Older, Female Preventive care refers to lifestyle choices and visits with your health care provider that can promote health and wellness. What does preventive care include? A yearly physical exam. This is also called an annual well check. Dental exams once or twice a year. Routine eye exams. Ask your health care provider how often you should have your eyes checked. Personal lifestyle choices, including: Daily care of your teeth and gums. Regular physical activity. Eating a healthy diet. Avoiding tobacco and drug use. Limiting alcohol use. Practicing safe sex. Taking low-dose aspirin every day. Taking vitamin and mineral supplements as recommended by your health care provider. What happens during an annual well check? The services and screenings done by your health care provider during your annual well check will depend on your age, overall health, lifestyle risk factors, and family history of disease. Counseling  Your health care provider may ask you questions about your: Alcohol use. Tobacco use. Drug use. Emotional  well-being. Home and relationship well-being. Sexual activity. Eating habits. History of falls. Memory and ability to understand (cognition). Work and work Astronomer. Reproductive health. Screening  You may have the following tests or measurements: Height, weight, and BMI. Blood pressure. Lipid and cholesterol levels. These may be checked every 5 years, or more frequently if you are over 12 years old. Skin check. Lung cancer screening. You may have this screening every year starting at age 53 if you have a 30-pack-year history of smoking and currently smoke or have quit within the past 15 years. Fecal occult blood test (FOBT) of the stool. You may have this test every year starting at age 34. Flexible sigmoidoscopy or colonoscopy. You may have a sigmoidoscopy every 5 years or a colonoscopy every 10 years starting at age 75. Hepatitis C blood test. Hepatitis B blood test. Sexually transmitted disease (STD) testing. Diabetes screening. This is done by checking your blood sugar (glucose) after you have not eaten for a while (fasting). You may have this done every 1-3 years. Bone density scan. This is done to screen for osteoporosis. You may have this done starting at age 75. Mammogram. This may be done every 1-2 years. Talk to your health care provider about how often you should have regular mammograms. Talk with your health care provider about your test results, treatment options, and if necessary, the need for more tests. Vaccines  Your health care provider may recommend certain vaccines, such as: Influenza vaccine. This is recommended every year. Tetanus, diphtheria, and acellular pertussis (Tdap, Td) vaccine.  You may need a Td booster every 10 years. Zoster vaccine. You may need this after age 44. Pneumococcal 13-valent conjugate (PCV13) vaccine. One dose is recommended after age 49. Pneumococcal polysaccharide (PPSV23) vaccine. One dose is recommended after age 37. Talk to your  health care provider about which screenings and vaccines you need and how often you need them. This information is not intended to replace advice given to you by your health care provider. Make sure you discuss any questions you have with your health care provider. Document Released: 09/23/2015 Document Revised: 05/16/2016 Document Reviewed: 06/28/2015 Elsevier Interactive Patient Education  2017 [employer].  Fall Prevention in the Home Falls can cause injuries. They can happen to people of all ages. There are many things you can do to make your home safe and to help prevent falls. What can I do on the outside of my home? Regularly fix the edges of walkways and driveways and fix any cracks. Remove anything that might make you trip as you walk through a door, such as a raised step or threshold. Trim any bushes or trees on the path to your home. Use bright outdoor lighting. Clear any walking paths of anything that might make someone trip, such as rocks or tools. Regularly check to see if handrails are loose or broken. Make sure that both sides of any steps have handrails. Any raised decks and porches should have guardrails on the edges. Have any leaves, snow, or ice cleared regularly. Use sand or salt on walking paths during winter. Clean up any spills in your garage right away. This includes oil or grease spills. What can I do in the bathroom? Use night lights. Install grab bars by the toilet and in the tub and shower. Do not use towel bars as grab bars. Use non-skid mats or decals in the tub or shower. If you need to sit down in the shower, use a plastic, non-slip stool. Keep the floor dry. Clean up any water that spills on the floor as soon as it happens. Remove soap buildup in the tub or shower regularly. Attach bath mats securely with double-sided non-slip rug tape. Do not have throw rugs and other things on the floor that can make you trip. What can I do in the bedroom? Use night  lights. Make sure that you have a light by your bed that is easy to reach. Do not use any sheets or blankets that are too big for your bed. They should not hang down onto the floor. Have a firm chair that has side arms. You can use this for support while you get dressed. Do not have throw rugs and other things on the floor that can make you trip. What can I do in the kitchen? Clean up any spills right away. Avoid walking on wet floors. Keep items that you use a lot in easy-to-reach places. If you need to reach something above you, use a strong step stool that has a grab bar. Keep electrical cords out of the way. Do not use floor polish or wax that makes floors slippery. If you must use wax, use non-skid floor wax. Do not have throw rugs and other things on the floor that can make you trip. What can I do with my stairs? Do not leave any items on the stairs. Make sure that there are handrails on both sides of the stairs and use them. Fix handrails that are broken or loose. Make sure that handrails are as long as  the stairways. Check any carpeting to make sure that it is firmly attached to the stairs. Fix any carpet that is loose or worn. Avoid having throw rugs at the top or bottom of the stairs. If you do have throw rugs, attach them to the floor with carpet tape. Make sure that you have a light switch at the top of the stairs and the bottom of the stairs. If you do not have them, ask someone to add them for you. What else can I do to help prevent falls? Wear shoes that: Do not have high heels. Have rubber bottoms. Are comfortable and fit you well. Are closed at the toe. Do not wear sandals. If you use a stepladder: Make sure that it is fully opened. Do not climb a closed stepladder. Make sure that both sides of the stepladder are locked into place. Ask someone to hold it for you, if possible. Clearly mark and make sure that you can see: Any grab bars or handrails. First and last  steps. Where the edge of each step is. Use tools that help you move around (mobility aids) if they are needed. These include: Canes. Walkers. Scooters. Crutches. Turn on the lights when you go into a dark area. Replace any light bulbs as soon as they burn out. Set up your furniture so you have a clear path. Avoid moving your furniture around. If any of your floors are uneven, fix them. If there are any pets around you, be aware of where they are. Review your medicines with your doctor. Some medicines can make you feel dizzy. This can increase your chance of falling. Ask your doctor what other things that you can do to help prevent falls. This information is not intended to replace advice given to you by your health care provider. Make sure you discuss any questions you have with your health care provider. Document Released: 06/23/2009 Document Revised: 02/02/2016 Document Reviewed: 10/01/2014 Elsevier Interactive Patient Education  2017 [employer].

## 2023-02-14 NOTE — Progress Notes (Signed)
Subjective:   Kayla [name] is a 78 y.o. female who presents for Medicare Annual (Subsequent) preventive examination.  Review of Systems     Cardiac Risk Factors include: advanced age (>53men, >43 women);dyslipidemia;hypertension     Objective:    Today's Vitals   02/14/23 1303  BP: 136/84  Pulse: (!) 56  Weight: 147 lb 12.8 oz (67 kg)  Height: 5' (1.524 m)   Body mass index is 28.87 kg/m.     02/14/2023    1:28 PM 06/09/2022    7:00 PM 02/06/2022    4:04 PM 01/13/2022   11:46 AM 02/02/2021   12:47 PM 08/02/2020    6:22 PM 03/05/2020    8:03 PM  Advanced Directives  Does Patient Have a Medical Advance Directive? Yes No Yes Yes Yes No Yes  Type of Estate agent of [place];Living will  Healthcare Power of [place];Out of facility DNR (pink MOST or yellow form);Living will  Healthcare Power of [place];Living will    Does patient want to make changes to medical advance directive?   No - Patient declined      Copy of Healthcare Power of Attorney in Chart? No - copy requested  No - copy requested  No - copy requested      Current Medications (verified) Outpatient Encounter Medications as of 02/14/2023  Medication Sig   acetaminophen (TYLENOL) 500 MG tablet Take 500 mg by mouth as needed.   albuterol (PROVENTIL) (2.5 MG/3ML) 0.083% nebulizer solution Take 3 mLs (2.5 mg total) by nebulization every 6 (six) hours as needed for wheezing or shortness of breath.   albuterol (VENTOLIN HFA) 108 (90 Base) MCG/ACT inhaler Inhale 2 puffs into the lungs every 6 (six) hours as needed for wheezing or shortness of breath.   amLODipine (NORVASC) 2.5 MG tablet Take 1 tablet (2.5 mg total) by mouth daily.   bifidobacterium infantis (ALIGN) capsule Take 1 capsule by mouth daily.   carboxymethylcellulose (REFRESH PLUS) 0.5 % SOLN 1 drop 3 (three) times daily as needed.   cholecalciferol (VITAMIN D) 1000 units tablet Take 2,000 Units by mouth daily.   EPINEPHrine (EPIPEN 2-PAK)  0.3 mg/0.3 mL IJ SOAJ injection As directed   Esomeprazole Magnesium (NEXIUM 24HR PO) Take 1 capsule by mouth as needed.   famotidine (PEPCID) 20 MG tablet Take 20 mg by mouth daily.   Flaxseed, Linseed, (GROUND FLAX SEEDS PO)    hyoscyamine (LEVSIN SL) 0.125 MG SL tablet Place 1 tablet (0.125 mg total) under the tongue every 6 (six) hours as needed.   latanoprost (XALATAN) 0.005 % ophthalmic solution Place 1 drop into both eyes at bedtime.   metoprolol tartrate (LOPRESSOR) 25 MG tablet 1 po bid   montelukast (SINGULAIR) 10 MG tablet TAKE 1 TABLET(10 MG) BY MOUTH AT BEDTIME   nystatin cream (MYCOSTATIN) APPLY TOPICALLY TO THE AFFECTED AREA TWICE DAILY   Polyethyl Glycol-Propyl Glycol (SYSTANE) 0.4-0.3 % GEL ophthalmic gel Place 1 application into both eyes.   sodium chloride (OCEAN) 0.65 % SOLN nasal spray Place 1 spray into both nostrils as needed for congestion.   triamcinolone (KENALOG) 0.1 % Apply 1 application topically 2 (two) times daily.   Vitamin Mixture (ESTER-C PO) Take 1,000 mg by mouth daily.   No facility-administered encounter medications on file as of 02/14/2023.    Allergies (verified) Cephalosporins, Gabapentin, Levofloxacin, Psyllium, Sulfonamide derivatives, Aspirin, Belladonna, Ciprofloxacin hcl, Conjugated estrogens, Cymbopogon, Doxycycline, Flovent hfa [fluticasone], Nabumetone, Nsaids, Soybean-containing drug products, Zicam cold remedy [homeopathic products], Zocor [simvastatin],  Nickel, Paba derivatives, and Prednisone   History: Past Medical History:  Diagnosis Date   Abscess of left groin 11/10/2014   Allergy    Arthritis    Asthma    Cataract 2015   Very mild   Cellulitis 10/20/2014   Diverticulitis    Diverticulosis    Fasting hyperglycemia 04/2011   FBS 108   Fibromyalgia    Fundic gland polyps of stomach, benign    GERD (gastroesophageal reflux disease)    gastric polyp x3   Glaucoma     Dr Hazle Quant   Hypertension    IBS (irritable bowel syndrome)     Osteopenia    last 07/2011   Pneumonia     OP as child   Shingles    Past Surgical History:  Procedure Laterality Date   COLONOSCOPY W/ POLYPECTOMY  1993,2002,2007   last  colonoscopy 2007, Dr Leone Payor   DILATION AND CURETTAGE OF UTERUS     ESOPHAGOGASTRODUODENOSCOPY     SPINE SURGERY     T9-L5 fusions   Family History  Problem Relation Age of Onset   Breast cancer Mother    Heart failure Father        CHF, CABG late 33s   Breast cancer Sister    Breast cancer Maternal Aunt        two   Heart disease Maternal Aunt    Breast cancer Paternal Aunt    Asthma Paternal Aunt    Coronary artery disease Paternal Aunt        triple CABG   Stomach cancer Paternal Uncle    Heart attack Paternal Uncle         MI in 23s   Kidney disease Maternal Grandmother    Heart attack Paternal Grandmother        MI in late 60s   Heart disease Paternal Grandfather    Pulmonary embolism Son    Deep vein thrombosis Son    Heart Problems Son        Heart attacks related to a "blood disorder"    Ovarian cancer Other        Niece x2   Breast cancer Other        niece   Diabetes Neg Hx    Stroke Neg Hx    COPD Neg Hx    Colon polyps Neg Hx    Colon cancer Neg Hx    Esophageal cancer Neg Hx    Rectal cancer Neg Hx    Social History   Socioeconomic History   Marital status: Widowed    Spouse name: Not on file   Number of children: Not on file   Years of education: Not on file   Highest education level: Some college, no degree  Occupational History   Occupation: retired  Tobacco Use   Smoking status: Former    Packs/day: 0.50    Years: 10.00    Additional pack years: 0.00    Total pack years: 5.00    Types: Cigarettes    Quit date: 09/10/1974    Years since quitting: 48.4   Smokeless tobacco: Never   Tobacco comments:    smoked 1966- 1976, up to 1 ppd  Vaping Use   Vaping Use: Never used  Substance and Sexual Activity   Alcohol use: No    Alcohol/week: 0.0 standard drinks of  alcohol   Drug use: No   Sexual activity: Not Currently  Other Topics Concern   Not on file  Social  History Narrative   Daily caffeine 3 cups   Regular exercise   Married         Social Determinants of Health   Financial Resource Strain: Low Risk  (12/21/2022)   Overall Financial Resource Strain (CARDIA)    Difficulty of Paying Living Expenses: Not very hard  Food Insecurity: No Food Insecurity (12/21/2022)   Hunger Vital Sign    Worried About Running Out of Food in the Last Year: Never true    Ran Out of Food in the Last Year: Never true  Transportation Needs: No Transportation Needs (12/21/2022)   PRAPARE - Administrator, Civil Service (Medical): No    Lack of Transportation (Non-Medical): No  Physical Activity: Insufficiently Active (12/21/2022)   Exercise Vital Sign    Days of Exercise per Week: 3 days    Minutes of Exercise per Session: 40 min  Stress: No Stress Concern Present (12/21/2022)   [employer] of Occupational Health - Occupational Stress Questionnaire    Feeling of Stress : Not at all  Social Connections: Moderately Integrated (12/21/2022)   Social Connection and Isolation Panel [NHANES]    Frequency of Communication with Friends and Family: More than three times a week    Frequency of Social Gatherings with Friends and Family: Once a week    Attends Religious Services: More than 4 times per year    Active Member of [employer] or Organizations: Yes    Attends Banker Meetings: More than 4 times per year    Marital Status: Widowed    Tobacco Counseling Counseling given: Not Answered Tobacco comments: smoked 1966- 1976, up to 1 ppd   Clinical Intake:  Pre-visit preparation completed: Yes  Pain : No/denies pain  BMI - recorded: 28.87 Nutritional Status: BMI 25 -29 Overweight Nutritional Risks: None Diabetes: No  How often do you need to have someone help you when you read instructions, pamphlets, or other written materials  from your doctor or pharmacy?: 1 - Never   Activities of Daily Living    02/14/2023    1:11 PM  In your present state of health, do you have any difficulty performing the following activities:  Hearing? 0  Vision? 0  Difficulty concentrating or making decisions? 0  Walking or climbing stairs? 0  Dressing or bathing? 0  Doing errands, shopping? 0  Preparing Food and eating ? N  Using the Toilet? N  In the past six months, have you accidently leaked urine? Y  Comment stress incontinence very rarely  Do you have problems with loss of bowel control? N  Managing your Medications? N  Managing your Finances? N  Housekeeping or managing your Housekeeping? N    Patient Care Team: Zola Button, Grayling Congress, DO as PCP - General (Family Medicine) Iva Boop, MD as Consulting Physician (Gastroenterology) Nelson Chimes, MD as Consulting Physician (Ophthalmology) Beshears, Marrian Salvage, DMD (Dentistry) Rollene Rotunda, MD as Consulting Physician (Cardiology) Serena Colonel, MD as Consulting Physician (Otolaryngology) Shea Evans, Ovidio Hanger., DDS as Consulting Physician (Oral Surgery) Zenovia Jordan, MD as Consulting Physician (Rheumatology) Carlus Pavlov, MD as Consulting Physician (Endocrinology) Bettey Costa, MD as Consulting Physician (Dermatology)  Indicate any recent Medical Services you may have received from other than [facility] providers in the past year (date may be approximate).     Assessment:   This is a routine wellness examination for Kayla [name].  Hearing/Vision screen No results found.  Dietary issues and exercise activities discussed: Current Exercise Habits: Home  exercise routine, Type of exercise: strength training/weights;walking, Time (Minutes): 45, Frequency (Times/Week): 3, Weekly Exercise (Minutes/Week): 135, Intensity: Mild, Exercise limited by: None identified   Goals Addressed   None    Depression Screen    02/14/2023    1:10 PM 02/06/2022    4:44 PM 02/06/2022     4:43 PM 02/06/2022    4:20 PM 02/02/2021   12:54 PM 01/05/2020   11:19 AM 01/05/2019    2:13 PM  PHQ 2/9 Scores  PHQ - 2 Score 0 0 0 0 0 0 0    Fall Risk    02/14/2023    1:07 PM 02/06/2022    4:05 PM 02/02/2021   12:53 PM 01/05/2020   11:18 AM 01/05/2019    2:13 PM  Fall Risk   Falls in the past year? 0 0 0 0 0  Number falls in past yr: 0 0 0 0   Injury with Fall? 0 0 0 0   Risk for fall due to : No Fall Risks No Fall Risks     Follow up Falls evaluation completed Falls evaluation completed Falls prevention discussed Education provided;Falls prevention discussed     FALL RISK PREVENTION PERTAINING TO THE HOME:  Any stairs in or around the home? Yes  If so, are there any without handrails? No  Home free of loose throw rugs in walkways, pet beds, electrical cords, etc? Yes  Adequate lighting in your home to reduce risk of falls? Yes   ASSISTIVE DEVICES UTILIZED TO PREVENT FALLS:  Life alert? No  Use of a cane, walker or w/c? No  Grab bars in the bathroom? Yes  Shower chair or bench in shower? No  Elevated toilet seat or a handicapped toilet? No   TIMED UP AND GO:  Was the test performed? Yes .  Length of time to ambulate 10 feet: 6 sec.   Gait steady and fast without use of assistive device  Cognitive Function:    02/14/2023    1:36 PM 07/17/2017   11:31 AM  MMSE - Mini Mental State Exam  Not completed: Unable to complete   Orientation to time  5  Orientation to Place  5  Registration  3  Attention/ Calculation  5  Recall  3  Language- name 2 objects  2  Language- repeat  1  Language- follow 3 step command  3  Language- read & follow direction  1  Write a sentence  1  Copy design  1  Total score  30        02/06/2022    4:35 PM  6CIT Screen  What Year? 0 points  What month? 0 points  What time? 0 points  Count back from 20 0 points  Months in reverse 0 points  Repeat phrase 0 points  Total Score 0 points    Immunizations Immunization History   Administered Date(s) Administered   Fluad Quad(high Dose 65+) 06/12/2022   Influenza Whole 06/14/2008, 06/09/2010, 07/06/2011   Influenza, High Dose Seasonal PF 06/10/2014, 05/26/2015, 05/24/2016, 07/02/2017, 05/26/2019, 06/08/2021   Influenza, Seasonal, Injecte, Preservative Fre 05/26/2013   Influenza-Unspecified 05/30/2012, 05/22/2013, 05/15/2018, 05/26/2019, 06/07/2020   PFIZER Comirnaty(Gray Top)Covid-19 Tri-Sucrose Vaccine 10/11/2020   PFIZER(Purple Top)SARS-COV-2 Vaccination 11/05/2019, 11/25/2019   Pneumococcal Conjugate-13 01/11/2014   Pneumococcal Polysaccharide-23 07/12/2011, 04/24/2019   Td 03/07/2006   Tdap 02/17/2015   Zoster Recombinat (Shingrix) 12/08/2017, 03/21/2018   Zoster, Live 08/19/2013    TDAP status: Up to date  Flu Vaccine status:  Up to date  Pneumococcal vaccine status: Up to date  Covid-19 vaccine status: Information provided on how to obtain vaccines.   Qualifies for Shingles Vaccine? Yes   Zostavax completed Yes   Shingrix Completed?: Yes  Screening Tests Health Maintenance  Topic Date Due   COVID-19 Vaccine (4 - 2023-24 season) 05/11/2022   Medicare Annual Wellness (AWV)  02/07/2023   INFLUENZA VACCINE  04/11/2023   MAMMOGRAM  07/18/2023   DTaP/Tdap/Td (3 - Td or Tdap) 02/16/2025   Pneumonia Vaccine 42+ Years old  Completed   DEXA SCAN  Completed   Hepatitis C Screening  Completed   Zoster Vaccines- Shingrix  Completed   HPV VACCINES  Aged Out   Colonoscopy  Discontinued    Health Maintenance  Health Maintenance Due  Topic Date Due   COVID-19 Vaccine (4 - 2023-24 season) 05/11/2022   Medicare Annual Wellness (AWV)  02/07/2023    Colorectal cancer screening: Type of screening: Colonoscopy. Completed 03/20/22. Repeat every N/a years  Mammogram status: Completed 07/17/22. Repeat every year  Bone Density status: Completed 07/07/21. Results reflect: Bone density results: OSTEOPENIA. Repeat every 2 years.  Lung Cancer Screening: (Low  Dose CT Chest recommended if Age 58-80 years, 30 pack-year currently smoking OR have quit w/in 15years.) does not qualify.   Additional Screening:  Hepatitis C Screening: does qualify; Completed 06/03/15  Vision Screening: Recommended annual ophthalmology exams for early detection of glaucoma and other disorders of the eye. Is the patient up to date with their annual eye exam?  Yes  Who is the provider or what is the name of the office in which the patient attends annual eye exams? [facility]. If pt is not established with a provider, would they like to be referred to a provider to establish care? No .   Dental Screening: Recommended annual dental exams for proper oral hygiene  Community Resource Referral / Chronic Care Management: CRR required this visit?  No   CCM required this visit?  No      Plan:     I have personally reviewed and noted the following in the patient's chart:   Medical and social history Use of alcohol, tobacco or illicit drugs  Current medications and supplements including opioid prescriptions. Patient is not currently taking opioid prescriptions. Functional ability and status Nutritional status Physical activity Advanced directives List of other physicians Hospitalizations, surgeries, and ER visits in previous 12 months Vitals Screenings to include cognitive, depression, and falls Referrals and appointments  In addition, I have reviewed and discussed with patient certain preventive protocols, quality metrics, and best practice recommendations. A written personalized care plan for preventive services as well as general preventive health recommendations were provided to patient.     Donne Anon, [place]   02/14/2023   Nurse Notes: None

## 2023-02-15 ENCOUNTER — Encounter: Payer: Self-pay | Admitting: Physical Therapy

## 2023-02-15 ENCOUNTER — Ambulatory Visit: Payer: Medicare Other | Admitting: Physical Therapy

## 2023-02-15 DIAGNOSIS — M25552 Pain in left hip: Secondary | ICD-10-CM | POA: Diagnosis not present

## 2023-02-15 DIAGNOSIS — M25551 Pain in right hip: Secondary | ICD-10-CM | POA: Diagnosis not present

## 2023-02-15 DIAGNOSIS — M6281 Muscle weakness (generalized): Secondary | ICD-10-CM

## 2023-02-15 DIAGNOSIS — R293 Abnormal posture: Secondary | ICD-10-CM | POA: Diagnosis not present

## 2023-02-15 DIAGNOSIS — R262 Difficulty in walking, not elsewhere classified: Secondary | ICD-10-CM

## 2023-02-15 DIAGNOSIS — G8929 Other chronic pain: Secondary | ICD-10-CM

## 2023-02-15 NOTE — Therapy (Signed)
OUTPATIENT PHYSICAL THERAPY THORACOLUMBAR TREATMENT       Patient Name: Kayla [name] MRN: [id number] DOB:05-Apr-1946, 77 y.o., female Today's Date: 02/15/2023  END OF SESSION:  PT End of Session - 02/15/23 1101     Visit Number 12    Date for PT Re-Evaluation 02/26/23    PT Start Time 1101    PT Stop Time 1145    PT Time Calculation (min) 44 min    Activity Tolerance Patient tolerated treatment well    Behavior During Therapy [facility] for tasks assessed/performed              Past Medical History:  Diagnosis Date   Abscess of left groin 11/10/2014   Allergy    Arthritis    Asthma    Cataract 2015   Very mild   Cellulitis 10/20/2014   Diverticulitis    Diverticulosis    Fasting hyperglycemia 04/2011   FBS 108   Fibromyalgia    Fundic gland polyps of stomach, benign    GERD (gastroesophageal reflux disease)    gastric polyp x3   Glaucoma     Dr Hazle Quant   Hypertension    IBS (irritable bowel syndrome)    Osteopenia    last 07/2011   Pneumonia     OP as child   Shingles    Past Surgical History:  Procedure Laterality Date   COLONOSCOPY W/ POLYPECTOMY  1993,2002,2007   last  colonoscopy 2007, Dr Leone Payor   DILATION AND CURETTAGE OF UTERUS     ESOPHAGOGASTRODUODENOSCOPY     SPINE SURGERY     T9-L5 fusions   Patient Active Problem List   Diagnosis Date Noted   Bilateral low back pain with sciatica 06/12/2022   Weakness 06/12/2022   Pulmonary nodule less than 1 cm in diameter with low risk for malignant neoplasm 03/14/2021   Asthmatic bronchitis 03/09/2020   Allergies 01/18/2020   Preventative health care 03/29/2019   Pneumonia of left lower lobe due to infectious organism 09/01/2018   Vaginal atrophy 03/24/2018   Seasonal allergies 02/08/2018   Erythema nodosum 06/09/2016   Herpes zoster 09/23/2013   Superficial bruising of finger 02/16/2013   Hyperlipidemia 11/01/2011   Palpitations 05/23/2011   Essential hypertension 04/23/2011   Cough 04/09/2011    THYROID FUNCTION TEST, ABNORMAL 12/20/2009   COLONIC POLYPS, HX OF 11/08/2008   Asthma 01/21/2008   GERD 01/21/2008   IRRITABLE BOWEL SYNDROME 01/21/2008   FIBROMYALGIA 01/21/2008   Vitamin D deficiency 04/02/2007   DEGENERATIVE DISC DISEASE 04/02/2007   GLAUCOMA, LOW TENSION 02/06/2007   Osteoporosis 02/06/2007    PCP: Donato Schultz, DO  REFERRING PROVIDER: Barnett Abu, MD  REFERRING DIAG:  Diagnosis  Z87.39 (ICD-10-CM) - Personal history of other diseases of the musculoskeletal system and connective tissue  Postural exercise program for scoliosis emphasizing neutral and extension exercises.  Rationale for Evaluation and Treatment: Rehabilitation  THERAPY DIAG:  Muscle weakness (generalized)  Chronic hip pain, bilateral  Difficulty in walking, not elsewhere classified  Abnormal posture  ONSET DATE: 12/20/22  SUBJECTIVE:  SUBJECTIVE STATEMENT: Back is a lot better, not completely gone.   PERTINENT HISTORY:  Per referring provider S/P T9-L5 fusion years ago  PAIN:  Are you having pain? Yes: NPRS scale: 1/10 Pain location: low back B Pain description: soreness Aggravating factors: Unknown, fluctuates Relieving factors: Tylenol.  PRECAUTIONS: None  WEIGHT BEARING RESTRICTIONS: No  FALLS:  Has patient fallen in last 6 months? No  LIVING ENVIRONMENT: Lives with: lives with their family and lives alone Lives in: House/apartment Stairs: Yes: Internal: 14 steps; can reach both and External: 3 steps; can reach both Has following equipment at home: None  OCCUPATION: N/A-exercises regularly.  PLOF: Independent  PATIENT GOALS: Patient would like to learn some exercises to decrease the pain in her back. Improve posture.  NEXT MD VISIT: None scheduled with Dr Danielle Dess.  OBJECTIVE:   DIAGNOSTIC FINDINGS:  Per referring provider-X rays in Oct show pedicle screws and hardware  in her spine with U rod connection from L5-T9. Apparent old fracture of the L sided rod that appears old and stable. Adjacent T spine appear stable.  SCREENING FOR RED FLAGS: Bowel or bladder incontinence: No Spinal tumors: No Cauda equina syndrome: No Compression fracture: No Abdominal aneurysm: No  COGNITION: Overall cognitive status: Within functional limits for tasks assessed     SENSATION: WFL  MUSCLE LENGTH: Hamstrings: B WNL, nearly 90 Thomas test: B hip flexor tightness noted, moderate  POSTURE: rounded shoulders, forward head, decreased lumbar lordosis, decreased thoracic kyphosis, anterior pelvic tilt, and rib hump on L thoracic spine  PALPATION: TTP B along gluts origin and piriformis, R > L.  LUMBAR ROM:   AROM eval  Flexion To feet  Extension 60%  Right lateral flexion To knee  Left lateral flexion Mid thigh  Right rotation WNL  Left rotation WNL   (Blank rows = not tested)  LOWER EXTREMITY ROM:   WFL except hip ext limited   LOWER EXTREMITY MMT:  B hips 4-(4-)/5, kneesand ankles 5/5  FUNCTIONAL TESTS:  5 times sit to stand: 14.8 Functional gait assessment: TBD  GAIT: Distance walked: In clinic distances Assistive device utilized: None Level of assistance: Complete Independence Comments: slightly flexed posture noted with excessive trunk sway.  TODAY'S TREATMENT:                                                                                                                              DATE:  02/15/23 Nustep level 5 x 7 minutes Horiz Abd red 2x10 Seated Rows green 2x15 S2S yellow ball chest ptess 2x10  Shoulder ER red 2x10 Supine bridges x10 LE on pball bridges, Oblq, K2C Serratus press 2lb 2x10   02/12/23 NuStep level 5 x 6 minutes Shoulder ER red 2x10 S2S yellow ball chest press 2x10  Shoulder Ext 5lb x 10  DN to R rhomdoid area   02/07/23 Nustep level 5 x 6 minutes Elliptical 2 minutes with Supervision, checked HR and O2 after no  issues  Gait outside around the back building some cues for posture, one small rest break 7# farmer carry 1 lap each hand Calf stretches Feet on ball K2C, rotation, bridges, iso abs Passive stretch HS and piriformis Education on the elliptical , farmer carry and the stretches  PATIENT EDUCATION:  Education details: POC Person educated: Patient Education method: Explanation Education comprehension: verbalized understanding  HOME EXERCISE PROGRAM: [id number] identified, but did not review with patient or provide written copy. Initiated figure 4 and gluts stretches, thomas stretch with demo and return demo.  ASSESSMENT:  CLINICAL IMPRESSION:  Pt enters doing well. Postural cues needed with seated rows and external rotation. Tactiel cue to keep elbows to side with external rotation. No pain reported during session. Pt continues to do well overall progressing towards goals.    OBJECTIVE IMPAIRMENTS: Abnormal gait, decreased activity tolerance, decreased balance, decreased coordination, decreased endurance, difficulty walking, decreased ROM, decreased strength, increased muscle spasms, impaired flexibility, improper body mechanics, postural dysfunction, and pain.   ACTIVITY LIMITATIONS: carrying, lifting, bending, sitting, squatting, and locomotion level  PARTICIPATION LIMITATIONS: meal prep, cleaning, shopping, and community activity  PERSONAL FACTORS: Age, Past/current experiences, and 1 comorbidity: Multi-level fusion years ago from T9-L5  are also affecting patient's functional outcome.   REHAB POTENTIAL: Good  CLINICAL DECISION MAKING: Stable/uncomplicated  EVALUATION COMPLEXITY: Low   GOALS: Goals reviewed with patient? Yes  SHORT TERM GOALS: Target date: 01/13/23  I with initial HEP Baseline: Goal status: 01/15/23 MET  LONG TERM GOALS: Target date: 02/26/23  I with final HEP Baseline:  Goal status:progressing 02/07/23  2.  Increase B hip strength to  4+/5 Baseline: 4-(4-)/5 Goal status: 01/24/23 progressing  3.  Patient will tolerate palpation of B gluts and piriformis without reports of pain, will also report no hip pain when performing her normal daily activities. Baseline: Fluctuating pain with activity as well as mod TTP. Goal status: MET 01/24/23  4.  Patient will demonstrate improved hip flexor mobility by being able to perform full [facility] test with the test limb lying flat on mat with knee flexion through 1/2 ROM. Baseline: Unable to achieve true [facility] test position due to pain and tightness. Goal status: progressing 01/24/23  5.  Patient will ambulate at least 300' on level and unlevel surfaces with no report of pain or unsteadiness, demonstrating upright posture throughout. Baseline: flexed posture, limited distances. Goal status: progressing 01/24/23  PLAN:  PT FREQUENCY: 1-2x/week  PT DURATION: 8 weeks  PLANNED INTERVENTIONS: Therapeutic exercises, Therapeutic activity, Neuromuscular re-education, Balance training, Gait training, Patient/Family education, Self Care, Joint mobilization, Stair training, Dry Needling, Electrical stimulation, Cryotherapy, Moist heat, Taping, Ionotophoresis 4mg /ml Dexamethasone, and Manual therapy.  PLAN FOR NEXT SESSION: progress postural and core strength. GAIT Encounter Date: 02/15/2023   Grayce Sessions, PTA 02/15/2023, 11:03 AM

## 2023-02-19 ENCOUNTER — Ambulatory Visit: Payer: Medicare Other | Admitting: Physical Therapy

## 2023-02-19 DIAGNOSIS — R262 Difficulty in walking, not elsewhere classified: Secondary | ICD-10-CM

## 2023-02-19 DIAGNOSIS — R293 Abnormal posture: Secondary | ICD-10-CM | POA: Diagnosis not present

## 2023-02-19 DIAGNOSIS — M25551 Pain in right hip: Secondary | ICD-10-CM | POA: Diagnosis not present

## 2023-02-19 DIAGNOSIS — M25552 Pain in left hip: Secondary | ICD-10-CM | POA: Diagnosis not present

## 2023-02-19 DIAGNOSIS — M6281 Muscle weakness (generalized): Secondary | ICD-10-CM

## 2023-02-19 DIAGNOSIS — G8929 Other chronic pain: Secondary | ICD-10-CM | POA: Diagnosis not present

## 2023-02-19 NOTE — Therapy (Signed)
OUTPATIENT PHYSICAL THERAPY THORACOLUMBAR TREATMENT       Patient Name: Kayla [name] MRN: [id number] DOB:1945-09-24, 77 y.o., female Today's Date: 02/19/2023  END OF SESSION:  PT End of Session - 02/19/23 1147     Visit Number 13    Date for PT Re-Evaluation 02/26/23    PT Start Time 1145    PT Stop Time 1230    PT Time Calculation (min) 45 min              Past Medical History:  Diagnosis Date   Abscess of left groin 11/10/2014   Allergy    Arthritis    Asthma    Cataract 2015   Very mild   Cellulitis 10/20/2014   Diverticulitis    Diverticulosis    Fasting hyperglycemia 04/2011   FBS 108   Fibromyalgia    Fundic gland polyps of stomach, benign    GERD (gastroesophageal reflux disease)    gastric polyp x3   Glaucoma     Dr Hazle Quant   Hypertension    IBS (irritable bowel syndrome)    Osteopenia    last 07/2011   Pneumonia     OP as child   Shingles    Past Surgical History:  Procedure Laterality Date   COLONOSCOPY W/ POLYPECTOMY  1993,2002,2007   last  colonoscopy 2007, Dr Leone Payor   DILATION AND CURETTAGE OF UTERUS     ESOPHAGOGASTRODUODENOSCOPY     SPINE SURGERY     T9-L5 fusions   Patient Active Problem List   Diagnosis Date Noted   Bilateral low back pain with sciatica 06/12/2022   Weakness 06/12/2022   Pulmonary nodule less than 1 cm in diameter with low risk for malignant neoplasm 03/14/2021   Asthmatic bronchitis 03/09/2020   Allergies 01/18/2020   Preventative health care 03/29/2019   Pneumonia of left lower lobe due to infectious organism 09/01/2018   Vaginal atrophy 03/24/2018   Seasonal allergies 02/08/2018   Erythema nodosum 06/09/2016   Herpes zoster 09/23/2013   Superficial bruising of finger 02/16/2013   Hyperlipidemia 11/01/2011   Palpitations 05/23/2011   Essential hypertension 04/23/2011   Cough 04/09/2011   THYROID FUNCTION TEST, ABNORMAL 12/20/2009   COLONIC POLYPS, HX OF 11/08/2008   Asthma 01/21/2008   GERD  01/21/2008   IRRITABLE BOWEL SYNDROME 01/21/2008   FIBROMYALGIA 01/21/2008   Vitamin D deficiency 04/02/2007   DEGENERATIVE DISC DISEASE 04/02/2007   GLAUCOMA, LOW TENSION 02/06/2007   Osteoporosis 02/06/2007    PCP: Donato Schultz, DO  REFERRING PROVIDER: Barnett Abu, MD  REFERRING DIAG:  Diagnosis  Z87.39 (ICD-10-CM) - Personal history of other diseases of the musculoskeletal system and connective tissue  Postural exercise program for scoliosis emphasizing neutral and extension exercises.  Rationale for Evaluation and Treatment: Rehabilitation  THERAPY DIAG:  Muscle weakness (generalized)  Difficulty in walking, not elsewhere classified  Abnormal posture  Chronic hip pain, bilateral  ONSET DATE: 12/20/22  SUBJECTIVE:                                                                            SUBJECTIVE STATEMENT: feeling better, DN really helped. Doing more and more at gym PERTINENT HISTORY:  Per referring provider  S/P T9-L5 fusion years ago  PAIN:  Are you having pain? Yes: NPRS scale: 1/10 Pain location: low back B Pain description: soreness Aggravating factors: Unknown, fluctuates Relieving factors: Tylenol.  PRECAUTIONS: None  WEIGHT BEARING RESTRICTIONS: No  FALLS:  Has patient fallen in last 6 months? No  LIVING ENVIRONMENT: Lives with: lives with their family and lives alone Lives in: House/apartment Stairs: Yes: Internal: 14 steps; can reach both and External: 3 steps; can reach both Has following equipment at home: None  OCCUPATION: N/A-exercises regularly.  PLOF: Independent  PATIENT GOALS: Patient would like to learn some exercises to decrease the pain in her back. Improve posture.  NEXT MD VISIT: None scheduled with Dr Danielle Dess.  OBJECTIVE:   DIAGNOSTIC FINDINGS:  Per referring provider-X rays in Oct show pedicle screws and hardware in her spine with U rod connection from L5-T9. Apparent old fracture of the L sided rod that  appears old and stable. Adjacent T spine appear stable.  SCREENING FOR RED FLAGS: Bowel or bladder incontinence: No Spinal tumors: No Cauda equina syndrome: No Compression fracture: No Abdominal aneurysm: No  COGNITION: Overall cognitive status: Within functional limits for tasks assessed     SENSATION: WFL  MUSCLE LENGTH: Hamstrings: B WNL, nearly 90 Thomas test: B hip flexor tightness noted, moderate  POSTURE: rounded shoulders, forward head, decreased lumbar lordosis, decreased thoracic kyphosis, anterior pelvic tilt, and rib hump on L thoracic spine  PALPATION: TTP B along gluts origin and piriformis, R > L.  LUMBAR ROM:   AROM eval  Flexion To feet  Extension 60%  Right lateral flexion To knee  Left lateral flexion Mid thigh  Right rotation WNL  Left rotation WNL   (Blank rows = not tested)  LOWER EXTREMITY ROM:   WFL except hip ext limited   LOWER EXTREMITY MMT:  B hips 4-(4-)/5, kneesand ankles 5/5  FUNCTIONAL TESTS:  5 times sit to stand: 14.8 Functional gait assessment: TBD  GAIT: Distance walked: In clinic distances Assistive device utilized: None Level of assistance: Complete Independence Comments: slightly flexed posture noted with excessive trunk sway.  TODAY'S TREATMENT:                                                                                                                              DATE:   02/19/23 UBE L 3 3 min fwd/3 min back - working on maintaining upright trunk Nustep level 5 x 7 minutes Green tband rows and shld ext 2 sets 12 Black tband trunk flex and ext 20 x Step up 4 inch plus airex 10 x each leg fwd and laterally Wt ball ex Answered questions about gym ex machines    02/15/23 Nustep level 5 x 7 minutes Horiz Abd red 2x10 Seated Rows green 2x15 S2S yellow ball chest ptess 2x10  Shoulder ER red 2x10 Supine bridges x10 LE on pball bridges, Oblq, K2C Serratus press 2lb 2x10   02/12/23 NuStep level 5 x  6  minutes Shoulder ER red 2x10 S2S yellow ball chest press 2x10  Shoulder Ext 5lb x 10  DN to R rhomdoid area   02/07/23 Nustep level 5 x 6 minutes Elliptical 2 minutes with Supervision, checked HR and O2 after no issues Gait outside around the back building some cues for posture, one small rest break 7# farmer carry 1 lap each hand Calf stretches Feet on ball K2C, rotation, bridges, iso abs Passive stretch HS and piriformis Education on the elliptical , farmer carry and the stretches  PATIENT EDUCATION:  Education details: POC Person educated: Patient Education method: Explanation Education comprehension: verbalized understanding  HOME EXERCISE PROGRAM: [id number] identified, but did not review with patient or provide written copy. Initiated figure 4 and gluts stretches, thomas stretch with demo and return demo.  ASSESSMENT:  CLINICAL IMPRESSION:  Pt enters doing well. Postural cues needed and educ as we progressed as she is trying to duplicate ex at gym.No pain reported during session. Pt continues to do well overall progressing towards goals.    OBJECTIVE IMPAIRMENTS: Abnormal gait, decreased activity tolerance, decreased balance, decreased coordination, decreased endurance, difficulty walking, decreased ROM, decreased strength, increased muscle spasms, impaired flexibility, improper body mechanics, postural dysfunction, and pain.   ACTIVITY LIMITATIONS: carrying, lifting, bending, sitting, squatting, and locomotion level  PARTICIPATION LIMITATIONS: meal prep, cleaning, shopping, and community activity  PERSONAL FACTORS: Age, Past/current experiences, and 1 comorbidity: Multi-level fusion years ago from T9-L5  are also affecting patient's functional outcome.   REHAB POTENTIAL: Good  CLINICAL DECISION MAKING: Stable/uncomplicated  EVALUATION COMPLEXITY: Low   GOALS: Goals reviewed with patient? Yes  SHORT TERM GOALS: Target date: 01/13/23  I with initial  HEP Baseline: Goal status: 01/15/23 MET  LONG TERM GOALS: Target date: 02/26/23  I with final HEP Baseline:  Goal status:progressing 02/07/23  2.  Increase B hip strength to 4+/5 Baseline: 4-(4-)/5 Goal status: 01/24/23 progressing  3.  Patient will tolerate palpation of B gluts and piriformis without reports of pain, will also report no hip pain when performing her normal daily activities. Baseline: Fluctuating pain with activity as well as mod TTP. Goal status: MET 01/24/23  4.  Patient will demonstrate improved hip flexor mobility by being able to perform full [facility] test with the test limb lying flat on mat with knee flexion through 1/2 ROM. Baseline: Unable to achieve true [facility] test position due to pain and tightness. Goal status: progressing 01/24/23  5.  Patient will ambulate at least 300' on level and unlevel surfaces with no report of pain or unsteadiness, demonstrating upright posture throughout. Baseline: flexed posture, limited distances. Goal status: progressing 01/24/23  PLAN:  PT FREQUENCY: 1-2x/week  PT DURATION: 8 weeks  PLANNED INTERVENTIONS: Therapeutic exercises, Therapeutic activity, Neuromuscular re-education, Balance training, Gait training, Patient/Family education, Self Care, Joint mobilization, Stair training, Dry Needling, Electrical stimulation, Cryotherapy, Moist heat, Taping, Ionotophoresis 4mg /ml Dexamethasone, and Manual therapy.  PLAN FOR NEXT SESSION: D/C at end of cert date Encounter Date: 02/19/2023   Nicola Girt 02/19/2023, 11:48 [facility] at [facility] [address] [facility] [place], [place], [address] Phone: [phone]   Fax:  [phone]  Patient Details  Name: MALLORI MEASEL MRN: [id number] Date of Birth: [birth year] Referring Provider:  Donato Schultz, *  Encounter Date: 02/19/2023   Suanne Marker, PTA 02/19/2023, 11:48 AM  [facility]  at [facility] [address] [facility]. [place], [place], [address] Phone: [phone]   Fax:  [phone] 

## 2023-02-21 ENCOUNTER — Ambulatory Visit: Payer: Medicare Other | Admitting: Physical Therapy

## 2023-02-21 DIAGNOSIS — M25552 Pain in left hip: Secondary | ICD-10-CM | POA: Diagnosis not present

## 2023-02-21 DIAGNOSIS — R262 Difficulty in walking, not elsewhere classified: Secondary | ICD-10-CM

## 2023-02-21 DIAGNOSIS — M25551 Pain in right hip: Secondary | ICD-10-CM | POA: Diagnosis not present

## 2023-02-21 DIAGNOSIS — R293 Abnormal posture: Secondary | ICD-10-CM | POA: Diagnosis not present

## 2023-02-21 DIAGNOSIS — M6281 Muscle weakness (generalized): Secondary | ICD-10-CM

## 2023-02-21 DIAGNOSIS — G8929 Other chronic pain: Secondary | ICD-10-CM | POA: Diagnosis not present

## 2023-02-21 NOTE — Therapy (Signed)
OUTPATIENT PHYSICAL THERAPY THORACOLUMBAR TREATMENT       Patient Name: Kayla [name] MRN: [id number] DOB:[birth year], 77 y.o., female Today's Date: 02/21/2023  END OF SESSION:  PT End of Session - 02/21/23 1056     Visit Number 14    PT Start Time 1100    PT Stop Time 1145    PT Time Calculation (min) 45 min              Past Medical History:  Diagnosis Date   Abscess of left groin 11/10/2014   Allergy    Arthritis    Asthma    Cataract 2015   Very mild   Cellulitis 10/20/2014   Diverticulitis    Diverticulosis    Fasting hyperglycemia 04/2011   FBS 108   Fibromyalgia    Fundic gland polyps of stomach, benign    GERD (gastroesophageal reflux disease)    gastric polyp x3   Glaucoma     Dr Hazle Quant   Hypertension    IBS (irritable bowel syndrome)    Osteopenia    last 07/2011   Pneumonia     OP as child   Shingles    Past Surgical History:  Procedure Laterality Date   COLONOSCOPY W/ POLYPECTOMY  1993,2002,2007   last  colonoscopy 2007, Dr Leone Payor   DILATION AND CURETTAGE OF UTERUS     ESOPHAGOGASTRODUODENOSCOPY     SPINE SURGERY     T9-L5 fusions   Patient Active Problem List   Diagnosis Date Noted   Bilateral low back pain with sciatica 06/12/2022   Weakness 06/12/2022   Pulmonary nodule less than 1 cm in diameter with low risk for malignant neoplasm 03/14/2021   Asthmatic bronchitis 03/09/2020   Allergies 01/18/2020   Preventative health care 03/29/2019   Pneumonia of left lower lobe due to infectious organism 09/01/2018   Vaginal atrophy 03/24/2018   Seasonal allergies 02/08/2018   Erythema nodosum 06/09/2016   Herpes zoster 09/23/2013   Superficial bruising of finger 02/16/2013   Hyperlipidemia 11/01/2011   Palpitations 05/23/2011   Essential hypertension 04/23/2011   Cough 04/09/2011   THYROID FUNCTION TEST, ABNORMAL 12/20/2009   COLONIC POLYPS, HX OF 11/08/2008   Asthma 01/21/2008   GERD 01/21/2008   IRRITABLE BOWEL SYNDROME  01/21/2008   FIBROMYALGIA 01/21/2008   Vitamin D deficiency 04/02/2007   DEGENERATIVE DISC DISEASE 04/02/2007   GLAUCOMA, LOW TENSION 02/06/2007   Osteoporosis 02/06/2007    PCP: Donato Schultz, DO  REFERRING PROVIDER: Barnett Abu, MD  REFERRING DIAG:  Diagnosis  Z87.39 (ICD-10-CM) - Personal history of other diseases of the musculoskeletal system and connective tissue  Postural exercise program for scoliosis emphasizing neutral and extension exercises.  Rationale for Evaluation and Treatment: Rehabilitation  THERAPY DIAG:  Muscle weakness (generalized)  Difficulty in walking, not elsewhere classified  Abnormal posture  ONSET DATE: 12/20/22  SUBJECTIVE:                                                                            SUBJECTIVE STATEMENT:  doing well   PERTINENT HISTORY:  Per referring provider S/P T9-L5 fusion years ago  PAIN:  Are you having pain? Yes: NPRS scale: 1/10 Pain location: low  back B Pain description: soreness Aggravating factors: Unknown, fluctuates Relieving factors: Tylenol.  PRECAUTIONS: None  WEIGHT BEARING RESTRICTIONS: No  FALLS:  Has patient fallen in last 6 months? No  LIVING ENVIRONMENT: Lives with: lives with their family and lives alone Lives in: House/apartment Stairs: Yes: Internal: 14 steps; can reach both and External: 3 steps; can reach both Has following equipment at home: None  OCCUPATION: N/A-exercises regularly.  PLOF: Independent  PATIENT GOALS: Patient would like to learn some exercises to decrease the pain in her back. Improve posture.  NEXT MD VISIT: None scheduled with Dr Danielle Dess.  OBJECTIVE:   DIAGNOSTIC FINDINGS:  Per referring provider-X rays in Oct show pedicle screws and hardware in her spine with U rod connection from L5-T9. Apparent old fracture of the L sided rod that appears old and stable. Adjacent T spine appear stable.  SCREENING FOR RED FLAGS: Bowel or bladder incontinence:  No Spinal tumors: No Cauda equina syndrome: No Compression fracture: No Abdominal aneurysm: No  COGNITION: Overall cognitive status: Within functional limits for tasks assessed     SENSATION: WFL  MUSCLE LENGTH: Hamstrings: B WNL, nearly 90 Thomas test: B hip flexor tightness noted, moderate  POSTURE: rounded shoulders, forward head, decreased lumbar lordosis, decreased thoracic kyphosis, anterior pelvic tilt, and rib hump on L thoracic spine  PALPATION: TTP B along gluts origin and piriformis, R > L.  LUMBAR ROM:   AROM eval  Flexion To feet  Extension 60%  Right lateral flexion To knee  Left lateral flexion Mid thigh  Right rotation WNL  Left rotation WNL   (Blank rows = not tested)  LOWER EXTREMITY ROM:   WFL except hip ext limited   LOWER EXTREMITY MMT:  B hips 4-(4-)/5, kneesand ankles 5/5  FUNCTIONAL TESTS:  5 times sit to stand: 14.8 Functional gait assessment: TBD  GAIT: Distance walked: In clinic distances Assistive device utilized: None Level of assistance: Complete Independence Comments: slightly flexed posture noted with excessive trunk sway.  TODAY'S TREATMENT:                                                                                                                              DATE:   02/21/23 Amb outside 7 min good stride,cadeance, able ot talk with mild SOB and working hard to maintain upright posture UBE L 3 3 min fwd/3 min back - working on maintaining upright trunk STS wt ball press 12 x on airex Wt ball rotation 12 x each Standing on airex with tband scap stab ex 12 x multi directional, trunk rotation and lateral flexion 6 # 15x each Hip 4 way BIL 10 # cable pulley     02/19/23 UBE L 3 3 min fwd/3 min back - working on maintaining upright trunk Nustep level 5 x 7 minutes Green tband rows and shld ext 2 sets 12 Black tband trunk flex and ext 20 x Step up 4 inch plus airex 10 x each  leg fwd and laterally Wt ball ex Answered  questions about gym ex machines    02/15/23 Nustep level 5 x 7 minutes Horiz Abd red 2x10 Seated Rows green 2x15 S2S yellow ball chest ptess 2x10  Shoulder ER red 2x10 Supine bridges x10 LE on pball bridges, Oblq, K2C Serratus press 2lb 2x10   02/12/23 NuStep level 5 x 6 minutes Shoulder ER red 2x10 S2S yellow ball chest press 2x10  Shoulder Ext 5lb x 10  DN to R rhomdoid area   02/07/23 Nustep level 5 x 6 minutes Elliptical 2 minutes with Supervision, checked HR and O2 after no issues Gait outside around the back building some cues for posture, one small rest break 7# farmer carry 1 lap each hand Calf stretches Feet on ball K2C, rotation, bridges, iso abs Passive stretch HS and piriformis Education on the elliptical , farmer carry and the stretches  PATIENT EDUCATION:  Education details: POC Person educated: Patient Education method: Explanation Education comprehension: verbalized understanding  HOME EXERCISE PROGRAM: [id number]-Program identified, but did not review with patient or provide written copy. Initiated figure 4 and gluts stretches, thomas stretch with demo and return demo.  ASSESSMENT:  CLINICAL IMPRESSION:  All goals met. Will D/C next session and transition to HEP and gym Amb outside 7 min good stride,cadeance, able ot talk with mild SOB and working hard to maintain upright posture.   OBJECTIVE IMPAIRMENTS: Abnormal gait, decreased activity tolerance, decreased balance, decreased coordination, decreased endurance, difficulty walking, decreased ROM, decreased strength, increased muscle spasms, impaired flexibility, improper body mechanics, postural dysfunction, and pain.   ACTIVITY LIMITATIONS: carrying, lifting, bending, sitting, squatting, and locomotion level  PARTICIPATION LIMITATIONS: meal prep, cleaning, shopping, and community activity  PERSONAL FACTORS: Age, Past/current experiences, and 1 comorbidity: Multi-level fusion years ago from T9-L5  are  also affecting patient's functional outcome.   REHAB POTENTIAL: Good  CLINICAL DECISION MAKING: Stable/uncomplicated  EVALUATION COMPLEXITY: Low   GOALS: Goals reviewed with patient? Yes  SHORT TERM GOALS: Target date: 01/13/23  I with initial HEP Baseline: Goal status: 01/15/23 MET  LONG TERM GOALS: Target date: 02/26/23  I with final HEP Baseline:  Goal status:progressing 02/07/23  02/21/23 MET  2.  Increase B hip strength to 4+/5 Baseline: 4-(4-)/5 Goal status: 01/24/23 progressing  02/21/23 MET  3.  Patient will tolerate palpation of B gluts and piriformis without reports of pain, will also report no hip pain when performing her normal daily activities. Baseline: Fluctuating pain with activity as well as mod TTP. Goal status: MET 01/24/23  4.  Patient will demonstrate improved hip flexor mobility by being able to perform full [facility] test with the test limb lying flat on mat with knee flexion through 1/2 ROM. Baseline: Unable to achieve true [facility] test position due to pain and tightness. Goal status: progressing 01/24/23  MET 02/21/23  5.  Patient will ambulate at least 300' on level and unlevel surfaces with no report of pain or unsteadiness, demonstrating upright posture throughout. Baseline: flexed posture, limited distances. Goal status: progressing 01/24/23  MET 02/21/23  PLAN:  PT FREQUENCY: 1-2x/week  PT DURATION: 8 weeks  PLANNED INTERVENTIONS: Therapeutic exercises, Therapeutic activity, Neuromuscular re-education, Balance training, Gait training, Patient/Family education, Self Care, Joint mobilization, Stair training, Dry Needling, Electrical stimulation, Cryotherapy, Moist heat, Taping, Ionotophoresis 4mg /ml Dexamethasone, and Manual therapy.  PLAN FOR NEXT SESSION: D/C netx session Encounter Date: 02/21/2023   Nicola Girt 02/21/2023, 10:56 [facility]patient Rehabilitation at [facility] [address] [place]  [place], [place],  [address] Phone: [phone]   Fax:  [phone]  Patient Details  Name: Kayla [name] MRN: [id number] Date of Birth: 1946-08-02 Referring Provider:  Barnett Abu, MD  Encounter Date: 02/21/2023   Suanne Marker, PTA 02/21/2023, 10:56 AM  [facility] [facility] at [facility] [address] [facility] [place], [place], [address] Phone: [phone]   Fax:  [phone] at [facility] [address] [facility] [place]. [place], [place], [address] Phone: [phone]   Fax:  [phone]  Patient Details  Name: Kayla [name] MRN: [id number] Date of Birth: [birth year] Referring Provider:  Barnett Abu, MD  Encounter Date: 02/21/2023   Suanne Marker, PTA 02/21/2023, 10:56 AM  [place]  [facility] at [facility] [address] [facility] [place], [place], [address] Phone: [phone]   Fax:  [phone]

## 2023-02-26 ENCOUNTER — Ambulatory Visit: Payer: Medicare Other | Admitting: Physical Therapy

## 2023-02-26 DIAGNOSIS — R262 Difficulty in walking, not elsewhere classified: Secondary | ICD-10-CM | POA: Diagnosis not present

## 2023-02-26 DIAGNOSIS — R293 Abnormal posture: Secondary | ICD-10-CM | POA: Diagnosis not present

## 2023-02-26 DIAGNOSIS — G8929 Other chronic pain: Secondary | ICD-10-CM | POA: Diagnosis not present

## 2023-02-26 DIAGNOSIS — M6281 Muscle weakness (generalized): Secondary | ICD-10-CM | POA: Diagnosis not present

## 2023-02-26 DIAGNOSIS — M25552 Pain in left hip: Secondary | ICD-10-CM | POA: Diagnosis not present

## 2023-02-26 DIAGNOSIS — M25551 Pain in right hip: Secondary | ICD-10-CM | POA: Diagnosis not present

## 2023-02-26 NOTE — Therapy (Signed)
OUTPATIENT PHYSICAL THERAPY THORACOLUMBAR TREATMENT       Patient Name: Kayla [name] MRN: [id number] DOB:03/14/1946, 77 y.o., female Today's Date: 02/26/2023  END OF SESSION:  PT End of Session - 02/26/23 1149     Visit Number 15    Date for PT Re-Evaluation 02/26/23    PT Start Time 1145    PT Stop Time 1230    PT Time Calculation (min) 45 min              Past Medical History:  Diagnosis Date   Abscess of left groin 11/10/2014   Allergy    Arthritis    Asthma    Cataract 2015   Very mild   Cellulitis 10/20/2014   Diverticulitis    Diverticulosis    Fasting hyperglycemia 04/2011   FBS 108   Fibromyalgia    Fundic gland polyps of stomach, benign    GERD (gastroesophageal reflux disease)    gastric polyp x3   Glaucoma     Dr Hazle Quant   Hypertension    IBS (irritable bowel syndrome)    Osteopenia    last 07/2011   Pneumonia     OP as child   Shingles    Past Surgical History:  Procedure Laterality Date   COLONOSCOPY W/ POLYPECTOMY  1993,2002,2007   last  colonoscopy 2007, Dr Leone Payor   DILATION AND CURETTAGE OF UTERUS     ESOPHAGOGASTRODUODENOSCOPY     SPINE SURGERY     T9-L5 fusions   Patient Active Problem List   Diagnosis Date Noted   Bilateral low back pain with sciatica 06/12/2022   Weakness 06/12/2022   Pulmonary nodule less than 1 cm in diameter with low risk for malignant neoplasm 03/14/2021   Asthmatic bronchitis 03/09/2020   Allergies 01/18/2020   Preventative health care 03/29/2019   Pneumonia of left lower lobe due to infectious organism 09/01/2018   Vaginal atrophy 03/24/2018   Seasonal allergies 02/08/2018   Erythema nodosum 06/09/2016   Herpes zoster 09/23/2013   Superficial bruising of finger 02/16/2013   Hyperlipidemia 11/01/2011   Palpitations 05/23/2011   Essential hypertension 04/23/2011   Cough 04/09/2011   THYROID FUNCTION TEST, ABNORMAL 12/20/2009   COLONIC POLYPS, HX OF 11/08/2008   Asthma 01/21/2008   GERD  01/21/2008   IRRITABLE BOWEL SYNDROME 01/21/2008   FIBROMYALGIA 01/21/2008   Vitamin D deficiency 04/02/2007   DEGENERATIVE DISC DISEASE 04/02/2007   GLAUCOMA, LOW TENSION 02/06/2007   Osteoporosis 02/06/2007    PCP: Donato Schultz, DO  REFERRING PROVIDER: Barnett Abu, MD  REFERRING DIAG:  Diagnosis  Z87.39 (ICD-10-CM) - Personal history of other diseases of the musculoskeletal system and connective tissue  Postural exercise program for scoliosis emphasizing neutral and extension exercises.  Rationale for Evaluation and Treatment: Rehabilitation  THERAPY DIAG:  Muscle weakness (generalized)  Abnormal posture  ONSET DATE: 12/20/22  SUBJECTIVE:                                                                            SUBJECTIVE STATEMENT:  doing well and much more confidence at gym. Ready for D/C   PERTINENT HISTORY:  Per referring provider S/P T9-L5 fusion years ago  PAIN:  Are  you having pain? Yes: NPRS scale: 1/10 Pain location: low back B Pain description: soreness Aggravating factors: Unknown, fluctuates Relieving factors: Tylenol.  PRECAUTIONS: None  WEIGHT BEARING RESTRICTIONS: No  FALLS:  Has patient fallen in last 6 months? No  LIVING ENVIRONMENT: Lives with: lives with their family and lives alone Lives in: House/apartment Stairs: Yes: Internal: 14 steps; can reach both and External: 3 steps; can reach both Has following equipment at home: None  OCCUPATION: N/A-exercises regularly.  PLOF: Independent  PATIENT GOALS: Patient would like to learn some exercises to decrease the pain in her back. Improve posture.  NEXT MD VISIT: None scheduled with Dr Danielle Dess.  OBJECTIVE:   DIAGNOSTIC FINDINGS:  Per referring provider-X rays in Oct show pedicle screws and hardware in her spine with U rod connection from L5-T9. Apparent old fracture of the L sided rod that appears old and stable. Adjacent T spine appear stable.  SCREENING FOR RED  FLAGS: Bowel or bladder incontinence: No Spinal tumors: No Cauda equina syndrome: No Compression fracture: No Abdominal aneurysm: No  COGNITION: Overall cognitive status: Within functional limits for tasks assessed     SENSATION: WFL  MUSCLE LENGTH: Hamstrings: B WNL, nearly 90 Thomas test: B hip flexor tightness noted, moderate  POSTURE: rounded shoulders, forward head, decreased lumbar lordosis, decreased thoracic kyphosis, anterior pelvic tilt, and rib hump on L thoracic spine  PALPATION: TTP B along gluts origin and piriformis, R > L.  LUMBAR ROM:   AROM eval  Flexion To feet  Extension 60%  Right lateral flexion To knee  Left lateral flexion Mid thigh  Right rotation WNL  Left rotation WNL   (Blank rows = not tested)  LOWER EXTREMITY ROM:   WFL except hip ext limited   LOWER EXTREMITY MMT:  B hips 4-(4-)/5, kneesand ankles 5/5  FUNCTIONAL TESTS:  5 times sit to stand: 14.8 Functional gait assessment: TBD  GAIT: Distance walked: In clinic distances Assistive device utilized: None Level of assistance: Complete Independence Comments: slightly flexed posture noted with excessive trunk sway.  TODAY'S TREATMENT:                                                                                                                              DATE:   02/26/23 UBE L 4 3 min each way Nustep L 5 LE only Hip 4 way BIL 10 # cable pulley Wt ball standing trunk ext and rotation 15 x each Standing on airex 6 inch alt taps 20x Standing on airex with 6 inch step ups 10 x each    02/21/23 Amb outside 7 min good stride,cadeance, able ot talk with mild SOB and working hard to maintain upright posture UBE L 3 3 min fwd/3 min back - working on maintaining upright trunk STS wt ball press 12 x on airex Wt ball rotation 12 x each Standing on airex with tband scap stab ex 12 x multi directional, trunk rotation and lateral  flexion 6 # 15x each Hip 4 way BIL 10 # cable  pulley     02/19/23 UBE L 3 3 min fwd/3 min back - working on maintaining upright trunk Nustep level 5 x 7 minutes Green tband rows and shld ext 2 sets 12 Black tband trunk flex and ext 20 x Step up 4 inch plus airex 10 x each leg fwd and laterally Wt ball ex Answered questions about gym ex machines    02/15/23 Nustep level 5 x 7 minutes Horiz Abd red 2x10 Seated Rows green 2x15 S2S yellow ball chest ptess 2x10  Shoulder ER red 2x10 Supine bridges x10 LE on pball bridges, Oblq, K2C Serratus press 2lb 2x10   02/12/23 NuStep level 5 x 6 minutes Shoulder ER red 2x10 S2S yellow ball chest press 2x10  Shoulder Ext 5lb x 10  DN to R rhomdoid area   02/07/23 Nustep level 5 x 6 minutes Elliptical 2 minutes with Supervision, checked HR and O2 after no issues Gait outside around the back building some cues for posture, one small rest break 7# farmer carry 1 lap each hand Calf stretches Feet on ball K2C, rotation, bridges, iso abs Passive stretch HS and piriformis Education on the elliptical , farmer carry and the stretches  PATIENT EDUCATION:  Education details: POC Person educated: Patient Education method: Explanation Education comprehension: verbalized understanding  HOME EXERCISE PROGRAM: [id number] identified, but did not review with patient or provide written copy. Initiated figure 4 and gluts stretches, thomas stretch with demo and return demo.  ASSESSMENT:  CLINICAL IMPRESSION:  All goals met. D/C OBJECTIVE IMPAIRMENTS: Abnormal gait, decreased activity tolerance, decreased balance, decreased coordination, decreased endurance, difficulty walking, decreased ROM, decreased strength, increased muscle spasms, impaired flexibility, improper body mechanics, postural dysfunction, and pain.   ACTIVITY LIMITATIONS: carrying, lifting, bending, sitting, squatting, and locomotion level  PARTICIPATION LIMITATIONS: meal prep, cleaning, shopping, and community  activity  PERSONAL FACTORS: Age, Past/current experiences, and 1 comorbidity: Multi-level fusion years ago from T9-L5  are also affecting patient's functional outcome.   REHAB POTENTIAL: Good  CLINICAL DECISION MAKING: Stable/uncomplicated  EVALUATION COMPLEXITY: Low   GOALS: Goals reviewed with patient? Yes  SHORT TERM GOALS: Target date: 01/13/23  I with initial HEP Baseline: Goal status: 01/15/23 MET  LONG TERM GOALS: Target date: 02/26/23  I with final HEP Baseline:  Goal status:progressing 02/07/23  02/21/23 MET  2.  Increase B hip strength to 4+/5 Baseline: 4-(4-)/5 Goal status: 01/24/23 progressing  02/21/23 MET  3.  Patient will tolerate palpation of B gluts and piriformis without reports of pain, will also report no hip pain when performing her normal daily activities. Baseline: Fluctuating pain with activity as well as mod TTP. Goal status: MET 01/24/23  4.  Patient will demonstrate improved hip flexor mobility by being able to perform full [facility] test with the test limb lying flat on mat with knee flexion through 1/2 ROM. Baseline: Unable to achieve true [facility] test position due to pain and tightness. Goal status: progressing 01/24/23  MET 02/21/23  5.  Patient will ambulate at least 300' on level and unlevel surfaces with no report of pain or unsteadiness, demonstrating upright posture throughout. Baseline: flexed posture, limited distances. Goal status: progressing 01/24/23  MET 02/21/23  PLAN:  PT FREQUENCY: 1-2x/week  PT DURATION: 8 weeks  PLANNED INTERVENTIONS: Therapeutic exercises, Therapeutic activity, Neuromuscular re-education, Balance training, Gait training, Patient/Family education, Self Care, Joint mobilization, Stair training, Dry Needling, Electrical stimulation, Cryotherapy, Moist heat, Taping, Ionotophoresis 4mg /ml  Dexamethasone, and Manual therapy.  PLAN FOR NEXT SESSION: D/C   PHYSICAL THERAPY DISCHARGE SUMMARY   Patient agrees to discharge.  Patient goals were met. Patient is being discharged due to meeting the stated rehab goals.  Encounter Date: 02/26/2023   Nicola Girt 02/26/2023, 11:49 [facility] [address] [place], [place], [address] Phone: [phone]   Fax:  [phone]  Patient Details  Name: Kayla [name] MRN: [id number] Date of Birth: [birth year] Referring Provider:  Barnett Abu, MD  Encounter Date: 02/26/2023

## 2023-03-02 ENCOUNTER — Other Ambulatory Visit: Payer: Self-pay | Admitting: Family Medicine

## 2023-03-02 DIAGNOSIS — I10 Essential (primary) hypertension: Secondary | ICD-10-CM

## 2023-03-22 DIAGNOSIS — H2513 Age-related nuclear cataract, bilateral: Secondary | ICD-10-CM | POA: Diagnosis not present

## 2023-03-22 DIAGNOSIS — H04123 Dry eye syndrome of bilateral lacrimal glands: Secondary | ICD-10-CM | POA: Diagnosis not present

## 2023-03-22 DIAGNOSIS — H401131 Primary open-angle glaucoma, bilateral, mild stage: Secondary | ICD-10-CM | POA: Diagnosis not present

## 2023-04-06 ENCOUNTER — Other Ambulatory Visit: Payer: Self-pay | Admitting: Family Medicine

## 2023-04-06 DIAGNOSIS — T7840XD Allergy, unspecified, subsequent encounter: Secondary | ICD-10-CM

## 2023-05-10 ENCOUNTER — Telehealth: Payer: Self-pay

## 2023-05-10 NOTE — Telephone Encounter (Signed)
Initial Comment Caller states she was pushing a chair under the table and her left hand thumb was caught between the chair and table. She had gloves on but it tore her skin and it started bleeding. It tore through a couple of layer of skin. She cleaned it and wrapped it with gauze. Translation No Nurse Assessment Nurse: Tennis Ship, RN, Clarisse Gouge Date/Time (Eastern Time): 05/09/2023 6:36:53 PM Confirm and document reason for call. If symptomatic, describe symptoms. ---Caller states pinched flesh part of thumb between chair and table, skin tear. Cleaned with soap and water and alcohol and ointment. Does the patient have any new or worsening symptoms? ---Yes Will a triage be completed? ---Yes Related visit to physician within the last 2 weeks? ---No Does the PT have any chronic conditions? (i.e. diabetes, asthma, this includes High risk factors for pregnancy, etc.) ---Yes List chronic conditions. ---HTN, asthma, Is this a behavioral health or substance abuse call? ---No Guidelines Guideline Title Affirmed Question Affirmed Notes Nurse Date/Time (Eastern Time) Finger Injury Small cut (scratch) or abrasion (scrape) is also present Tennis Ship, Metallurgist 05/09/2023 6:40:51 PM Disp. Time [name] Time) Disposition Final User 05/09/2023 6:48:16 PM Home Care Yes Tennis Ship, RN, Clarisse Gouge PLEASE NOTE: All timestamps contained within this report are represented as [place] Standard Time. CONFIDENTIALTY NOTICE: This fax transmission is intended only for the addressee. It contains information that is legally privileged, confidential or otherwise protected from use or disclosure. If you are not the intended recipient, you are strictly prohibited from reviewing, disclosing, copying using or disseminating any of this information or taking any action in reliance on or regarding this information. If you have received this fax in error, please notify us immediately by telephone so that we can arrange for its return to  [place]. Phone: [phone], Toll-Free: [phone], Fax: [phone] Page: 2 of 2 Call Id: [id number] Final Disposition 05/09/2023 6:48:16 PM Home Care Yes Tennis Ship, RN, Clarisse Gouge Caller Disagree/Comply Comply Caller Understands Yes PreDisposition Call Doctor Care Advice Given Per Guideline HOME CARE: * You should be able to treat this at home. ANTIBIOTIC OINTMENT FOR A CUT OR SCRAPE: * Put a small amount of antibiotic ointment on the wound once a day for 3 days. * Use Bacitracin ointment (OTC in [place] or Polysporin ointment (OTC in [place]) or one that you already have. DRESSING A CUT OR SCRAPE: PAIN MEDICINES: CALL BACK IF: * Dirt in the wound persists after scrubbing * Looks infected (pus, redness) * Doesn't heal within 10 days * You become worse CARE ADVICE given per Finger Injury (Adult) guideline

## 2023-05-14 ENCOUNTER — Ambulatory Visit (INDEPENDENT_AMBULATORY_CARE_PROVIDER_SITE_OTHER): Payer: Medicare Other | Admitting: Family Medicine

## 2023-05-14 ENCOUNTER — Ambulatory Visit (HOSPITAL_BASED_OUTPATIENT_CLINIC_OR_DEPARTMENT_OTHER)
Admission: RE | Admit: 2023-05-14 | Discharge: 2023-05-14 | Disposition: A | Discharge status: Home / Self Care | Payer: Medicare Other | Source: Ambulatory Visit | Attending: Family Medicine | Admitting: Family Medicine

## 2023-05-14 ENCOUNTER — Encounter: Payer: Self-pay | Admitting: Family Medicine

## 2023-05-14 ENCOUNTER — Ambulatory Visit (INDEPENDENT_AMBULATORY_CARE_PROVIDER_SITE_OTHER): Payer: Medicare Other

## 2023-05-14 VITALS — BP 138/80 | HR 54 | Temp 98.6°F | Resp 18 | Ht 60.0 in | Wt 147.0 lb

## 2023-05-14 DIAGNOSIS — M25562 Pain in left knee: Secondary | ICD-10-CM | POA: Diagnosis not present

## 2023-05-14 DIAGNOSIS — M25462 Effusion, left knee: Secondary | ICD-10-CM | POA: Diagnosis not present

## 2023-05-14 DIAGNOSIS — M79661 Pain in right lower leg: Secondary | ICD-10-CM

## 2023-05-14 DIAGNOSIS — M25561 Pain in right knee: Secondary | ICD-10-CM

## 2023-05-14 DIAGNOSIS — Z23 Encounter for immunization: Secondary | ICD-10-CM

## 2023-05-14 DIAGNOSIS — M79662 Pain in left lower leg: Secondary | ICD-10-CM | POA: Diagnosis not present

## 2023-05-14 NOTE — Progress Notes (Signed)
Established Patient Office Visit  Subjective   Patient ID: Kayla [name], female    DOB: 1946-02-07  Age: 77 y.o. MRN: [id number]  Chief Complaint  Patient presents with   Knee Pain    Left knee, no falls or injury, sxs started Wed night last week. Pain on and off    Discussed the use of AI scribe software for clinical note transcription with the patient, who gave verbal consent to proceed.  History of Present Illness   The patient presents with left knee pain that started several days ago. She describes the pain as severe and it woke her up in the middle of the night. She usually sleeps on her left side with a pillow between her legs and she believes she may have twisted her knee while sleeping. The pain is not consistent and varies in intensity. She also reports that a few days prior to the knee pain, she experienced similar pain in her foot and a few weeks ago, she had pain in her ankle which was associated with a small amount of swelling. She also mentions a history of occasional knee pain that would last a few days and then resolve. She has been going to the gym regularly and doing the same exercises, including leg extensions, leg press, and leg curls. She also mentions that she spent a long time on her knees during a prayer session last week. She has been managing the pain with Tylenol and Biofreeze.       History of Present Illness   The patient presents with left knee pain that started several days ago. She describes the pain as severe and it woke her up in the middle of the night. She usually sleeps on her left side with a pillow between her legs and she believes she may have twisted her knee while sleeping. The pain is not consistent and varies in intensity. She also reports that a few days prior to the knee pain, she experienced similar pain in her foot and a few weeks ago, she had pain in her ankle which was associated with a small amount of swelling. She also mentions a history  of occasional knee pain that would last a few days and then resolve. She has been going to the gym regularly and doing the same exercises, including leg extensions, leg press, and leg curls. She also mentions that she spent a long time on her knees during a prayer session last week. She has been managing the pain with Tylenol and Biofreeze.      Patient Active Problem List   Diagnosis Date Noted   Bilateral low back pain with sciatica 06/12/2022   Weakness 06/12/2022   Pulmonary nodule less than 1 cm in diameter with low risk for malignant neoplasm 03/14/2021   Asthmatic bronchitis 03/09/2020   Allergies 01/18/2020   Preventative health care 03/29/2019   Pneumonia of left lower lobe due to infectious organism 09/01/2018   Vaginal atrophy 03/24/2018   Seasonal allergies 02/08/2018   Erythema nodosum 06/09/2016   Herpes zoster 09/23/2013   Superficial bruising of finger 02/16/2013   Hyperlipidemia 11/01/2011   Palpitations 05/23/2011   Essential hypertension 04/23/2011   Cough 04/09/2011   THYROID FUNCTION TEST, ABNORMAL 12/20/2009   COLONIC POLYPS, HX OF 11/08/2008   Asthma 01/21/2008   GERD 01/21/2008   IRRITABLE BOWEL SYNDROME 01/21/2008   FIBROMYALGIA 01/21/2008   Vitamin D deficiency 04/02/2007   DEGENERATIVE DISC DISEASE 04/02/2007   GLAUCOMA, LOW TENSION 02/06/2007  Osteoporosis 02/06/2007   Past Medical History:  Diagnosis Date   Abscess of left groin 11/10/2014   Allergy    Arthritis    Asthma    Cataract 2015   Very mild   Cellulitis 10/20/2014   Diverticulitis    Diverticulosis    Fasting hyperglycemia 04/2011   FBS 108   Fibromyalgia    Fundic gland polyps of stomach, benign    GERD (gastroesophageal reflux disease)    gastric polyp x3   Glaucoma     Dr Hazle Quant   Hypertension    IBS (irritable bowel syndrome)    Osteopenia    last 07/2011   Pneumonia     OP as child   Shingles    Past Surgical History:  Procedure Laterality Date   COLONOSCOPY W/  POLYPECTOMY  1993,2002,2007   last  colonoscopy 2007, Dr Leone Payor   DILATION AND CURETTAGE OF UTERUS     ESOPHAGOGASTRODUODENOSCOPY     SPINE SURGERY     T9-L5 fusions   Social History   Tobacco Use   Smoking status: Former    Current packs/day: 0.00    Average packs/day: 0.5 packs/day for 10.0 years (5.0 ttl pk-yrs)    Types: Cigarettes    Start date: 09/10/1964    Quit date: 09/10/1974    Years since quitting: 48.7   Smokeless tobacco: Never   Tobacco comments:    smoked 1966- 1976, up to 1 ppd  Vaping Use   Vaping status: Never Used  Substance Use Topics   Alcohol use: No    Alcohol/week: 0.0 standard drinks of alcohol   Drug use: No   Social History   Socioeconomic History   Marital status: Widowed    Spouse name: Not on file   Number of children: Not on file   Years of education: Not on file   Highest education level: Some college, no degree  Occupational History   Occupation: retired  Tobacco Use   Smoking status: Former    Current packs/day: 0.00    Average packs/day: 0.5 packs/day for 10.0 years (5.0 ttl pk-yrs)    Types: Cigarettes    Start date: 09/10/1964    Quit date: 09/10/1974    Years since quitting: 48.7   Smokeless tobacco: Never   Tobacco comments:    smoked 1966- 1976, up to 1 ppd  Vaping Use   Vaping status: Never Used  Substance and Sexual Activity   Alcohol use: No    Alcohol/week: 0.0 standard drinks of alcohol   Drug use: No   Sexual activity: Not Currently  Other Topics Concern   Not on file  Social History Narrative   Daily caffeine 3 cups   Regular exercise   Married         Social Determinants of Health   Financial Resource Strain: Low Risk  (12/21/2022)   Overall Financial Resource Strain (CARDIA)    Difficulty of Paying Living Expenses: Not very hard  Food Insecurity: No Food Insecurity (12/21/2022)   Hunger Vital Sign    Worried About Running Out of Food in the Last Year: Never true    Ran Out of Food in the Last Year: Never  true  Transportation Needs: No Transportation Needs (12/21/2022)   PRAPARE - Administrator, Civil Service (Medical): No    Lack of Transportation (Non-Medical): No  Physical Activity: Insufficiently Active (12/21/2022)   Exercise Vital Sign    Days of Exercise per Week: 3 days  Minutes of Exercise per Session: 40 min  Stress: No Stress Concern Present (12/21/2022)   [employer] of Occupational Health - Occupational Stress Questionnaire    Feeling of Stress : Not at all  Social Connections: Moderately Integrated (12/21/2022)   Social Connection and Isolation Panel [NHANES]    Frequency of Communication with Friends and Family: More than three times a week    Frequency of Social Gatherings with Friends and Family: Once a week    Attends Religious Services: More than 4 times per year    Active Member of [employer] or Organizations: Yes    Attends Banker Meetings: More than 4 times per year    Marital Status: Widowed  Intimate Partner Violence: Not At Risk (02/14/2023)   Humiliation, Afraid, Rape, and Kick questionnaire    Fear of Current or Ex-Partner: No    Emotionally Abused: No    Physically Abused: No    Sexually Abused: No   Family Status  Relation Name Status   Mother  Deceased   Father  Deceased   Sister  (Not Specified)   Mat Aunt  (Not Specified)   Mat Aunt  (Not Specified)   Oceanographer  (Not Specified)   [employer]  (Not Specified)   Oneal Grout  (Not Specified)   MGM  (Not Specified)   PGM  (Not Specified)   PGF  (Not Specified)   Son  (Not Specified)   Other  (Not Specified)   Other  (Not Specified)   Neg Hx  (Not Specified)  No partnership data on file   Family History  Problem Relation Age of Onset   Breast cancer Mother    Heart failure Father        CHF, CABG late 75s   Breast cancer Sister    Breast cancer Maternal Aunt        two   Heart disease Maternal Aunt    Breast cancer Paternal Aunt    Asthma Paternal Aunt    Coronary  artery disease Paternal Aunt        triple CABG   Stomach cancer Paternal Uncle    Heart attack Paternal Uncle         MI in 25s   Kidney disease Maternal Grandmother    Heart attack Paternal Grandmother        MI in late 51s   Heart disease Paternal Grandfather    Pulmonary embolism Son    Deep vein thrombosis Son    Heart Problems Son        Heart attacks related to a "blood disorder"    Ovarian cancer Other        Niece x2   Breast cancer Other        niece   Diabetes Neg Hx    Stroke Neg Hx    COPD Neg Hx    Colon polyps Neg Hx    Colon cancer Neg Hx    Esophageal cancer Neg Hx    Rectal cancer Neg Hx    Allergies  Allergen Reactions   Cephalosporins     Blisters  orally   Gabapentin     REACTION: rash   Levofloxacin     REACTION: stomach ache and rash   Psyllium     REACTION: rash   Sulfonamide Derivatives Anaphylaxis    REACTION: shock, urticaria   Aspirin     REACTION: stomach pain   Belladonna    Ciprofloxacin Hcl  Conjugated Estrogens    Cymbopogon    Doxycycline     Abdominal pain   Flovent Hfa [Fluticasone]    Nabumetone    Nsaids    Soybean-Containing Drug Products Other (See Comments)    GI upset   Zicam Cold Remedy [Homeopathic Products]    Zocor [Simvastatin] Itching   Nickel Itching and Rash   Paba Derivatives Itching and Rash   Prednisone Rash    NO PROBLEM WITH MEDROL DOSE PAK Oral prednisone caused facial burning, "made my face feel like it was on fire"      Review of Systems  Constitutional:  Negative for fever and malaise/fatigue.  HENT:  Negative for congestion.   Eyes:  Negative for blurred vision.  Respiratory:  Negative for shortness of breath.   Cardiovascular:  Negative for chest pain, palpitations and leg swelling.  Gastrointestinal:  Negative for abdominal pain, blood in stool and nausea.  Genitourinary:  Negative for dysuria and frequency.  Musculoskeletal:  Negative for falls.  Skin:  Negative for rash.   Neurological:  Negative for dizziness, loss of consciousness and headaches.  Endo/Heme/Allergies:  Negative for environmental allergies.  Psychiatric/Behavioral:  Negative for depression. The patient is not nervous/anxious.       Objective:     BP 138/80 (BP Location: Left Arm, Patient Position: Sitting, Cuff Size: Normal)   Pulse (!) 54   Temp 98.6 F (37 C) (Oral)   Resp 18   Ht 5' (1.524 m)   Wt 147 lb (66.7 kg)   SpO2 99%   BMI 28.71 kg/m  BP Readings from Last 3 Encounters:  05/14/23 138/80  02/14/23 136/84  12/24/22 138/70   Wt Readings from Last 3 Encounters:  05/14/23 147 lb (66.7 kg)  02/14/23 147 lb 12.8 oz (67 kg)  12/24/22 145 lb 9.6 oz (66 kg)   SpO2 Readings from Last 3 Encounters:  05/14/23 99%  12/24/22 96%  06/12/22 95%      Physical Exam Vitals and nursing note reviewed.  Constitutional:      General: She is not in acute distress.    Appearance: Normal appearance. She is well-developed.  HENT:     Head: Normocephalic and atraumatic.  Eyes:     General: No scleral icterus.       Right eye: No discharge.        Left eye: No discharge.  Cardiovascular:     Rate and Rhythm: Normal rate and regular rhythm.     Heart sounds: No murmur heard. Pulmonary:     Effort: Pulmonary effort is normal. No respiratory distress.     Breath sounds: Normal breath sounds.  Musculoskeletal:        General: Normal range of motion.     Cervical back: Normal range of motion and neck supple.     Left knee: Swelling and bony tenderness present. Tenderness present over the medial joint line and lateral joint line.     Right lower leg: No edema.     Left lower leg: No edema.     Comments: Pain back of L knee  Skin:    General: Skin is warm and dry.  Neurological:     Mental Status: She is alert and oriented to person, place, and time.  Psychiatric:        Mood and Affect: Mood normal.        Behavior: Behavior normal.        Thought Content: Thought content  normal.  Judgment: Judgment normal.     No results found for any visits on 05/14/23.  Last CBC Lab Results  Component Value Date   WBC 5.4 12/24/2022   HGB 13.4 12/24/2022   HCT 39.8 12/24/2022   MCV 91.8 12/24/2022   MCH 30.6 06/09/2022   RDW 14.2 12/24/2022   PLT 140.0 (L) 12/24/2022   Last metabolic panel Lab Results  Component Value Date   GLUCOSE 85 01/03/2023   GLUCOSE 85 01/03/2023   NA 138 01/03/2023   NA 138 01/03/2023   K 5.1 01/03/2023   K 5.1 01/03/2023   CL 102 01/03/2023   CL 102 01/03/2023   CO2 28 01/03/2023   CO2 28 01/03/2023   BUN 15 01/03/2023   BUN 15 01/03/2023   CREATININE 0.70 01/03/2023   CREATININE 0.70 01/03/2023   GFR 83.48 01/03/2023   GFR 83.48 01/03/2023   CALCIUM 9.9 01/03/2023   CALCIUM 9.9 01/03/2023   PROT 6.8 01/03/2023   ALBUMIN 4.3 01/03/2023   BILITOT 0.5 01/03/2023   ALKPHOS 90 01/03/2023   AST 19 01/03/2023   ALT 14 01/03/2023   ANIONGAP 9 06/09/2022   Last lipids Lab Results  Component Value Date   CHOL 200 01/03/2023   HDL 66.10 01/03/2023   LDLCALC 111 (H) 01/03/2023   LDLDIRECT 153.3 12/18/2012   TRIG 114.0 01/03/2023   CHOLHDL 3 01/03/2023   Last hemoglobin A1c Lab Results  Component Value Date   HGBA1C 5.4 11/01/2011   Last thyroid functions Lab Results  Component Value Date   TSH 3.19 06/12/2022   Last vitamin D Lab Results  Component Value Date   VD25OH 41.73 12/24/2022   Last vitamin B12 and Folate Lab Results  Component Value Date   VITAMINB12 271 06/12/2022   FOLATE 17.0 11/01/2006      The 10-year ASCVD risk score (Arnett DK, et al., 2019) is: 29.7%    Assessment & Plan:   Problem List Items Addressed This Visit   None Visit Diagnoses     Acute pain of right knee    -  Primary   Relevant Orders   DG Knee Complete 4 Views Right (Completed)   Need for influenza vaccination       Relevant Orders   Flu Vaccine Trivalent High Dose (Fluad) (Completed)   Pain of left calf        Relevant Orders   US Venous Img Lower Unilateral Left (DVT) (Completed)      Assessment and Plan    Left Knee Pain Acute onset, intermittent, and variable in location. Possible history of patella issue. Pain alleviated by pressure. Mild swelling noted. Possible history of prolonged kneeling. -Order knee x-ray to assess joint and possible fluid accumulation. -Consider ultrasound to rule out Baker's cyst. -Provide knee sleeve for compression and support.  General Health Maintenance -Administer influenza vaccine today.       No follow-ups on file.    Donato Schultz, DO

## 2023-05-15 ENCOUNTER — Other Ambulatory Visit: Payer: Self-pay | Admitting: Family Medicine

## 2023-05-15 ENCOUNTER — Encounter: Payer: Self-pay | Admitting: Family Medicine

## 2023-05-15 ENCOUNTER — Telehealth: Payer: Self-pay

## 2023-05-15 DIAGNOSIS — G8929 Other chronic pain: Secondary | ICD-10-CM

## 2023-05-15 NOTE — Telephone Encounter (Signed)
Pt called. LVM about correct imaging. Imaging ordered.

## 2023-05-15 NOTE — Telephone Encounter (Signed)
-----   Message from Donato Schultz sent at 05/15/2023  2:43 PM EDT ----- If I'm correct --- yes--- curious as to why she did not notice though ----- Message ----- From: Roxanne Gates, CMA Sent: 05/15/2023   2:17 PM EDT To: Donato Schultz, DO  So do you want patient to come back for the correct x-ray? ----- Message ----- From: Donato Schultz, DO Sent: 05/15/2023   9:50 AM EDT To: Parks Ranger; Alysia Penna, RN; #  The right knee was xrayed---- it should have been the left knee? I put the order in wrong I

## 2023-05-16 NOTE — Telephone Encounter (Signed)
Spoke with the reading room. They advised that the correct knee was done even though the order said right knee. They advised that they will contact imaging downstairs to have results corrected.

## 2023-05-17 ENCOUNTER — Ambulatory Visit (HOSPITAL_BASED_OUTPATIENT_CLINIC_OR_DEPARTMENT_OTHER)
Admission: RE | Admit: 2023-05-17 | Discharge: 2023-05-17 | Disposition: A | Discharge status: Home / Self Care | Payer: Medicare Other | Source: Ambulatory Visit | Attending: Family Medicine | Admitting: Family Medicine

## 2023-05-17 DIAGNOSIS — M25462 Effusion, left knee: Secondary | ICD-10-CM | POA: Diagnosis not present

## 2023-05-17 DIAGNOSIS — M25562 Pain in left knee: Secondary | ICD-10-CM | POA: Diagnosis not present

## 2023-05-17 DIAGNOSIS — M79662 Pain in left lower leg: Secondary | ICD-10-CM | POA: Diagnosis not present

## 2023-05-17 DIAGNOSIS — G8929 Other chronic pain: Secondary | ICD-10-CM | POA: Insufficient documentation

## 2023-06-17 ENCOUNTER — Ambulatory Visit (INDEPENDENT_AMBULATORY_CARE_PROVIDER_SITE_OTHER): Payer: Medicare Other | Admitting: Family Medicine

## 2023-06-17 ENCOUNTER — Encounter: Payer: Self-pay | Admitting: Family Medicine

## 2023-06-17 VITALS — BP 132/80 | HR 50 | Temp 98.8°F | Resp 18 | Ht 60.0 in | Wt 147.0 lb

## 2023-06-17 DIAGNOSIS — G8929 Other chronic pain: Secondary | ICD-10-CM | POA: Diagnosis not present

## 2023-06-17 DIAGNOSIS — M25562 Pain in left knee: Secondary | ICD-10-CM

## 2023-06-17 DIAGNOSIS — R21 Rash and other nonspecific skin eruption: Secondary | ICD-10-CM | POA: Diagnosis not present

## 2023-06-17 DIAGNOSIS — I10 Essential (primary) hypertension: Secondary | ICD-10-CM | POA: Diagnosis not present

## 2023-06-17 DIAGNOSIS — E559 Vitamin D deficiency, unspecified: Secondary | ICD-10-CM | POA: Diagnosis not present

## 2023-06-17 LAB — CBC WITH DIFFERENTIAL/PLATELET
Basophils Absolute: 0 10*3/uL (ref 0.0–0.1)
Basophils Relative: 0.7 % (ref 0.0–3.0)
Eosinophils Absolute: 0.1 10*3/uL (ref 0.0–0.7)
Eosinophils Relative: 2.1 % (ref 0.0–5.0)
HCT: 40.4 % (ref 36.0–46.0)
Hemoglobin: 13.3 g/dL (ref 12.0–15.0)
Lymphocytes Relative: 18.8 % (ref 12.0–46.0)
Lymphs Abs: 0.8 10*3/uL (ref 0.7–4.0)
MCHC: 32.9 g/dL (ref 30.0–36.0)
MCV: 93.3 fL (ref 78.0–100.0)
Monocytes Absolute: 0.4 10*3/uL (ref 0.1–1.0)
Monocytes Relative: 9 % (ref 3.0–12.0)
Neutro Abs: 3.1 10*3/uL (ref 1.4–7.7)
Neutrophils Relative %: 69.4 % (ref 43.0–77.0)
Platelets: 164 10*3/uL (ref 150.0–400.0)
RBC: 4.33 Mil/uL (ref 3.87–5.11)
RDW: 14 % (ref 11.5–15.5)
WBC: 4.5 10*3/uL (ref 4.0–10.5)

## 2023-06-17 LAB — VITAMIN D 25 HYDROXY (VIT D DEFICIENCY, FRACTURES): VITD: 41.26 ng/mL (ref 30.00–100.00)

## 2023-06-17 LAB — TSH: TSH: 2.21 u[IU]/mL (ref 0.35–5.50)

## 2023-06-17 MED ORDER — NYSTATIN 100000 UNIT/GM EX CREA
TOPICAL_CREAM | Freq: Two times a day (BID) | CUTANEOUS | 2 refills | Status: DC
Start: 2023-06-17 — End: 2024-02-28

## 2023-06-17 MED ORDER — VITAMIN D (ERGOCALCIFEROL) 1.25 MG (50000 UNIT) PO CAPS
50000.0000 [IU] | ORAL_CAPSULE | ORAL | 0 refills | Status: DC
Start: 2023-06-17 — End: 2023-12-12

## 2023-06-17 MED ORDER — METOPROLOL TARTRATE 25 MG PO TABS: ORAL_TABLET | ORAL | 3 refills | Status: DC

## 2023-06-17 NOTE — Assessment & Plan Note (Signed)
Well controlled, no changes to meds. Encouraged heart healthy diet such as the DASH diet and exercise as tolerated.  °

## 2023-06-17 NOTE — Patient Instructions (Signed)

## 2023-06-17 NOTE — Progress Notes (Signed)
Established Patient Office Visit  Subjective   Patient ID: Kayla [name], female    DOB: 06-15-1946  Age: 77 y.o. MRN: [id number]  Chief Complaint  Patient presents with   Hypertension   Follow-up    HPI Discussed the use of AI scribe software for clinical note transcription with the patient, who gave verbal consent to proceed.  History of Present Illness   The patient, with a history of fibromyalgia, presents with concerns about her knee, which she describes as a "flare up" of a chronic issue. She reports that the pain and discomfort in her knee have improved after a period of rest and a gradual return to low-impact exercise. She also expresses concerns about her calcium intake and levels, as well as her vitamin D intake. She reports that she has been managing her symptoms with diet and exercise, including the consumption of foods high in calcium and the use of vitamin D supplements. The patient also discusses the possibility of donating blood or plasma, expressing concerns about her platelet levels and white blood cell count.      Patient Active Problem List   Diagnosis Date Noted   Bilateral low back pain with sciatica 06/12/2022   Weakness 06/12/2022   Pulmonary nodule less than 1 cm in diameter with low risk for malignant neoplasm 03/14/2021   Asthmatic bronchitis 03/09/2020   Allergies 01/18/2020   Preventative health care 03/29/2019   Pneumonia of left lower lobe due to infectious organism 09/01/2018   Vaginal atrophy 03/24/2018   Seasonal allergies 02/08/2018   Erythema nodosum 06/09/2016   Herpes zoster 09/23/2013   Superficial bruising of finger 02/16/2013   Hyperlipidemia 11/01/2011   Palpitations 05/23/2011   Essential hypertension 04/23/2011   Cough 04/09/2011   THYROID FUNCTION TEST, ABNORMAL 12/20/2009   History of colonic polyps 11/08/2008   Asthma 01/21/2008   GERD 01/21/2008   IRRITABLE BOWEL SYNDROME 01/21/2008   FIBROMYALGIA 01/21/2008   Vitamin D  deficiency 04/02/2007   DEGENERATIVE DISC DISEASE 04/02/2007   GLAUCOMA, LOW TENSION 02/06/2007   Osteoporosis 02/06/2007   Past Medical History:  Diagnosis Date   Abscess of left groin 11/10/2014   Allergy    Arthritis    Asthma    Cataract 2015   Very mild   Cellulitis 10/20/2014   Diverticulitis    Diverticulosis    Fasting hyperglycemia 04/2011   FBS 108   Fibromyalgia    Fundic gland polyps of stomach, benign    GERD (gastroesophageal reflux disease)    gastric polyp x3   Glaucoma     Dr Hazle Quant   Hypertension    IBS (irritable bowel syndrome)    Osteopenia    last 07/2011   Pneumonia     OP as child   Shingles    Past Surgical History:  Procedure Laterality Date   COLONOSCOPY W/ POLYPECTOMY  1993,2002,2007   last  colonoscopy 2007, Dr Leone Payor   DILATION AND CURETTAGE OF UTERUS     ESOPHAGOGASTRODUODENOSCOPY     SPINE SURGERY     T9-L5 fusions   Social History   Tobacco Use   Smoking status: Former    Current packs/day: 0.00    Average packs/day: 0.5 packs/day for 10.0 years (5.0 ttl pk-yrs)    Types: Cigarettes    Start date: 09/10/1964    Quit date: 09/10/1974    Years since quitting: 48.8   Smokeless tobacco: Never   Tobacco comments:    smoked 1966- 1976, up to 1  ppd  Vaping Use   Vaping status: Never Used  Substance Use Topics   Alcohol use: No    Alcohol/week: 0.0 standard drinks of alcohol   Drug use: No   Social History   Socioeconomic History   Marital status: Widowed    Spouse name: Not on file   Number of children: Not on file   Years of education: Not on file   Highest education level: Some college, no degree  Occupational History   Occupation: retired  Tobacco Use   Smoking status: Former    Current packs/day: 0.00    Average packs/day: 0.5 packs/day for 10.0 years (5.0 ttl pk-yrs)    Types: Cigarettes    Start date: 09/10/1964    Quit date: 09/10/1974    Years since quitting: 48.8   Smokeless tobacco: Never   Tobacco comments:     smoked 1966- 1976, up to 1 ppd  Vaping Use   Vaping status: Never Used  Substance and Sexual Activity   Alcohol use: No    Alcohol/week: 0.0 standard drinks of alcohol   Drug use: No   Sexual activity: Not Currently  Other Topics Concern   Not on file  Social History Narrative   Daily caffeine 3 cups   Regular exercise   Married         Social Determinants of Health   Financial Resource Strain: Low Risk  (12/21/2022)   Overall Financial Resource Strain (CARDIA)    Difficulty of Paying Living Expenses: Not very hard  Food Insecurity: No Food Insecurity (12/21/2022)   Hunger Vital Sign    Worried About Running Out of Food in the Last Year: Never true    Ran Out of Food in the Last Year: Never true  Transportation Needs: No Transportation Needs (12/21/2022)   PRAPARE - Administrator, Civil Service (Medical): No    Lack of Transportation (Non-Medical): No  Physical Activity: Insufficiently Active (12/21/2022)   Exercise Vital Sign    Days of Exercise per Week: 3 days    Minutes of Exercise per Session: 40 min  Stress: No Stress Concern Present (12/21/2022)   [employer] of Occupational Health - Occupational Stress Questionnaire    Feeling of Stress : Not at all  Social Connections: Moderately Integrated (12/21/2022)   Social Connection and Isolation Panel [NHANES]    Frequency of Communication with Friends and Family: More than three times a week    Frequency of Social Gatherings with Friends and Family: Once a week    Attends Religious Services: More than 4 times per year    Active Member of [employer] or Organizations: Yes    Attends Banker Meetings: More than 4 times per year    Marital Status: Widowed  Intimate Partner Violence: Not At Risk (02/14/2023)   Humiliation, Afraid, Rape, and Kick questionnaire    Fear of Current or Ex-Partner: No    Emotionally Abused: No    Physically Abused: No    Sexually Abused: No   Family Status  Relation  Name Status   Mother  Deceased   Father  Deceased   Sister  (Not Specified)   Mat Aunt  (Not Specified)   Mat Aunt  (Not Specified)   Emelda Brothers  (Not Specified)   Emelda Brothers  (Not Specified)   Oneal Grout  (Not Specified)   MGM  (Not Specified)   PGM  (Not Specified)   PGF  (Not Specified)   Son  (Not  Specified)   Other  (Not Specified)   Other  (Not Specified)   Neg Hx  (Not Specified)  No partnership data on file   Family History  Problem Relation Age of Onset   Breast cancer Mother    Heart failure Father        CHF, CABG late 93s   Breast cancer Sister    Breast cancer Maternal Aunt        two   Heart disease Maternal Aunt    Breast cancer Paternal Aunt    Asthma Paternal Aunt    Coronary artery disease Paternal Aunt        triple CABG   Stomach cancer Paternal Uncle    Heart attack Paternal Uncle         MI in 34s   Kidney disease Maternal Grandmother    Heart attack Paternal Grandmother        MI in late 73s   Heart disease Paternal Grandfather    Pulmonary embolism Son    Deep vein thrombosis Son    Heart Problems Son        Heart attacks related to a "blood disorder"    Ovarian cancer Other        Niece x2   Breast cancer Other        niece   Diabetes Neg Hx    Stroke Neg Hx    COPD Neg Hx    Colon polyps Neg Hx    Colon cancer Neg Hx    Esophageal cancer Neg Hx    Rectal cancer Neg Hx    Allergies  Allergen Reactions   Cephalosporins     Blisters  orally   Gabapentin     REACTION: rash   Levofloxacin     REACTION: stomach ache and rash   Psyllium     REACTION: rash   Sulfonamide Derivatives Anaphylaxis    REACTION: shock, urticaria   Aspirin     REACTION: stomach pain   Belladonna    Ciprofloxacin Hcl    Conjugated Estrogens    Cymbopogon    Doxycycline     Abdominal pain   Flovent Hfa [Fluticasone]    Nabumetone    Nsaids    Soybean-Containing Drug Products Other (See Comments)    GI upset   Zicam Cold Remedy [Homeopathic Products]     Zocor [Simvastatin] Itching   Nickel Itching and Rash   Paba Derivatives Itching and Rash   Prednisone Rash    NO PROBLEM WITH MEDROL DOSE PAK Oral prednisone caused facial burning, "made my face feel like it was on fire"      Review of Systems  Constitutional:  Negative for chills, fever and malaise/fatigue.  HENT:  Negative for congestion and hearing loss.   Eyes:  Negative for blurred vision and discharge.  Respiratory:  Negative for cough, sputum production and shortness of breath.   Cardiovascular:  Negative for chest pain, palpitations and leg swelling.  Gastrointestinal:  Negative for abdominal pain, blood in stool, constipation, diarrhea, heartburn, nausea and vomiting.  Genitourinary:  Negative for dysuria, frequency, hematuria and urgency.  Musculoskeletal:  Negative for back pain, falls and myalgias.  Skin:  Negative for rash.  Neurological:  Negative for dizziness, sensory change, loss of consciousness, weakness and headaches.  Endo/Heme/Allergies:  Negative for environmental allergies. Does not bruise/bleed easily.  Psychiatric/Behavioral:  Negative for depression and suicidal ideas. The patient is not nervous/anxious and does not have insomnia.  Objective:     BP 132/80 (BP Location: Left Arm, Patient Position: Sitting, Cuff Size: Normal)   Pulse (!) 50   Temp 98.8 F (37.1 C) (Oral)   Resp 18   Ht 5' (1.524 m)   Wt 147 lb (66.7 kg)   SpO2 99%   BMI 28.71 kg/m  BP Readings from Last 3 Encounters:  06/17/23 132/80  05/14/23 138/80  02/14/23 136/84   Wt Readings from Last 3 Encounters:  06/17/23 147 lb (66.7 kg)  05/14/23 147 lb (66.7 kg)  02/14/23 147 lb 12.8 oz (67 kg)   SpO2 Readings from Last 3 Encounters:  06/17/23 99%  05/14/23 99%  12/24/22 96%      Physical Exam Vitals and nursing note reviewed.  Constitutional:      General: She is not in acute distress.    Appearance: Normal appearance. She is well-developed.  HENT:     Head:  Normocephalic and atraumatic.  Eyes:     General: No scleral icterus.       Right eye: No discharge.        Left eye: No discharge.  Cardiovascular:     Rate and Rhythm: Normal rate and regular rhythm.     Heart sounds: No murmur heard. Pulmonary:     Effort: Pulmonary effort is normal. No respiratory distress.     Breath sounds: Normal breath sounds.  Musculoskeletal:        General: Normal range of motion.     Cervical back: Normal range of motion and neck supple.     Right lower leg: No edema.     Left lower leg: No edema.  Skin:    General: Skin is warm and dry.  Neurological:     Mental Status: She is alert and oriented to person, place, and time.  Psychiatric:        Mood and Affect: Mood normal.        Behavior: Behavior normal.        Thought Content: Thought content normal.        Judgment: Judgment normal.      No results found for any visits on 06/17/23.  Last CBC Lab Results  Component Value Date   WBC 5.4 12/24/2022   HGB 13.4 12/24/2022   HCT 39.8 12/24/2022   MCV 91.8 12/24/2022   MCH 30.6 06/09/2022   RDW 14.2 12/24/2022   PLT 140.0 (L) 12/24/2022   Last metabolic panel Lab Results  Component Value Date   GLUCOSE 85 01/03/2023   GLUCOSE 85 01/03/2023   NA 138 01/03/2023   NA 138 01/03/2023   K 5.1 01/03/2023   K 5.1 01/03/2023   CL 102 01/03/2023   CL 102 01/03/2023   CO2 28 01/03/2023   CO2 28 01/03/2023   BUN 15 01/03/2023   BUN 15 01/03/2023   CREATININE 0.70 01/03/2023   CREATININE 0.70 01/03/2023   GFR 83.48 01/03/2023   GFR 83.48 01/03/2023   CALCIUM 9.9 01/03/2023   CALCIUM 9.9 01/03/2023   PROT 6.8 01/03/2023   ALBUMIN 4.3 01/03/2023   BILITOT 0.5 01/03/2023   ALKPHOS 90 01/03/2023   AST 19 01/03/2023   ALT 14 01/03/2023   ANIONGAP 9 06/09/2022   Last lipids Lab Results  Component Value Date   CHOL 200 01/03/2023   HDL 66.10 01/03/2023   LDLCALC 111 (H) 01/03/2023   LDLDIRECT 153.3 12/18/2012   TRIG 114.0 01/03/2023    CHOLHDL 3 01/03/2023   Last hemoglobin A1c  Lab Results  Component Value Date   HGBA1C 5.4 11/01/2011   Last thyroid functions Lab Results  Component Value Date   TSH 3.19 06/12/2022   Last vitamin D Lab Results  Component Value Date   VD25OH 41.73 12/24/2022   Last vitamin B12 and Folate Lab Results  Component Value Date   VITAMINB12 271 06/12/2022   FOLATE 17.0 11/01/2006      The 10-year ASCVD risk score (Arnett DK, et al., 2019) is: 27.5%    Assessment & Plan:   Problem List Items Addressed This Visit       Unprioritized   Vitamin D deficiency   Relevant Medications   Vitamin D, Ergocalciferol, (DRISDOL) 1.25 MG (50000 UNIT) CAPS capsule   Essential hypertension   Relevant Medications   metoprolol tartrate (LOPRESSOR) 25 MG tablet   Other Relevant Orders   CBC with Differential/Platelet   Comprehensive metabolic panel   Lipid panel   TSH   Other Visit Diagnoses     Chronic pain of left knee    -  Primary   Relevant Orders   Uric acid   Rash and nonspecific skin eruption       Relevant Medications   nystatin cream (MYCOSTATIN)     Assessment and Plan    Knee Pain 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 Managed with rest, ice, and occasional Tylenol. -Continue current management strategy as needed.  Calcium Pyrophosphate Deposition Disease Discussed the nature of the disease and its relation to dietary calcium. -Continue current dietary habits.  Vitamin D Deficiency Despite supplementation with 2000 IU daily, levels remain on the lower end of normal. -Prescribe high-dose Vitamin D supplement to be taken once weekly in addition to daily supplementation.  Hyperkalemia Discussed dietary sources of potassium and the importance of moderation. -Continue to moderate intake of high-potassium foods.  General Health Maintenance -Check uric acid level to assess for gout risk. -Recheck labs today, including blood  clotting times and potassium level. -Consider blood or plasma donation as desired, with awareness of potential impact on lab values.        No follow-ups on file.    Donato Schultz, DO

## 2023-06-18 LAB — LIPID PANEL
Cholesterol: 224 mg/dL — ABNORMAL HIGH (ref 0–200)
HDL: 74.8 mg/dL (ref >39.00)
LDL Cholesterol: 133 mg/dL — ABNORMAL HIGH (ref 0–99)
NonHDL: 149.56
Total CHOL/HDL Ratio: 3
Triglycerides: 82 mg/dL (ref 0.0–149.0)
VLDL: 16.4 mg/dL (ref 0.0–40.0)

## 2023-06-18 LAB — COMPREHENSIVE METABOLIC PANEL
ALT: 15 U/L (ref 0–35)
AST: 20 U/L (ref 0–37)
Albumin: 4.3 g/dL (ref 3.5–5.2)
Alkaline Phosphatase: 89 U/L (ref 39–117)
BUN: 13 mg/dL (ref 6–23)
CO2: 29 meq/L (ref 19–32)
Calcium: 9.7 mg/dL (ref 8.4–10.5)
Chloride: 103 meq/L (ref 96–112)
Creatinine, Ser: 0.7 mg/dL (ref 0.40–1.20)
GFR: 83.21 mL/min (ref >60.00)
Glucose, Bld: 93 mg/dL (ref 70–99)
Potassium: 5.6 meq/L — ABNORMAL HIGH (ref 3.5–5.1)
Sodium: 140 meq/L (ref 135–145)
Total Bilirubin: 0.6 mg/dL (ref 0.2–1.2)
Total Protein: 6.5 g/dL (ref 6.0–8.3)

## 2023-06-18 LAB — URIC ACID: Uric Acid, Serum: 3.9 mg/dL (ref 2.4–7.0)

## 2023-06-19 ENCOUNTER — Encounter: Payer: Self-pay | Admitting: Family Medicine

## 2023-06-20 NOTE — Telephone Encounter (Signed)
Just confirming the Nexium from her med list, correct?

## 2023-06-21 ENCOUNTER — Encounter: Payer: Self-pay | Admitting: Family Medicine

## 2023-07-01 ENCOUNTER — Other Ambulatory Visit: Payer: Self-pay | Admitting: Family Medicine

## 2023-07-01 DIAGNOSIS — E875 Hyperkalemia: Secondary | ICD-10-CM

## 2023-07-01 NOTE — Telephone Encounter (Signed)
Pt said she is supposed to have some labs redone sooner than 6 mths per the mychart message sent between her and provider. Orders have not been placed. Pt also wants to know if she should be fasting. Please advise when orders are placed.

## 2023-07-03 ENCOUNTER — Encounter: Payer: Self-pay | Admitting: Family Medicine

## 2023-07-03 DIAGNOSIS — M818 Other osteoporosis without current pathological fracture: Secondary | ICD-10-CM

## 2023-07-05 ENCOUNTER — Encounter: Payer: Self-pay | Admitting: Family Medicine

## 2023-07-05 ENCOUNTER — Other Ambulatory Visit (INDEPENDENT_AMBULATORY_CARE_PROVIDER_SITE_OTHER): Payer: Medicare Other

## 2023-07-05 DIAGNOSIS — E875 Hyperkalemia: Secondary | ICD-10-CM | POA: Diagnosis not present

## 2023-07-05 LAB — COMPREHENSIVE METABOLIC PANEL
ALT: 13 U/L (ref 0–35)
AST: 19 U/L (ref 0–37)
Albumin: 4.5 g/dL (ref 3.5–5.2)
Alkaline Phosphatase: 93 U/L (ref 39–117)
BUN: 13 mg/dL (ref 6–23)
CO2: 29 meq/L (ref 19–32)
Calcium: 9.5 mg/dL (ref 8.4–10.5)
Chloride: 102 meq/L (ref 96–112)
Creatinine, Ser: 0.69 mg/dL (ref 0.40–1.20)
GFR: 83.47 mL/min (ref >60.00)
Glucose, Bld: 80 mg/dL (ref 70–99)
Potassium: 4.3 meq/L (ref 3.5–5.1)
Sodium: 138 meq/L (ref 135–145)
Total Bilirubin: 0.7 mg/dL (ref 0.2–1.2)
Total Protein: 6.9 g/dL (ref 6.0–8.3)

## 2023-07-05 LAB — MAGNESIUM: Magnesium: 1.9 mg/dL (ref 1.5–2.5)

## 2023-08-02 DIAGNOSIS — Z1231 Encounter for screening mammogram for malignant neoplasm of breast: Secondary | ICD-10-CM | POA: Diagnosis not present

## 2023-08-02 DIAGNOSIS — R29890 Loss of height: Secondary | ICD-10-CM | POA: Diagnosis not present

## 2023-08-02 DIAGNOSIS — Z8262 Family history of osteoporosis: Secondary | ICD-10-CM | POA: Diagnosis not present

## 2023-08-02 DIAGNOSIS — M8588 Other specified disorders of bone density and structure, other site: Secondary | ICD-10-CM | POA: Diagnosis not present

## 2023-08-02 LAB — HM MAMMOGRAPHY

## 2023-08-02 LAB — DG BONE DENSITY

## 2023-08-05 ENCOUNTER — Encounter: Payer: Self-pay | Admitting: Family Medicine

## 2023-08-13 ENCOUNTER — Encounter: Payer: Self-pay | Admitting: Family Medicine

## 2023-08-22 ENCOUNTER — Other Ambulatory Visit: Payer: Self-pay | Admitting: Family Medicine

## 2023-08-22 DIAGNOSIS — I10 Essential (primary) hypertension: Secondary | ICD-10-CM

## 2023-09-23 DIAGNOSIS — H401131 Primary open-angle glaucoma, bilateral, mild stage: Secondary | ICD-10-CM | POA: Diagnosis not present

## 2023-09-23 DIAGNOSIS — H2513 Age-related nuclear cataract, bilateral: Secondary | ICD-10-CM | POA: Diagnosis not present

## 2023-09-23 DIAGNOSIS — H04123 Dry eye syndrome of bilateral lacrimal glands: Secondary | ICD-10-CM | POA: Diagnosis not present

## 2023-09-25 ENCOUNTER — Ambulatory Visit (INDEPENDENT_AMBULATORY_CARE_PROVIDER_SITE_OTHER): Payer: Medicare Other | Admitting: Family Medicine

## 2023-09-25 VITALS — BP 128/78 | HR 50 | Temp 98.0°F | Resp 16 | Ht 61.0 in | Wt 146.0 lb

## 2023-09-25 DIAGNOSIS — L989 Disorder of the skin and subcutaneous tissue, unspecified: Secondary | ICD-10-CM | POA: Diagnosis not present

## 2023-09-25 NOTE — Patient Instructions (Signed)
 This is likely a wart/verrucae. Send me a message when it is more prominent after exposure to hot water.  Consider an aspirin paste where you ground up some aspirin and add some water. It will make a paste and you can apply to the area daily for 10-15 min. This should resolve in ~10-14 days.  Let us  know if you need anything.

## 2023-09-25 NOTE — Progress Notes (Addendum)
 Chief Complaint  Patient presents with   Skin irritation    Right hard skin irritation    Kayla [name] is a 78 y.o. female here for a skin complaint.  Duration: several weeks Location: R ring finger over the mid phalanx Pruritic? No Painful? No Drainage? No New soaps/lotions/topicals/detergents? No Sick contacts? No Other associated symptoms: more prominent after exposure to warm water Therapies tried thus far: none  Past Medical History:  Diagnosis Date   Abscess of left groin 11/10/2014   Allergy     Arthritis    Asthma    Cataract 2015   Very mild   Cellulitis 10/20/2014   Diverticulitis    Diverticulosis    Fasting hyperglycemia 04/2011   FBS 108   Fibromyalgia    Fundic gland polyps of stomach, benign    GERD (gastroesophageal reflux disease)    gastric polyp x3   Glaucoma     Dr Danley Dusky   Hypertension    IBS (irritable bowel syndrome)    Osteopenia    last 07/2011   Pneumonia     OP as child   Shingles     BP 128/78   Pulse (!) 50   Temp 98 F (36.7 C) (Oral)   Resp 16   Ht 5\' 1"  (1.549 m)   Wt 146 lb (66.2 kg)   SpO2 98%   BMI 27.59 kg/m  Gen: awake, alert, appearing stated age Lungs: No accessory muscle use Skin: on palmar surface of R 4th digit, there is a 0.5 cm in diameter circular and slightly raised, flesh colored lesion with central umbilication. No drainage, erythema, TTP, fluctuance, excoriation Psych: Age appropriate judgment and insight  Skin lesion  Verrucae vs molluscum. Consider asa paste daily for 2 weeks. Reassurance overall. She will send message after warm water exposure and when it is more prominent.  F/u prn. The patient voiced understanding and agreement to the plan.  Shellie Dials [employer], DO 09/25/23 1:50 PM

## 2023-10-03 ENCOUNTER — Encounter: Payer: Self-pay | Admitting: Family Medicine

## 2023-10-03 DIAGNOSIS — T7840XD Allergy, unspecified, subsequent encounter: Secondary | ICD-10-CM

## 2023-10-03 MED ORDER — MONTELUKAST SODIUM 10 MG PO TABS
10.0000 mg | ORAL_TABLET | Freq: Every day | ORAL | 1 refills | Status: DC
Start: 2023-10-03 — End: 2024-03-23

## 2023-11-25 ENCOUNTER — Ambulatory Visit (INDEPENDENT_AMBULATORY_CARE_PROVIDER_SITE_OTHER): Admitting: Family Medicine

## 2023-11-25 ENCOUNTER — Encounter: Payer: Self-pay | Admitting: Family Medicine

## 2023-11-25 VITALS — BP 136/51 | HR 67 | Temp 98.3°F | Ht 61.0 in | Wt 147.0 lb

## 2023-11-25 DIAGNOSIS — J069 Acute upper respiratory infection, unspecified: Secondary | ICD-10-CM

## 2023-11-25 DIAGNOSIS — J029 Acute pharyngitis, unspecified: Secondary | ICD-10-CM | POA: Diagnosis not present

## 2023-11-25 LAB — POCT RAPID STREP A (OFFICE): Rapid Strep A Screen: NEGATIVE

## 2023-11-25 LAB — POC COVID19 BINAXNOW: SARS Coronavirus 2 Ag: NEGATIVE

## 2023-11-25 NOTE — Progress Notes (Signed)
 Acute Office Visit  Subjective:     Patient ID: Kayla [name], female    DOB: 05-Sep-1946, 78 y.o.   MRN: [id number]  Chief Complaint  Patient presents with   Cough     Patient is in today for URI with cough.   Discussed the use of AI scribe software for clinical note transcription with the patient, who gave verbal consent to proceed.  History of Present Illness Kayla [name] "Kayla [name]" is a 78 year old female who presents with upper respiratory symptoms and sore throat.  She began experiencing upper respiratory symptoms on Saturday, initially with sudden onset of stomach pain after a light meal. This was followed by ear pain and throat discomfort. By Sunday, her symptoms progressed to include congestion, sinus pressure, particularly in her cheekbones and eyelids, and a tickling cough that started on Monday. No significant mucus production with her cough and she has not been around anyone known to be sick.  Her sore throat began on Saturday and worsened significantly by the middle of the night, prompting her to gargle with salt water and use Listerine. She noted some improvement after drinking coffee the next morning, although the soreness persisted. No fever, but she reports ear pain, headache, sinus pressure, and swollen eyelids. The sore throat has improved but remains achy.  She used Nasacort and Claritin on Saturday, which did not alleviate her symptoms. She took Mucinex on Sunday morning, which provided some relief, but avoided it later due to potential sleep disruption. She also took Tylenol twice, which helped with her headache and throat pain. Ventolin was used to relieve chest tightness associated with her cough.  No fever, but she felt weak and noted darker urine, suggesting possible dehydration. She increased her fluid intake, including Gatorade, to address this.        All review of systems negative except what is listed in the HPI      Objective:    BP (!)  136/51   Pulse 67   Temp 98.3 F (36.8 C) (Oral)   Ht 5\' 1"  (1.549 m)   Wt 147 lb (66.7 kg)   SpO2 100%   BMI 27.78 kg/m    Physical Exam Vitals reviewed.  Constitutional:      General: She is not in acute distress.    Appearance: Normal appearance.  HENT:     Head: Normocephalic and atraumatic.     Comments: Maxillary sinuses tender to palpation     Right Ear: Tympanic membrane normal.     Left Ear: Tympanic membrane normal.     Nose: Congestion and rhinorrhea present.  Eyes:     Conjunctiva/sclera: Conjunctivae normal.  Cardiovascular:     Rate and Rhythm: Normal rate and regular rhythm.  Pulmonary:     Effort: Pulmonary effort is normal.     Breath sounds: Normal breath sounds. No wheezing, rhonchi or rales.  Musculoskeletal:     Cervical back: Normal range of motion and neck supple.  Lymphadenopathy:     Cervical: No cervical adenopathy.  Skin:    General: Skin is warm and dry.  Neurological:     Mental Status: She is alert and oriented to person, place, and time.  Psychiatric:        Mood and Affect: Mood normal.        Behavior: Behavior normal.        Thought Content: Thought content normal.        Judgment: Judgment normal.  Results for orders placed or performed in visit on 11/25/23  POC COVID-19 BinaxNow  Result Value Ref Range   SARS Coronavirus 2 Ag Negative Negative  POCT rapid strep A  Result Value Ref Range   Rapid Strep A Screen Negative Negative        Assessment & Plan:   Problem List Items Addressed This Visit   None Visit Diagnoses       Sore throat    -  Primary   Relevant Orders   POC COVID-19 BinaxNow (Completed)   POCT rapid strep A (Completed)     Viral URI          COVID and Strep tests are negative.  Likely a viral upper respiratory infection   Continue your OTC meds - especially Mucinex, nasal steroid spray (Flonase, Nasacort).   Continue supportive measures including rest, hydration, humidifier use,  steam showers, warm compresses to sinuses, warm liquids with lemon and honey, and over-the-counter cough, cold, and analgesics as needed. If symptoms persist 8-10 days, become severe, or return after a few days of feeling better, then please follow-up for repeat evaluation to determine if antibiotics may be necessary.   No orders of the defined types were placed in this encounter.   Return if symptoms worsen or fail to improve.  Clayborne Dana, NP

## 2023-11-25 NOTE — Patient Instructions (Addendum)
 COVID and Strep tests are negative.  Likely a viral upper respiratory infection   Continue your OTC meds - especially Mucinex, nasal steroid spray (Flonase, Nasacort).   Continue supportive measures including rest, hydration, humidifier use, steam showers, warm compresses to sinuses, warm liquids with lemon and honey, and over-the-counter cough, cold, and analgesics as needed. If symptoms persist 8-10 days, become severe, or return after a few days of feeling better, then please follow-up for repeat evaluation to determine if antibiotics may be necessary.  Over the counter medications that may be helpful for symptoms:  Guaifenesin 1200 mg extended release tabs twice daily, with plenty of water For cough and congestion Brand name: Mucinex   Pseudoephedrine 30 mg, one or two tabs every 4 to 6 hours For sinus congestion Brand name: Sudafed You must get this from the pharmacy counter.  Oxymetazoline nasal spray each morning, one spray in each nostril, for NO MORE THAN 3 days  For nasal and sinus congestion Brand name: Afrin Saline nasal spray or Saline Nasal Irrigation (Netti Pot, etc) 3-5 times a day For nasal and sinus congestion Brand names: Ocean or AYR Fluticasone nasal spray OR Mometasone nasal spray OR Triamcinolone Acetonide nasal spray - follow directions on the packaging For nasal and sinus congestion Brand name: Flonase, Nasonex, Nasacort Warm salt water gargles  For sore throat Every few hours as needed Alternate ibuprofen 400-600 mg and acetaminophen 1000 mg every 6 hours For fever, body aches, headache Brand names: Motrin or Advil and Tylenol Dextromethorphan 12-hour cough version 30 mg every 12 hours  For cough Brand name: Delsym Stop all other cold medications for now (Nyquil, Dayquil, Tylenol Cold, Theraflu, etc) and other non-prescription cough/cold preparations. Many of these have the same ingredients listed above and could cause an overdose of medication.    Herbal treatments that have been shown to be helpful in some patients include: Vitamin C 1000 mg per day Zinc 100 mg per day Quercetin 25-500 mg twice a day Melatonin 5-10mg  at bedtime Honey Green Tea  General Instructions Allow your body to rest Drink PLENTY of fluids Typically, we are the most contagious 1-2 days before symptoms start through the first 2-3 days of most severe symptoms. Per CDC guidelines, you can return to school/work when symptoms have started to improve and you have been fever-free for 24 hours. However, recommend you continue extra precautions for the following 5 days (frequent hand hygiene, masking, covering coughs/sneezes, minimize exposure to immunocompromised individuals, etc).  If you develop severe shortness of breath, uncontrolled fevers, coughing up blood, confusion, chest pain, or signs of dehydration (such as significantly decreased urine amounts or dizziness with standing) please go to the nearest ER.

## 2023-11-26 ENCOUNTER — Ambulatory Visit (INDEPENDENT_AMBULATORY_CARE_PROVIDER_SITE_OTHER): Admitting: Physician Assistant

## 2023-11-26 ENCOUNTER — Encounter: Payer: Self-pay | Admitting: Physician Assistant

## 2023-11-26 ENCOUNTER — Telehealth: Payer: Self-pay

## 2023-11-26 VITALS — BP 171/79 | HR 65 | Temp 98.0°F | Ht 61.0 in | Wt 147.8 lb

## 2023-11-26 DIAGNOSIS — J069 Acute upper respiratory infection, unspecified: Secondary | ICD-10-CM | POA: Diagnosis not present

## 2023-11-26 DIAGNOSIS — B309 Viral conjunctivitis, unspecified: Secondary | ICD-10-CM | POA: Diagnosis not present

## 2023-11-26 DIAGNOSIS — R062 Wheezing: Secondary | ICD-10-CM

## 2023-11-26 MED ORDER — METHYLPREDNISOLONE 4 MG PO TBPK
ORAL_TABLET | ORAL | 0 refills | Status: DC
Start: 2023-11-26 — End: 2023-12-12

## 2023-11-26 NOTE — Progress Notes (Signed)
 Established patient visit   Patient: Kayla [name]   DOB: 1946-01-23   78 y.o. Female  MRN: [id number] Visit Date: 11/26/2023  Today's healthcare provider: Alfredia Ferguson, PA-C   Cc. Worsening symptoms  Subjective     Pt was seen in office yesterday 3/17 for sore throat, cough, sinus congestion. COVID and strep tests were negative. She was recommended supportive treatment.  Today she reports worsened cough, a red eye with discharge. She is taking mucinex, using her inhaler.  Medications: Outpatient Medications Prior to Visit  Medication Sig   acetaminophen (TYLENOL) 500 MG tablet Take 500 mg by mouth as needed.   albuterol (PROVENTIL) (2.5 MG/3ML) 0.083% nebulizer solution Take 3 mLs (2.5 mg total) by nebulization every 6 (six) hours as needed for wheezing or shortness of breath.   albuterol (VENTOLIN HFA) 108 (90 Base) MCG/ACT inhaler Inhale 2 puffs into the lungs every 6 (six) hours as needed for wheezing or shortness of breath.   amLODipine (NORVASC) 2.5 MG tablet TAKE 1 TABLET(2.5 MG) BY MOUTH DAILY   bifidobacterium infantis (ALIGN) capsule Take 1 capsule by mouth daily.   cholecalciferol (VITAMIN D) 1000 units tablet Take 2,000 Units by mouth daily.   EPINEPHrine (EPIPEN 2-PAK) 0.3 mg/0.3 mL IJ SOAJ injection As directed   Esomeprazole Magnesium (NEXIUM 24HR PO) Take 1 capsule by mouth as needed.   famotidine (PEPCID) 20 MG tablet Take 20 mg by mouth daily.   Flaxseed, Linseed, (GROUND FLAX SEEDS PO)    hyoscyamine (LEVSIN SL) 0.125 MG SL tablet Place 1 tablet (0.125 mg total) under the tongue every 6 (six) hours as needed.   latanoprost (XALATAN) 0.005 % ophthalmic solution Place 1 drop into both eyes at bedtime.   metoprolol tartrate (LOPRESSOR) 25 MG tablet 1 po bid   montelukast (SINGULAIR) 10 MG tablet Take 1 tablet (10 mg total) by mouth at bedtime.   nystatin cream (MYCOSTATIN) Apply topically 2 (two) times daily.   Polyethyl Glycol-Propyl Glycol (SYSTANE)  0.4-0.3 % GEL ophthalmic gel Place 1 application into both eyes.   sodium chloride (OCEAN) 0.65 % SOLN nasal spray Place 1 spray into both nostrils as needed for congestion.   triamcinolone (KENALOG) 0.1 % Apply 1 application topically 2 (two) times daily.   Vitamin D, Ergocalciferol, (DRISDOL) 1.25 MG (50000 UNIT) CAPS capsule Take 1 capsule (50,000 Units total) by mouth every 7 (seven) days.   Vitamin Mixture (ESTER-C PO) Take 1,000 mg by mouth daily.   No facility-administered medications prior to visit.    Review of Systems  Constitutional:  Positive for fatigue. Negative for fever.  HENT:  Positive for congestion and sore throat.   Eyes:  Positive for discharge and redness.  Respiratory:  Positive for cough and wheezing. Negative for shortness of breath.   Cardiovascular:  Negative for chest pain and leg swelling.  Gastrointestinal:  Negative for abdominal pain.  Neurological:  Negative for dizziness and headaches.       Objective    BP (!) 171/79   Pulse 65   Temp 98 F (36.7 C)   Ht 5\' 1"  (1.549 m)   Wt 147 lb 12.8 oz (67 kg)   SpO2 99%   BMI 27.93 kg/m    Physical Exam Constitutional:      General: She is awake.     Appearance: She is well-developed.  HENT:     Head: Normocephalic.  Eyes:     General:        Right  eye: Discharge present.     Conjunctiva/sclera:     Right eye: Right conjunctiva is injected.     Pupils: Pupils are equal, round, and reactive to light.     Comments: Mild erythema and edema surrounding R eye  Cardiovascular:     Rate and Rhythm: Normal rate and regular rhythm.     Heart sounds: Normal heart sounds.  Pulmonary:     Effort: Pulmonary effort is normal.     Breath sounds: Wheezing present.  Skin:    General: Skin is warm.  Neurological:     Mental Status: She is alert and oriented to person, place, and time.  Psychiatric:        Attention and Perception: Attention normal.        Mood and Affect: Mood normal.        Speech:  Speech normal.        Behavior: Behavior is cooperative.     No results found for any visits on 11/26/23.  Assessment & Plan    Viral URI -     methylPREDNISolone; Take 6 pills on day 1, 5 pills on day 2, 4 pills on day 3, 3 pills on day 4, 2 pills on day 5, and 1 pill on day 6  Dispense: 1 each; Refill: 0  Viral conjunctivitis of right eye  Wheezing -     methylPREDNISolone; Take 6 pills on day 1, 5 pills on day 2, 4 pills on day 3, 3 pills on day 4, 2 pills on day 5, and 1 pill on day 6  Dispense: 1 each; Refill: 0   Recommending rest, hydration, cont albuterol inhaler, mucinex. Rx medrol dose pack -- pt has a sensitivity to regular prednisone .  Recommending serial warm compresses R eye.   If symptoms do not improve x 2-3 days recommending chest xray.  Return if symptoms worsen or fail to improve.       Alfredia Ferguson, PA-C  [facility] at [facility] [phone] (phone) [phone] (fax)  [facility]

## 2023-11-26 NOTE — Telephone Encounter (Signed)
 Pt being seen in office this AM     Patient Name First: Kayla Last: [name] Gender: Female DOB: 1946/01/27 Age: 78 Y 2 M 17 [address] [place]  [address] Client [facility] Night - Client Client Site  [facility] Night Provider Seabron Spates- MD Contact Type Call Who Is Calling Patient / Member / Family / Caregiver Call Type Triage / Clinical Relationship To Patient Self Return Phone Number [phone] (Primary) Chief Complaint Cough Reason for Call Symptomatic / Request for Health Information Initial Comment Caller states she has cough, congestion, and a sore throat. GOTO Facility Not Listed [place] Translation No Nurse Assessment Nurse: Waynetta Sandy, RN, Sarah Date/Time [name] Time): 11/26/2023 3:21:39 AM Confirm and document reason for call. If symptomatic, describe symptoms. ---Pt. states she has cough, congestion, and a sore throat. She saw the PA this morning. No fever. Does the patient have any new or worsening symptoms? ---Yes Will a triage be completed? ---Yes Related visit to physician within the last 2 weeks? ---Yes Does the PT have any chronic conditions? (i.e. diabetes, asthma, this includes High risk factors for pregnancy, etc.) ---Yes List chronic conditions. ---Asthma Is this a behavioral health or substance abuse call? ---N

## 2023-11-27 ENCOUNTER — Ambulatory Visit: Admitting: Medical

## 2023-11-28 ENCOUNTER — Ambulatory Visit (INDEPENDENT_AMBULATORY_CARE_PROVIDER_SITE_OTHER): Admitting: Physician Assistant

## 2023-11-28 ENCOUNTER — Encounter: Payer: Self-pay | Admitting: Physician Assistant

## 2023-11-28 ENCOUNTER — Ambulatory Visit (HOSPITAL_BASED_OUTPATIENT_CLINIC_OR_DEPARTMENT_OTHER)
Admission: RE | Admit: 2023-11-28 | Discharge: 2023-11-28 | Disposition: A | Discharge status: Home / Self Care | Source: Ambulatory Visit | Attending: Physician Assistant | Admitting: Physician Assistant

## 2023-11-28 VITALS — BP 151/77 | HR 71 | Temp 98.9°F | Ht 61.0 in | Wt 149.0 lb

## 2023-11-28 DIAGNOSIS — R0989 Other specified symptoms and signs involving the circulatory and respiratory systems: Secondary | ICD-10-CM

## 2023-11-28 DIAGNOSIS — R051 Acute cough: Secondary | ICD-10-CM | POA: Diagnosis not present

## 2023-11-28 DIAGNOSIS — H1131 Conjunctival hemorrhage, right eye: Secondary | ICD-10-CM | POA: Diagnosis not present

## 2023-11-28 DIAGNOSIS — R062 Wheezing: Secondary | ICD-10-CM

## 2023-11-28 DIAGNOSIS — Z981 Arthrodesis status: Secondary | ICD-10-CM | POA: Diagnosis not present

## 2023-11-28 MED ORDER — ALBUTEROL SULFATE (2.5 MG/3ML) 0.083% IN NEBU
2.5000 mg | INHALATION_SOLUTION | Freq: Four times a day (QID) | RESPIRATORY_TRACT | 1 refills | Status: AC | PRN
Start: 2023-11-28 — End: ?

## 2023-11-28 MED ORDER — AMOXICILLIN-POT CLAVULANATE 875-125 MG PO TABS
1.0000 | ORAL_TABLET | Freq: Two times a day (BID) | ORAL | 0 refills | Status: AC
Start: 2023-11-28 — End: 2023-12-05

## 2023-11-28 NOTE — Progress Notes (Signed)
 Established patient visit   Patient: Kayla [name]   DOB: 10-Feb-1946   78 y.o. Female  MRN: [id number] Visit Date: 11/28/2023  Today's healthcare provider: Alfredia Ferguson, PA-C   Cc. Continued cough, cold symptoms Subjective     Pt was seen 3/17 and again in office 3/18 for the same upper respiratory symptoms. 3/18 I started her on a medrol dose pack. Symptoms started Saturday now x 6 days. Today, she reports her eye symptoms have moved to the other eye-- her eye doctor confirmed viral conjunctivitis. He did rx erythromycin ointment.  Otherwise, persistent cough, trouble sleeping.  Medications: Outpatient Medications Prior to Visit  Medication Sig   acetaminophen (TYLENOL) 500 MG tablet Take 500 mg by mouth as needed.   albuterol (VENTOLIN HFA) 108 (90 Base) MCG/ACT inhaler Inhale 2 puffs into the lungs every 6 (six) hours as needed for wheezing or shortness of breath.   amLODipine (NORVASC) 2.5 MG tablet TAKE 1 TABLET(2.5 MG) BY MOUTH DAILY   bifidobacterium infantis (ALIGN) capsule Take 1 capsule by mouth daily.   cholecalciferol (VITAMIN D) 1000 units tablet Take 2,000 Units by mouth daily.   EPINEPHrine (EPIPEN 2-PAK) 0.3 mg/0.3 mL IJ SOAJ injection As directed   Esomeprazole Magnesium (NEXIUM 24HR PO) Take 1 capsule by mouth as needed.   famotidine (PEPCID) 20 MG tablet Take 20 mg by mouth daily.   Flaxseed, Linseed, (GROUND FLAX SEEDS PO)    hyoscyamine (LEVSIN SL) 0.125 MG SL tablet Place 1 tablet (0.125 mg total) under the tongue every 6 (six) hours as needed.   latanoprost (XALATAN) 0.005 % ophthalmic solution Place 1 drop into both eyes at bedtime.   methylPREDNISolone (MEDROL DOSEPAK) 4 MG TBPK tablet Take 6 pills on day 1, 5 pills on day 2, 4 pills on day 3, 3 pills on day 4, 2 pills on day 5, and 1 pill on day 6   metoprolol tartrate (LOPRESSOR) 25 MG tablet 1 po bid   montelukast (SINGULAIR) 10 MG tablet Take 1 tablet (10 mg total) by mouth at bedtime.    nystatin cream (MYCOSTATIN) Apply topically 2 (two) times daily.   Polyethyl Glycol-Propyl Glycol (SYSTANE) 0.4-0.3 % GEL ophthalmic gel Place 1 application into both eyes.   sodium chloride (OCEAN) 0.65 % SOLN nasal spray Place 1 spray into both nostrils as needed for congestion.   triamcinolone (KENALOG) 0.1 % Apply 1 application topically 2 (two) times daily.   Vitamin D, Ergocalciferol, (DRISDOL) 1.25 MG (50000 UNIT) CAPS capsule Take 1 capsule (50,000 Units total) by mouth every 7 (seven) days.   Vitamin Mixture (ESTER-C PO) Take 1,000 mg by mouth daily.   [DISCONTINUED] albuterol (PROVENTIL) (2.5 MG/3ML) 0.083% nebulizer solution Take 3 mLs (2.5 mg total) by nebulization every 6 (six) hours as needed for wheezing or shortness of breath.   No facility-administered medications prior to visit.    Review of Systems  Constitutional:  Positive for fatigue. Negative for fever.  Eyes:  Positive for discharge, redness and itching.  Respiratory:  Positive for cough, shortness of breath and wheezing.   Cardiovascular:  Negative for chest pain and leg swelling.  Gastrointestinal:  Negative for abdominal pain.  Neurological:  Negative for dizziness and headaches.       Objective    BP (!) 151/77   Pulse 71   Temp 98.9 F (37.2 C)   Ht 5\' 1"  (1.549 m)   Wt 149 lb (67.6 kg)   SpO2 99%   BMI  28.15 kg/m    Physical Exam Constitutional:      General: She is awake.     Appearance: She is well-developed.  HENT:     Head: Normocephalic.  Eyes:     Comments: B/l conjunctiva erythematous, R>L Surrounding edema around R eye improved,but still present  Cardiovascular:     Rate and Rhythm: Normal rate and regular rhythm.     Heart sounds: Normal heart sounds.  Pulmonary:     Effort: Pulmonary effort is normal.     Breath sounds: Wheezing and rhonchi present.  Skin:    General: Skin is warm.  Neurological:     Mental Status: She is alert and oriented to person, place, and time.   Psychiatric:        Attention and Perception: Attention normal.        Mood and Affect: Mood normal.        Speech: Speech normal.        Behavior: Behavior is cooperative.     No results found for any visits on 11/28/23.  Assessment & Plan    Acute cough -     Albuterol Sulfate; Take 3 mLs (2.5 mg total) by nebulization every 6 (six) hours as needed for wheezing or shortness of breath.  Dispense: 150 mL; Refill: 1 -     Amoxicillin-Pot Clavulanate; Take 1 tablet by mouth 2 (two) times daily for 7 days.  Dispense: 14 tablet; Refill: 0 -     DG Chest 2 View  Abnormal lung sounds -     DG Chest 2 View  Wheezing -     Albuterol Sulfate; Take 3 mLs (2.5 mg total) by nebulization every 6 (six) hours as needed for wheezing or shortness of breath.  Dispense: 150 mL; Refill: 1   Given worsening breath sounds-- rx chest xray r/o pneumonia. Will tx augmentin bid x 1 weeks Pt has a nebulizer at home, rx albuterol neb solution, encouraged q 6 hours.  Cont medrol dose pack and otc meds.  Return if symptoms worsen or fail to improve.       Alfredia Ferguson, PA-C  [facility] at [facility] [phone] (phone) [phone] (fax)  [facility]

## 2023-11-29 ENCOUNTER — Encounter: Payer: Self-pay | Admitting: Physician Assistant

## 2023-11-29 MED ORDER — NEBULIZER CUP/TUBING DEVI: 1.0000 | Freq: Every day | 10 refills | Status: DC | PRN

## 2023-11-29 MED ORDER — NEBULIZER AIR TUBE/PLUGS MISC: 1.0000 | Freq: Every day | 10 refills | Status: DC | PRN

## 2023-11-29 MED ORDER — NEBULIZER MASK ADULT MISC: 1.0000 | Freq: Every day | 10 refills | Status: DC | PRN

## 2023-11-29 NOTE — Addendum Note (Signed)
 Addended byLiliana Cline L on: 11/29/2023 11:01 AM   Modules accepted: Orders

## 2023-11-30 ENCOUNTER — Ambulatory Visit
Admission: RE | Admit: 2023-11-30 | Discharge: 2023-11-30 | Disposition: A | Discharge status: Home / Self Care | Source: Ambulatory Visit | Attending: Family Medicine | Admitting: Family Medicine

## 2023-11-30 VITALS — BP 149/71 | HR 67 | Temp 98.8°F | Resp 16

## 2023-11-30 DIAGNOSIS — J209 Acute bronchitis, unspecified: Secondary | ICD-10-CM

## 2023-11-30 NOTE — ED Provider Notes (Signed)
 [name]    CSN: [id number] Arrival date & time: 11/30/23  4098      History   Chief Complaint Chief Complaint  Patient presents with   Cough    Have been seen 3 times at [facility] this week, but still up all night last night coughing.   On Antibiotic ( Augmentin and Medrol Dose Pk). FYI also have Conjunctivitis - Entered by patient    HPI Kayla [name] is a 78 y.o. female.    Cough Associated symptoms: chest pain (Chest hurts when she coughs), shortness of breath and wheezing   Associated symptoms: no chills, no ear pain and no sore throat   Productive cough for 1 week, has been seen by PCP several times COVID and flu are negative, chest x-ray was normal, was treated with steroid Dosepak, Augmentin and nebulizer treatments.  Taking over-the-counter Mucinex once daily.  Also has conjunctivitis currently being treated with an ointment.  Past Medical History:  Diagnosis Date   Abscess of left groin 11/10/2014   Allergy    Arthritis    Asthma    Cataract 2015   Very mild   Cellulitis 10/20/2014   Diverticulitis    Diverticulosis    Fasting hyperglycemia 04/2011   FBS 108   Fibromyalgia    Fundic gland polyps of stomach, benign    GERD (gastroesophageal reflux disease)    gastric polyp x3   Glaucoma     Dr Hazle Quant   Hypertension    IBS (irritable bowel syndrome)    Osteopenia    last 07/2011   Pneumonia     OP as child   Shingles     Patient Active Problem List   Diagnosis Date Noted   Bilateral low back pain with sciatica 06/12/2022   Weakness 06/12/2022   Pulmonary nodule less than 1 cm in diameter with low risk for malignant neoplasm 03/14/2021   Asthmatic bronchitis 03/09/2020   Allergies 01/18/2020   Preventative health care 03/29/2019   Pneumonia of left lower lobe due to infectious organism 09/01/2018   Vaginal atrophy 03/24/2018   Seasonal allergies 02/08/2018   Erythema nodosum 06/09/2016   Herpes zoster 09/23/2013    Superficial bruising of finger 02/16/2013   Hyperlipidemia 11/01/2011   Palpitations 05/23/2011   Essential hypertension 04/23/2011   Cough 04/09/2011   THYROID FUNCTION TEST, ABNORMAL 12/20/2009   History of colonic polyps 11/08/2008   Asthma 01/21/2008   GERD 01/21/2008   IRRITABLE BOWEL SYNDROME 01/21/2008   FIBROMYALGIA 01/21/2008   Vitamin D deficiency 04/02/2007   DEGENERATIVE DISC DISEASE 04/02/2007   GLAUCOMA, LOW TENSION 02/06/2007   Osteoporosis 02/06/2007    Past Surgical History:  Procedure Laterality Date   COLONOSCOPY W/ POLYPECTOMY  1191,4782,9562   last  colonoscopy 2007, Dr Leone Payor   DILATION AND CURETTAGE OF UTERUS     ESOPHAGOGASTRODUODENOSCOPY     SPINE SURGERY     T9-L5 fusions    OB History   No obstetric history on file.      Home Medications    Prior to Admission medications   Medication Sig Start Date End Date Taking? Authorizing Provider  acetaminophen (TYLENOL) 500 MG tablet Take 500 mg by mouth as needed.    [provider]  albuterol (PROVENTIL) (2.5 MG/3ML) 0.083% nebulizer solution Take 3 mLs (2.5 mg total) by nebulization every 6 (six) hours as needed for wheezing or shortness of breath. 11/28/23   Ok Edwards, Lillia Abed, PA-C  albuterol (VENTOLIN HFA) 108 (90  Base) MCG/ACT inhaler Inhale 2 puffs into the lungs every 6 (six) hours as needed for wheezing or shortness of breath. 12/03/22   Zola Button, Grayling Congress, DO  amLODipine (NORVASC) 2.5 MG tablet TAKE 1 TABLET(2.5 MG) BY MOUTH DAILY 08/22/23   Seabron Spates R, DO  amoxicillin-clavulanate (AUGMENTIN) 875-125 MG tablet Take 1 tablet by mouth 2 (two) times daily for 7 days. 11/28/23 12/05/23  Alfredia Ferguson, PA-C  bifidobacterium infantis (ALIGN) capsule Take 1 capsule by mouth daily. 12/29/12   Iva Boop, MD  cholecalciferol (VITAMIN D) 1000 units tablet Take 2,000 Units by mouth daily. 12/24/16   [provider]  EPINEPHrine (EPIPEN 2-PAK) 0.3 mg/0.3 mL IJ SOAJ injection  As directed 01/03/22   Zola Button, Grayling Congress, DO  Esomeprazole Magnesium (NEXIUM 24HR PO) Take 1 capsule by mouth as needed.    [provider]  famotidine (PEPCID) 20 MG tablet Take 20 mg by mouth daily.    [provider]  Flaxseed, Linseed, (GROUND FLAX SEEDS PO)  05/26/19   [provider]  hyoscyamine (LEVSIN SL) 0.125 MG SL tablet Place 1 tablet (0.125 mg total) under the tongue every 6 (six) hours as needed. 01/10/22   Saguier, Ramon Dredge, PA-C  latanoprost (XALATAN) 0.005 % ophthalmic solution Place 1 drop into both eyes at bedtime.    [provider]  methylPREDNISolone (MEDROL DOSEPAK) 4 MG TBPK tablet Take 6 pills on day 1, 5 pills on day 2, 4 pills on day 3, 3 pills on day 4, 2 pills on day 5, and 1 pill on day 6 11/26/23   Drubel, Lillia Abed, PA-C  metoprolol tartrate (LOPRESSOR) 25 MG tablet 1 po bid 06/17/23   Zola Button, Yvonne R, DO  montelukast (SINGULAIR) 10 MG tablet Take 1 tablet (10 mg total) by mouth at bedtime. 10/03/23   Donato Schultz, DO  nystatin cream (MYCOSTATIN) Apply topically 2 (two) times daily. 06/17/23   Donato Schultz, DO  Polyethyl Glycol-Propyl Glycol (SYSTANE) 0.4-0.3 % GEL ophthalmic gel Place 1 application into both eyes.    [provider]  Respiratory Therapy Supplies (NEBULIZER AIR TUBE/PLUGS) MISC 1 Device by Does not apply route daily as needed (to use with nebulizer treatments). 11/29/23   Drubel, Lillia Abed, PA-C  Respiratory Therapy Supplies (NEBULIZER MASK ADULT) MISC 1 Device by Does not apply route daily as needed (to use with nebulizer treatments). 11/29/23   Alfredia Ferguson, PA-C  Respiratory Therapy Supplies (NEBULIZER) DEVI 1 Device by Does not apply route daily as needed (to use with nebulizer treatment). 11/29/23   Alfredia Ferguson, PA-C  sodium chloride (OCEAN) 0.65 % SOLN nasal spray Place 1 spray into both nostrils as needed for congestion.    [provider]  triamcinolone (KENALOG) 0.1 %  Apply 1 application topically 2 (two) times daily. 08/01/20   Donato Schultz, DO  Vitamin D, Ergocalciferol, (DRISDOL) 1.25 MG (50000 UNIT) CAPS capsule Take 1 capsule (50,000 Units total) by mouth every 7 (seven) days. 06/17/23   Donato Schultz, DO  Vitamin Mixture (ESTER-C PO) Take 1,000 mg by mouth daily.    [provider]    Family History Family History  Problem Relation Age of Onset   Breast cancer Mother    Heart failure Father        CHF, CABG late 30s   Breast cancer Sister    Breast cancer Maternal Aunt        two   Heart disease  Maternal Aunt    Breast cancer Paternal Aunt    Asthma Paternal Aunt    Coronary artery disease Paternal Aunt        triple CABG   Stomach cancer Paternal Uncle    Heart attack Paternal Uncle         MI in 55s   Kidney disease Maternal Grandmother    Heart attack Paternal Grandmother        MI in late 32s   Heart disease Paternal Grandfather    Pulmonary embolism Son    Deep vein thrombosis Son    Heart Problems Son        Heart attacks related to a "blood disorder"    Ovarian cancer Other        Niece x2   Breast cancer Other        niece   Diabetes Neg Hx    Stroke Neg Hx    COPD Neg Hx    Colon polyps Neg Hx    Colon cancer Neg Hx    Esophageal cancer Neg Hx    Rectal cancer Neg Hx     Social History Social History   Tobacco Use   Smoking status: Former    Current packs/day: 0.00    Average packs/day: 0.5 packs/day for 10.0 years (5.0 ttl pk-yrs)    Types: Cigarettes    Start date: 09/10/1964    Quit date: 09/10/1974    Years since quitting: 49.2   Smokeless tobacco: Never   Tobacco comments:    smoked 1966- 1976, up to 1 ppd  Vaping Use   Vaping status: Never Used  Substance Use Topics   Alcohol use: No    Alcohol/week: 0.0 standard drinks of alcohol   Drug use: No     Allergies   Cephalosporins, Gabapentin, Levofloxacin, Psyllium, Sulfonamide derivatives, Aspirin, Belladonna, Ciprofloxacin  hcl, Conjugated estrogens, Cymbopogon, Doxycycline, Flovent hfa [fluticasone], Nabumetone, Nsaids, Soybean-containing drug products, Zicam cold remedy [homeopathic products], Zocor [simvastatin], Nickel, Paba derivatives, and Prednisone   Review of Systems Review of Systems  Constitutional:  Positive for appetite change and fatigue. Negative for chills and unexpected weight change.  HENT:  Positive for congestion. Negative for ear pain and sore throat.   Respiratory:  Positive for cough, shortness of breath and wheezing.   Cardiovascular:  Positive for chest pain (Chest hurts when she coughs).     Physical Exam Triage Vital Signs ED Triage Vitals  Encounter Vitals Group     BP 11/30/23 0953 (!) 149/71     Systolic BP Percentile --      Diastolic BP Percentile --      Pulse Rate 11/30/23 0953 67     Resp 11/30/23 0953 16     Temp 11/30/23 0953 98.8 F (37.1 C)     Temp Source 11/30/23 0953 Oral     SpO2 11/30/23 0953 98 %     Weight --      Height --      Head Circumference --      Peak Flow --      Pain Score 11/30/23 0952 0     Pain Loc --      Pain Education --      Exclude from Growth Chart --    No data found.  Updated Vital Signs BP (!) 149/71 (BP Location: Right Arm)   Pulse 67   Temp 98.8 F (37.1 C) (Oral)   Resp 16   SpO2 98%   Visual  Acuity Right Eye Distance:   Left Eye Distance:   Bilateral Distance:    Right Eye Near:   Left Eye Near:    Bilateral Near:     Physical Exam Vitals and nursing note reviewed.  Constitutional:      Appearance: She is not ill-appearing.  HENT:     Head: Normocephalic.     Right Ear: Tympanic membrane and ear canal normal.     Left Ear: Tympanic membrane and ear canal normal.     Nose: No rhinorrhea.     Mouth/Throat:     Mouth: Mucous membranes are moist.     Pharynx: Oropharynx is clear.  Eyes:     Pupils: Pupils are equal, round, and reactive to light.     Comments: Bilateral injection no drainage no  ecchymosis  Cardiovascular:     Rate and Rhythm: Normal rate and regular rhythm.     Heart sounds: Normal heart sounds.  Pulmonary:     Effort: Pulmonary effort is normal.     Breath sounds: No stridor. Rhonchi (Clear with cough) present. No wheezing or rales.  Musculoskeletal:     Cervical back: Neck supple.  Lymphadenopathy:     Cervical: No cervical adenopathy.  Neurological:     Mental Status: She is alert and oriented to person, place, and time.      UC Treatments / Results  Labs (all labs ordered are listed, but only abnormal results are displayed) Labs Reviewed - No data to display  EKG   Radiology DG Chest 2 View Result Date: 11/28/2023 CLINICAL DATA:  Acute cough. EXAM: CHEST - 2 VIEW COMPARISON:  August 02, 2020. FINDINGS: The heart size and mediastinal contours are within normal limits. Both lungs are clear. Status post surgical posterior fusion of lower thoracic and lumbar spine. IMPRESSION: No active cardiopulmonary disease. Electronically Signed   By: Lupita Raider M.D.   On: 11/28/2023 15:25    Procedures Procedures (including critical care time)  Medications Ordered in UC Medications - No data to display  Initial Impression / Assessment and Plan / UC Course  I have reviewed the triage vital signs and the nursing notes.  Pertinent labs & imaging results that were available during my care of the patient were reviewed by me and considered in my medical decision making (see chart for details).     78 year old female currently on nebulizer treatments and steroids and Augmentin presents with persistent worsening productive cough.  Recent COVID and flu test were negative, recent chest ray no active cardiopulmonary disease.  Her vital signs are stable she is nontoxic-appearing she does cough frequently has rhonchi which clear with coughing.  Discussed with patient due to her allergies there are no prescription cough medicines I can give her.  Recommend she  continue over-the-counter guaifenesin and dextromethorphan, follow-up with her PCP as needed, ED for severe symptoms or concerns Final Clinical Impressions(s) / UC Diagnoses   Final diagnoses:  Acute bronchitis, unspecified organism     Discharge Instructions      Continue your current medications Follow-up with your doctor Monday   ED Prescriptions   None    I have reviewed the PDMP during this encounter.   Meliton Rattan, [place] 11/30/23 1041

## 2023-11-30 NOTE — Discharge Instructions (Signed)
 Continue your current medications Follow-up with your doctor Monday

## 2023-11-30 NOTE — ED Triage Notes (Signed)
 Pt states cough,sore throat and sinus issues for the past week.  States she saw her primary care doctor 3 times this week for the same. States she is using a nebulizer at home and is on Augmentin. States she wants something to help her with her cough especially at night.

## 2023-12-02 ENCOUNTER — Telehealth: Payer: Self-pay

## 2023-12-02 ENCOUNTER — Encounter: Payer: Self-pay | Admitting: Family Medicine

## 2023-12-02 ENCOUNTER — Encounter: Payer: Self-pay | Admitting: Physician Assistant

## 2023-12-02 NOTE — Telephone Encounter (Signed)
 See other mychart message to [place].

## 2023-12-02 NOTE — Telephone Encounter (Signed)
 Pt seen at [facility].

## 2023-12-02 NOTE — Telephone Encounter (Signed)
 Initial Comment Caller states she has been on antibiotics for three days. Pt has a bad cough, she has asthma, she did a nebulizer treatment. Pt does not know how to control her cough which kept her up all night long. Pt is having wheezing but not short of breath. Translation No Nurse Assessment Nurse: Ladona Ridgel, RN, Clydie Braun Date/Time [name] Time): 11/30/2023 6:40:31 AM Confirm and document reason for call. If symptomatic, describe symptoms. ---Caller states started last Sat with sore throat, headache; xray neg for pna and lungs were clear. She also has conjunctivitis. She was seen on Thurs has been on antibiotics Augment bid. Pt has a bad cough, she has asthma, she did a nebulizer treatment late last night but still coughed up all night. She has a prod cough of yellow-green sputum and sinus dc. No fever. Does the patient have any new or worsening symptoms? ---Yes Will a triage be completed? ---Yes Related visit to physician within the last 2 weeks? ---No Does the PT have any chronic conditions? (i.e. diabetes, asthma, this includes High risk factors for pregnancy, etc.) ---Yes List chronic conditions. ---asthma; htn Is this a behavioral health or substance abuse call? ---No Guidelines Guideline Title Affirmed Question Affirmed Notes Nurse Date/Time (Eastern Time) Cough - Acute Productive SEVERE coughing spells (e.g., whooping sound after coughing, Ladona Ridgel RN, Clydie Braun 11/30/2023 6:49:05 AM PLEASE NOTE: All timestamps contained within this report are represented as [place] Standard Time. CONFIDENTIALTY NOTICE: This fax transmission is intended only for the addressee. It contains information that is legally privileged, confidential or otherwise protected from use or disclosure. If you are not the intended recipient, you are strictly prohibited from reviewing, disclosing, copying using or disseminating any of this information or taking any action in reliance on or regarding this  information. If you have received this fax in error, please notify us immediately by telephone so that we can arrange for its return to [place]. Phone: [phone], Toll-Free: [phone], Fax: [phone] CECILIA_LISTER [birth year] Page: 2 of 2 CallId: [id number] Guidelines Guideline Title Affirmed Question Affirmed Notes Nurse Date/Time [name] Time) vomiting after coughing) Disp. Time [name] Time) Disposition Final User 11/30/2023 6:38:30 AM Send to Urgent Queue Charlesetta Shanks 11/30/2023 6:55:09 AM See PCP within 24 Hours Yes Ladona Ridgel RN, Clydie Braun Final Disposition 11/30/2023 6:55:09 AM See PCP within 24 Hours Yes Ladona Ridgel, RN, Pablo Ledger Disagree/Comply Comply Caller Understands Yes PreDisposition Did not know what to do Care Advice Given Per Guideline SEE PCP WITHIN 24 HOURS: * You become worse * Difficulty breathing occurs CALL BACK IF: Comments User: Marlene Bast, RN Date/Time [name] Time): 11/30/2023 6:49:00 AM She was also started on prednisone dose pack (today is day 5). Referrals GO TO FACILITY UNDECIDED

## 2023-12-10 ENCOUNTER — Encounter: Payer: Self-pay | Admitting: Family Medicine

## 2023-12-10 ENCOUNTER — Ambulatory Visit: Payer: Medicare Other | Admitting: Family Medicine

## 2023-12-11 ENCOUNTER — Ambulatory Visit: Admitting: Medical

## 2023-12-11 ENCOUNTER — Other Ambulatory Visit: Payer: Self-pay | Admitting: Family Medicine

## 2023-12-11 DIAGNOSIS — R053 Chronic cough: Secondary | ICD-10-CM

## 2023-12-11 MED ORDER — PROMETHAZINE-DM 6.25-15 MG/5ML PO SYRP
5.0000 mL | ORAL_SOLUTION | Freq: Four times a day (QID) | ORAL | 0 refills | Status: DC | PRN
Start: 2023-12-11 — End: 2024-02-28

## 2023-12-12 ENCOUNTER — Encounter: Payer: Self-pay | Admitting: Family Medicine

## 2023-12-12 ENCOUNTER — Telehealth: Payer: Self-pay | Admitting: Family Medicine

## 2023-12-12 ENCOUNTER — Ambulatory Visit (INDEPENDENT_AMBULATORY_CARE_PROVIDER_SITE_OTHER): Admitting: Family Medicine

## 2023-12-12 VITALS — BP 140/68 | HR 65 | Temp 99.1°F | Resp 16 | Ht 61.0 in | Wt 148.4 lb

## 2023-12-12 DIAGNOSIS — J4521 Mild intermittent asthma with (acute) exacerbation: Secondary | ICD-10-CM | POA: Diagnosis not present

## 2023-12-12 DIAGNOSIS — J069 Acute upper respiratory infection, unspecified: Secondary | ICD-10-CM | POA: Insufficient documentation

## 2023-12-12 DIAGNOSIS — J4520 Mild intermittent asthma, uncomplicated: Secondary | ICD-10-CM | POA: Diagnosis not present

## 2023-12-12 MED ORDER — AZELASTINE HCL 0.1 % NA SOLN
1.0000 | Freq: Two times a day (BID) | NASAL | 12 refills | Status: DC
Start: 2023-12-12 — End: 2024-02-28

## 2023-12-12 MED ORDER — METHYLPREDNISOLONE ACETATE 80 MG/ML IJ SUSP
80.0000 mg | Freq: Once | INTRAMUSCULAR | Status: AC
Start: 2023-12-12 — End: 2023-12-12
  Administered 2023-12-12: 80 mg via INTRAMUSCULAR

## 2023-12-12 MED ORDER — QVAR REDIHALER 40 MCG/ACT IN AERB: 2.0000 | INHALATION_SPRAY | Freq: Two times a day (BID) | RESPIRATORY_TRACT | 2 refills | Status: DC

## 2023-12-12 NOTE — Telephone Encounter (Signed)
 Patient called and she says the inhaler will cost her $200, so if she really needs it she will pay for it, but if there is an alternative she would like that sent in. She says the pharmacy will order it and have it in stock tomorrow. Advised to go ahead and tell them to order just in case Dr. Zola Button does want that inhaler, at least it will be there. Advised I will send this to the office for her to review tomorrow. Patient says she also sent a MyChart message about this.   Copied from CRM [id number]. Topic: Clinical - Prescription Issue >> Dec 12, 2023  4:59 PM Kayla [name] wrote: Reason for CRM: Patient is at Pharmacy and they advised that they dont keep  beclomethasone (QVAR REDIHALER) 40 MCG/ACT inhaler in stock. Pharmacy can order it and it will be here tomorrow but before doing that patient wants to know if it is necessary to have or if there is an alternative. She states that if it is she will put it on her credit card.

## 2023-12-12 NOTE — Assessment & Plan Note (Signed)
 Improving but still wheezing Depo medrol given  Add qvar inhaler

## 2023-12-12 NOTE — Patient Instructions (Signed)

## 2023-12-12 NOTE — Progress Notes (Signed)
 Established Patient Office Visit  Subjective   Patient ID: Kayla [name], female    DOB: 1946/08/26  Age: 78 y.o. MRN: [id number]  Chief Complaint  Patient presents with   Cough   Follow-up    HPI Discussed the use of AI scribe software for clinical note transcription with the patient, who gave verbal consent to proceed.  History of Present Illness Kayla [name] "[name]" is a 78 year old female who presents with persistent cough and congestion.  She woke up yesterday morning with congestion and a persistent, spontaneous, and uncontrollable cough that worsens when lying down. To manage these symptoms, she has been sleeping in a recliner or propped up with pillows.  She initially took Mucinex D but discontinued it after one dose due to concerns about blood pressure elevation and feeling overly dried out. She switched to plain Mucinex, which has been effective in helping her expectorate phlegm. She also uses a saline nasal spray and Nasacort, which provide some relief. She has been maintaining hydration and occasionally uses a warm lemon water remedy. She completed a course of antibiotics a few days ago, which initially helped clear yellow nasal discharge. Currently, she is expectorating mostly clear or white phlegm.  She has a history of wheezing and uses a nebulizer when necessary, particularly if she feels congested and unable to clear mucus. She takes Claritin for allergies and uses it regularly to aid with drainage. She has an adequate supply of albuterol for her nebulizer.  She has a history of adverse reactions to certain steroids, including prednisone and Solu-Medrol, but has tolerated Depo-Medrol and Medrol Dosepak in the past. She has used steroid inhalers like Symbicort and Qvar previously, but experienced voice loss with Flovent.   Patient Active Problem List   Diagnosis Date Noted   Viral upper respiratory tract infection 12/12/2023   Bilateral low back pain with  sciatica 06/12/2022   Weakness 06/12/2022   Pulmonary nodule less than 1 cm in diameter with low risk for malignant neoplasm 03/14/2021   Asthmatic bronchitis 03/09/2020   Allergies 01/18/2020   Preventative health care 03/29/2019   Pneumonia of left lower lobe due to infectious organism 09/01/2018   Vaginal atrophy 03/24/2018   Seasonal allergies 02/08/2018   Erythema nodosum 06/09/2016   Herpes zoster 09/23/2013   Superficial bruising of finger 02/16/2013   Hyperlipidemia 11/01/2011   Palpitations 05/23/2011   Essential hypertension 04/23/2011   Cough 04/09/2011   THYROID FUNCTION TEST, ABNORMAL 12/20/2009   History of colonic polyps 11/08/2008   Asthma 01/21/2008   GERD 01/21/2008   IRRITABLE BOWEL SYNDROME 01/21/2008   FIBROMYALGIA 01/21/2008   Vitamin D deficiency 04/02/2007   DEGENERATIVE DISC DISEASE 04/02/2007   GLAUCOMA, LOW TENSION 02/06/2007   Osteoporosis 02/06/2007   Past Medical History:  Diagnosis Date   Abscess of left groin 11/10/2014   Allergy    Arthritis    Asthma    Cataract 2015   Very mild   Cellulitis 10/20/2014   Diverticulitis    Diverticulosis    Fasting hyperglycemia 04/2011   FBS 108   Fibromyalgia    Fundic gland polyps of stomach, benign    GERD (gastroesophageal reflux disease)    gastric polyp x3   Glaucoma     Dr Hazle Quant   Hypertension    IBS (irritable bowel syndrome)    Osteopenia    last 07/2011   Pneumonia     OP as child   Shingles    Past Surgical  History:  Procedure Laterality Date   COLONOSCOPY W/ POLYPECTOMY  [phone]   last  colonoscopy 2007, Dr Leone Payor   DILATION AND CURETTAGE OF UTERUS     ESOPHAGOGASTRODUODENOSCOPY     SPINE SURGERY     T9-L5 fusions   Social History   Tobacco Use   Smoking status: Former    Current packs/day: 0.00    Average packs/day: 0.5 packs/day for 10.0 years (5.0 ttl pk-yrs)    Types: Cigarettes    Start date: 09/10/1964    Quit date: 09/10/1974    Years since quitting: 49.2    Smokeless tobacco: Never   Tobacco comments:    smoked 1966- 1976, up to 1 ppd  Vaping Use   Vaping status: Never Used  Substance Use Topics   Alcohol use: No    Alcohol/week: 0.0 standard drinks of alcohol   Drug use: No   Social History   Socioeconomic History   Marital status: Widowed    Spouse name: Not on file   Number of children: Not on file   Years of education: Not on file   Highest education level: Some college, no degree  Occupational History   Occupation: retired  Tobacco Use   Smoking status: Former    Current packs/day: 0.00    Average packs/day: 0.5 packs/day for 10.0 years (5.0 ttl pk-yrs)    Types: Cigarettes    Start date: 09/10/1964    Quit date: 09/10/1974    Years since quitting: 49.2   Smokeless tobacco: Never   Tobacco comments:    smoked 1966- 1976, up to 1 ppd  Vaping Use   Vaping status: Never Used  Substance and Sexual Activity   Alcohol use: No    Alcohol/week: 0.0 standard drinks of alcohol   Drug use: No   Sexual activity: Not Currently  Other Topics Concern   Not on file  Social History Narrative   Daily caffeine 3 cups   Regular exercise   Married         Social Drivers of Health   Financial Resource Strain: Low Risk  (09/25/2023)   Overall Financial Resource Strain (CARDIA)    Difficulty of Paying Living Expenses: Not very hard  Food Insecurity: No Food Insecurity (09/25/2023)   Hunger Vital Sign    Worried About Running Out of Food in the Last Year: Never true    Ran Out of Food in the Last Year: Never true  Transportation Needs: No Transportation Needs (09/25/2023)   PRAPARE - Administrator, Civil Service (Medical): No    Lack of Transportation (Non-Medical): No  Physical Activity: Sufficiently Active (09/25/2023)   Exercise Vital Sign    Days of Exercise per Week: 5 days    Minutes of Exercise per Session: 40 min  Stress: No Stress Concern Present (09/25/2023)   [employer] of Occupational Health -  Occupational Stress Questionnaire    Feeling of Stress : Only a little  Social Connections: Moderately Integrated (09/25/2023)   Social Connection and Isolation Panel [NHANES]    Frequency of Communication with Friends and Family: More than three times a week    Frequency of Social Gatherings with Friends and Family: Once a week    Attends Religious Services: More than 4 times per year    Active Member of [employer] or Organizations: Yes    Attends Banker Meetings: More than 4 times per year    Marital Status: Widowed  Intimate Partner Violence: Not At Risk (  02/14/2023)   Humiliation, Afraid, Rape, and Kick questionnaire    Fear of Current or Ex-Partner: No    Emotionally Abused: No    Physically Abused: No    Sexually Abused: No   Family Status  Relation Name Status   Mother  Deceased   Father  Deceased   Sister  (Not Specified)   Mat Aunt  (Not Specified)   Mat Aunt  (Not Specified)   Oceanographer  (Not Specified)   Emelda Brothers  (Not Specified)   Oneal Grout  (Not Specified)   MGM  (Not Specified)   PGM  (Not Specified)   PGF  (Not Specified)   Son  (Not Specified)   Other  (Not Specified)   Other  (Not Specified)   Neg Hx  (Not Specified)  No partnership data on file   Family History  Problem Relation Age of Onset   Breast cancer Mother    Heart failure Father        CHF, CABG late 87s   Breast cancer Sister    Breast cancer Maternal Aunt        two   Heart disease Maternal Aunt    Breast cancer Paternal Aunt    Asthma Paternal Aunt    Coronary artery disease Paternal Aunt        triple CABG   Stomach cancer Paternal Uncle    Heart attack Paternal Uncle         MI in 50s   Kidney disease Maternal Grandmother    Heart attack Paternal Grandmother        MI in late 28s   Heart disease Paternal Grandfather    Pulmonary embolism Son    Deep vein thrombosis Son    Heart Problems Son        Heart attacks related to a "blood disorder"    Ovarian cancer Other         Niece x2   Breast cancer Other        niece   Diabetes Neg Hx    Stroke Neg Hx    COPD Neg Hx    Colon polyps Neg Hx    Colon cancer Neg Hx    Esophageal cancer Neg Hx    Rectal cancer Neg Hx    Allergies  Allergen Reactions   Cephalosporins     Blisters  orally   Gabapentin     REACTION: rash   Levofloxacin     REACTION: stomach ache and rash   Psyllium     REACTION: rash   Sulfonamide Derivatives Anaphylaxis    REACTION: shock, urticaria   Aspirin     REACTION: stomach pain   Belladonna    Ciprofloxacin Hcl    Conjugated Estrogens    Cymbopogon    Doxycycline     Abdominal pain   Flovent Hfa [Fluticasone]    Nabumetone    Nsaids    Soybean-Containing Drug Products Other (See Comments)    GI upset   Zicam Cold Remedy [Homeopathic Products]    Zocor [Simvastatin] Itching   Nickel Itching and Rash   Paba Derivatives Itching and Rash   Prednisone Rash    NO PROBLEM WITH MEDROL DOSE PAK Oral prednisone caused facial burning, "made my face feel like it was on fire"      Review of Systems  Constitutional:  Negative for fever and malaise/fatigue.  HENT:  Negative for congestion.   Eyes:  Negative for blurred vision.  Respiratory:  Positive for cough and wheezing. Negative for shortness of breath.   Cardiovascular:  Negative for chest pain, palpitations and leg swelling.  Gastrointestinal:  Negative for vomiting.  Musculoskeletal:  Negative for back pain.  Skin:  Negative for rash.  Neurological:  Negative for loss of consciousness and headaches.      Objective:     BP (!) 140/68 (BP Location: Left Arm, Patient Position: Sitting, Cuff Size: Normal)   Pulse 65   Temp 99.1 F (37.3 C) (Oral)   Resp 16   Ht 5\' 1"  (1.549 m)   Wt 148 lb 6.4 oz (67.3 kg)   SpO2 100%   BMI 28.04 kg/m  BP Readings from Last 3 Encounters:  12/12/23 (!) 140/68  11/30/23 (!) 149/71  11/28/23 (!) 151/77   Wt Readings from Last 3 Encounters:  12/12/23 148 lb 6.4 oz (67.3 kg)   11/28/23 149 lb (67.6 kg)  11/26/23 147 lb 12.8 oz (67 kg)   SpO2 Readings from Last 3 Encounters:  12/12/23 100%  11/30/23 98%  11/28/23 99%      Physical Exam Vitals and nursing note reviewed.  Constitutional:      General: She is not in acute distress.    Appearance: Normal appearance. She is well-developed.  HENT:     Head: Normocephalic and atraumatic.  Eyes:     General: No scleral icterus.       Right eye: No discharge.        Left eye: No discharge.  Cardiovascular:     Rate and Rhythm: Normal rate and regular rhythm.     Heart sounds: No murmur heard. Pulmonary:     Effort: Pulmonary effort is normal. No respiratory distress.     Breath sounds: Wheezing present.  Musculoskeletal:        General: Normal range of motion.     Cervical back: Normal range of motion and neck supple.     Right lower leg: No edema.     Left lower leg: No edema.  Skin:    General: Skin is warm and dry.  Neurological:     Mental Status: She is alert and oriented to person, place, and time.  Psychiatric:        Mood and Affect: Mood normal.        Behavior: Behavior normal.        Thought Content: Thought content normal.        Judgment: Judgment normal.      No results found for any visits on 12/12/23.  Last CBC Lab Results  Component Value Date   WBC 4.5 06/17/2023   HGB 13.3 06/17/2023   HCT 40.4 06/17/2023   MCV 93.3 06/17/2023   MCH 30.6 06/09/2022   RDW 14.0 06/17/2023   PLT 164.0 06/17/2023   Last metabolic panel Lab Results  Component Value Date   GLUCOSE 80 07/05/2023   NA 138 07/05/2023   K 4.3 07/05/2023   CL 102 07/05/2023   CO2 29 07/05/2023   BUN 13 07/05/2023   CREATININE 0.69 07/05/2023   GFR 83.47 07/05/2023   CALCIUM 9.5 07/05/2023   PROT 6.9 07/05/2023   ALBUMIN 4.5 07/05/2023   BILITOT 0.7 07/05/2023   ALKPHOS 93 07/05/2023   AST 19 07/05/2023   ALT 13 07/05/2023   ANIONGAP 9 06/09/2022   Last thyroid functions Lab Results  Component  Value Date   TSH 2.21 06/17/2023      The 10-year ASCVD risk score (Arnett DK, et  al., 2019) is: 33.9%    Assessment & Plan:   Problem List Items Addressed This Visit       Unprioritized   Asthma   Relevant Medications   beclomethasone (QVAR REDIHALER) 40 MCG/ACT inhaler   Viral upper respiratory tract infection - Primary   Relevant Medications   azelastine (ASTELIN) 0.1 % nasal spray   Asthmatic bronchitis   Improving but still wheezing Depo medrol given  Add qvar inhaler        Relevant Medications   beclomethasone (QVAR REDIHALER) 40 MCG/ACT inhaler  Assessment and Plan Assessment & Plan Chronic Cough with Wheezing   She presents with a chronic, spasmodic cough that worsens at night, accompanied by wheezing and occasional phlegm production, raising concerns for asthma exacerbation. Nasal congestion and post-nasal drip may contribute to the cough. She has adverse reactions to certain steroids but tolerates Medrol Dosepak. Mucinex D is not recommended due to potential hypertension. Use cough syrup at night and take plain Mucinex or Mucinex DM during the day. If wheezing persists after coughing, use a nebulizer. Prescribe Qvar inhaler and instruct her to rinse her mouth after use. Administer a steroid shot to clear up symptoms. Use Claritin regularly to help with post-nasal drip and prescribe Astelin nasal spray to use twice a day with Nasacort.  Asthma   She is experiencing wheezing, indicating possible asthma exacerbation. She uses nebulizers and inhalers and tolerates Medrol Dosepak and Depo-Medrol. Discussed Qvar inhaler use and rinsing mouth post-use to prevent thrush. Use nebulizer as needed for wheezing. Prescribe Qvar inhaler and ensure an adequate supply of albuterol inhaler.  Nasal Congestion and Post-Nasal Drip   Nasal congestion and post-nasal drip may contribute to the chronic cough. Saline nasal spray and Nasacort provide some relief. Claritin and Astelin nasal  spray were discussed for additional management. Continue using saline nasal spray and Nasacort. Use Astelin nasal spray twice a day and take Claritin regularly.    No follow-ups on file.    Donato Schultz, DO

## 2023-12-13 ENCOUNTER — Other Ambulatory Visit: Payer: Self-pay | Admitting: Family Medicine

## 2023-12-13 MED ORDER — FLUTICASONE PROPIONATE HFA 110 MCG/ACT IN AERO: 2.0000 | INHALATION_SPRAY | Freq: Two times a day (BID) | RESPIRATORY_TRACT | 12 refills | Status: DC

## 2023-12-24 ENCOUNTER — Encounter: Payer: Self-pay | Admitting: Family Medicine

## 2023-12-24 ENCOUNTER — Telehealth: Payer: Self-pay | Admitting: Neurology

## 2023-12-24 ENCOUNTER — Other Ambulatory Visit: Payer: Self-pay | Admitting: *Deleted

## 2023-12-24 NOTE — Telephone Encounter (Signed)
 Forms sent to [facility] for new neb

## 2023-12-24 NOTE — Telephone Encounter (Signed)
 Copied from CRM [id number]. Topic: General - Other >> Dec 24, 2023  1:39 PM Kayla [name] wrote: Reason for CRM: Patient called in due to her nebulizer having an odor and was advised by the company to warn her provider if it had any odor. Needs a new nebulizer machine. Spoke with front desk and advised nurse Pattie Borders will send a new machine in, patient would like to have a message sent to her mychart on where it will be sent to.

## 2023-12-26 ENCOUNTER — Telehealth: Payer: Self-pay

## 2023-12-26 DIAGNOSIS — R053 Chronic cough: Secondary | ICD-10-CM

## 2023-12-26 DIAGNOSIS — J4521 Mild intermittent asthma with (acute) exacerbation: Secondary | ICD-10-CM

## 2023-12-26 NOTE — Telephone Encounter (Signed)
 Copied from CRM [id number]. Topic: Clinical - Medication Question >> Dec 26, 2023  4:24 PM Aisha D wrote: Reason for CRM: Amy with [facility] stated that there was an order put in for nebulizer for the patient. Amy stated that the insurance is requiring that the order says nebulizer administrative kit and filters in order to be approved by them. Amy stated that the patient is there to pick it up but is unable to until the order is resubmitted with the correct wording.

## 2023-12-26 NOTE — Addendum Note (Signed)
 Addended by: Ysidro Her on: 12/26/2023 04:55 PM   Modules accepted: Orders

## 2023-12-31 ENCOUNTER — Encounter: Payer: Self-pay | Admitting: Family Medicine

## 2023-12-31 ENCOUNTER — Ambulatory Visit (INDEPENDENT_AMBULATORY_CARE_PROVIDER_SITE_OTHER): Admitting: Family Medicine

## 2023-12-31 VITALS — BP 134/74 | HR 51 | Temp 98.9°F | Resp 18 | Ht 61.0 in | Wt 144.8 lb

## 2023-12-31 DIAGNOSIS — I10 Essential (primary) hypertension: Secondary | ICD-10-CM | POA: Diagnosis not present

## 2023-12-31 DIAGNOSIS — E559 Vitamin D deficiency, unspecified: Secondary | ICD-10-CM | POA: Diagnosis not present

## 2023-12-31 DIAGNOSIS — E875 Hyperkalemia: Secondary | ICD-10-CM

## 2023-12-31 DIAGNOSIS — T782XXS Anaphylactic shock, unspecified, sequela: Secondary | ICD-10-CM | POA: Diagnosis not present

## 2023-12-31 DIAGNOSIS — E785 Hyperlipidemia, unspecified: Secondary | ICD-10-CM | POA: Diagnosis not present

## 2023-12-31 LAB — CBC WITH DIFFERENTIAL/PLATELET
Basophils Absolute: 0 10*3/uL (ref 0.0–0.1)
Basophils Relative: 0.5 % (ref 0.0–3.0)
Eosinophils Absolute: 0.1 10*3/uL (ref 0.0–0.7)
Eosinophils Relative: 1.4 % (ref 0.0–5.0)
HCT: 41.2 % (ref 36.0–46.0)
Hemoglobin: 13.7 g/dL (ref 12.0–15.0)
Lymphocytes Relative: 10.6 % — ABNORMAL LOW (ref 12.0–46.0)
Lymphs Abs: 0.7 10*3/uL (ref 0.7–4.0)
MCHC: 33.2 g/dL (ref 30.0–36.0)
MCV: 93.6 fl (ref 78.0–100.0)
Monocytes Absolute: 0.6 10*3/uL (ref 0.1–1.0)
Monocytes Relative: 8.3 % (ref 3.0–12.0)
Neutro Abs: 5.3 10*3/uL (ref 1.4–7.7)
Neutrophils Relative %: 79.2 % — ABNORMAL HIGH (ref 43.0–77.0)
Platelets: 175 10*3/uL (ref 150.0–400.0)
RBC: 4.4 Mil/uL (ref 3.87–5.11)
RDW: 15 % (ref 11.5–15.5)
WBC: 6.7 10*3/uL (ref 4.0–10.5)

## 2023-12-31 LAB — COMPREHENSIVE METABOLIC PANEL WITH GFR
ALT: 16 U/L (ref 0–35)
AST: 20 U/L (ref 0–37)
Albumin: 4.5 g/dL (ref 3.5–5.2)
Alkaline Phosphatase: 97 U/L (ref 39–117)
BUN: 16 mg/dL (ref 6–23)
CO2: 29 meq/L (ref 19–32)
Calcium: 9.3 mg/dL (ref 8.4–10.5)
Chloride: 100 meq/L (ref 96–112)
Creatinine, Ser: 0.65 mg/dL (ref 0.40–1.20)
GFR: 84.39 mL/min (ref >60.00)
Glucose, Bld: 90 mg/dL (ref 70–99)
Potassium: 4.2 meq/L (ref 3.5–5.1)
Sodium: 136 meq/L (ref 135–145)
Total Bilirubin: 0.7 mg/dL (ref 0.2–1.2)
Total Protein: 6.9 g/dL (ref 6.0–8.3)

## 2023-12-31 LAB — VITAMIN D 25 HYDROXY (VIT D DEFICIENCY, FRACTURES): VITD: 42.6 ng/mL (ref 30.00–100.00)

## 2023-12-31 MED ORDER — EPINEPHRINE 0.3 MG/0.3ML IJ SOAJ: INTRAMUSCULAR | 0 refills | Status: AC

## 2023-12-31 NOTE — Patient Instructions (Signed)
Hyperkalemia Hyperkalemia occurs when the level of potassium in the blood is too high. Potassium is an important mineral (electrolyte) that helps the muscles and nerves function normally. It affects how the heart works, and it helps keep fluids and minerals balanced in the body. If there is too much potassium in your blood, it can affect your heart's ability to function normally. Potassium is normally removed (excreted) from the body by the kidneys. Hyperkalemia can result from various conditions. It can range from mild to severe. What are the causes? This condition may be caused by: Taking in too much potassium. This can happen if: You use salt substitutes. They contain large amounts of potassium. You take potassium supplements. You eat too many foods that are high in potassium if you have kidney disease. Excreting too little potassium. This can happen if: Your kidneys are not working properly. Kidney (renal) disease, including short-term or long-term renal failure, is a common cause of hyperkalemia. You are taking medicines that lower your excretion of potassium, such as ACE inhibitors, angiotensin II receptor blockers (ARBs), or potassium-sparing diuretics, such as spironolactone. You have Addison's disease. You have a urinary tract blockage, such as kidney stones. You are on treatment to mechanically clean your blood (dialysis) and you skip a treatment. Your cells releasing a high amount of potassium into the blood. This can happen with: Injury to muscles (rhabdomyolysis) or other tissues. Most potassium is stored in your muscles. Severe burns, injuries, or infections. Acidic blood plasma (acidosis). Acidosis can result from many diseases, such as uncontrolled diabetes. What increases the risk? You are more likely to develop this condition if you have alcoholism or if you use drugs heavily. What are the signs or symptoms? In many cases, there are no symptoms. However, when your potassium  level becomes high enough, you may have symptoms such as: An irregular or very slow heartbeat. Nausea. Tiredness (fatigue). Confusion. Tingling of your skin or numbness of your hands or feet. Muscle cramps. Muscle weakness. Not being able to move (paralysis). How is this diagnosed? This condition may be diagnosed based on: Your symptoms and medical history. Your health care provider will ask about your use of over-the-counter and prescription medicines. A physical exam. Blood tests. An electrocardiogram (ECG). How is this treated? Treatment depends on the cause and severity of your condition. Treatment may need to be done in the hospital setting. Treatment may include: Receiving a sugar solution (glucose) through an IV along with insulin to shift potassium out of your blood and into your cells. Taking a medicine called albuterol to shift potassium out of your blood and into your cells. Taking medicines to remove the potassium from your body. Having dialysis to remove the potassium from your body. Taking calcium to protect your heart from the effects of high potassium, such as irregular rhythms (arrhythmias). Follow these instructions at home:  Take over-the-counter and prescription medicines only as told by your health care provider. Do not take any supplements, natural food products, herbs, or vitamins without reviewing them with your health care provider. Certain supplements and natural food products contain high amounts of potassium. If you drink alcohol, limit how much you have as told by your health care provider. Do not use illegal drugs. If you need help quitting, ask your health care provider. If you have kidney disease, you may need to follow a low-potassium diet. A dietitian can help you learn which foods have high or low amounts of potassium. Keep all follow-up visits. This is important.  Contact a health care provider if: You have an irregular or very slow heartbeat. You  feel light-headed. You feel weak. You are nauseous. You have tingling or numbness in your hands or feet. Get help right away if: You have shortness of breath. You have chest pain or discomfort. You faint. You have muscle paralysis. These symptoms may be an emergency. Get help right away. Call 911. Do not wait to see if the symptoms will go away. Do not drive yourself to the hospital. Summary Hyperkalemia occurs when the level of potassium in your blood is too high. This condition may be caused by taking in too much potassium, excreting too little potassium, or releasing a high amount of potassium from your cells into your blood. Hyperkalemia can result from many underlying conditions, especially chronic kidney disease, or from taking certain medicines. Treatment of hyperkalemia may include medicine to shift potassium out of your blood and into your cells or to remove the potassium from your body. If you have kidney disease, you may need to follow a low-potassium diet. A dietitian can help you learn which foods have high or low amounts of potassium. This information is not intended to replace advice given to you by your health care provider. Make sure you discuss any questions you have with your health care provider. Document Revised: 05/11/2021 Document Reviewed: 05/11/2021 Elsevier Patient Education  2024 [employer].

## 2023-12-31 NOTE — Assessment & Plan Note (Signed)
 Well controlled, no changes to meds. Encouraged heart healthy diet such as the DASH diet and exercise as tolerated.

## 2023-12-31 NOTE — Progress Notes (Addendum)
 Established Patient Office Visit  Subjective   Patient ID: Kayla [name], female    DOB: [birth year]  Age: 78 y.o. MRN: [id number]  Chief Complaint  Patient presents with   Hypertension   Follow-up    HPI Discussed the use of AI scribe software for clinical note transcription with the patient, who gave verbal consent to proceed.  History of Present Illness Kayla [name] "Kayla [name]" is a 78 year old female who presents for a follow-up visit regarding medication management and recent health concerns.  She has experienced significant improvement in her overall health but continues to have issues with her voice, which she hopes will return to normal. She uses Qvar  inhaler, taking two inhalations in the morning and two in the evening, which she finds helpful. She is considering tapering the dose as her symptoms improve.  She inquires about the use of Astelin , an antihistamine, and its over-the-counter equivalent, Astepro , for cost considerations. She is also using Nasacort , a steroid, and understands the difference in its mechanisms.  She describes a skin issue on her leg that was injured at the gym. The wound healed but occasionally becomes itchy and slightly pink. She plans to document the flare-ups with photos for further evaluation. She has a history of erythema nodosum and wonders if her current skin symptoms are related. She plans to follow up with Dr. Peggye Bowers, who treated her for this condition.  She has not been able to attend the gym for the past month due to recent health issues but remains active at home. She previously attended the gym three times a week to manage her cholesterol and overall health. She is cautious about her potassium intake, having previously been on a high-dose vitamin D  regimen. She monitors her diet, including potassium-rich foods like avocado and milk, and has adjusted her intake to maintain normal potassium levels.     Patient Active Problem List    Diagnosis Date Noted   Viral upper respiratory tract infection 12/12/2023   Bilateral low back pain with sciatica 06/12/2022   Weakness 06/12/2022   Pulmonary nodule less than 1 cm in diameter with low risk for malignant neoplasm 03/14/2021   Asthmatic bronchitis 03/09/2020   Allergies 01/18/2020   Preventative health care 03/29/2019   Pneumonia of left lower lobe due to infectious organism 09/01/2018   Vaginal atrophy 03/24/2018   Seasonal allergies 02/08/2018   Erythema nodosum 06/09/2016   Herpes zoster 09/23/2013   Superficial bruising of finger 02/16/2013   Hyperlipidemia 11/01/2011   Palpitations 05/23/2011   Essential hypertension 04/23/2011   Cough 04/09/2011   THYROID  FUNCTION TEST, ABNORMAL 12/20/2009   History of colonic polyps 11/08/2008   Asthma 01/21/2008   GERD 01/21/2008   IRRITABLE BOWEL SYNDROME 01/21/2008   FIBROMYALGIA 01/21/2008   Vitamin D  deficiency 04/02/2007   DEGENERATIVE DISC DISEASE 04/02/2007   GLAUCOMA, LOW TENSION 02/06/2007   Osteoporosis 02/06/2007   Past Medical History:  Diagnosis Date   Abscess of left groin 11/10/2014   Allergy     Arthritis    Asthma    Cataract 2015   Very mild   Cellulitis 10/20/2014   Diverticulitis    Diverticulosis    Fasting hyperglycemia 04/2011   FBS 108   Fibromyalgia    Fundic gland polyps of stomach, benign    GERD (gastroesophageal reflux disease)    gastric polyp x3   Glaucoma     Dr Danley Dusky   Hypertension    IBS (irritable bowel syndrome)  Osteopenia    last 07/2011   Pneumonia     OP as child   Shingles    Past Surgical History:  Procedure Laterality Date   COLONOSCOPY W/ POLYPECTOMY  1993,2002,2007   last  colonoscopy 2007, Dr Willy Harvest   DILATION AND CURETTAGE OF UTERUS     ESOPHAGOGASTRODUODENOSCOPY     SPINE SURGERY     T9-L5 fusions   Social History   Tobacco Use   Smoking status: Former    Current packs/day: 0.00    Average packs/day: 0.5 packs/day for 10.0 years (5.0 ttl  pk-yrs)    Types: Cigarettes    Start date: 09/10/1964    Quit date: 09/10/1974    Years since quitting: 49.3   Smokeless tobacco: Never   Tobacco comments:    smoked 1966- 1976, up to 1 ppd  Vaping Use   Vaping status: Never Used  Substance Use Topics   Alcohol use: No    Alcohol/week: 0.0 standard drinks of alcohol   Drug use: No   Social History   Socioeconomic History   Marital status: Widowed    Spouse name: Not on file   Number of children: Not on file   Years of education: Not on file   Highest education level: Some college, no degree  Occupational History   Occupation: retired  Tobacco Use   Smoking status: Former    Current packs/day: 0.00    Average packs/day: 0.5 packs/day for 10.0 years (5.0 ttl pk-yrs)    Types: Cigarettes    Start date: 09/10/1964    Quit date: 09/10/1974    Years since quitting: 49.3   Smokeless tobacco: Never   Tobacco comments:    smoked 1966- 1976, up to 1 ppd  Vaping Use   Vaping status: Never Used  Substance and Sexual Activity   Alcohol use: No    Alcohol/week: 0.0 standard drinks of alcohol   Drug use: No   Sexual activity: Not Currently  Other Topics Concern   Not on file  Social History Narrative   Daily caffeine 3 cups   Regular exercise   Married         Social Drivers of Health   Financial Resource Strain: Low Risk  (09/25/2023)   Overall Financial Resource Strain (CARDIA)    Difficulty of Paying Living Expenses: Not very hard  Food Insecurity: No Food Insecurity (09/25/2023)   Hunger Vital Sign    Worried About Running Out of Food in the Last Year: Never true    Ran Out of Food in the Last Year: Never true  Transportation Needs: No Transportation Needs (09/25/2023)   PRAPARE - Administrator, Civil Service (Medical): No    Lack of Transportation (Non-Medical): No  Physical Activity: Sufficiently Active (09/25/2023)   Exercise Vital Sign    Days of Exercise per Week: 5 days    Minutes of Exercise per  Session: 40 min  Stress: No Stress Concern Present (09/25/2023)   [employer] of Occupational Health - Occupational Stress Questionnaire    Feeling of Stress : Only a little  Social Connections: Moderately Integrated (09/25/2023)   Social Connection and Isolation Panel [NHANES]    Frequency of Communication with Friends and Family: More than three times a week    Frequency of Social Gatherings with Friends and Family: Once a week    Attends Religious Services: More than 4 times per year    Active Member of [employer] or Organizations: Yes  Attends Banker Meetings: More than 4 times per year    Marital Status: Widowed  Intimate Partner Violence: Not At Risk (02/14/2023)   Humiliation, Afraid, Rape, and Kick questionnaire    Fear of Current or Ex-Partner: No    Emotionally Abused: No    Physically Abused: No    Sexually Abused: No   Family Status  Relation Name Status   Mother  Deceased   Father  Deceased   Sister  (Not Specified)   Mat Aunt  (Not Specified)   Mat Aunt  (Not Specified)   Oceanographer  (Not Specified)   Bernarda Bride  (Not Specified)   Ala Alice  (Not Specified)   MGM  (Not Specified)   PGM  (Not Specified)   PGF  (Not Specified)   Son  (Not Specified)   Other  (Not Specified)   Other  (Not Specified)   Neg Hx  (Not Specified)  No partnership data on file   Family History  Problem Relation Age of Onset   Breast cancer Mother    Heart failure Father        CHF, CABG late 74s   Breast cancer Sister    Breast cancer Maternal Aunt        two   Heart disease Maternal Aunt    Breast cancer Paternal Aunt    Asthma Paternal Aunt    Coronary artery disease Paternal Aunt        triple CABG   Stomach cancer Paternal Uncle    Heart attack Paternal Uncle         MI in 24s   Kidney disease Maternal Grandmother    Heart attack Paternal Grandmother        MI in late 54s   Heart disease Paternal Grandfather    Pulmonary embolism Son    Deep vein thrombosis  Son    Heart Problems Son        Heart attacks related to a "blood disorder"    Ovarian cancer Other        Niece x2   Breast cancer Other        niece   Diabetes Neg Hx    Stroke Neg Hx    COPD Neg Hx    Colon polyps Neg Hx    Colon cancer Neg Hx    Esophageal cancer Neg Hx    Rectal cancer Neg Hx    Allergies  Allergen Reactions   Cephalosporins     Blisters  orally   Gabapentin     REACTION: rash   Levofloxacin     REACTION: stomach ache and rash   Psyllium     REACTION: rash   Sulfonamide Derivatives Anaphylaxis    REACTION: shock, urticaria   Aspirin     REACTION: stomach pain   Belladonna    Ciprofloxacin Hcl    Conjugated Estrogens    Cymbopogon    Doxycycline      Abdominal pain   Flovent  Hfa [Fluticasone ]    Nabumetone    Nsaids    Soybean-Containing Drug Products Other (See Comments)    GI upset   Zicam Cold Remedy [Homeopathic Products]    Zocor  [Simvastatin ] Itching   Nickel Itching and Rash   Paba Derivatives Itching and Rash   Prednisone  Rash    NO PROBLEM WITH MEDROL  DOSE PAK Oral prednisone  caused facial burning, "made my face feel like it was on fire"      ROS  Objective:     BP 134/74 (BP Location: Left Arm, Patient Position: Sitting, Cuff Size: Normal)   Pulse (!) 51   Temp 98.9 F (37.2 C) (Oral)   Resp 18   Ht 5\' 1"  (1.549 m)   Wt 144 lb 12.8 oz (65.7 kg)   SpO2 98%   BMI 27.36 kg/m  BP Readings from Last 3 Encounters:  12/31/23 134/74  12/12/23 (!) 140/68  11/30/23 (!) 149/71   Wt Readings from Last 3 Encounters:  12/31/23 144 lb 12.8 oz (65.7 kg)  12/12/23 148 lb 6.4 oz (67.3 kg)  11/28/23 149 lb (67.6 kg)   SpO2 Readings from Last 3 Encounters:  12/31/23 98%  12/12/23 100%  11/30/23 98%      Physical Exam   No results found for any visits on 12/31/23.  Last CBC Lab Results  Component Value Date   WBC 4.5 06/17/2023   HGB 13.3 06/17/2023   HCT 40.4 06/17/2023   MCV 93.3 06/17/2023   MCH 30.6  06/09/2022   RDW 14.0 06/17/2023   PLT 164.0 06/17/2023   Last metabolic panel Lab Results  Component Value Date   GLUCOSE 80 07/05/2023   NA 138 07/05/2023   K 4.3 07/05/2023   CL 102 07/05/2023   CO2 29 07/05/2023   BUN 13 07/05/2023   CREATININE 0.69 07/05/2023   GFR 83.47 07/05/2023   CALCIUM  9.5 07/05/2023   PROT 6.9 07/05/2023   ALBUMIN 4.5 07/05/2023   BILITOT 0.7 07/05/2023   ALKPHOS 93 07/05/2023   AST 19 07/05/2023   ALT 13 07/05/2023   ANIONGAP 9 06/09/2022   Last lipids Lab Results  Component Value Date   CHOL 224 (H) 06/17/2023   HDL 74.80 06/17/2023   LDLCALC 133 (H) 06/17/2023   LDLDIRECT 153.3 12/18/2012   TRIG 82.0 06/17/2023   CHOLHDL 3 06/17/2023   Last hemoglobin A1c Lab Results  Component Value Date   HGBA1C 5.4 11/01/2011   Last thyroid  functions Lab Results  Component Value Date   TSH 2.21 06/17/2023   Last vitamin D  Lab Results  Component Value Date   VD25OH 41.26 06/17/2023   Last vitamin B12 and Folate Lab Results  Component Value Date   VITAMINB12 271 06/12/2022   FOLATE 17.0 11/01/2006      The 10-year ASCVD risk score (Arnett DK, et al., 2019) is: 31.6%    Assessment & Plan:   Problem List Items Addressed This Visit       Unprioritized   Vitamin D  deficiency   Relevant Orders   VITAMIN D  25 Hydroxy (Vit-D Deficiency, Fractures)   Hyperlipidemia   Encourage heart healthy diet such as MIND or DASH diet, increase exercise, avoid trans fats, simple carbohydrates and processed foods, consider a krill or fish or flaxseed oil cap daily.        Relevant Medications   EPINEPHrine  (EPIPEN  2-PAK) 0.3 mg/0.3 mL IJ SOAJ injection   Essential hypertension - Primary   Well controlled, no changes to meds. Encouraged heart healthy diet such as the DASH diet and exercise as tolerated.        Relevant Medications   EPINEPHrine  (EPIPEN  2-PAK) 0.3 mg/0.3 mL IJ SOAJ injection   Other Relevant Orders   CBC with  Differential/Platelet   Other Visit Diagnoses       Anaphylactic reaction, sequela       Relevant Medications   EPINEPHrine  (EPIPEN  2-PAK) 0.3 mg/0.3 mL IJ SOAJ injection     Hyperkalemia  Relevant Orders   Comprehensive metabolic panel with GFR     Assessment and Plan Assessment & Plan Asthma   Asthma is managed with a Qvar  inhaler. She reports improvement with the current regimen of two inhalations in the morning and two in the evening. There is a plan to taper off Qvar  if symptoms improve, with a gradual reduction to once a day before stopping. It is important to rinse her mouth after use to prevent oral thrush. Refill the Qvar  inhaler and consider tapering to once a day if symptoms improve.  Erythema nodosum   She has erythema nodosum with residual skin changes, occasional itching, and pink discoloration at the site of a previous injury. She is advised to document flare-ups with photos for further evaluation, as there is a possible connection to erythema nodosum. Send photos via MyChart and discuss symptoms with Dr. Peggye Bowers during follow-up.  Back pain post-surgery   She experiences occasional back pain post-surgery. She is advised to resume gym activities gradually after a period of inactivity due to recent illness, starting slowly to avoid exacerbation of symptoms.  Wellness Visit   During the routine wellness visit, her blood pressure is well-managed with no leg swelling. Labs, including vitamin D  and potassium levels, will be checked today. There is a discussion on maintaining activity levels and dietary adjustments to manage potassium intake. She remains active at home despite not attending the gym due to recent illness.    Return if symptoms worsen or fail to improve.    Blimy Napoleon R Lowne Chase, DO

## 2023-12-31 NOTE — Assessment & Plan Note (Signed)
 Encourage heart healthy diet such as MIND or DASH diet, increase exercise, avoid trans fats, simple carbohydrates and processed foods, consider a krill or fish or flaxseed oil cap daily.

## 2024-01-01 ENCOUNTER — Encounter: Payer: Self-pay | Admitting: Family Medicine

## 2024-01-02 DIAGNOSIS — H04123 Dry eye syndrome of bilateral lacrimal glands: Secondary | ICD-10-CM | POA: Diagnosis not present

## 2024-02-06 DIAGNOSIS — L52 Erythema nodosum: Secondary | ICD-10-CM | POA: Diagnosis not present

## 2024-02-06 DIAGNOSIS — R768 Other specified abnormal immunological findings in serum: Secondary | ICD-10-CM | POA: Diagnosis not present

## 2024-02-06 DIAGNOSIS — M1991 Primary osteoarthritis, unspecified site: Secondary | ICD-10-CM | POA: Diagnosis not present

## 2024-02-06 DIAGNOSIS — M797 Fibromyalgia: Secondary | ICD-10-CM | POA: Diagnosis not present

## 2024-02-06 DIAGNOSIS — M1120 Other chondrocalcinosis, unspecified site: Secondary | ICD-10-CM | POA: Diagnosis not present

## 2024-02-15 ENCOUNTER — Other Ambulatory Visit: Payer: Self-pay | Admitting: Family Medicine

## 2024-02-15 DIAGNOSIS — I10 Essential (primary) hypertension: Secondary | ICD-10-CM

## 2024-02-17 ENCOUNTER — Encounter: Payer: Self-pay | Admitting: Family Medicine

## 2024-02-19 ENCOUNTER — Ambulatory Visit

## 2024-02-20 ENCOUNTER — Encounter: Payer: Self-pay | Admitting: Family Medicine

## 2024-02-20 MED ORDER — QVAR REDIHALER 40 MCG/ACT IN AERB: 2.0000 | INHALATION_SPRAY | Freq: Two times a day (BID) | RESPIRATORY_TRACT | 5 refills | Status: DC

## 2024-02-21 DIAGNOSIS — D1801 Hemangioma of skin and subcutaneous tissue: Secondary | ICD-10-CM | POA: Diagnosis not present

## 2024-02-21 DIAGNOSIS — H0264 Xanthelasma of left upper eyelid: Secondary | ICD-10-CM | POA: Diagnosis not present

## 2024-02-21 DIAGNOSIS — L304 Erythema intertrigo: Secondary | ICD-10-CM | POA: Diagnosis not present

## 2024-02-21 DIAGNOSIS — D225 Melanocytic nevi of trunk: Secondary | ICD-10-CM | POA: Diagnosis not present

## 2024-02-21 DIAGNOSIS — L820 Inflamed seborrheic keratosis: Secondary | ICD-10-CM | POA: Diagnosis not present

## 2024-02-21 DIAGNOSIS — I872 Venous insufficiency (chronic) (peripheral): Secondary | ICD-10-CM | POA: Diagnosis not present

## 2024-02-21 DIAGNOSIS — I8312 Varicose veins of left lower extremity with inflammation: Secondary | ICD-10-CM | POA: Diagnosis not present

## 2024-02-21 DIAGNOSIS — L821 Other seborrheic keratosis: Secondary | ICD-10-CM | POA: Diagnosis not present

## 2024-02-21 DIAGNOSIS — I8311 Varicose veins of right lower extremity with inflammation: Secondary | ICD-10-CM | POA: Diagnosis not present

## 2024-02-28 ENCOUNTER — Ambulatory Visit: Admitting: *Deleted

## 2024-02-28 VITALS — Ht 61.0 in | Wt 142.0 lb

## 2024-02-28 DIAGNOSIS — R21 Rash and other nonspecific skin eruption: Secondary | ICD-10-CM

## 2024-02-28 DIAGNOSIS — Z0000 Encounter for general adult medical examination without abnormal findings: Secondary | ICD-10-CM

## 2024-02-28 MED ORDER — NYSTATIN 100000 UNIT/GM EX CREA: TOPICAL_CREAM | Freq: Two times a day (BID) | CUTANEOUS | Status: DC | PRN

## 2024-02-28 MED ORDER — QVAR REDIHALER 40 MCG/ACT IN AERB: 1.0000 | INHALATION_SPRAY | Freq: Two times a day (BID) | RESPIRATORY_TRACT | Status: DC

## 2024-02-28 NOTE — Patient Instructions (Addendum)
 Kayla [name] , Thank you for taking time out of your busy schedule to complete your Annual Wellness Visit with me. I enjoyed our conversation and look forward to speaking with you again next year. I, as well as your care team,  appreciate your ongoing commitment to your health goals. Please review the following plan we discussed and let me know if I can assist you in the future. Your Game plan/ To Do List     Follow up Visits: Next Medicare AWV with our clinical staff: 03/05/25 1pm  Next Office Visit with your provider: 07/06/24 10:20am  Clinician Recommendations:  Aim for 30 minutes of exercise or brisk walking, 6-8 glasses of water, and 5 servings of fruits and vegetables each day.       This is a list of the screening recommended for you and due dates:  Health Maintenance  Topic Date Due   COVID-19 Vaccine (4 - 2024-25 season) 05/12/2023   Medicare Annual Wellness Visit  02/14/2024   Flu Shot  04/10/2024   Mammogram  08/01/2024   DTaP/Tdap/Td vaccine (3 - Td or Tdap) 02/16/2025   Pneumococcal Vaccine for age over 75  Completed   DEXA scan (bone density measurement)  Completed   Hepatitis C Screening  Completed   Zoster (Shingles) Vaccine  Completed   HPV Vaccine  Aged Out   Meningitis B Vaccine  Aged Out   Colon Cancer Screening  Discontinued    Please review / update living will, healthcare power of attorney (I am mailing to you) and return by either of the following below. . Advanced directives: (Copy Requested) Please bring a copy of your health care power of attorney and living will to the office to be added to your chart at your convenience. You can mail to [facility] [address] [place], [place] [address] or email to [email or web address] Advance Care Planning is important because it:  [x]  Makes sure you receive the medical care that is consistent with your values, goals, and preferences  [x]  It provides guidance to your family and loved ones and  reduces their decisional burden about whether or not they are making the right decisions based on your wishes.  Follow the link provided in your after visit summary or read over the paperwork we have mailed to you to help you started getting your Advance Directives in place. If you need assistance in completing these, please reach out to us  so that we can help you!  See attachments for Preventive Care Tips.

## 2024-02-28 NOTE — Progress Notes (Signed)
 Subjective:   Kayla [name] is a 78 y.o. who presents for a Medicare Wellness preventive visit.  As a reminder, Annual Wellness Visits don't include a physical exam, and some assessments may be limited, especially if this visit is performed virtually. We may recommend an in-person follow-up visit with your provider if needed.  Visit Complete: Virtual I connected with  Kayla [name] on 02/28/24 by a audio enabled telemedicine application and verified that I am speaking with the correct person using two identifiers.  Patient Location: Home  Provider Location: Office/Clinic  I discussed the limitations of evaluation and management by telemedicine. The patient expressed understanding and agreed to proceed.  Vital Signs: Because this visit was a virtual/telehealth visit, some criteria may be missing or patient reported. Any vitals not documented were not able to be obtained and vitals that have been documented are patient reported.  VideoDeclined- This patient declined Librarian, academic. Therefore the visit was completed with audio only.  Persons Participating in Visit: Patient.  AWV Questionnaire: Yes: Patient Medicare AWV questionnaire was completed by the patient on 02/27/24; I have confirmed that all information answered by patient is correct and no changes since this date.  Cardiac Risk Factors include: advanced age (>33men, >11 women);dyslipidemia;hypertension     Objective:    Today's Vitals   02/28/24 1418  Weight: 142 lb (64.4 kg)  Height: 5' 1 (1.549 m)   Body mass index is 26.83 kg/m.     02/28/2024    2:35 PM 02/14/2023    1:28 PM 06/09/2022    7:00 PM 02/06/2022    4:04 PM 01/13/2022   11:46 AM 02/02/2021   12:47 PM 08/02/2020    6:22 PM  Advanced Directives  Does Patient Have a Medical Advance Directive? No;Yes Yes No Yes Yes Yes No  Type of Advance Directive Living will Healthcare Power of [place];Living will  Healthcare Power of  [place];Out of facility DNR (pink MOST or yellow form);Living will  Healthcare Power of [place];Living will   Does patient want to make changes to medical advance directive? No - Patient declined   No - Patient declined     Copy of Healthcare Power of Attorney in Chart?  No - copy requested  No - copy requested  No - copy requested   Would patient like information on creating a medical advance directive? No - Patient declined          Current Medications (verified) Outpatient Encounter Medications as of 02/28/2024  Medication Sig   acetaminophen (TYLENOL) 500 MG tablet Take 500 mg by mouth as needed.   albuterol  (PROVENTIL ) (2.5 MG/3ML) 0.083% nebulizer solution Take 3 mLs (2.5 mg total) by nebulization every 6 (six) hours as needed for wheezing or shortness of breath.   albuterol  (VENTOLIN  HFA) 108 (90 Base) MCG/ACT inhaler Inhale 2 puffs into the lungs every 6 (six) hours as needed for wheezing or shortness of breath.   amLODipine  (NORVASC ) 2.5 MG tablet Take 1 tablet (2.5 mg total) by mouth daily.   bifidobacterium infantis (ALIGN) capsule Take 1 capsule by mouth daily.   cholecalciferol (VITAMIN D ) 1000 units tablet Take 3,000 Units by mouth daily.   EPINEPHrine  (EPIPEN  2-PAK) 0.3 mg/0.3 mL IJ SOAJ injection As directed   Esomeprazole  Magnesium  (NEXIUM  24HR PO) Take 1 capsule by mouth as needed.   famotidine (PEPCID) 20 MG tablet Take 20 mg by mouth daily.   Flaxseed, Linseed, (GROUND FLAX SEEDS PO)    hyoscyamine  (LEVSIN  SL)  0.125 MG SL tablet Place 1 tablet (0.125 mg total) under the tongue every 6 (six) hours as needed.   latanoprost (XALATAN) 0.005 % ophthalmic solution Place 1 drop into both eyes at bedtime.   metoprolol  tartrate (LOPRESSOR ) 25 MG tablet 1 po bid   montelukast  (SINGULAIR ) 10 MG tablet Take 1 tablet (10 mg total) by mouth at bedtime.   nystatin  cream (MYCOSTATIN ) Apply topically 2 (two) times daily as needed for dry skin.   Polyethyl Glycol-Propyl Glycol (SYSTANE)  0.4-0.3 % GEL ophthalmic gel Place 1 application into both eyes.   QVAR  REDIHALER 40 MCG/ACT inhaler Inhale 1 puff into the lungs 2 (two) times daily.   sodium chloride  (OCEAN) 0.65 % SOLN nasal spray Place 1 spray into both nostrils as needed for congestion.   triamcinolone  (KENALOG ) 0.1 % Apply 1 application topically 2 (two) times daily.   Triamcinolone  Acetonide (NASACORT  AQ NA) Place 2 sprays into both nostrils daily.   Vitamin Mixture (ESTER-C PO) Take 1,000 mg by mouth daily.   [DISCONTINUED] QVAR  REDIHALER 40 MCG/ACT inhaler Inhale 2 puffs into the lungs 2 (two) times daily.   [DISCONTINUED] azelastine  (ASTELIN ) 0.1 % nasal spray Place 1 spray into both nostrils 2 (two) times daily. Use in each nostril as directed   [DISCONTINUED] nystatin  cream (MYCOSTATIN ) Apply topically 2 (two) times daily.   [DISCONTINUED] promethazine -dextromethorphan (PROMETHAZINE -DM) 6.25-15 MG/5ML syrup Take 5 mLs by mouth 4 (four) times daily as needed.   No facility-administered encounter medications on file as of 02/28/2024.    Allergies (verified) Cephalosporins, Gabapentin, Levofloxacin, Psyllium, Sulfonamide derivatives, Aspirin, Belladonna, Ciprofloxacin hcl, Conjugated estrogens, Cymbopogon, Doxycycline , Flovent  hfa [fluticasone ], Nabumetone, Nsaids, Soybean-containing drug products, Zicam cold remedy [homeopathic products], Zocor  [simvastatin ], Nickel, Paba derivatives, and Prednisone    History: Past Medical History:  Diagnosis Date   Abscess of left groin 11/10/2014   Allergy     Arthritis    Asthma    Cataract 2015   Very mild   Cellulitis 10/20/2014   Diverticulitis    Diverticulosis    Fasting hyperglycemia 04/2011   FBS 108   Fibromyalgia    Fundic gland polyps of stomach, benign    GERD (gastroesophageal reflux disease)    gastric polyp x3   Glaucoma     Dr Danley Dusky   Hypertension    IBS (irritable bowel syndrome)    Osteopenia    last 07/2011   Pneumonia     OP as child    Shingles    Past Surgical History:  Procedure Laterality Date   COLONOSCOPY W/ POLYPECTOMY  1993,2002,2007   last  colonoscopy 2007, Dr Willy Harvest   DILATION AND CURETTAGE OF UTERUS     ESOPHAGOGASTRODUODENOSCOPY     SPINE SURGERY     T9-L5 fusions   Family History  Problem Relation Age of Onset   Breast cancer Mother    Heart failure Father        CHF, CABG late 23s   Breast cancer Sister    Breast cancer Maternal Aunt        two   Heart disease Maternal Aunt    Breast cancer Paternal Aunt    Asthma Paternal Aunt    Coronary artery disease Paternal Aunt        triple CABG   Stomach cancer Paternal Uncle    Heart attack Paternal Uncle         MI in 70s   Kidney disease Maternal Grandmother    Heart attack Paternal Grandmother  MI in late 60s   Heart disease Paternal Grandfather    Pulmonary embolism Son    Deep vein thrombosis Son    Heart Problems Son        Heart attacks related to a blood disorder    Ovarian cancer Other        Niece x2   Breast cancer Other        niece   Diabetes Neg Hx    Stroke Neg Hx    COPD Neg Hx    Colon polyps Neg Hx    Colon cancer Neg Hx    Esophageal cancer Neg Hx    Rectal cancer Neg Hx    Social History   Socioeconomic History   Marital status: Widowed    Spouse name: Not on file   Number of children: Not on file   Years of education: Not on file   Highest education level: Some college, no degree  Occupational History   Occupation: retired  Tobacco Use   Smoking status: Former    Current packs/day: 0.00    Average packs/day: 0.5 packs/day for 10.0 years (5.0 ttl pk-yrs)    Types: Cigarettes    Start date: 09/10/1964    Quit date: 09/10/1974    Years since quitting: 49.5   Smokeless tobacco: Never   Tobacco comments:    smoked 1966- 1976, up to 1 ppd  Vaping Use   Vaping status: Never Used  Substance and Sexual Activity   Alcohol use: No    Alcohol/week: 0.0 standard drinks of alcohol   Drug use: No   Sexual  activity: Not Currently  Other Topics Concern   Not on file  Social History Narrative   Daily caffeine 3 cups   Regular exercise   Married         Social Drivers of Health   Financial Resource Strain: Low Risk  (02/28/2024)   Overall Financial Resource Strain (CARDIA)    Difficulty of Paying Living Expenses: Not very hard  Food Insecurity: No Food Insecurity (02/27/2024)   Hunger Vital Sign    Worried About Running Out of Food in the Last Year: Never true    Ran Out of Food in the Last Year: Never true  Transportation Needs: No Transportation Needs (02/27/2024)   PRAPARE - Administrator, Civil Service (Medical): No    Lack of Transportation (Non-Medical): No  Physical Activity: Insufficiently Active (02/27/2024)   Exercise Vital Sign    Days of Exercise per Week: 3 days    Minutes of Exercise per Session: 30 min  Stress: No Stress Concern Present (02/27/2024)   [employer] of Occupational Health - Occupational Stress Questionnaire    Feeling of Stress: Not at all  Social Connections: Moderately Integrated (02/27/2024)   Social Connection and Isolation Panel    Frequency of Communication with Friends and Family: Three times a week    Frequency of Social Gatherings with Friends and Family: Three times a week    Attends Religious Services: More than 4 times per year    Active Member of Clubs or Organizations: Yes    Attends Banker Meetings: More than 4 times per year    Marital Status: Widowed    Tobacco Counseling Counseling given: Not Answered Tobacco comments: smoked 1966- 1976, up to 1 ppd    Clinical Intake:  Pre-visit preparation completed: Yes  Pain : No/denies pain     BMI - recorded: 26.83 Nutritional Status: BMI 25 -  29 Overweight Nutritional Risks: None Diabetes: No  Lab Results  Component Value Date   HGBA1C 5.4 11/01/2011   HGBA1C 5.1 04/02/2007     How often do you need to have someone help you when you read  instructions, pamphlets, or other written materials from your doctor or pharmacy?: 1 - Never What is the last grade level you completed in school?: some college  Interpreter Needed?: No  Information entered by :: Susa Engman, CMA   Activities of Daily Living     02/27/2024   10:49 PM  In your present state of health, do you have any difficulty performing the following activities:  Hearing? 0  Vision? 0  Difficulty concentrating or making decisions? 0  Walking or climbing stairs? 0  Dressing or bathing? 0  Doing errands, shopping? 0  Preparing Food and eating ? N  Using the Toilet? N  In the past six months, have you accidently leaked urine? Y  Comment Rarely, when sneezing, coughing etc.  Do you have problems with loss of bowel control? N  Managing your Medications? N  Managing your Finances? N  Housekeeping or managing your Housekeeping? N    Patient Care Team: Crecencio Dodge, Candida Chalk, DO as PCP - General (Family Medicine) Kenney Peacemaker, MD as Consulting Physician (Gastroenterology) Corie Diamond, MD as Consulting Physician (Ophthalmology) Beshears, Alfonso Angles, DMD (Dentistry) Eilleen Grates, MD as Consulting Physician (Cardiology) Janita Mellow, MD as Consulting Physician (Otolaryngology) Manual Self, Cloyd Dare., DDS as Consulting Physician (Oral Surgery) Stefan Edge, MD as Consulting Physician (Rheumatology) Emilie Harden, MD as Consulting Physician (Endocrinology) Jewell, Jolene R, MD as Consulting Physician (Dermatology)  I have updated your Care Teams any recent Medical Services you may have received from other providers in the past year.     Assessment:   This is a routine wellness examination for Lindi.  Hearing/Vision screen Hearing Screening - Comments:: Denies hearing difficulties.  Vision Screening - Comments:: Last eye exam  1-2 months ago with     Goals Addressed               This Visit's Progress     maintain current healthy  lifestyle (pt-stated)   On track      Depression Screen     02/28/2024    2:39 PM 02/14/2023    1:10 PM 02/06/2022    4:44 PM 02/06/2022    4:43 PM 02/06/2022    4:20 PM 02/02/2021   12:54 PM 01/05/2020   11:19 AM  PHQ 2/9 Scores  PHQ - 2 Score 0 0 0 0 0 0 0    Fall Risk     02/27/2024   10:49 PM 02/14/2023    1:07 PM 02/06/2022    4:05 PM 02/02/2021   12:53 PM 01/05/2020   11:18 AM  Fall Risk   Falls in the past year? 0 0 0 0 0   Number falls in past yr: 0 0 0 0 0  Injury with Fall? 0 0 0 0 0  Risk for fall due to :  No Fall Risks No Fall Risks    Follow up Education provided Falls evaluation completed Falls evaluation completed  Falls prevention discussed  Education provided;Falls prevention discussed      Data saved with a previous flowsheet row definition    MEDICARE RISK AT HOME:  Medicare Risk at Home Any stairs in or around the home?: (Patient-Rptd) Yes If so, are there any without handrails?: (Patient-Rptd) No Home free of  loose throw rugs in walkways, pet beds, electrical cords, etc?: (Patient-Rptd) Yes Adequate lighting in your home to reduce risk of falls?: (Patient-Rptd) Yes Life alert?: (Patient-Rptd) No Use of a cane, walker or w/c?: (Patient-Rptd) No Grab bars in the bathroom?: (Patient-Rptd) Yes Shower chair or bench in shower?: (Patient-Rptd) No Elevated toilet seat or a handicapped toilet?: (Patient-Rptd) No  TIMED UP AND GO:  Was the test performed?  No, audio   Cognitive Function: 6CIT completed    02/14/2023    1:36 PM 07/17/2017   11:31 AM  MMSE - Mini Mental State Exam  Not completed: Unable to complete   Orientation to time  5   Orientation to Place  5   Registration  3   Attention/ Calculation  5   Recall  3   Language- name 2 objects  2   Language- repeat  1  Language- follow 3 step command  3   Language- read & follow direction  1   Write a sentence  1   Copy design  1   Total score  30      Data saved with a previous flowsheet row  definition        02/28/2024    2:44 PM 02/06/2022    4:35 PM  6CIT Screen  What Year? 0 points 0 points  What month? 0 points 0 points  What time? 0 points 0 points  Count back from 20 0 points 0 points  Months in reverse 0 points 0 points  Repeat phrase 0 points 0 points  Total Score 0 points 0 points    Immunizations Immunization History  Administered Date(s) Administered   Fluad Quad(high Dose 65+) 06/12/2022   Fluad Trivalent(High Dose 65+) 05/14/2023   Influenza Whole 06/14/2008, 06/09/2010, 07/06/2011   Influenza, High Dose Seasonal PF 06/10/2014, 05/26/2015, 05/24/2016, 07/02/2017, 05/26/2019, 06/08/2021   Influenza, Seasonal, Injecte, Preservative Fre 05/26/2013   Influenza-Unspecified 05/30/2012, 05/22/2013, 05/15/2018, 05/26/2019, 06/07/2020   PFIZER Comirnaty(Gray Top)Covid-19 Tri-Sucrose Vaccine 10/11/2020   PFIZER(Purple Top)SARS-COV-2 Vaccination 11/05/2019, 11/25/2019   Pneumococcal Conjugate-13 01/11/2014   Pneumococcal Polysaccharide-23 07/12/2011, 04/24/2019   Td 03/07/2006   Tdap 02/17/2015   Zoster Recombinant(Shingrix) 12/08/2017, 03/21/2018   Zoster, Live 08/19/2013    Screening Tests Health Maintenance  Topic Date Due   Medicare Annual Wellness (AWV)  02/14/2024   COVID-19 Vaccine (4 - 2024-25 season) 02/27/2025 (Originally 05/12/2023)   INFLUENZA VACCINE  04/10/2024   MAMMOGRAM  08/01/2024   DTaP/Tdap/Td (3 - Td or Tdap) 02/16/2025   Pneumococcal Vaccine: 50+ Years  Completed   DEXA SCAN  Completed   Hepatitis C Screening  Completed   Zoster Vaccines- Shingrix  Completed   HPV VACCINES  Aged Out   Meningococcal B Vaccine  Aged Out   Colonoscopy  Discontinued    Health Maintenance  Health Maintenance Due  Topic Date Due   Medicare Annual Wellness (AWV)  02/14/2024   Health Maintenance Items Addressed: HM up to date.  Additional Screening:  Vision Screening: Recommended annual ophthalmology exams for early detection of glaucoma and  other disorders of the eye. Would you like a referral to an eye doctor? No    Dental Screening: Recommended annual dental exams for proper oral hygiene  Community Resource Referral / Chronic Care Management: CRR required this visit?  No   CCM required this visit?  No   Plan:    I have personally reviewed and noted the following in the patient's chart:   Medical and social history  Use of alcohol, tobacco or illicit drugs  Current medications and supplements including opioid prescriptions. Patient is not currently taking opioid prescriptions. Functional ability and status Nutritional status Physical activity Advanced directives List of other physicians Hospitalizations, surgeries, and ER visits in previous 12 months Vitals Screenings to include cognitive, depression, and falls Referrals and appointments  In addition, I have reviewed and discussed with patient certain preventive protocols, quality metrics, and best practice recommendations. A written personalized care plan for preventive services as well as general preventive health recommendations were provided to patient.  Susa Engman, CMA   02/28/2024   After Visit Summary: (MyChart) Due to this being a telephonic visit, the after visit summary with patients personalized plan was offered to patient via MyChart   Notes: Nothing significant to report at this time.

## 2024-03-06 ENCOUNTER — Telehealth: Payer: Self-pay | Admitting: Cardiology

## 2024-03-06 NOTE — Telephone Encounter (Signed)
 Received message from After Hours about patient having lightheadedness and HR 56. I spoke to patient and she states that she spoke to the on call provider last night but I dont see any documentation. I contacted the patient just to make she had talked to someone. She says that she is doing better, feels like her potassium was low and wants to know how she can tell if she is drinking too much water. She has not been seen by Dr. Lavona since 2023 and told to follow up as needed, does she need to come in and be seen? Please advise.

## 2024-03-06 NOTE — Telephone Encounter (Signed)
 Called and spoke to pt about her episode. She states she had a mild headache, felt a little weak, and was mildly lightheaded, on 03/05/24; she did not feel horrible and did not feel like she was going to pass out but had to hold on to the wall while walking. Kardia Mobile at first was inconclusive' and then later showed Normal Sinus Rhythm. She called the on call provider (Dr. Gladis) who advised her that she did not need to present to ER. She recently had a bad virus, several weeks ago and this has flared up her Asthma. She is on a Steroid INhaler QVAR , 1 puff BID. BP runs around 116-130/60-80. HR usually in the 50's.   For now, scheduled her to see Dr. Lavona on 03/26/24; last saw him in 2023. Advised pt to continue to stay hydrated and to call back if needed.

## 2024-03-23 ENCOUNTER — Other Ambulatory Visit: Payer: Self-pay | Admitting: Family Medicine

## 2024-03-23 DIAGNOSIS — T7840XD Allergy, unspecified, subsequent encounter: Secondary | ICD-10-CM

## 2024-03-24 DIAGNOSIS — R072 Precordial pain: Secondary | ICD-10-CM | POA: Insufficient documentation

## 2024-03-24 NOTE — Progress Notes (Unsigned)
 Cardiology Office Note:   Date:  03/26/2024  ID:  Kayla [name], DOB 08/05/1946, MRN [id number] PCP: Antonio Meth, Jamee SAUNDERS, DO  [facility] Providers Cardiologist:  None {  History of Present Illness:   Kayla [name] is a 78 y.o. female who is referred by Antonio Meth, Jamee SAUNDERS, DO for evaluation of palpitations. I saw her in 2014 for palpitations.  She was in the emergency room on 08/02/2020 with palpitations and some chest discomfort.She had a 21 day event monitor.  She had some brief runs of SVT.  She had normal coronaries on CT in 2022.     She returns for follow-up.  She called late last month.  She had some lightheadedness.  She just felt weird inside.  Everything felt weak and [place].  She did her cardiac and was concerned because it said inconclusive.  This demonstrated sinus rhythm with some artifact when I reviewed this today on her phone.  She said it was a little bit similar to an episode she had in September and she went to the emergency room.  I reviewed this and there were no objective findings.  At that time in September her EKG was unremarkable.  She does have a slow heart rate in the 50s but she said this is always slow and it has not been bothering her.  She talked to her sister who is a retired Engineer, civil (consulting) who said drink some salt water.  She did that and walk around and she did not have any further symptoms.  She has not had any further presyncope or syncope.  She goes to the gym 3 times a week.  She is not having any chest discomfort, neck or arm discomfort.  She has not had any weight gain or edema.  ROS: As stated in the HPI and negative for all other systems.  Studies Reviewed:    EKG:   EKG Interpretation Date/Time:  Thursday March 26 2024 13:30:41 EDT Ventricular Rate:  50 PR Interval:  190 QRS Duration:  70 QT Interval:  444 QTC Calculation: 404 R Axis:   -10  Text Interpretation: Sinus bradycardia Poor anterior R wave progression When compared with ECG  of 09-Jun-2022 19:37, No significant change since last tracing Confirmed by Lavona Agent ([id number]) on 03/26/2024 1:35:51 PM     Risk Assessment/Calculations:     Physical Exam:   VS:  BP (!) 163/71 (BP Location: Left Arm, Patient Position: Sitting, Cuff Size: Normal)   Pulse (!) 51   Resp 16   Ht 5' 1 (1.549 m)   Wt 145 lb (65.8 kg)   SpO2 100%   BMI 27.40 kg/m    Wt Readings from Last 3 Encounters:  03/26/24 145 lb (65.8 kg)  02/28/24 142 lb (64.4 kg)  12/31/23 144 lb 12.8 oz (65.7 kg)     GEN: Well nourished, well developed in no acute distress NECK: No JVD; No carotid bruits CARDIAC: RRR, no murmurs, rubs, gallops RESPIRATORY:  Clear to auscultation without rales, wheezing or rhonchi  ABDOMEN: Soft, non-tender, non-distended EXTREMITIES:  No edema; No deformity   ASSESSMENT AND PLAN:   PALPITATIONS:   The patient had palpitations as described but she had no significant arrhythmias on her monitor and she is not currently having symptoms.  No change in therapy.  No further imaging.  If she has symptoms in the future we might need to reduce her beta-blocker.  She and I talked about that.  DYSLIPIDEMIA: She has no  calcium .  She does have an elevated LDL.  She would prefer lifestyle changes.  No change in therapy.    HTN: Her blood pressure is controlled.  Continue meds as listed.      Follow up with me in 18 months.   Signed, Lynwood Schilling, MD

## 2024-03-26 ENCOUNTER — Ambulatory Visit: Attending: Cardiology | Admitting: Cardiology

## 2024-03-26 ENCOUNTER — Encounter: Payer: Self-pay | Admitting: Cardiology

## 2024-03-26 VITALS — BP 163/71 | HR 51 | Resp 16 | Ht 61.0 in | Wt 145.0 lb

## 2024-03-26 DIAGNOSIS — R002 Palpitations: Secondary | ICD-10-CM | POA: Diagnosis not present

## 2024-03-26 DIAGNOSIS — R072 Precordial pain: Secondary | ICD-10-CM

## 2024-03-26 DIAGNOSIS — E785 Hyperlipidemia, unspecified: Secondary | ICD-10-CM | POA: Diagnosis not present

## 2024-03-26 NOTE — Patient Instructions (Signed)
 Medication Instructions:  Your physician recommends that you continue on your current medications as directed. Please refer to the Current Medication list given to you today.  *If you need a refill on your cardiac medications before your next appointment, please call your pharmacy*  Lab Work: NONE If you have labs (blood work) drawn today and your tests are completely normal, you will receive your results only by: MyChart Message (if you have MyChart) OR A paper copy in the mail If you have any lab test that is abnormal or we need to change your treatment, we will call you to review the results.  Testing/Procedures: NONE  Follow-Up: At [facility], you and your health needs are our priority.  As part of our continuing mission to provide you with exceptional heart care, our providers are all part of one team.  This team includes your primary Cardiologist (physician) and Advanced Practice Providers or APPs (Physician Assistants and Nurse Practitioners) who all work together to provide you with the care you need, when you need it.  Your next appointment:   18 months (Jan 2027)  Provider:   Lavona, MD  We recommend signing up for the patient portal called MyChart.  Sign up information is provided on this After Visit Summary.  MyChart is used to connect with patients for Virtual Visits (Telemedicine).  Patients are able to view lab/test results, encounter notes, upcoming appointments, etc.  Non-urgent messages can be sent to your provider as well.   To learn more about what you can do with MyChart, go to [email or web address].

## 2024-04-09 ENCOUNTER — Telehealth: Payer: Self-pay | Admitting: Internal Medicine

## 2024-04-09 ENCOUNTER — Other Ambulatory Visit: Payer: Self-pay | Admitting: Internal Medicine

## 2024-04-09 NOTE — Telephone Encounter (Signed)
 Received call from patient stating for the last 2-3 weeks she has been having indigestion, and feels like food is stuck. Also stated she Is having discomfort in lower colon region. States she is unsure if its her stomach or small intestines. Also wants to know if she should continue with hyoscyamine  medication. Please advise, Thank you

## 2024-04-10 NOTE — Telephone Encounter (Signed)
 Patient contacted. She has had low abdominal discomfort x 1 week, not improving. Sunday she had multiple soft bowel movements with some abdominal discomfort. Tuesday to today (Friday) she has had 2 to 3 soft unformed bowel movements each day. Her most noticeable symptom is the low abdominal pressure sensation. She has not taken anything for her symptoms, but does have hyoscyamine  on hand.  She also notes a sensation at times of her food sitting there creating a feeling of being stuck in her esophagus. OTC Nexium  taken for about 2 days and I think it may have helped some. She denies fever, nausea or regurgitation of her food. I have made her an appointment for 04/17/24 which is the soonest available at the time of the phone call. Patient last seen in office 01/26/2022. H/o diverticulitis and GERD. Do you have any recommendations?

## 2024-04-10 NOTE — Telephone Encounter (Signed)
Shared with the patient

## 2024-04-10 NOTE — Telephone Encounter (Signed)
 No answer. No voicemail.

## 2024-04-13 ENCOUNTER — Encounter: Payer: Self-pay | Admitting: Family Medicine

## 2024-04-16 ENCOUNTER — Ambulatory Visit
Admission: EM | Admit: 2024-04-16 | Discharge: 2024-04-16 | Disposition: A | Discharge status: Home / Self Care | Attending: Internal Medicine | Admitting: Internal Medicine

## 2024-04-16 ENCOUNTER — Encounter: Payer: Self-pay | Admitting: Emergency Medicine

## 2024-04-16 DIAGNOSIS — S61215A Laceration without foreign body of left ring finger without damage to nail, initial encounter: Secondary | ICD-10-CM | POA: Diagnosis not present

## 2024-04-16 DIAGNOSIS — I10 Essential (primary) hypertension: Secondary | ICD-10-CM | POA: Diagnosis not present

## 2024-04-16 DIAGNOSIS — Z23 Encounter for immunization: Secondary | ICD-10-CM | POA: Diagnosis not present

## 2024-04-16 MED ORDER — TETANUS-DIPHTH-ACELL PERTUSSIS 5-2.5-18.5 LF-MCG/0.5 IM SUSY
0.5000 mL | PREFILLED_SYRINGE | Freq: Once | INTRAMUSCULAR | Status: AC
  Administered 2024-04-16: 0.5 mL via INTRAMUSCULAR

## 2024-04-16 NOTE — ED Triage Notes (Signed)
 Pt presents with laceration to left pointer finger that happened today while vacuuming and she squish finger into vacuum connector which cut finger open.   She cleaned wound with saline wound cleaner and peroxide.

## 2024-04-16 NOTE — Discharge Instructions (Addendum)
 Please go to med [facility] and have x-ray performed. Do not check in to the ER. Go to imaging department, get images performed, then go home. You will receive a phone call if the x-ray shows any abnormal results requiring further treatment. If the x-ray results do not change our treatment plan, you will not receive a phone call.  Also see these results on MyChart.  Med [facility] [address] [place], [place]  Wound care: Please keep the area surrounding the wound/sutures clean and dry for the next 24 hours. After 24 hours, you may get the wound wet. Gently clean wound with antibacterial soap. Do not scrub wound. Cover the area with a nonstick bandage and change the bandage 2 times a day.   You should have the sutures removed in 10 days by your primary care provider or at urgent care. Return sooner than 10 days if you experience discharge from your laceration, redness around your laceration, warmth around your laceration, or fever.   You may take over the counter medicines as needed for aches and pains once the numbing wears off.   Thanks for letting me fix your cut today!

## 2024-04-16 NOTE — ED Provider Notes (Addendum)
 [facility]    CSN: [id number] Arrival date & time: 04/16/24  1908      History   Chief Complaint Chief Complaint  Patient presents with   Laceration    HPI Kayla [name] is a 78 y.o. female.   Kayla [name] is a 78 y.o. female presenting for chief complaint of Laceration to the distal pad of the left ring finger that happened this afternoon around 4-6 hours ago. Patient was removing/replacing parts to her vacuum when she accidentally cut her finger on a sharp part of the vacuum. Laceration is U shaped to the distal pad of the left ring finger, bleeding controlled with pressure, however wound continues to ooze a little bit without pressure. She does not take blood thinners, denies previous injuries to the finger, denies paresthesias distally to injury. Last tetanus injection was 9 years ago.      Past Medical History:  Diagnosis Date   Abscess of left groin 11/10/2014   Allergy     Arthritis    Asthma    Cataract 2015   Very mild   Cellulitis 10/20/2014   Diverticulitis    Diverticulosis    Fasting hyperglycemia 04/2011   FBS 108   Fibromyalgia    Fundic gland polyps of stomach, benign    GERD (gastroesophageal reflux disease)    gastric polyp x3   Glaucoma     Dr Camillo   Hypertension    IBS (irritable bowel syndrome)    Osteopenia    last 07/2011   Pneumonia     OP as child   Shingles     Patient Active Problem List   Diagnosis Date Noted   Precordial chest pain 03/24/2024   Viral upper respiratory tract infection 12/12/2023   Bilateral low back pain with sciatica 06/12/2022   Weakness 06/12/2022   Pulmonary nodule less than 1 cm in diameter with low risk for malignant neoplasm 03/14/2021   Asthmatic bronchitis 03/09/2020   Allergies 01/18/2020   Preventative health care 03/29/2019   Pneumonia of left lower lobe due to infectious organism 09/01/2018   Vaginal atrophy 03/24/2018   Seasonal allergies 02/08/2018   Erythema nodosum  06/09/2016   Herpes zoster 09/23/2013   Superficial bruising of finger 02/16/2013   Hyperlipidemia 11/01/2011   Palpitations 05/23/2011   Essential hypertension 04/23/2011   Cough 04/09/2011   THYROID  FUNCTION TEST, ABNORMAL 12/20/2009   History of colonic polyps 11/08/2008   Asthma 01/21/2008   GERD 01/21/2008   IRRITABLE BOWEL SYNDROME 01/21/2008   FIBROMYALGIA 01/21/2008   Vitamin D  deficiency 04/02/2007   DEGENERATIVE DISC DISEASE 04/02/2007   GLAUCOMA, LOW TENSION 02/06/2007   Osteoporosis 02/06/2007    Past Surgical History:  Procedure Laterality Date   COLONOSCOPY W/ POLYPECTOMY  8006,[id number]   last  colonoscopy 2007, Dr Avram   DILATION AND CURETTAGE OF UTERUS     ESOPHAGOGASTRODUODENOSCOPY     SPINE SURGERY     T9-L5 fusions    OB History   No obstetric history on file.      Home Medications    Prior to Admission medications   Medication Sig Start Date End Date Taking? Authorizing Provider  acetaminophen (TYLENOL) 500 MG tablet Take 500 mg by mouth as needed.    [provider]  albuterol  (PROVENTIL ) (2.5 MG/3ML) 0.083% nebulizer solution Take 3 mLs (2.5 mg total) by nebulization every 6 (six) hours as needed for wheezing or shortness of breath. 11/28/23   Cyndi Shaver, PA-C  albuterol  (VENTOLIN  HFA) 108 (90 Base) MCG/ACT inhaler Inhale 2 puffs into the lungs every 6 (six) hours as needed for wheezing or shortness of breath. 12/03/22   Antonio Meth, Jamee SAUNDERS, DO  amLODipine  (NORVASC ) 2.5 MG tablet Take 1 tablet (2.5 mg total) by mouth daily. 02/17/24   Lowne Chase, Yvonne R, DO  bifidobacterium infantis (ALIGN) capsule Take 1 capsule by mouth daily. 12/29/12   Avram Lupita BRAVO, MD  cholecalciferol (VITAMIN D ) 1000 units tablet Take 3,000 Units by mouth daily. 12/24/16   [provider]  colchicine  0.6 MG tablet Take 0.6 mg by mouth daily.    [provider]  EPINEPHrine  (EPIPEN  2-PAK) 0.3 mg/0.3 mL IJ SOAJ injection As directed 12/31/23    Antonio Meth, Yvonne R, DO  Esomeprazole  Magnesium  (NEXIUM  24HR PO) Take 1 capsule by mouth as needed.    [provider]  famotidine  (PEPCID ) 20 MG tablet Take 1 tablet (20 mg total) by mouth 2 (two) times daily. 04/17/24   Beather Delon Gibson, PA  Flaxseed, Linseed, (GROUND FLAX SEEDS PO)  05/26/19   [provider]  hyoscyamine  (LEVSIN  SL) 0.125 MG SL tablet DISSOLVE 1 TABLET(0.125 MG) UNDER THE TONGUE EVERY 6 HOURS AS NEEDED 04/09/24   Avram Lupita BRAVO, MD  latanoprost (XALATAN) 0.005 % ophthalmic solution Place 1 drop into both eyes at bedtime.    [provider]  loratadine (CLARITIN) 10 MG tablet Take 10 mg by mouth daily.    [provider]  metoprolol  tartrate (LOPRESSOR ) 25 MG tablet 1 po bid 06/17/23   Antonio Meth, Yvonne R, DO  montelukast  (SINGULAIR ) 10 MG tablet Take 1 tablet (10 mg total) by mouth at bedtime. 03/23/24   Lowne Chase, Yvonne R, DO  nystatin  cream (MYCOSTATIN ) Apply topically 2 (two) times daily as needed for dry skin. 02/28/24   Lowne Chase, Yvonne R, DO  Polyethyl Glycol-Propyl Glycol (SYSTANE) 0.4-0.3 % GEL ophthalmic gel Place 1 application into both eyes.    [provider]  QVAR  REDIHALER 40 MCG/ACT inhaler Inhale 1 puff into the lungs 2 (two) times daily. 02/28/24   Lowne Chase, Yvonne R, DO  sodium chloride  (OCEAN) 0.65 % SOLN nasal spray Place 1 spray into both nostrils as needed for congestion.    [provider]  triamcinolone  (KENALOG ) 0.1 % Apply 1 application topically 2 (two) times daily. 08/01/20   Lowne Chase, Yvonne R, DO  Triamcinolone  Acetonide (NASACORT  AQ NA) Place 2 sprays into both nostrils daily.    [provider]  Vitamin Mixture (ESTER-C PO) Take 1,000 mg by mouth daily.    [provider]    Family History Family History  Problem Relation Age of Onset   Breast cancer Mother    Heart failure Father        CHF, CABG late 38s   Breast cancer Sister    Breast cancer  Maternal Aunt        two   Heart disease Maternal Aunt    Breast cancer Paternal Aunt    Asthma Paternal Aunt    Coronary artery disease Paternal Aunt        triple CABG   Stomach cancer Paternal Uncle    Heart attack Paternal Uncle         MI in 67s   Kidney disease Maternal Grandmother    Heart attack Paternal Grandmother        MI in late 53s   Heart disease Paternal Grandfather    Pulmonary embolism  Son    Deep vein thrombosis Son    Heart Problems Son        Heart attacks related to a blood disorder    Ovarian cancer Other        Niece x2   Breast cancer Other        niece   Diabetes Neg Hx    Stroke Neg Hx    COPD Neg Hx    Colon polyps Neg Hx    Colon cancer Neg Hx    Esophageal cancer Neg Hx    Rectal cancer Neg Hx     Social History Social History   Tobacco Use   Smoking status: Former    Current packs/day: 0.00    Average packs/day: 0.5 packs/day for 10.0 years (5.0 ttl pk-yrs)    Types: Cigarettes    Start date: 09/10/1964    Quit date: 09/10/1974    Years since quitting: 49.6   Smokeless tobacco: Never   Tobacco comments:    smoked 1966- 1976, up to 1 ppd  Vaping Use   Vaping status: Never Used  Substance Use Topics   Alcohol use: No    Alcohol/week: 0.0 standard drinks of alcohol   Drug use: No     Allergies   Cephalosporins, Gabapentin, Levofloxacin, Psyllium, Sulfonamide derivatives, Aspirin, Belladonna, Ciprofloxacin hcl, Conjugated estrogens, Cymbopogon, Doxycycline , Flovent  hfa [fluticasone ], Nabumetone, Nsaids, Soybean-containing drug products, Zicam cold remedy [homeopathic products], Zocor  [simvastatin ], Nickel, Paba derivatives, and Prednisone    Review of Systems Review of Systems Per HPI  Physical Exam Triage Vital Signs ED Triage Vitals  Encounter Vitals Group     BP 04/16/24 1913 (!) 197/76     Girls Systolic BP Percentile --      Girls Diastolic BP Percentile --      Boys Systolic BP Percentile --      Boys Diastolic BP  Percentile --      Pulse Rate 04/16/24 1913 93     Resp 04/16/24 1913 17     Temp 04/16/24 1918 98.3 F (36.8 C)     Temp Source 04/16/24 1913 Oral     SpO2 --      Weight --      Height --      Head Circumference --      Peak Flow --      Pain Score --      Pain Loc --      Pain Education --      Exclude from Growth Chart --    No data found.  Updated Vital Signs BP (!) 183/80 (BP Location: Left Arm)   Pulse 93   Temp 98.3 F (36.8 C) (Oral)   Resp 17   Visual Acuity Right Eye Distance:   Left Eye Distance:   Bilateral Distance:    Right Eye Near:   Left Eye Near:    Bilateral Near:     Physical Exam Vitals and nursing note reviewed.  Constitutional:      Appearance: She is not ill-appearing or toxic-appearing.  HENT:     Head: Normocephalic and atraumatic.     Right Ear: Hearing and external ear normal.     Left Ear: Hearing and external ear normal.     Nose: Nose normal.     Mouth/Throat:     Lips: Pink.  Eyes:     General: Lids are normal. Vision grossly intact. Gaze aligned appropriately.     Extraocular Movements: Extraocular movements intact.  Conjunctiva/sclera: Conjunctivae normal.  Pulmonary:     Effort: Pulmonary effort is normal.  Musculoskeletal:     Right hand: Normal.     Left hand: Laceration and tenderness present. No swelling, deformity or bony tenderness. Normal range of motion. Normal strength. Normal sensation. There is no disruption of two-point discrimination. Normal capillary refill. Normal pulse.     Cervical back: Neck supple.     Comments: U shaped laceration to the distal pad of the left ring finger as seen in image below with small puncture wound proximally to aforementioned laceration. Less than 2 cap refill, strength and sensation intact distally, normal ROM of affected digit. No damage to the nail, no visible foreign bodies.   Skin:    General: Skin is warm and dry.     Capillary Refill: Capillary refill takes less than 2  seconds.     Findings: No rash.  Neurological:     General: No focal deficit present.     Mental Status: She is alert and oriented to person, place, and time. Mental status is at baseline.     Cranial Nerves: No dysarthria or facial asymmetry.  Psychiatric:        Mood and Affect: Mood normal.        Speech: Speech normal.        Behavior: Behavior normal.        Thought Content: Thought content normal.        Judgment: Judgment normal.    Laceration prior to repair   Laceration after repair      UC Treatments / Results  Labs (all labs ordered are listed, but only abnormal results are displayed) Labs Reviewed - No data to display  EKG   Radiology No results found.  Procedures Laceration Repair  Date/Time: 04/16/2024 7:47 PM  Performed by: Enedelia Dorna HERO, FNP Authorized by: Enedelia Dorna HERO, FNP   Consent:    Consent obtained:  Verbal   Consent given by:  Patient   Risks, benefits, and alternatives were discussed: yes     Risks discussed:  Infection, need for additional repair, nerve damage, pain, poor wound healing, poor cosmetic result, retained foreign body, tendon damage and vascular damage   Alternatives discussed:  No treatment Universal protocol:    Patient identity confirmed:  Verbally with patient Anesthesia:    Anesthesia method:  Local infiltration   Local anesthetic:  Lidocaine 1% w/o epi Laceration details:    Location:  Finger   Finger location:  L ring finger   Length (cm):  1.5   Depth (mm):  5 Exploration:    Wound exploration: wound explored through full range of motion     Wound extent: no foreign bodies/material noted   Treatment:    Area cleansed with:  Povidone-iodine, Shur-Clens and soap and water (Soaked in soap and water solution prior to repair, cleansed thoroughly with povidone-iodine)   Amount of cleaning:  Standard Skin repair:    Repair method:  Sutures   Suture size:  5-0   Suture material:  Prolene   Suture  technique:  Simple interrupted   Number of sutures:  5 Approximation:    Approximation:  Close Repair type:    Repair type:  Simple Post-procedure details:    Dressing:  Non-adherent dressing   Procedure completion:  Tolerated well, no immediate complications  (including critical care time)  Medications Ordered in UC Medications  Tdap (BOOSTRIX) injection 0.5 mL (0.5 mLs Intramuscular Given 04/16/24 2015)  Initial Impression / Assessment and Plan / UC Course  I have reviewed the triage vital signs and the nursing notes.  Pertinent labs & imaging results that were available during my care of the patient were reviewed by me and considered in my medical decision making (see chart for details).   1. Laceration of left ring  finger without foreign body without damage to nail Laceration repaired, see procedure note above for details. Discussed wound care and cleaning at home.  Imaging: Unfortunately we do not have imaging capabilities in the clinic today. An outpatient order for x-ray of the left ring finger has been placed to be performed tomorrow, staff will call if results require change in treatment plan. No visualized foreign bodies on exam, low suspicion for acute fracture.  Infection return precautions discussed.  Suture removal in 10 days.  Tdap updated today.  Tylenol as needed for pain at home.  Advised to rest and avoid activities that may increase tension to wound/sutures or expose wound to infection. Excuse note given.   2. Elevated blood pressure reading in office with history of hypertension BP elevated in clinic today. No red flag signs/symptoms indicating need for referral to ED due to elevated BP.  Discussed lifestyle and dietary changes to lower BP further. Advised to continue taking BP medication as prescribed and follow-up with PCP to discuss management of HTN further.  Improved to 173/74 on re-check.   BP Readings from Last 3 Encounters:  04/17/24 (!) 144/64   04/16/24 (!) 183/80  03/26/24 (!) 163/71    Counseled patient on potential for adverse effects with medications prescribed/recommended today, strict ER and return-to-clinic precautions discussed, patient verbalized understanding.    Final Clinical Impressions(s) / UC Diagnoses   Final diagnoses:  Laceration of left ring finger without foreign body without damage to nail, initial encounter  Elevated blood pressure reading in office with diagnosis of hypertension     Discharge Instructions      Please go to med [facility] and have x-ray performed. Do not check in to the ER. Go to imaging department, get images performed, then go home. You will receive a phone call if the x-ray shows any abnormal results requiring further treatment. If the x-ray results do not change our treatment plan, you will not receive a phone call.  Also see these results on MyChart.  Med [facility] [address] [place], [place]  Wound care: Please keep the area surrounding the wound/sutures clean and dry for the next 24 hours. After 24 hours, you may get the wound wet. Gently clean wound with antibacterial soap. Do not scrub wound. Cover the area with a nonstick bandage and change the bandage 2 times a day.   You should have the sutures removed in 10 days by your primary care provider or at urgent care. Return sooner than 10 days if you experience discharge from your laceration, redness around your laceration, warmth around your laceration, or fever.   You may take over the counter medicines as needed for aches and pains once the numbing wears off.   Thanks for letting me fix your cut today!           ED Prescriptions   None    PDMP not reviewed this encounter.   Enedelia Dorna HERO, FNP 04/19/24 1550    Enedelia Dorna HERO, FNP 04/19/24 1552

## 2024-04-17 ENCOUNTER — Ambulatory Visit (HOSPITAL_BASED_OUTPATIENT_CLINIC_OR_DEPARTMENT_OTHER)
Admission: RE | Admit: 2024-04-17 | Discharge: 2024-04-17 | Disposition: A | Discharge status: Home / Self Care | Source: Ambulatory Visit | Attending: Internal Medicine | Admitting: Internal Medicine

## 2024-04-17 ENCOUNTER — Ambulatory Visit: Admitting: Physician Assistant

## 2024-04-17 ENCOUNTER — Encounter: Payer: Self-pay | Admitting: Physician Assistant

## 2024-04-17 VITALS — BP 144/64 | HR 52 | Ht 60.5 in | Wt 145.0 lb

## 2024-04-17 DIAGNOSIS — R09A2 Foreign body sensation, throat: Secondary | ICD-10-CM

## 2024-04-17 DIAGNOSIS — K580 Irritable bowel syndrome with diarrhea: Secondary | ICD-10-CM | POA: Diagnosis not present

## 2024-04-17 DIAGNOSIS — K219 Gastro-esophageal reflux disease without esophagitis: Secondary | ICD-10-CM

## 2024-04-17 DIAGNOSIS — S61215A Laceration without foreign body of left ring finger without damage to nail, initial encounter: Secondary | ICD-10-CM | POA: Diagnosis not present

## 2024-04-17 DIAGNOSIS — S61214A Laceration without foreign body of right ring finger without damage to nail, initial encounter: Secondary | ICD-10-CM | POA: Insufficient documentation

## 2024-04-17 DIAGNOSIS — R1030 Lower abdominal pain, unspecified: Secondary | ICD-10-CM

## 2024-04-17 MED ORDER — FAMOTIDINE 20 MG PO TABS: 20.0000 mg | ORAL_TABLET | Freq: Two times a day (BID) | ORAL | 5 refills | Status: AC

## 2024-04-17 NOTE — Patient Instructions (Signed)
 Increase Fiber to 25-35 grams per day.   Use Hyoscyamine  as needed.  We have sent the following medications to your pharmacy for you to pick up at your convenience: Famotidine  20 mg twice daily.   You have been scheduled for a Barium Esophogram at [facility] (1st floor of the hospital) on Thursday 05/07/24 at 9:30 am. Please arrive 30 minutes prior to your appointment for registration. Make certain not to have anything to eat or drink 3 hours prior to your test. If you need to reschedule for any reason, please contact radiology at [phone] to do so. __________________________________________________________________ A barium swallow is an examination that concentrates on views of the esophagus. This tends to be a double contrast exam (barium and two liquids which, when combined, create a gas to distend the wall of the oesophagus) or single contrast (non-ionic iodine based). The study is usually tailored to your symptoms so a good history is essential. Attention is paid during the study to the form, structure and configuration of the esophagus, looking for functional disorders (such as aspiration, dysphagia, achalasia, motility and reflux) EXAMINATION You may be asked to change into a gown, depending on the type of swallow being performed. A radiologist and radiographer will perform the procedure. The radiologist will advise you of the type of contrast selected for your procedure and direct you during the exam. You will be asked to stand, sit or lie in several different positions and to hold a small amount of fluid in your mouth before being asked to swallow while the imaging is performed .In some instances you may be asked to swallow barium coated marshmallows to assess the motility of a solid food bolus. The exam can be recorded as a digital or video fluoroscopy procedure. POST PROCEDURE It will take 1-2 days for the barium to pass through your system. To facilitate this, it is  important, unless otherwise directed, to increase your fluids for the next 24-48hrs and to resume your normal diet.  This test typically takes about 30 minutes to perform. __________________________________________________________________________________

## 2024-04-17 NOTE — Progress Notes (Signed)
 Chief Complaint: Dysphagia, Abdominal Pain, Gas and Diarrhea  HPI:    Kayla [name] is a 78 year old Caucasian female with a past medical history as listed below including fibromyalgia and osteoarthritis as well as erythema nodosum, known to Dr. Avram, who was referred to me by Antonio Meth, Kayla [name], * for a complaint of dysphagia, abdominal pain gas and diarrhea.      11/09/2008 EGD with white exudate in the proximal esophagus, abnormal mucosa in the total stomach biopsied, polyps multiple fundic gland polyps and otherwise normal.  Biopsies were benign and unrevealing, no increased eosinophils.  Did show mild chronic gastritis.    01/10/2022 CT abdomen pelvis with contrast showed high-grade sigmoid diverticulosis with proximal to mid sigmoid focal acute diverticulitis, no perforation or abscess.  Patient given Augmentin  and Flagyl .    03/20/2022 colonoscopy for follow-up of diverticulitis with 2 diminutive polyps in the transverse colon, severe diverticulosis in the sigmoid colon and internal hemorrhoids.  Pathology showed inflammatory polyp.  No repeat recommended due to age.    12/31/23 CBC with neutrophils elevated 79.2%, otherwise normal, normal CMP and vitamin D .    02/06/2024 patient saw a rheumatology in regards to follow-up for fibromyalgia and osteoarthritis as well as erythema nodosum.  She was doing well.  Given Colchicine  as needed.    02/17/2024 CBC normal.  CRP less than 1.  CMP normal.    04/09/2024 patient called in and described for the past 2 3 weeks she has been having indigestion and feels like food is stuck and also some discomfort in her lower colon.  She started taking over-the-counter Nexium  and felt like it helped some.  Told to take hyoscyamine  for lower discomfort.    Today, patient presents to clinic and tells me that she is just having a constellation of issues over the past couple of weeks.  First off she describes some lower abdominal pressure/discomfort and a slight change in  bowel habits.  Describes a long history of spastic colon.  Tells me that she has always had the habit of not leaving the house until she has a bowel movement and she has done that since she was little because sometimes it can come on urgently and she does not want to have to be searching for a toilet.  Describes that she has noticed the change in shape to maybe slightly thinner over the past few years, but over the past couple of weeks she notes that she has been having more episodes of just little bits of softer stool.  This would happen occasionally here and there but it has been happening more often lately.  She is still on a daily probiotic but she has laid off of her Flaxseeds because she was not sure if that is adding to her upper GI issues.  She does have Colchicine  on her med list but has not used this.  Does have a slight increase in stress.  Occasionally she will have abdominal cramping pain, but does not use her Hyoscyamine  routinely.    Also describes upper GI symptoms.  Over the past year a lot of eructations and over the past few weeks increased burping and now feels like something is just sitting in my esophagus.  Tells me this happens off-and-on not every day and it often eases off if she gets up and starts walking around.  Sometimes she feels some discomfort in there and will drink some Keefer to coat the area.  She is currently on Pepcid  20 mg  at night.  She does not want to be on a PPI given that she has osteoporosis.  Tells me Dr. Avram weaned her off this years ago.  Does describe that overall sometimes she will have a great day and have no problems at all.    Lost her husband 2 years ago.  Some added stress from this.    Denies fever, chills or weight loss.    Past Medical History:  Diagnosis Date   Abscess of left groin 11/10/2014   Allergy     Arthritis    Asthma    Cataract 2015   Very mild   Cellulitis 10/20/2014   Diverticulitis    Diverticulosis    Fasting  hyperglycemia 04/2011   FBS 108   Fibromyalgia    Fundic gland polyps of stomach, benign    GERD (gastroesophageal reflux disease)    gastric polyp x3   Glaucoma     Dr Camillo   Hypertension    IBS (irritable bowel syndrome)    Osteopenia    last 07/2011   Pneumonia     OP as child   Shingles     Past Surgical History:  Procedure Laterality Date   COLONOSCOPY W/ POLYPECTOMY  1993,2002,2007   last  colonoscopy 2007, Dr Avram   DILATION AND CURETTAGE OF UTERUS     ESOPHAGOGASTRODUODENOSCOPY     SPINE SURGERY     T9-L5 fusions    Current Outpatient Medications  Medication Sig Dispense Refill   acetaminophen (TYLENOL) 500 MG tablet Take 500 mg by mouth as needed.     albuterol  (PROVENTIL ) (2.5 MG/3ML) 0.083% nebulizer solution Take 3 mLs (2.5 mg total) by nebulization every 6 (six) hours as needed for wheezing or shortness of breath. 150 mL 1   albuterol  (VENTOLIN  HFA) 108 (90 Base) MCG/ACT inhaler Inhale 2 puffs into the lungs every 6 (six) hours as needed for wheezing or shortness of breath. 18 g 5   amLODipine  (NORVASC ) 2.5 MG tablet Take 1 tablet (2.5 mg total) by mouth daily. 90 tablet 1   bifidobacterium infantis (ALIGN) capsule Take 1 capsule by mouth daily. 21 capsule 0   cholecalciferol (VITAMIN D ) 1000 units tablet Take 3,000 Units by mouth daily.     colchicine  0.6 MG tablet Take 0.6 mg by mouth daily.     EPINEPHrine  (EPIPEN  2-PAK) 0.3 mg/0.3 mL IJ SOAJ injection As directed 1 each 0   Esomeprazole  Magnesium  (NEXIUM  24HR PO) Take 1 capsule by mouth as needed.     famotidine  (PEPCID ) 20 MG tablet Take 20 mg by mouth daily.     Flaxseed, Linseed, (GROUND FLAX SEEDS PO)      hyoscyamine  (LEVSIN  SL) 0.125 MG SL tablet DISSOLVE 1 TABLET(0.125 MG) UNDER THE TONGUE EVERY 6 HOURS AS NEEDED 10 tablet 0   latanoprost (XALATAN) 0.005 % ophthalmic solution Place 1 drop into both eyes at bedtime.     loratadine (CLARITIN) 10 MG tablet Take 10 mg by mouth daily.     metoprolol   tartrate (LOPRESSOR ) 25 MG tablet 1 po bid 180 tablet 3   montelukast  (SINGULAIR ) 10 MG tablet Take 1 tablet (10 mg total) by mouth at bedtime. 90 tablet 1   nystatin  cream (MYCOSTATIN ) Apply topically 2 (two) times daily as needed for dry skin.     Polyethyl Glycol-Propyl Glycol (SYSTANE) 0.4-0.3 % GEL ophthalmic gel Place 1 application into both eyes.     QVAR  REDIHALER 40 MCG/ACT inhaler Inhale 1 puff into the lungs 2 (two)  times daily.     sodium chloride  (OCEAN) 0.65 % SOLN nasal spray Place 1 spray into both nostrils as needed for congestion.     triamcinolone  (KENALOG ) 0.1 % Apply 1 application topically 2 (two) times daily. 90 g 3   Triamcinolone  Acetonide (NASACORT  AQ NA) Place 2 sprays into both nostrils daily.     Vitamin Mixture (ESTER-C PO) Take 1,000 mg by mouth daily.     No current facility-administered medications for this visit.    Allergies as of 04/17/2024 - Review Complete 04/17/2024  Allergen Reaction Noted   Cephalosporins  05/24/2011   Gabapentin     Levofloxacin     Psyllium  11/08/2008   Sulfonamide derivatives Anaphylaxis    Aspirin     Belladonna  04/02/2007   Ciprofloxacin hcl  05/24/2011   Conjugated estrogens     Cymbopogon  03/20/2022   Doxycycline   02/27/2016   Flovent  hfa [fluticasone ]  09/11/2018   Nabumetone     Nsaids  05/24/2011   Soybean-containing drug products Other (See Comments) 11/10/2014   Zicam cold remedy [homeopathic products]  12/01/2012   Zocor  [simvastatin ] Itching 10/09/2016   Nickel Itching and Rash 12/22/2020   Paba derivatives Itching and Rash 03/20/2022   Prednisone  Rash     Family History  Problem Relation Age of Onset   Breast cancer Mother    Heart failure Father        CHF, CABG late 31s   Breast cancer Sister    Breast cancer Maternal Aunt        two   Heart disease Maternal Aunt    Breast cancer Paternal Aunt    Asthma Paternal Aunt    Coronary artery disease Paternal Aunt        triple CABG   Stomach  cancer Paternal Uncle    Heart attack Paternal Uncle         MI in 6s   Kidney disease Maternal Grandmother    Heart attack Paternal Grandmother        MI in late 64s   Heart disease Paternal Grandfather    Pulmonary embolism Son    Deep vein thrombosis Son    Heart Problems Son        Heart attacks related to a blood disorder    Ovarian cancer Other        Niece x2   Breast cancer Other        niece   Diabetes Neg Hx    Stroke Neg Hx    COPD Neg Hx    Colon polyps Neg Hx    Colon cancer Neg Hx    Esophageal cancer Neg Hx    Rectal cancer Neg Hx     Social History   Socioeconomic History   Marital status: Widowed    Spouse name: Not on file   Number of children: Not on file   Years of education: Not on file   Highest education level: Some college, no degree  Occupational History   Occupation: retired  Tobacco Use   Smoking status: Former    Current packs/day: 0.00    Average packs/day: 0.5 packs/day for 10.0 years (5.0 ttl pk-yrs)    Types: Cigarettes    Start date: 09/10/1964    Quit date: 09/10/1974    Years since quitting: 49.6   Smokeless tobacco: Never   Tobacco comments:    smoked 1966- 1976, up to 1 ppd  Vaping Use   Vaping status: Never Used  Substance and Sexual Activity   Alcohol use: No    Alcohol/week: 0.0 standard drinks of alcohol   Drug use: No   Sexual activity: Not Currently  Other Topics Concern   Not on file  Social History Narrative   Daily caffeine 3 cups   Regular exercise   Married         Social Drivers of Health   Financial Resource Strain: Low Risk  (02/28/2024)   Overall Financial Resource Strain (CARDIA)    Difficulty of Paying Living Expenses: Not very hard  Food Insecurity: No Food Insecurity (02/27/2024)   Hunger Vital Sign    Worried About Running Out of Food in the Last Year: Never true    Ran Out of Food in the Last Year: Never true  Transportation Needs: No Transportation Needs (02/27/2024)   PRAPARE -  Administrator, Civil Service (Medical): No    Lack of Transportation (Non-Medical): No  Physical Activity: Insufficiently Active (02/27/2024)   Exercise Vital Sign    Days of Exercise per Week: 3 days    Minutes of Exercise per Session: 30 min  Stress: No Stress Concern Present (02/27/2024)   [employer] of Occupational Health - Occupational Stress Questionnaire    Feeling of Stress: Not at all  Social Connections: Moderately Integrated (02/27/2024)   Social Connection and Isolation Panel    Frequency of Communication with Friends and Family: Three times a week    Frequency of Social Gatherings with Friends and Family: Three times a week    Attends Religious Services: More than 4 times per year    Active Member of Clubs or Organizations: Yes    Attends Banker Meetings: More than 4 times per year    Marital Status: Widowed  Intimate Partner Violence: Not At Risk (02/28/2024)   Humiliation, Afraid, Rape, and Kick questionnaire    Fear of Current or Ex-Partner: No    Emotionally Abused: No    Physically Abused: No    Sexually Abused: No    Review of Systems:    Constitutional: No weight loss, fever or chills Cardiovascular: No chest pain  Respiratory: No SOB Gastrointestinal: See HPI and otherwise negative   Physical Exam:  Vital signs: BP (!) 144/64 (BP Location: Left Arm, Patient Position: Sitting, Cuff Size: Normal)   Pulse (!) 52   Ht 5' 0.5 (1.537 m) Comment: height measured without shoes  Wt 145 lb (65.8 kg)   BMI 27.85 kg/m   Constitutional:   Pleasant elderly Caucasian female appears to be in NAD, Well developed, Well nourished, alert and cooperative  Respiratory: Respirations even and unlabored. Lungs clear to auscultation bilaterally.   No wheezes, crackles, or rhonchi.  Cardiovascular: Normal S1, S2. No MRG. Regular rate and rhythm. No peripheral edema, cyanosis or pallor.  Gastrointestinal:  Soft, nondistended, nontender. No rebound or  guarding. Normal bowel sounds. No appreciable masses or hepatomegaly. Rectal:  Not performed.  Psychiatric: Oriented to person, place and time. Demonstrates good judgement and reason without abnormal affect or behaviors.  Recent labs below and see HPI:  CBC    Component Value Date/Time   WBC 6.7 12/31/2023 1129   RBC 4.40 12/31/2023 1129   HGB 13.7 12/31/2023 1129   HCT 41.2 12/31/2023 1129   PLT 175.0 12/31/2023 1129   MCV 93.6 12/31/2023 1129   MCH 30.6 06/09/2022 1935   MCHC 33.2 12/31/2023 1129   RDW 15.0 12/31/2023 1129   LYMPHSABS 0.7 12/31/2023 1129  MONOABS 0.6 12/31/2023 1129   EOSABS 0.1 12/31/2023 1129   BASOSABS 0.0 12/31/2023 1129    CMP     Component Value Date/Time   NA 136 12/31/2023 1129   NA 137 10/03/2020 1517   K 4.2 12/31/2023 1129   CL 100 12/31/2023 1129   CO2 29 12/31/2023 1129   GLUCOSE 90 12/31/2023 1129   BUN 16 12/31/2023 1129   BUN 15 10/03/2020 1517   CREATININE 0.65 12/31/2023 1129   CREATININE 0.74 06/20/2020 1017   CALCIUM  9.3 12/31/2023 1129   PROT 6.9 12/31/2023 1129   ALBUMIN 4.5 12/31/2023 1129   AST 20 12/31/2023 1129   ALT 16 12/31/2023 1129   ALKPHOS 97 12/31/2023 1129   BILITOT 0.7 12/31/2023 1129   GFRNONAA >60 06/09/2022 1935   GFRNONAA 77 01/04/2017 1422   GFRAA 99 10/03/2020 1517   GFRAA 88 01/04/2017 1422    Assessment: 1.  IBS-D: For the patient's whole life has been battling with some changes in bowel habits off-and-on with abdominal discomfort, over the past couple of weeks this seems slightly worse to her, but still has days that are fine, some increased stress in her life likely contributing, last colonoscopy 2023 with an inflammatory polyp and otherwise normal; likely known IBS 2.  GERD: With below, typically controlled on Famotidine  20 mg nightly, previously weaned off of PPIs; likely mild chronic gastritis 3.  Globus sensation: For the past year off-and-on, over the past week some worse, tells me she gets up  and walks around this feeling sometimes passes, last EGD in 2010 with exudate in the esophagus negative for Candida and fundic gland polyps; consider stricture versus dysmotility versus esophagitis versus other  Plan: 1.  Discussed patient's symptoms with her in detail.  Would recommend that she not be afraid of Hyoscyamine  which she has on hand at home 0.125 mg sublingual tabs, she can use this as needed, but should be not afraid to use it once a day if needed.  She just had a refill of this. 2.  Recommend the patient try to achieve 25-35 g of fiber a day through her diet and/or supplement.  We discussed this in detail.  She had been laying off of fiber recently but tells me when she does eat enough fiber she noticed that her bowel movements are better. 3.  Patient is scared of laxatives.  We can try to fix things with fiber first. 4.  Increase Pepcid  to 20 mg twice daily, every morning and nightly #60 with 3 refills.  Discussed that we can increase up to 40 mg twice a day if needed.  She would prefer not to use PPIs given osteoporosis 5.  Ordered a barium esophagram tablet for further evaluation of globus sensation.  Could need an EGD in the future pending results. 6.  Patient to follow in clinic with me in 2 to 3 months or sooner if necessary.  Delon Failing, PA-C [facility] 04/17/2024, 1:38 PM  Cc: Antonio Meth, Kayla [name], *

## 2024-04-18 ENCOUNTER — Ambulatory Visit: Payer: Self-pay | Admitting: Internal Medicine

## 2024-04-27 ENCOUNTER — Encounter: Payer: Self-pay | Admitting: Family Medicine

## 2024-04-27 ENCOUNTER — Ambulatory Visit: Admitting: Family Medicine

## 2024-04-27 VITALS — BP 138/88 | HR 57 | Temp 98.2°F | Resp 18 | Ht 60.5 in | Wt 147.4 lb

## 2024-04-27 DIAGNOSIS — Z4802 Encounter for removal of sutures: Secondary | ICD-10-CM | POA: Diagnosis not present

## 2024-04-27 DIAGNOSIS — S61215A Laceration without foreign body of left ring finger without damage to nail, initial encounter: Secondary | ICD-10-CM | POA: Insufficient documentation

## 2024-04-27 DIAGNOSIS — S61215D Laceration without foreign body of left ring finger without damage to nail, subsequent encounter: Secondary | ICD-10-CM

## 2024-04-27 NOTE — Progress Notes (Signed)
 Subjective:    Patient ID: Kayla [name], female    DOB: 25-Feb-1946, 78 y.o.   MRN: [id number]  Chief Complaint  Patient presents with   Suture / Staple Removal    HPI Patient is in today for suture removal.  Discussed the use of AI scribe software for clinical note transcription with the patient, who gave verbal consent to proceed.  History of Present Illness Kayla [name] is a 78 year old female who presents for suture removal following a finger laceration.  Eleven days ago, she sustained a laceration on her finger while using a vacuum cleaner. Her finger got caught in a piece of the vacuum, resulting in a cut that required stitches. Initially, she attempted to manage the bleeding with a Band-Aid but was unable to stop it, prompting her to seek care at [facility] where she received stitches.  During her visit to urgent care, an x-ray was performed to ensure there was no foreign body in the wound, which showed no abnormalities. She felt something under the skin with her fingernail, which she attempted to remove with a sterilized needle and tweezers without success. She is concerned about the risk of infection if she continues to dig at it.  The stitches are causing significant discomfort, described as 'hurting pretty bad' with pain radiating up her finger. She also mentioned that her skin is very thin, which may have contributed to the discomfort.  She has been using a patch instead of a Band-Aid, and her tetanus vaccination was updated during her urgent care visit. She is allergic to sulfa, so she avoids Neosporin and uses bacitracin instead.    Past Medical History:  Diagnosis Date   Abscess of left groin 11/10/2014   Allergy     Arthritis    Asthma    Cataract 2015   Very mild   Cellulitis 10/20/2014   Diverticulitis    Diverticulosis    Fasting hyperglycemia 04/2011   FBS 108   Fibromyalgia    Fundic gland polyps of stomach, benign    GERD  (gastroesophageal reflux disease)    gastric polyp x3   Glaucoma     Dr Camillo   Hypertension    IBS (irritable bowel syndrome)    Osteopenia    last 07/2011   Pneumonia     OP as child   Shingles     Past Surgical History:  Procedure Laterality Date   COLONOSCOPY W/ POLYPECTOMY  1993,2002,2007   last  colonoscopy 2007, Dr Avram   DILATION AND CURETTAGE OF UTERUS     ESOPHAGOGASTRODUODENOSCOPY     SPINE SURGERY     T9-L5 fusions    Family History  Problem Relation Age of Onset   Breast cancer Mother    Heart failure Father        CHF, CABG late 11s   Breast cancer Sister    Breast cancer Maternal Aunt        two   Heart disease Maternal Aunt    Breast cancer Paternal Aunt    Asthma Paternal Aunt    Coronary artery disease Paternal Aunt        triple CABG   Stomach cancer Paternal Uncle    Heart attack Paternal Uncle         MI in 18s   Kidney disease Maternal Grandmother    Heart attack Paternal Grandmother        MI in late 29s   Heart disease Paternal  Grandfather    Pulmonary embolism Son    Deep vein thrombosis Son    Heart Problems Son        Heart attacks related to a blood disorder    Ovarian cancer Other        Niece x2   Breast cancer Other        niece   Diabetes Neg Hx    Stroke Neg Hx    COPD Neg Hx    Colon polyps Neg Hx    Colon cancer Neg Hx    Esophageal cancer Neg Hx    Rectal cancer Neg Hx     Social History   Socioeconomic History   Marital status: Widowed    Spouse name: Not on file   Number of children: Not on file   Years of education: Not on file   Highest education level: Some college, no degree  Occupational History   Occupation: retired  Tobacco Use   Smoking status: Former    Current packs/day: 0.00    Average packs/day: 0.5 packs/day for 10.0 years (5.0 ttl pk-yrs)    Types: Cigarettes    Start date: 09/10/1964    Quit date: 09/10/1974    Years since quitting: 49.6   Smokeless tobacco: Never   Tobacco comments:     smoked 1966- 1976, up to 1 ppd  Vaping Use   Vaping status: Never Used  Substance and Sexual Activity   Alcohol use: No    Alcohol/week: 0.0 standard drinks of alcohol   Drug use: No   Sexual activity: Not Currently  Other Topics Concern   Not on file  Social History Narrative   Daily caffeine 3 cups   Regular exercise   Married         Social Drivers of Health   Financial Resource Strain: Low Risk  (02/28/2024)   Overall Financial Resource Strain (CARDIA)    Difficulty of Paying Living Expenses: Not very hard  Food Insecurity: No Food Insecurity (02/27/2024)   Hunger Vital Sign    Worried About Running Out of Food in the Last Year: Never true    Ran Out of Food in the Last Year: Never true  Transportation Needs: No Transportation Needs (02/27/2024)   PRAPARE - Administrator, Civil Service (Medical): No    Lack of Transportation (Non-Medical): No  Physical Activity: Insufficiently Active (02/27/2024)   Exercise Vital Sign    Days of Exercise per Week: 3 days    Minutes of Exercise per Session: 30 min  Stress: No Stress Concern Present (02/27/2024)   [employer] of Occupational Health - Occupational Stress Questionnaire    Feeling of Stress: Not at all  Social Connections: Moderately Integrated (02/27/2024)   Social Connection and Isolation Panel    Frequency of Communication with Friends and Family: Three times a week    Frequency of Social Gatherings with Friends and Family: Three times a week    Attends Religious Services: More than 4 times per year    Active Member of Clubs or Organizations: Yes    Attends Banker Meetings: More than 4 times per year    Marital Status: Widowed  Intimate Partner Violence: Not At Risk (02/28/2024)   Humiliation, Afraid, Rape, and Kick questionnaire    Fear of Current or Ex-Partner: No    Emotionally Abused: No    Physically Abused: No    Sexually Abused: No    Outpatient Medications Prior to Visit  Medication Sig Dispense Refill   acetaminophen (TYLENOL) 500 MG tablet Take 500 mg by mouth as needed.     albuterol  (PROVENTIL ) (2.5 MG/3ML) 0.083% nebulizer solution Take 3 mLs (2.5 mg total) by nebulization every 6 (six) hours as needed for wheezing or shortness of breath. 150 mL 1   albuterol  (VENTOLIN  HFA) 108 (90 Base) MCG/ACT inhaler Inhale 2 puffs into the lungs every 6 (six) hours as needed for wheezing or shortness of breath. 18 g 5   amLODipine  (NORVASC ) 2.5 MG tablet Take 1 tablet (2.5 mg total) by mouth daily. 90 tablet 1   bifidobacterium infantis (ALIGN) capsule Take 1 capsule by mouth daily. 21 capsule 0   cholecalciferol (VITAMIN D ) 1000 units tablet Take 3,000 Units by mouth daily.     colchicine  0.6 MG tablet Take 0.6 mg by mouth daily.     EPINEPHrine  (EPIPEN  2-PAK) 0.3 mg/0.3 mL IJ SOAJ injection As directed 1 each 0   Esomeprazole  Magnesium  (NEXIUM  24HR PO) Take 1 capsule by mouth as needed.     famotidine  (PEPCID ) 20 MG tablet Take 1 tablet (20 mg total) by mouth 2 (two) times daily. 60 tablet 5   Flaxseed, Linseed, (GROUND FLAX SEEDS PO)      hyoscyamine  (LEVSIN  SL) 0.125 MG SL tablet DISSOLVE 1 TABLET(0.125 MG) UNDER THE TONGUE EVERY 6 HOURS AS NEEDED 10 tablet 0   latanoprost (XALATAN) 0.005 % ophthalmic solution Place 1 drop into both eyes at bedtime.     loratadine (CLARITIN) 10 MG tablet Take 10 mg by mouth daily.     metoprolol  tartrate (LOPRESSOR ) 25 MG tablet 1 po bid 180 tablet 3   montelukast  (SINGULAIR ) 10 MG tablet Take 1 tablet (10 mg total) by mouth at bedtime. 90 tablet 1   nystatin  cream (MYCOSTATIN ) Apply topically 2 (two) times daily as needed for dry skin.     Polyethyl Glycol-Propyl Glycol (SYSTANE) 0.4-0.3 % GEL ophthalmic gel Place 1 application into both eyes.     QVAR  REDIHALER 40 MCG/ACT inhaler Inhale 1 puff into the lungs 2 (two) times daily.     sodium chloride  (OCEAN) 0.65 % SOLN nasal spray Place 1 spray into both nostrils as needed for  congestion.     triamcinolone  (KENALOG ) 0.1 % Apply 1 application topically 2 (two) times daily. 90 g 3   Triamcinolone  Acetonide (NASACORT  AQ NA) Place 2 sprays into both nostrils daily.     Vitamin Mixture (ESTER-C PO) Take 1,000 mg by mouth daily.     No facility-administered medications prior to visit.    Allergies  Allergen Reactions   Cephalosporins     Blisters  orally   Gabapentin     REACTION: rash   Levofloxacin     REACTION: stomach ache and rash   Psyllium     REACTION: rash   Sulfonamide Derivatives Anaphylaxis    REACTION: shock, urticaria   Aspirin     REACTION: stomach pain   Belladonna    Ciprofloxacin Hcl    Conjugated Estrogens    Cymbopogon    Doxycycline      Abdominal pain   Flovent  Hfa [Fluticasone ]    Nabumetone    Nsaids    Soybean-Containing Drug Products Other (See Comments)    GI upset   Zicam Cold Remedy [Homeopathic Products]    Zocor  [Simvastatin ] Itching   Nickel Itching and Rash   Paba Derivatives Itching and Rash   Prednisone  Rash    NO PROBLEM WITH MEDROL  DOSE PAK Oral prednisone   caused facial burning, made my face feel like it was on fire    Review of Systems  Constitutional:  Negative for fever and malaise/fatigue.  HENT:  Negative for congestion.   Eyes:  Negative for blurred vision.  Respiratory:  Negative for cough and shortness of breath.   Cardiovascular:  Negative for chest pain, palpitations and leg swelling.  Gastrointestinal:  Negative for vomiting.  Musculoskeletal:  Negative for back pain.  Skin:  Negative for rash.  Neurological:  Negative for loss of consciousness and headaches.       Objective:    Physical Exam Vitals and nursing note reviewed.  Skin:    Comments: L ring finger---  5 sutures removed with no complication No signs of infection, no drainage, no swelling      BP 138/88 (BP Location: Left Arm, Patient Position: Sitting, Cuff Size: Normal)   Pulse (!) 57   Temp 98.2 F (36.8 C) (Oral)    Resp 18   Ht 5' 0.5 (1.537 m)   Wt 147 lb 6.4 oz (66.9 kg)   SpO2 100%   BMI 28.31 kg/m  Wt Readings from Last 3 Encounters:  04/27/24 147 lb 6.4 oz (66.9 kg)  04/17/24 145 lb (65.8 kg)  03/26/24 145 lb (65.8 kg)    Diabetic Foot Exam - Simple   No data filed    Lab Results  Component Value Date   WBC 6.7 12/31/2023   HGB 13.7 12/31/2023   HCT 41.2 12/31/2023   PLT 175.0 12/31/2023   GLUCOSE 90 12/31/2023   CHOL 224 (H) 06/17/2023   TRIG 82.0 06/17/2023   HDL 74.80 06/17/2023   LDLDIRECT 153.3 12/18/2012   LDLCALC 133 (H) 06/17/2023   ALT 16 12/31/2023   AST 20 12/31/2023   NA 136 12/31/2023   K 4.2 12/31/2023   CL 100 12/31/2023   CREATININE 0.65 12/31/2023   BUN 16 12/31/2023   CO2 29 12/31/2023   TSH 2.21 06/17/2023   INR 1.2 (H) 01/08/2023   HGBA1C 5.4 11/01/2011    Lab Results  Component Value Date   TSH 2.21 06/17/2023   Lab Results  Component Value Date   WBC 6.7 12/31/2023   HGB 13.7 12/31/2023   HCT 41.2 12/31/2023   MCV 93.6 12/31/2023   PLT 175.0 12/31/2023   Lab Results  Component Value Date   NA 136 12/31/2023   K 4.2 12/31/2023   CO2 29 12/31/2023   GLUCOSE 90 12/31/2023   BUN 16 12/31/2023   CREATININE 0.65 12/31/2023   BILITOT 0.7 12/31/2023   ALKPHOS 97 12/31/2023   AST 20 12/31/2023   ALT 16 12/31/2023   PROT 6.9 12/31/2023   ALBUMIN 4.5 12/31/2023   CALCIUM  9.3 12/31/2023   ANIONGAP 9 06/09/2022   GFR 84.39 12/31/2023   Lab Results  Component Value Date   CHOL 224 (H) 06/17/2023   Lab Results  Component Value Date   HDL 74.80 06/17/2023   Lab Results  Component Value Date   LDLCALC 133 (H) 06/17/2023   Lab Results  Component Value Date   TRIG 82.0 06/17/2023   Lab Results  Component Value Date   CHOLHDL 3 06/17/2023   Lab Results  Component Value Date   HGBA1C 5.4 11/01/2011       Assessment & Plan:  Laceration of left ring finger without foreign body without damage to nail, subsequent  encounter Assessment & Plan: Sutures removed Abx ointment and bandaid put in place Return to office prm  Tdap updated in ER     Kayla JONELLE Antonio Cyndee, DO

## 2024-04-27 NOTE — Patient Instructions (Signed)
Suture Removal, Care After The following information offers guidance on how to care for yourself after your procedure. Your health care provider may also give you more specific instructions. If you have problems or questions, contact your health care provider. What can I expect after the procedure? After your stitches (sutures) are removed, it is common to have: Some discomfort and swelling in the area. Slight redness in the area. Follow these instructions at home: If you have a dressing: Wash your hands with soap and water for at least 20 seconds before and after you change your bandage (dressing). If soap and water are not available, use hand sanitizer. Change your dressing as told by your health care provider. If your dressing becomes wet or dirty, or develops a bad smell, change it as soon as possible. If your dressing sticks to your skin, pour warm, clean water over it until it loosens and can be removed without pulling apart the wound edges. Pat the area dry with a soft, clean towel. Do not rub the wound because that may cause bleeding. Wound care  Check your wound every day for signs of infection. Check for: More redness, swelling, or pain. Fluid or blood. New warmth, a rash, or hardness at the wound site. Pus or a bad smell. Wash your hands with soap and water for at least 20 seconds before and after touching your wound. If soap and water are not available, use hand sanitizer. Keep the wound area dry and clean. Clean and pat the wound dry as told by your health care provider. Apply cream or ointment only as told by your health care provider. If skin glue or adhesive strips were applied after sutures were removed, leave these closures in place. They may need to stay in place for 2 weeks or longer. If adhesive strip edges start to loosen and curl up, you may trim the loose edges. Do not remove adhesive strips completely unless your health care provider tells you to do that. Continue to  protect the wound from injury. Do not pick at your wound. Picking can cause an infection. Bathing Do not take baths, swim, or use a hot tub until your health care provider approves. Ask your health care provider if you may take showers. Follow these steps for showering: If you have a dressing, remove it before getting into the shower. In the shower, allow soapy water to get on the wound. Avoid scrubbing the wound. When you get out of the shower, dry the wound by patting it with a clean towel. Reapply a dressing over the wound, if needed. Scar care When your wound has completely healed, help decrease the size of your scar by: Wearing sunscreen over the scar or covering it with clothing when you are outside. New scars get sunburned easily, which can make scarring worse. Gently massaging the scarred area. This can decrease scar thickness. General instructions Take over-the-counter and prescription medicines only as told by your health care provider. Keep all follow-up visits. This is important. Contact a health care provider if: You have more redness, swelling, or pain around your wound. You have fluid or blood coming from your wound. You have new warmth, a rash, or hardness at the wound site. You have pus or a bad smell coming from your wound. Your wound opens up. Get help right away if: You have a fever or chills. You have red streaks coming from your wound. Summary After your sutures are removed, it is common to have some discomfort  and swelling in the area. Wash your hands with soap and water before you change your bandage (dressing). Keep the wound area dry and clean. Do not take baths, swim, or use a hot tub until your health care provider approves. This information is not intended to replace advice given to you by your health care provider. Make sure you discuss any questions you have with your health care provider. Document Revised: 12/20/2020 Document Reviewed:  12/20/2020 Elsevier Patient Education  2024 [employer].

## 2024-04-27 NOTE — Assessment & Plan Note (Signed)
 Sutures removed Abx ointment and bandaid put in place Return to office prm  Tdap updated in ER

## 2024-04-29 ENCOUNTER — Encounter (HOSPITAL_BASED_OUTPATIENT_CLINIC_OR_DEPARTMENT_OTHER): Payer: Self-pay | Admitting: Emergency Medicine

## 2024-04-29 ENCOUNTER — Emergency Department (HOSPITAL_BASED_OUTPATIENT_CLINIC_OR_DEPARTMENT_OTHER)
Admission: EM | Admit: 2024-04-29 | Discharge: 2024-04-29 | Disposition: A | Discharge status: Home / Self Care | Attending: Emergency Medicine | Admitting: Emergency Medicine

## 2024-04-29 ENCOUNTER — Telehealth: Payer: Self-pay | Admitting: Physician Assistant

## 2024-04-29 ENCOUNTER — Other Ambulatory Visit: Payer: Self-pay

## 2024-04-29 ENCOUNTER — Emergency Department (HOSPITAL_BASED_OUTPATIENT_CLINIC_OR_DEPARTMENT_OTHER)

## 2024-04-29 DIAGNOSIS — M1120 Other chondrocalcinosis, unspecified site: Secondary | ICD-10-CM | POA: Diagnosis not present

## 2024-04-29 DIAGNOSIS — Z79899 Other long term (current) drug therapy: Secondary | ICD-10-CM | POA: Diagnosis not present

## 2024-04-29 DIAGNOSIS — R768 Other specified abnormal immunological findings in serum: Secondary | ICD-10-CM | POA: Diagnosis not present

## 2024-04-29 DIAGNOSIS — R002 Palpitations: Secondary | ICD-10-CM | POA: Insufficient documentation

## 2024-04-29 DIAGNOSIS — M797 Fibromyalgia: Secondary | ICD-10-CM | POA: Diagnosis not present

## 2024-04-29 DIAGNOSIS — M1991 Primary osteoarthritis, unspecified site: Secondary | ICD-10-CM | POA: Diagnosis not present

## 2024-04-29 DIAGNOSIS — I10 Essential (primary) hypertension: Secondary | ICD-10-CM | POA: Diagnosis not present

## 2024-04-29 DIAGNOSIS — E871 Hypo-osmolality and hyponatremia: Secondary | ICD-10-CM | POA: Diagnosis not present

## 2024-04-29 DIAGNOSIS — L52 Erythema nodosum: Secondary | ICD-10-CM | POA: Diagnosis not present

## 2024-04-29 LAB — CBC
HCT: 40.5 % (ref 36.0–46.0)
Hemoglobin: 13.6 g/dL (ref 12.0–15.0)
MCH: 31.2 pg (ref 26.0–34.0)
MCHC: 33.6 g/dL (ref 30.0–36.0)
MCV: 92.9 fL (ref 80.0–100.0)
Platelets: 153 K/uL (ref 150–400)
RBC: 4.36 MIL/uL (ref 3.87–5.11)
RDW: 13.4 % (ref 11.5–15.5)
WBC: 5.8 K/uL (ref 4.0–10.5)
nRBC: 0 % (ref 0.0–0.2)

## 2024-04-29 LAB — TROPONIN T, HIGH SENSITIVITY
Troponin T High Sensitivity: <15 ng/L (ref 0–19)
Troponin T High Sensitivity: <15 ng/L (ref 0–19)

## 2024-04-29 LAB — BASIC METABOLIC PANEL WITH GFR
Anion gap: 13 (ref 5–15)
BUN: 17 mg/dL (ref 8–23)
CO2: 24 mmol/L (ref 22–32)
Calcium: 9.7 mg/dL (ref 8.9–10.3)
Chloride: 98 mmol/L (ref 98–111)
Creatinine, Ser: 1.03 mg/dL — ABNORMAL HIGH (ref 0.44–1.00)
GFR, Estimated: 55 mL/min — ABNORMAL LOW (ref >60)
Glucose, Bld: 104 mg/dL — ABNORMAL HIGH (ref 70–99)
Potassium: 4 mmol/L (ref 3.5–5.1)
Sodium: 134 mmol/L — ABNORMAL LOW (ref 135–145)

## 2024-04-29 NOTE — ED Notes (Signed)
RN provided AVS using Teachback Method. Patient verbalizes understanding of Discharge Instructions. Opportunity for Questioning and Answers were provided by RN. Patient Discharged from ED ambulatory to home with family. ? ?

## 2024-04-29 NOTE — Telephone Encounter (Signed)
   The patient called the answering service after-hours today. History reviewed  She was getting ready to go to a church meeting and felt her usual palpitations, though lasted longer than usual, about 30 mins. She did her Kardia mobile and got 105 for HR the first time, 97 the second time, both listed as unclassified. The 3rd tracing showed NSR 67bpm per her report. Her initial BP was 182/68. She drank a glass of water and tried to relax and this came down to 163/73, consistent with value at last OV with Dr. Lavona. She reports it was a little high at rheumatology today as well in the 150s/80s but she had not had time to sit down and rest before it was checked. No CP, syncope, SOB, just felt anxious with the palpitations. She takes metoprolol  25mg  BID. She took her AM dose about 12 hours ago, usually takes the PM dose closer to 7:30pm.. I told her that she could go ahead and take the PM dose now to get that in her system and try to take it easy tonight. We discussed monitoring for recurrent symptoms at home, but she shares that she continues to feel uneasy and would prefer being checked out so plans to go to urgent care this PM. Note HR typically 50s, and she also reports intolerance to the stickers for the previous event monitor. I will route to Dr. Lavona for review tomorrow and further advisement.  Kayla Purnell N Kaysi Ourada, PA-C

## 2024-04-29 NOTE — ED Triage Notes (Signed)
 Pt c/o palpitations since this afternoon. Took bp at home while feeling the palpitations, which was elevated.   Also reports weakness and a headache. Denies palpitations at this time.

## 2024-04-29 NOTE — Discharge Instructions (Signed)
 Hold your metoprolol  until you speak with your cardiologist.  Follow-up with your cardiologist later this week, and return to the ER if symptoms significantly worsen or change.

## 2024-04-29 NOTE — ED Provider Notes (Signed)
 [facility] Provider Note   CSN: [id number] Arrival date & time: 04/29/24  1914     Patient presents with: Palpitations, Weakness, and Headache   Kayla [name] is a 78 y.o. female.   Patient is a 78 year old female with history of hypertension, palpitations, fibromyalgia.  Patient presenting today with complaints of palpitations.  Earlier this evening she noticed her heart beating forcefully as if it was going to beat out of her chest.  She took her blood pressure and it was elevated at 180.  She used her Kardia mobile device and did a rhythm strip.  She denies any chest pain or shortness of breath during this episode.       Prior to Admission medications   Medication Sig Start Date End Date Taking? Authorizing Provider  acetaminophen (TYLENOL) 500 MG tablet Take 500 mg by mouth as needed.    [provider]  albuterol  (PROVENTIL ) (2.5 MG/3ML) 0.083% nebulizer solution Take 3 mLs (2.5 mg total) by nebulization every 6 (six) hours as needed for wheezing or shortness of breath. 11/28/23   Drubel, Manuelita, PA-C  albuterol  (VENTOLIN  HFA) 108 (90 Base) MCG/ACT inhaler Inhale 2 puffs into the lungs every 6 (six) hours as needed for wheezing or shortness of breath. 12/03/22   Antonio Meth, Jamee SAUNDERS, DO  amLODipine  (NORVASC ) 2.5 MG tablet Take 1 tablet (2.5 mg total) by mouth daily. 02/17/24   Lowne Chase, Yvonne R, DO  bifidobacterium infantis (ALIGN) capsule Take 1 capsule by mouth daily. 12/29/12   Avram Lupita BRAVO, MD  cholecalciferol (VITAMIN D ) 1000 units tablet Take 3,000 Units by mouth daily. 12/24/16   [provider]  colchicine  0.6 MG tablet Take 0.6 mg by mouth daily.    [provider]  EPINEPHrine  (EPIPEN  2-PAK) 0.3 mg/0.3 mL IJ SOAJ injection As directed 12/31/23   Antonio Meth, Yvonne R, DO  Esomeprazole  Magnesium  (NEXIUM  24HR PO) Take 1 capsule by mouth as needed.    [provider]  famotidine  (PEPCID )  20 MG tablet Take 1 tablet (20 mg total) by mouth 2 (two) times daily. 04/17/24   Beather Delon Gibson, PA  Flaxseed, Linseed, (GROUND FLAX SEEDS PO)  05/26/19   [provider]  hyoscyamine  (LEVSIN  SL) 0.125 MG SL tablet DISSOLVE 1 TABLET(0.125 MG) UNDER THE TONGUE EVERY 6 HOURS AS NEEDED 04/09/24   Avram Lupita BRAVO, MD  latanoprost (XALATAN) 0.005 % ophthalmic solution Place 1 drop into both eyes at bedtime.    [provider]  loratadine (CLARITIN) 10 MG tablet Take 10 mg by mouth daily.    [provider]  metoprolol  tartrate (LOPRESSOR ) 25 MG tablet 1 po bid 06/17/23   Antonio Meth, Yvonne R, DO  montelukast  (SINGULAIR ) 10 MG tablet Take 1 tablet (10 mg total) by mouth at bedtime. 03/23/24   Lowne Chase, Yvonne R, DO  nystatin  cream (MYCOSTATIN ) Apply topically 2 (two) times daily as needed for dry skin. 02/28/24   Lowne Chase, Yvonne R, DO  Polyethyl Glycol-Propyl Glycol (SYSTANE) 0.4-0.3 % GEL ophthalmic gel Place 1 application into both eyes.    [provider]  QVAR  REDIHALER 40 MCG/ACT inhaler Inhale 1 puff into the lungs 2 (two) times daily. 02/28/24   Lowne Chase, Yvonne R, DO  sodium chloride  (OCEAN) 0.65 % SOLN nasal spray Place 1 spray into both nostrils as needed for congestion.    [provider]  triamcinolone  (KENALOG ) 0.1 % Apply 1 application topically 2 (two) times  daily. 08/01/20   Antonio Meth, Yvonne R, DO  Triamcinolone  Acetonide (NASACORT  AQ NA) Place 2 sprays into both nostrils daily.    [provider]  Vitamin Mixture (ESTER-C PO) Take 1,000 mg by mouth daily.    [provider]    Allergies: Cephalosporins, Gabapentin, Levofloxacin, Psyllium, Sulfonamide derivatives, Aspirin, Belladonna, Ciprofloxacin hcl, Conjugated estrogens, Cymbopogon, Doxycycline , Flovent  hfa [fluticasone ], Nabumetone, Nsaids, Soybean-containing drug products, Zicam cold remedy [homeopathic products], Zocor  [simvastatin ], Nickel, Paba  derivatives, and Prednisone     Review of Systems  All other systems reviewed and are negative.   Updated Vital Signs BP (!) 172/60   Pulse (!) 50   Temp 98.4 F (36.9 C) (Oral)   Resp 15   Ht 5' 0.5 (1.537 m)   Wt 64.4 kg   SpO2 100%   BMI 27.28 kg/m   Physical Exam Vitals and nursing note reviewed.  Constitutional:      General: She is not in acute distress.    Appearance: She is well-developed. She is not diaphoretic.  HENT:     Head: Normocephalic and atraumatic.  Cardiovascular:     Rate and Rhythm: Normal rate and regular rhythm.     Heart sounds: No murmur heard.    No friction rub. No gallop.  Pulmonary:     Effort: Pulmonary effort is normal. No respiratory distress.     Breath sounds: Normal breath sounds. No wheezing.  Abdominal:     General: Bowel sounds are normal. There is no distension.     Palpations: Abdomen is soft.     Tenderness: There is no abdominal tenderness.  Musculoskeletal:        General: Normal range of motion.     Cervical back: Normal range of motion and neck supple.  Skin:    General: Skin is warm and dry.  Neurological:     General: No focal deficit present.     Mental Status: She is alert and oriented to person, place, and time.     (all labs ordered are listed, but only abnormal results are displayed) Labs Reviewed  BASIC METABOLIC PANEL WITH GFR - Abnormal; Notable for the following components:      Result Value   Sodium 134 (*)    Glucose, Bld 104 (*)    Creatinine, Ser 1.03 (*)    GFR, Estimated 55 (*)    All other components within normal limits  CBC  TROPONIN T, HIGH SENSITIVITY  TROPONIN T, HIGH SENSITIVITY    EKG: None  Radiology: DG Chest 2 View Result Date: 04/29/2024 CLINICAL DATA:  Palpitation EXAM: CHEST - 2 VIEW COMPARISON:  11/28/2023 FINDINGS: Stable cardiomediastinal silhouette. No acute airspace disease or pleural effusion. Extensive posterior spinal hardware extending from the lower thoracic region  to the lumbar spine. Scoliosis IMPRESSION: No active cardiopulmonary disease. Electronically Signed   By: Luke Bun M.D.   On: 04/29/2024 20:03     Procedures   Medications Ordered in the ED - No data to display                                  Medical Decision Making Amount and/or Complexity of Data Reviewed Labs: ordered. Radiology: ordered.   Patient presenting here with palpitations as described in the HPI.  Patient arrives here with stable vital signs and is afebrile.  Physical examination is unremarkable.  Laboratory studies obtained including CBC, metabolic panel, and troponin x  2, all of which are unremarkable.  Chest x-ray shows no acute process.  At this point, patient is in a sinus rhythm and vitals are stable.  She states she feels much better and I believe can safely be discharged.  Upon reviewing her cardia mobile rhythm strips, it appears as though she is having what could either be atrial bigeminy or possibly Wenckebach.  It is difficult to tell secondary to artifact.     Final diagnoses:  None    ED Discharge Orders     None          Geroldine Berg, MD 04/29/24 2329

## 2024-04-30 ENCOUNTER — Encounter: Payer: Self-pay | Admitting: Cardiology

## 2024-04-30 NOTE — Telephone Encounter (Signed)
 Dr. Lavona spoke with the pt. See patient message from 8/21

## 2024-05-01 ENCOUNTER — Telehealth: Payer: Self-pay | Admitting: Physician Assistant

## 2024-05-01 NOTE — Telephone Encounter (Signed)
 Received call from patient, states she's experienced a random scary cardiovascular episode after returning from ER. She is requesting to speak with a nurse regarding ,has conflicting advice from doctor at ER saying to hold medication metoprolol  (25mg ) to help with palpitations , but patient doesn't feel comfortable holding meds provided by Dr. Lavona. She feels stuck in GI tract, acidic feeling, and abnormal bowel movement. Please advise and review.  Thank you

## 2024-05-04 NOTE — Telephone Encounter (Signed)
 Called and spoke with patient. She reports that she spoke with her cardiologist regarding the heart palpitations she had.She reports that when she had the palpitations she noticed that she had pressure and a raw feeling in the back of her throat. She reports that this sensation has decreased, however she is still having the difficulty swallowing. She had questions regarding the barium swallow test. All questions answered and patient verbalized understanding of instructions. Patient would like to know if she should be taking the Pepcid  BID while she is also taking Nexium  or if she should just take it at hs. Routing to provider for review. Advised patient that provider is out of office today, so it may be tomorrow before we have a response. Verbalized understanding.

## 2024-05-05 NOTE — Telephone Encounter (Signed)
 Called and relayed provide recommendations. She verbalized understanding.

## 2024-05-07 ENCOUNTER — Ambulatory Visit (HOSPITAL_COMMUNITY)
Admission: RE | Admit: 2024-05-07 | Discharge: 2024-05-07 | Disposition: A | Discharge status: Home / Self Care | Source: Ambulatory Visit | Attending: Physician Assistant | Admitting: Physician Assistant

## 2024-05-07 ENCOUNTER — Ambulatory Visit: Payer: Self-pay | Admitting: Physician Assistant

## 2024-05-07 DIAGNOSIS — R1030 Lower abdominal pain, unspecified: Secondary | ICD-10-CM | POA: Diagnosis not present

## 2024-05-07 DIAGNOSIS — R09A2 Foreign body sensation, throat: Secondary | ICD-10-CM | POA: Diagnosis not present

## 2024-05-07 DIAGNOSIS — R1310 Dysphagia, unspecified: Secondary | ICD-10-CM | POA: Diagnosis not present

## 2024-05-07 DIAGNOSIS — K449 Diaphragmatic hernia without obstruction or gangrene: Secondary | ICD-10-CM | POA: Diagnosis not present

## 2024-05-14 DIAGNOSIS — L859 Epidermal thickening, unspecified: Secondary | ICD-10-CM | POA: Diagnosis not present

## 2024-05-14 DIAGNOSIS — D485 Neoplasm of uncertain behavior of skin: Secondary | ICD-10-CM | POA: Diagnosis not present

## 2024-06-05 ENCOUNTER — Ambulatory Visit: Admitting: Physician Assistant

## 2024-06-05 ENCOUNTER — Encounter: Payer: Self-pay | Admitting: Physician Assistant

## 2024-06-05 ENCOUNTER — Other Ambulatory Visit

## 2024-06-05 VITALS — BP 118/66 | HR 50 | Ht 60.0 in | Wt 146.4 lb

## 2024-06-05 DIAGNOSIS — K580 Irritable bowel syndrome with diarrhea: Secondary | ICD-10-CM | POA: Diagnosis not present

## 2024-06-05 DIAGNOSIS — R09A2 Foreign body sensation, throat: Secondary | ICD-10-CM

## 2024-06-05 DIAGNOSIS — R350 Frequency of micturition: Secondary | ICD-10-CM

## 2024-06-05 DIAGNOSIS — R1310 Dysphagia, unspecified: Secondary | ICD-10-CM | POA: Diagnosis not present

## 2024-06-05 DIAGNOSIS — R933 Abnormal findings on diagnostic imaging of other parts of digestive tract: Secondary | ICD-10-CM

## 2024-06-05 DIAGNOSIS — K219 Gastro-esophageal reflux disease without esophagitis: Secondary | ICD-10-CM | POA: Diagnosis not present

## 2024-06-05 DIAGNOSIS — R102 Pelvic and perineal pain: Secondary | ICD-10-CM

## 2024-06-05 NOTE — Progress Notes (Signed)
 Chief Complaint: Follow-up abnormal barium swallow  HPI:    Kayla [name] is a 78 year old female with a past medical history as listed below including fibromyalgia and osteoarthritis as well as erythema nodosum, known to Dr. Avram, who returns to clinic today for follow-up of dysphagia, abdominal pain, gas and diarrhea.    11/09/2008 EGD with white exudate in the proximal esophagus, abnormal mucosa in the total stomach biopsied, polyps multiple fundic gland polyps and otherwise normal.  Biopsies were benign and unrevealing, no increased eosinophils.  Did show mild chronic gastritis.    01/10/2022 CT abdomen pelvis with contrast showed high-grade sigmoid diverticulosis with proximal to mid sigmoid focal acute diverticulitis, no perforation or abscess.  Patient given Augmentin  and Flagyl .    03/20/2022 colonoscopy for follow-up of diverticulitis with 2 diminutive polyps in the transverse colon, severe diverticulosis in the sigmoid colon and internal hemorrhoids.  Pathology showed inflammatory polyp.  No repeat recommended due to age.    12/31/23 CBC with neutrophils elevated 79.2%, otherwise normal, normal CMP and vitamin D .    02/06/2024 patient saw a rheumatology in regards to follow-up for fibromyalgia and osteoarthritis as well as erythema nodosum.  She was doing well.  Given Colchicine  as needed.    02/17/2024 CBC normal.  CRP less than 1.  CMP normal.    04/09/2024 patient called in and described for the past 2- 3 weeks she has been having indigestion and feels like food is stuck and also some discomfort in her lower colon.  She started taking over-the-counter Nexium  and felt like it helped some.  Told to take hyoscyamine  for lower discomfort.    04/17/2024 patient seen in clinic and described a constellation of issues over the past few weeks.  Noticed a slight change in bowel habits to thinner.  Upper GI symptoms as well as described a feeling of something just sitting in her esophagus.  At that visit gave  her some Hyoscyamine  as needed.  Recommended fiber.  She was scared of laxatives.  Increase Pepcid  to 20 mg twice a day.  She preferred not to use PPIs given osteoporosis.  Ordered barium esophagram with tablet.    04/29/2024 patient seen in the ER for palpitations.  At that time thought to have atrial bigeminy or possibly Wenckebach.    05/07/2024 barium esophagram with delayed passage of a 13 mm barium tablet distal esophagus or gastroesophageal junction.  No cause identified.  No hiatal hernia.    Today, the patient tells me that she is doing better with her Nexium  20 mg once daily and Famotidine  20 mg twice a day, she actually has not had any further episodes of dysphagia and is trying to make sure she is swallowing well but she does describe a burning soreness in her throat that occurs 2-3 times a day that she cannot correlate with anything that she is eating or doing differently.  This worries her slightly.  She is not sure what else she can do.  Again reminds me she does not want to increase her PPI due to osteoporosis.    Also discusses some suprapubic discomfort today.  Apparently has a long history of UTIs in the past, the last required a Gentamicin shot given her various allergies and the fact that Nitrofurantoin  does not work.    Denies or weight loss.  Past Medical History:  Diagnosis Date   Abscess of left groin 11/10/2014   Allergy     Arthritis    Asthma    Cataract  2015   Very mild   Cellulitis 10/20/2014   Diverticulitis    Diverticulosis    Fasting hyperglycemia 04/2011   FBS 108   Fibromyalgia    Fundic gland polyps of stomach, benign    GERD (gastroesophageal reflux disease)    gastric polyp x3   Glaucoma     Dr Camillo   Hypertension    IBS (irritable bowel syndrome)    Osteopenia    last 07/2011   Pneumonia     OP as child   Shingles     Past Surgical History:  Procedure Laterality Date   COLONOSCOPY W/ POLYPECTOMY  1993,2002,2007   last  colonoscopy 2007, Dr  Avram   DILATION AND CURETTAGE OF UTERUS     ESOPHAGOGASTRODUODENOSCOPY     SPINE SURGERY     T9-L5 fusions    Current Outpatient Medications  Medication Sig Dispense Refill   acetaminophen (TYLENOL) 500 MG tablet Take 500 mg by mouth as needed.     albuterol  (PROVENTIL ) (2.5 MG/3ML) 0.083% nebulizer solution Take 3 mLs (2.5 mg total) by nebulization every 6 (six) hours as needed for wheezing or shortness of breath. 150 mL 1   albuterol  (VENTOLIN  HFA) 108 (90 Base) MCG/ACT inhaler Inhale 2 puffs into the lungs every 6 (six) hours as needed for wheezing or shortness of breath. 18 g 5   amLODipine  (NORVASC ) 2.5 MG tablet Take 1 tablet (2.5 mg total) by mouth daily. 90 tablet 1   bifidobacterium infantis (ALIGN) capsule Take 1 capsule by mouth daily. 21 capsule 0   cholecalciferol (VITAMIN D ) 1000 units tablet Take 3,000 Units by mouth daily.     colchicine  0.6 MG tablet Take 0.6 mg by mouth daily.     EPINEPHrine  (EPIPEN  2-PAK) 0.3 mg/0.3 mL IJ SOAJ injection As directed 1 each 0   Esomeprazole  Magnesium  (NEXIUM  24HR PO) Take 1 capsule by mouth as needed.     famotidine  (PEPCID ) 20 MG tablet Take 1 tablet (20 mg total) by mouth 2 (two) times daily. 60 tablet 5   Flaxseed, Linseed, (GROUND FLAX SEEDS PO)      hyoscyamine  (LEVSIN  SL) 0.125 MG SL tablet DISSOLVE 1 TABLET(0.125 MG) UNDER THE TONGUE EVERY 6 HOURS AS NEEDED 10 tablet 0   latanoprost (XALATAN) 0.005 % ophthalmic solution Place 1 drop into both eyes at bedtime.     loratadine (CLARITIN) 10 MG tablet Take 10 mg by mouth daily.     metoprolol  tartrate (LOPRESSOR ) 25 MG tablet 1 po bid 180 tablet 3   montelukast  (SINGULAIR ) 10 MG tablet Take 1 tablet (10 mg total) by mouth at bedtime. 90 tablet 1   nystatin  cream (MYCOSTATIN ) Apply topically 2 (two) times daily as needed for dry skin.     Polyethyl Glycol-Propyl Glycol (SYSTANE) 0.4-0.3 % GEL ophthalmic gel Place 1 application into both eyes.     QVAR  REDIHALER 40 MCG/ACT inhaler  Inhale 1 puff into the lungs 2 (two) times daily.     sodium chloride  (OCEAN) 0.65 % SOLN nasal spray Place 1 spray into both nostrils as needed for congestion.     triamcinolone  (KENALOG ) 0.1 % Apply 1 application topically 2 (two) times daily. 90 g 3   Triamcinolone  Acetonide (NASACORT  AQ NA) Place 2 sprays into both nostrils daily.     Vitamin Mixture (ESTER-C PO) Take 1,000 mg by mouth daily.     No current facility-administered medications for this visit.    Allergies as of 06/05/2024 - Review Complete 04/29/2024  Allergen Reaction Noted   Cephalosporins  05/24/2011   Gabapentin     Levofloxacin     Psyllium  11/08/2008   Sulfonamide derivatives Anaphylaxis    Aspirin     Belladonna  04/02/2007   Ciprofloxacin hcl  05/24/2011   Conjugated estrogens     Cymbopogon  03/20/2022   Doxycycline   02/27/2016   Flovent  hfa [fluticasone ]  09/11/2018   Nabumetone     Nsaids  05/24/2011   Soybean-containing drug products Other (See Comments) 11/10/2014   Zicam cold remedy [homeopathic products]  12/01/2012   Zocor  [simvastatin ] Itching 10/09/2016   Nickel Itching and Rash 12/22/2020   Paba derivatives Itching and Rash 03/20/2022   Prednisone  Rash     Family History  Problem Relation Age of Onset   Breast cancer Mother    Heart failure Father        CHF, CABG late 35s   Breast cancer Sister    Breast cancer Maternal Aunt        two   Heart disease Maternal Aunt    Breast cancer Paternal Aunt    Asthma Paternal Aunt    Coronary artery disease Paternal Aunt        triple CABG   Stomach cancer Paternal Uncle    Heart attack Paternal Uncle         MI in 40s   Kidney disease Maternal Grandmother    Heart attack Paternal Grandmother        MI in late 63s   Heart disease Paternal Grandfather    Pulmonary embolism Son    Deep vein thrombosis Son    Heart Problems Son        Heart attacks related to a blood disorder    Ovarian cancer Other        Niece x2   Breast cancer  Other        niece   Diabetes Neg Hx    Stroke Neg Hx    COPD Neg Hx    Colon polyps Neg Hx    Colon cancer Neg Hx    Esophageal cancer Neg Hx    Rectal cancer Neg Hx     Social History   Socioeconomic History   Marital status: Widowed    Spouse name: Not on file   Number of children: Not on file   Years of education: Not on file   Highest education level: Some college, no degree  Occupational History   Occupation: retired  Tobacco Use   Smoking status: Former    Current packs/day: 0.00    Average packs/day: 0.5 packs/day for 10.0 years (5.0 ttl pk-yrs)    Types: Cigarettes    Start date: 09/10/1964    Quit date: 09/10/1974    Years since quitting: 49.7   Smokeless tobacco: Never   Tobacco comments:    smoked 1966- 1976, up to 1 ppd  Vaping Use   Vaping status: Never Used  Substance and Sexual Activity   Alcohol use: No    Alcohol/week: 0.0 standard drinks of alcohol   Drug use: No   Sexual activity: Not Currently  Other Topics Concern   Not on file  Social History Narrative   Daily caffeine 3 cups   Regular exercise   Married         Social Drivers of Health   Financial Resource Strain: Low Risk  (02/28/2024)   Overall Financial Resource Strain (CARDIA)    Difficulty of Paying Living Expenses: Not very  hard  Food Insecurity: No Food Insecurity (02/27/2024)   Hunger Vital Sign    Worried About Running Out of Food in the Last Year: Never true    Ran Out of Food in the Last Year: Never true  Transportation Needs: No Transportation Needs (02/27/2024)   PRAPARE - Administrator, Civil Service (Medical): No    Lack of Transportation (Non-Medical): No  Physical Activity: Insufficiently Active (02/27/2024)   Exercise Vital Sign    Days of Exercise per Week: 3 days    Minutes of Exercise per Session: 30 min  Stress: No Stress Concern Present (02/27/2024)   [employer] of Occupational Health - Occupational Stress Questionnaire    Feeling of Stress:  Not at all  Social Connections: Moderately Integrated (02/27/2024)   Social Connection and Isolation Panel    Frequency of Communication with Friends and Family: Three times a week    Frequency of Social Gatherings with Friends and Family: Three times a week    Attends Religious Services: More than 4 times per year    Active Member of Clubs or Organizations: Yes    Attends Banker Meetings: More than 4 times per year    Marital Status: Widowed  Intimate Partner Violence: Not At Risk (02/28/2024)   Humiliation, Afraid, Rape, and Kick questionnaire    Fear of Current or Ex-Partner: No    Emotionally Abused: No    Physically Abused: No    Sexually Abused: No    Review of Systems:    Constitutional: No weight loss, fever or chills Cardiovascular: No chest pain Respiratory: No SOB  Gastrointestinal: See HPI and otherwise negative   Physical Exam:  Vital signs: BP 118/66   Pulse (!) 50   Ht 5' (1.524 m)   Wt 146 lb 6 oz (66.4 kg)   BMI 28.59 kg/m    Constitutional:   Pleasant elderly Caucasian female appears to be in NAD, Well developed, Well nourished, alert and cooperative Respiratory: Respirations even and unlabored. Lungs clear to auscultation bilaterally.   No wheezes, crackles, or rhonchi.  Cardiovascular: Normal S1, S2. No MRG. Regular rate and rhythm. No peripheral edema, cyanosis or pallor.  Gastrointestinal:  Soft, nondistended, mild to moderate suprapubic TTP, no rebound or guarding. Normal bowel sounds. No appreciable masses or hepatomegaly. Rectal:  Not performed.  Psychiatric: Demonstrates good judgement and reason without abnormal affect or behaviors.  RELEVANT LABS AND IMAGING: CBC    Component Value Date/Time   WBC 5.8 04/29/2024 1930   RBC 4.36 04/29/2024 1930   HGB 13.6 04/29/2024 1930   HCT 40.5 04/29/2024 1930   PLT 153 04/29/2024 1930   MCV 92.9 04/29/2024 1930   MCH 31.2 04/29/2024 1930   MCHC 33.6 04/29/2024 1930   RDW 13.4 04/29/2024  1930   LYMPHSABS 0.7 12/31/2023 1129   MONOABS 0.6 12/31/2023 1129   EOSABS 0.1 12/31/2023 1129   BASOSABS 0.0 12/31/2023 1129    CMP     Component Value Date/Time   NA 134 (L) 04/29/2024 1930   NA 137 10/03/2020 1517   K 4.0 04/29/2024 1930   CL 98 04/29/2024 1930   CO2 24 04/29/2024 1930   GLUCOSE 104 (H) 04/29/2024 1930   BUN 17 04/29/2024 1930   BUN 15 10/03/2020 1517   CREATININE 1.03 (H) 04/29/2024 1930   CREATININE 0.74 06/20/2020 1017   CALCIUM  9.7 04/29/2024 1930   PROT 6.9 12/31/2023 1129   ALBUMIN 4.5 12/31/2023 1129   AST 20  12/31/2023 1129   ALT 16 12/31/2023 1129   ALKPHOS 97 12/31/2023 1129   BILITOT 0.7 12/31/2023 1129   GFRNONAA 55 (L) 04/29/2024 1930   GFRNONAA 77 01/04/2017 1422   GFRAA 99 10/03/2020 1517   GFRAA 88 01/04/2017 1422    Assessment: 1.  Globus sensation: Occasional feeling of something sticking in her esophagus, last EGD in 2010 with exudate negative for Candida, recent barium esophagram with delayed passage of tablet but no obvious stricture; consider stricture versus web versus dysmotility versus other 2.  GERD: With above, Famotidine  increased to twice daily at last visit, continues with a burning sensation in her throat at times 3.  IBS-D: Patient has had for whole life battling with changes in bowel habits off-and-on with abdominal discomfort, some more weeks are worse, last colonoscopy 2023 with an inflammatory polyp and otherwise normal 4.  Suprapubic pain: High suspicion for UTI, patient has complicated history of UTIs and is allergic to multiple antibiotics, if she does have a UTI would need to be referred to [facility] urology for possible gentamicin injection  Plan: 1.  Order urinalysis with reflex culture 2.  Scheduled patient for a diagnostic EGD with possible dilation in the [facility] with Dr. Avram.  Did provide the patient with a detailed list of risks for the procedure and she agrees to proceed. Patient is appropriate for endoscopic  procedure(s) in the ambulatory ([facility]) setting.  3.  Continue Nexium  20 mg daily and Famotidine  20 mg twice daily 4.  Patient to follow in clinic per recommendations after time of procedure.  Delon Failing, PA-C [facility] 06/05/2024, 11:48 AM  Cc: Antonio Meth, Jamee SAUNDERS, *

## 2024-06-05 NOTE — Patient Instructions (Signed)
 Your provider has requested that you go to the basement level for lab work before leaving today. Press B on the elevator. The lab is located at the first door on the left as you exit the elevator.  Continue Nexium  and Pepcid .   You have been scheduled for an endoscopy. Please follow written instructions given to you at your visit today.  If you use inhalers (even only as needed), please bring them with you on the day of your procedure.  If you take any of the following medications, they will need to be adjusted prior to your procedure:   DO NOT TAKE 7 DAYS PRIOR TO TEST- Trulicity (dulaglutide) Ozempic, Wegovy (semaglutide) Mounjaro (tirzepatide) Bydureon Bcise (exanatide extended release)  DO NOT TAKE 1 DAY PRIOR TO YOUR TEST Rybelsus (semaglutide) Adlyxin (lixisenatide) Victoza (liraglutide) Byetta (exanatide) ___________________________________________________________________________

## 2024-06-06 LAB — URINALYSIS W MICROSCOPIC + REFLEX CULTURE
Bacteria, UA: NONE SEEN /HPF
Bilirubin Urine: NEGATIVE
Glucose, UA: NEGATIVE
Hgb urine dipstick: NEGATIVE
Hyaline Cast: NONE SEEN /LPF
Ketones, ur: NEGATIVE
Leukocyte Esterase: NEGATIVE
Nitrites, Initial: NEGATIVE
Protein, ur: NEGATIVE
RBC / HPF: NONE SEEN /HPF (ref 0–2)
Specific Gravity, Urine: 1.005 (ref 1.001–1.035)
Squamous Epithelial / HPF: NONE SEEN /HPF (ref <=5)
WBC, UA: NONE SEEN /HPF (ref 0–5)
pH: 6.5 (ref 5.0–8.0)

## 2024-06-06 LAB — NO CULTURE INDICATED

## 2024-06-08 ENCOUNTER — Ambulatory Visit: Payer: Self-pay | Admitting: Physician Assistant

## 2024-06-08 NOTE — Progress Notes (Signed)
 Please let the patient know that her urinalysis was negative.  No sign of UTI.  She should discuss this suprapubic pain further with her PCP.  Thanks, JL L

## 2024-06-11 ENCOUNTER — Ambulatory Visit: Admitting: Family Medicine

## 2024-06-11 ENCOUNTER — Encounter: Payer: Self-pay | Admitting: Family Medicine

## 2024-06-11 VITALS — BP 160/80 | HR 74 | Temp 98.0°F | Resp 16 | Ht 60.0 in | Wt 146.0 lb

## 2024-06-11 DIAGNOSIS — Z23 Encounter for immunization: Secondary | ICD-10-CM | POA: Diagnosis not present

## 2024-06-11 DIAGNOSIS — E785 Hyperlipidemia, unspecified: Secondary | ICD-10-CM | POA: Diagnosis not present

## 2024-06-11 DIAGNOSIS — R21 Rash and other nonspecific skin eruption: Secondary | ICD-10-CM | POA: Diagnosis not present

## 2024-06-11 DIAGNOSIS — I10 Essential (primary) hypertension: Secondary | ICD-10-CM | POA: Diagnosis not present

## 2024-06-11 DIAGNOSIS — E559 Vitamin D deficiency, unspecified: Secondary | ICD-10-CM

## 2024-06-11 MED ORDER — METOPROLOL TARTRATE 25 MG PO TABS: ORAL_TABLET | ORAL | 3 refills | Status: AC

## 2024-06-11 MED ORDER — AMLODIPINE BESYLATE 2.5 MG PO TABS: 2.5000 mg | ORAL_TABLET | Freq: Every day | ORAL | 1 refills | Status: DC

## 2024-06-11 NOTE — Progress Notes (Signed)
 Subjective:    Patient ID: Kayla [name], female    DOB: 20-May-1946, 78 y.o.   MRN: [id number]  Chief Complaint  Patient presents with   Abdominal Pain    Says certain foods irritate her bladder she thinks and is causing pain  Says she is feeling fine today     HPI Patient is in today for low abd irritation.   Discussed the use of AI scribe software for clinical note transcription with the patient, who gave verbal consent to proceed.  History of Present Illness Kayla [name] is a 78 year old female with hypertension who presents with gastrointestinal discomfort and elevated blood pressure.  She experiences frequent burping, which has progressed to a constant raw feeling in her throat and occasional sensation of food getting stuck. A barium swallow test indicated that a pill got hung up a couple of times. She reports that an EGD is scheduled at the end of the month. She has been taking Nexium  daily and Pepcid  twice a day.  She had an episode of elevated blood pressure that led to an ER visit, although no specific cause was identified. Her blood pressure readings have varied, with some readings as high as 186/60s. She is currently on amlodipine  and metoprolol . At a recent visit, her blood pressure was 118/68.  She describes a non-painful, irritated feeling at night, initially suspected to be a UTI, but tests came back negative. Consuming dried cranberries and black pepper seems to exacerbate the irritation, so she has avoided these foods.  She has a history of asthma and is using Qvar , two puffs twice a day. She attempted to wean off but experienced a flare-up after exposure to an older church environment.  She reports occasional weakness in her legs, which improves with movement. She has been trying to increase her exercise routine but has been inconsistent due to multiple appointments. She plans to focus on core and leg strength exercises.   Past Medical History:   Diagnosis Date   Abscess of left groin 11/10/2014   Allergy     Arthritis    Asthma    Cataract 2015   Very mild   Cellulitis 10/20/2014   Diverticulitis    Diverticulosis    Fasting hyperglycemia 04/2011   FBS 108   Fibromyalgia    Fundic gland polyps of stomach, benign    GERD (gastroesophageal reflux disease)    gastric polyp x3   Glaucoma     Dr Camillo   Hypertension    IBS (irritable bowel syndrome)    Osteopenia    last 07/2011   Pneumonia     OP as child   Shingles     Past Surgical History:  Procedure Laterality Date   COLONOSCOPY W/ POLYPECTOMY  1993,2002,2007   last  colonoscopy 2007, Dr Avram   DILATION AND CURETTAGE OF UTERUS     ESOPHAGOGASTRODUODENOSCOPY     SPINE SURGERY     T9-L5 fusions    Family History  Problem Relation Age of Onset   Breast cancer Mother    Heart failure Father        CHF, CABG late 77s   Breast cancer Sister    Breast cancer Maternal Aunt        two   Heart disease Maternal Aunt    Breast cancer Paternal Aunt    Asthma Paternal Aunt    Coronary artery disease Paternal Aunt        triple CABG  Stomach cancer Paternal Uncle    Heart attack Paternal Uncle         MI in 21s   Kidney disease Maternal Grandmother    Heart attack Paternal Grandmother        MI in late 69s   Heart disease Paternal Grandfather    Pulmonary embolism Son    Deep vein thrombosis Son    Heart Problems Son        Heart attacks related to a blood disorder    Ovarian cancer Other        Niece x2   Breast cancer Other        niece   Diabetes Neg Hx    Stroke Neg Hx    COPD Neg Hx    Colon polyps Neg Hx    Colon cancer Neg Hx    Esophageal cancer Neg Hx    Rectal cancer Neg Hx     Social History   Socioeconomic History   Marital status: Widowed    Spouse name: Not on file   Number of children: Not on file   Years of education: Not on file   Highest education level: Some college, no degree  Occupational History   Occupation:  retired  Tobacco Use   Smoking status: Former    Current packs/day: 0.00    Average packs/day: 0.5 packs/day for 10.0 years (5.0 ttl pk-yrs)    Types: Cigarettes    Start date: 09/10/1964    Quit date: 09/10/1974    Years since quitting: 49.7   Smokeless tobacco: Never   Tobacco comments:    smoked 1966- 1976, up to 1 ppd  Vaping Use   Vaping status: Never Used  Substance and Sexual Activity   Alcohol use: No    Alcohol/week: 0.0 standard drinks of alcohol   Drug use: No   Sexual activity: Not Currently  Other Topics Concern   Not on file  Social History Narrative   Daily caffeine 3 cups   Regular exercise   Married         Social Drivers of Health   Financial Resource Strain: Low Risk  (02/28/2024)   Overall Financial Resource Strain (CARDIA)    Difficulty of Paying Living Expenses: Not very hard  Food Insecurity: No Food Insecurity (02/27/2024)   Hunger Vital Sign    Worried About Running Out of Food in the Last Year: Never true    Ran Out of Food in the Last Year: Never true  Transportation Needs: No Transportation Needs (02/27/2024)   PRAPARE - Administrator, Civil Service (Medical): No    Lack of Transportation (Non-Medical): No  Physical Activity: Insufficiently Active (02/27/2024)   Exercise Vital Sign    Days of Exercise per Week: 3 days    Minutes of Exercise per Session: 30 min  Stress: No Stress Concern Present (02/27/2024)   [employer] of Occupational Health - Occupational Stress Questionnaire    Feeling of Stress: Not at all  Social Connections: Moderately Integrated (02/27/2024)   Social Connection and Isolation Panel    Frequency of Communication with Friends and Family: Three times a week    Frequency of Social Gatherings with Friends and Family: Three times a week    Attends Religious Services: More than 4 times per year    Active Member of Clubs or Organizations: Yes    Attends Banker Meetings: More than 4 times per year     Marital Status: Widowed  Intimate Partner Violence: Not At Risk (02/28/2024)   Humiliation, Afraid, Rape, and Kick questionnaire    Fear of Current or Ex-Partner: No    Emotionally Abused: No    Physically Abused: No    Sexually Abused: No    Outpatient Medications Prior to Visit  Medication Sig Dispense Refill   acetaminophen (TYLENOL) 500 MG tablet Take 500 mg by mouth as needed.     albuterol  (PROVENTIL ) (2.5 MG/3ML) 0.083% nebulizer solution Take 3 mLs (2.5 mg total) by nebulization every 6 (six) hours as needed for wheezing or shortness of breath. 150 mL 1   albuterol  (VENTOLIN  HFA) 108 (90 Base) MCG/ACT inhaler Inhale 2 puffs into the lungs every 6 (six) hours as needed for wheezing or shortness of breath. 18 g 5   Azelastine  HCl 137 MCG/SPRAY SOLN 2 puffs (1 spray in each nostril) Nasally Twice a day     bifidobacterium infantis (ALIGN) capsule Take 1 capsule by mouth daily. 21 capsule 0   cholecalciferol (VITAMIN D ) 1000 units tablet Take 3,000 Units by mouth daily.     colchicine  0.6 MG tablet Take 0.6 mg by mouth daily.     EPINEPHrine  (EPIPEN  2-PAK) 0.3 mg/0.3 mL IJ SOAJ injection As directed 1 each 0   Esomeprazole  Magnesium  (NEXIUM  24HR PO) Take 1 capsule by mouth as needed.     famotidine  (PEPCID ) 20 MG tablet Take 1 tablet (20 mg total) by mouth 2 (two) times daily. 60 tablet 5   Flaxseed, Linseed, (GROUND FLAX SEEDS PO)      hyoscyamine  (LEVSIN  SL) 0.125 MG SL tablet DISSOLVE 1 TABLET(0.125 MG) UNDER THE TONGUE EVERY 6 HOURS AS NEEDED 10 tablet 0   latanoprost (XALATAN) 0.005 % ophthalmic solution Place 1 drop into both eyes at bedtime.     loratadine (CLARITIN) 10 MG tablet Take 10 mg by mouth daily.     montelukast  (SINGULAIR ) 10 MG tablet Take 1 tablet (10 mg total) by mouth at bedtime. 90 tablet 1   nystatin  cream (MYCOSTATIN ) Apply topically 2 (two) times daily as needed for dry skin.     Polyethyl Glycol-Propyl Glycol (SYSTANE) 0.4-0.3 % GEL ophthalmic gel Place 1  application into both eyes.     QVAR  REDIHALER 40 MCG/ACT inhaler Inhale 1 puff into the lungs 2 (two) times daily.     sodium chloride  (OCEAN) 0.65 % SOLN nasal spray Place 1 spray into both nostrils as needed for congestion.     triamcinolone  (KENALOG ) 0.1 % Apply 1 application topically 2 (two) times daily. 90 g 3   Triamcinolone  Acetonide (NASACORT  AQ NA) Place 2 sprays into both nostrils daily.     Vitamin Mixture (ESTER-C PO) Take 1,000 mg by mouth daily.     amLODipine  (NORVASC ) 2.5 MG tablet Take 1 tablet (2.5 mg total) by mouth daily. 90 tablet 1   metoprolol  tartrate (LOPRESSOR ) 25 MG tablet 1 po bid 180 tablet 3   No facility-administered medications prior to visit.    Allergies  Allergen Reactions   Cephalosporins     Blisters  orally   Gabapentin     REACTION: rash   Levofloxacin     REACTION: stomach ache and rash   Psyllium     REACTION: rash   Sulfonamide Derivatives Anaphylaxis    REACTION: shock, urticaria   Aspirin     REACTION: stomach pain   Belladonna    Ciprofloxacin Hcl    Conjugated Estrogens    Cymbopogon    Doxycycline   Abdominal pain   Flovent  Hfa [Fluticasone ]    Nabumetone    Nsaids    Soybean-Containing Drug Products Other (See Comments)    GI upset   Zicam Cold Remedy [Homeopathic Products]    Zocor  [Simvastatin ] Itching   Nickel Itching and Rash   Paba Derivatives Itching and Rash   Prednisone  Rash    NO PROBLEM WITH MEDROL  DOSE PAK Oral prednisone  caused facial burning, made my face feel like it was on fire    Review of Systems  Constitutional:  Negative for chills, fever and malaise/fatigue.  HENT:  Negative for congestion and hearing loss.   Eyes:  Negative for blurred vision and discharge.  Respiratory:  Negative for cough, sputum production and shortness of breath.   Cardiovascular:  Negative for chest pain, palpitations and leg swelling.  Gastrointestinal:  Negative for abdominal pain, blood in stool, constipation,  diarrhea, heartburn, nausea and vomiting.  Genitourinary:  Negative for dysuria, frequency, hematuria and urgency.  Musculoskeletal:  Negative for back pain, falls and myalgias.  Skin:  Negative for rash.  Neurological:  Negative for dizziness, sensory change, loss of consciousness, weakness and headaches.  Endo/Heme/Allergies:  Negative for environmental allergies. Does not bruise/bleed easily.  Psychiatric/Behavioral:  Negative for depression and suicidal ideas. The patient is not nervous/anxious and does not have insomnia.        Objective:    Physical Exam Vitals and nursing note reviewed.  Constitutional:      General: She is not in acute distress.    Appearance: Normal appearance. She is well-developed.  HENT:     Head: Normocephalic and atraumatic.  Eyes:     General: No scleral icterus.       Right eye: No discharge.        Left eye: No discharge.  Cardiovascular:     Rate and Rhythm: Normal rate and regular rhythm.     Heart sounds: No murmur heard. Pulmonary:     Effort: Pulmonary effort is normal. No respiratory distress.     Breath sounds: Normal breath sounds.  Musculoskeletal:        General: Normal range of motion.     Cervical back: Normal range of motion and neck supple.     Right lower leg: No edema.     Left lower leg: No edema.  Skin:    General: Skin is warm and dry.  Neurological:     Mental Status: She is alert and oriented to person, place, and time.  Psychiatric:        Mood and Affect: Mood normal.        Behavior: Behavior normal.        Thought Content: Thought content normal.        Judgment: Judgment normal.     BP (!) 160/80   Pulse 74   Temp 98 F (36.7 C) (Oral)   Resp 16   Ht 5' (1.524 m)   Wt 146 lb (66.2 kg)   SpO2 97%   BMI 28.51 kg/m  Wt Readings from Last 3 Encounters:  06/11/24 146 lb (66.2 kg)  06/05/24 146 lb 6 oz (66.4 kg)  04/29/24 142 lb (64.4 kg)    Diabetic Foot Exam - Simple   No data filed    Lab  Results  Component Value Date   WBC 5.8 04/29/2024   HGB 13.6 04/29/2024   HCT 40.5 04/29/2024   PLT 153 04/29/2024   GLUCOSE 104 (H) 04/29/2024   CHOL 224 (H)  06/17/2023   TRIG 82.0 06/17/2023   HDL 74.80 06/17/2023   LDLDIRECT 153.3 12/18/2012   LDLCALC 133 (H) 06/17/2023   ALT 16 12/31/2023   AST 20 12/31/2023   NA 134 (L) 04/29/2024   K 4.0 04/29/2024   CL 98 04/29/2024   CREATININE 1.03 (H) 04/29/2024   BUN 17 04/29/2024   CO2 24 04/29/2024   TSH 2.21 06/17/2023   INR 1.2 (H) 01/08/2023   HGBA1C 5.4 11/01/2011    Lab Results  Component Value Date   TSH 2.21 06/17/2023   Lab Results  Component Value Date   WBC 5.8 04/29/2024   HGB 13.6 04/29/2024   HCT 40.5 04/29/2024   MCV 92.9 04/29/2024   PLT 153 04/29/2024   Lab Results  Component Value Date   NA 134 (L) 04/29/2024   K 4.0 04/29/2024   CO2 24 04/29/2024   GLUCOSE 104 (H) 04/29/2024   BUN 17 04/29/2024   CREATININE 1.03 (H) 04/29/2024   BILITOT 0.7 12/31/2023   ALKPHOS 97 12/31/2023   AST 20 12/31/2023   ALT 16 12/31/2023   PROT 6.9 12/31/2023   ALBUMIN 4.5 12/31/2023   CALCIUM  9.7 04/29/2024   ANIONGAP 13 04/29/2024   GFR 84.39 12/31/2023   Lab Results  Component Value Date   CHOL 224 (H) 06/17/2023   Lab Results  Component Value Date   HDL 74.80 06/17/2023   Lab Results  Component Value Date   LDLCALC 133 (H) 06/17/2023   Lab Results  Component Value Date   TRIG 82.0 06/17/2023   Lab Results  Component Value Date   CHOLHDL 3 06/17/2023   Lab Results  Component Value Date   HGBA1C 5.4 11/01/2011       Assessment & Plan:  Essential hypertension -     CBC with Differential/Platelet -     Comprehensive metabolic panel with GFR -     Lipid panel -     TSH -     Metoprolol  Tartrate; 1 po bid  Dispense: 180 tablet; Refill: 3 -     amLODIPine  Besylate; Take 1 tablet (2.5 mg total) by mouth daily.  Dispense: 90 tablet; Refill: 1  Rash and nonspecific skin eruption  Need for  influenza vaccination -     Flu vaccine HIGH DOSE PF(Fluzone Trivalent)  Vitamin D  deficiency -     VITAMIN D  25 Hydroxy (Vit-D Deficiency, Fractures)  Hyperlipidemia, unspecified hyperlipidemia type  Assessment and Plan Assessment & Plan Hypertension   She experiences intermittent elevated blood pressure, with a recent ER visit and home readings reaching 186/60s. A cardiologist recorded 118/68 and noted a possible Winky block. Recheck blood pressure in the office today. Follow up with a cardiologist if blood pressure remains elevated, or with primary care if unable to see a cardiologist.  Asthma   Her symptoms are managed with Qvar  inhaler, two puffs twice daily. A previous weaning attempt was unsuccessful due to a symptom flare-up. Symptoms worsen with outdoor exposure. Continue Qvar  inhaler, two puffs twice daily. Consider reducing to one puff twice daily when symptoms are controlled and environmental triggers are reduced.  Gastroesophageal reflux disease with possible hiatal hernia   She experiences acid reflux, a sensation of food getting stuck, and a raw chest feeling. A barium swallow showed a pill getting stuck, and an EGD is scheduled. Symptoms are managed with daily Nexium  and Pepcid  twice daily. Continue daily Nexium  and Pepcid  twice daily until EGD results are available. Avoid acidic and spicy foods  to manage symptoms.  Lower urinary tract irritation, resolved   Previous bladder irritation was possibly related to dietary intake of spicy foods and black pepper. Symptoms resolved after dietary changes. Avoid spicy foods and black pepper to prevent recurrence. Return for evaluation if symptoms recur.    Roye Gustafson R Lowne Chase, DO

## 2024-06-12 ENCOUNTER — Encounter: Payer: Self-pay | Admitting: Family Medicine

## 2024-06-12 ENCOUNTER — Ambulatory Visit: Payer: Self-pay

## 2024-06-12 LAB — CBC WITH DIFFERENTIAL/PLATELET
Basophils Absolute: 0.1 K/uL (ref 0.0–0.1)
Basophils Relative: 1.2 % (ref 0.0–3.0)
Eosinophils Absolute: 0.1 K/uL (ref 0.0–0.7)
Eosinophils Relative: 1.3 % (ref 0.0–5.0)
HCT: 40.9 % (ref 36.0–46.0)
Hemoglobin: 13.9 g/dL (ref 12.0–15.0)
Lymphocytes Relative: 15.2 % (ref 12.0–46.0)
Lymphs Abs: 0.7 K/uL (ref 0.7–4.0)
MCHC: 34 g/dL (ref 30.0–36.0)
MCV: 92.2 fl (ref 78.0–100.0)
Monocytes Absolute: 0.4 K/uL (ref 0.1–1.0)
Monocytes Relative: 8.9 % (ref 3.0–12.0)
Neutro Abs: 3.4 K/uL (ref 1.4–7.7)
Neutrophils Relative %: 73.4 % (ref 43.0–77.0)
Platelets: 169 K/uL (ref 150.0–400.0)
RBC: 4.43 Mil/uL (ref 3.87–5.11)
RDW: 13.8 % (ref 11.5–15.5)
WBC: 4.7 K/uL (ref 4.0–10.5)

## 2024-06-12 LAB — LIPID PANEL
Cholesterol: 216 mg/dL — ABNORMAL HIGH (ref 0–200)
HDL: 76.2 mg/dL (ref >39.00)
LDL Cholesterol: 118 mg/dL — ABNORMAL HIGH (ref 0–99)
NonHDL: 139.68
Total CHOL/HDL Ratio: 3
Triglycerides: 108 mg/dL (ref 0.0–149.0)
VLDL: 21.6 mg/dL (ref 0.0–40.0)

## 2024-06-12 LAB — VITAMIN D 25 HYDROXY (VIT D DEFICIENCY, FRACTURES): VITD: 46.06 ng/mL (ref 30.00–100.00)

## 2024-06-12 LAB — COMPREHENSIVE METABOLIC PANEL WITH GFR
ALT: 16 U/L (ref 0–35)
AST: 23 U/L (ref 0–37)
Albumin: 4.6 g/dL (ref 3.5–5.2)
Alkaline Phosphatase: 96 U/L (ref 39–117)
BUN: 16 mg/dL (ref 6–23)
CO2: 28 meq/L (ref 19–32)
Calcium: 9.9 mg/dL (ref 8.4–10.5)
Chloride: 100 meq/L (ref 96–112)
Creatinine, Ser: 0.69 mg/dL (ref 0.40–1.20)
GFR: 82.92 mL/min (ref >60.00)
Glucose, Bld: 99 mg/dL (ref 70–99)
Potassium: 4.4 meq/L (ref 3.5–5.1)
Sodium: 136 meq/L (ref 135–145)
Total Bilirubin: 0.4 mg/dL (ref 0.2–1.2)
Total Protein: 7.3 g/dL (ref 6.0–8.3)

## 2024-06-12 LAB — TSH: TSH: 1.8 u[IU]/mL (ref 0.35–5.50)

## 2024-06-12 NOTE — Telephone Encounter (Signed)
 FYI Only or Action Required?: FYI only for provider.  Patient was last seen in primary care on 06/11/2024 by Antonio Meth, Jamee SAUNDERS, DO.  Called Nurse Triage reporting Abdominal Pain.  Symptoms began yesterday.  Interventions attempted: Nothing.  Symptoms are: gradually worsening.  Triage Disposition: See Physician Within 24 Hours  Patient/caregiver understands and will follow disposition?: Yes  Copied from CRM #[id number]. Topic: Clinical - Red Word Triage >> Jun 12, 2024  9:25 AM Wess RAMAN wrote: Red Word that prompted transfer to Nurse Triage: awful abdominal pain last night. bladder irritation, difficulty sleeping due to pain. Reason for Disposition  [1] MILD to MODERATE vaginal pain AND [2] present > 24 hours  (Exception: Chronic pain.)  Answer Assessment - Initial Assessment Questions Please contact the patient at [phone], patient was instructed to reach out to Dr. Meth Antonio for any recurrence in symptoms.   This RN reached out to the [facility], unable to connect to a clinical representative a this time. CRM to be sent to practice and inquiries about scheduling and ongoing plan to be established per representative.   1. SYMPTOM: What's the main symptom you're concerned about? (e.g., pain, itching, dryness)     Pain  2. LOCATION: Where is the  Pain located? (e.g., inside/outside, left/right)     Inside  3. ONSET: When did the  pain  start?     Last night  4. PAIN: Is there any pain? If Yes, ask: How bad is it? (Scale: 1-10; mild, moderate, severe)     Mild to Moderate  5. ITCHING: Is there any itching? If Yes, ask: How bad is it? (Scale: 1-10; mild, moderate, severe)     No  6. CAUSE: What do you think is causing the discharge? Have you had the same problem before? What happened then?     Unsure  7. OTHER SYMPTOMS: Do you have any other symptoms? (e.g., fever, itching, vaginal bleeding, pain with urination, injury to genital area, vaginal foreign  body)     Lower Abdominal Pain, Suprapubic pain  8. PREGNANCY: Is there any chance you are pregnant? When was your last menstrual period?     No and No  Protocols used: Vaginal Symptoms-A-AH

## 2024-06-12 NOTE — Telephone Encounter (Signed)
 See mychart messages

## 2024-06-13 ENCOUNTER — Other Ambulatory Visit: Payer: Self-pay | Admitting: Family Medicine

## 2024-06-13 DIAGNOSIS — R21 Rash and other nonspecific skin eruption: Secondary | ICD-10-CM

## 2024-06-16 ENCOUNTER — Ambulatory Visit: Admitting: Family Medicine

## 2024-06-16 ENCOUNTER — Encounter: Payer: Self-pay | Admitting: Family Medicine

## 2024-06-16 ENCOUNTER — Ambulatory Visit: Payer: Self-pay | Admitting: Family Medicine

## 2024-06-16 VITALS — BP 138/78 | HR 57 | Temp 98.4°F | Resp 18 | Ht 60.0 in | Wt 146.0 lb

## 2024-06-16 DIAGNOSIS — N8110 Cystocele, unspecified: Secondary | ICD-10-CM | POA: Diagnosis not present

## 2024-06-16 LAB — POC URINALSYSI DIPSTICK (AUTOMATED)
Bilirubin, UA: NEGATIVE
Blood, UA: NEGATIVE
Glucose, UA: NEGATIVE
Ketones, UA: NEGATIVE
Leukocytes, UA: NEGATIVE
Nitrite, UA: NEGATIVE
Protein, UA: NEGATIVE
Spec Grav, UA: <=1.005 — AB (ref 1.010–1.025)
Urobilinogen, UA: 0.2 U/dL
pH, UA: 7 (ref 5.0–8.0)

## 2024-06-16 NOTE — Progress Notes (Signed)
 2  Subjective:    Patient ID: Kayla [name], female    DOB: October 14, 1945, 78 y.o.   MRN: [id number]  Chief Complaint  Patient presents with   Abdominal Pain   Follow-up    HPI Patient is in today for hx cystocele.  Discussed the use of AI scribe software for clinical note transcription with the patient, who gave verbal consent to proceed.  History of Present Illness Kayla [name] Kayla [name] is a 78 year old female who presents with symptoms suggestive of bladder prolapse.  She experiences intermittent symptoms of bladder prolapse, including a sensation of her bladder 'coming out' and feeling pressure. These symptoms first appeared several years ago and have been managed with Kegel exercises and manual repositioning of the bladder when necessary. Recently, she has experienced significant discomfort, particularly after urination, which she describes as more than just achy but not resembling urinary tract infection pain. She noticed a 'tiny little bulge' which she manually repositioned, similar to past occurrences. The discomfort has been significant enough to require Tylenol for relief. She reports no pain during urination, but did experience pain after urination, and denies any discharge or rectal symptoms. The discomfort is primarily located in the vaginal area.  Her sister, who has experienced similar issues and undergone surgery, has advised her on management strategies. She has not had any surgeries related to this issue and has not seen a gynecologist since her previous one retired.  She also experiences cramps in her calves and feet, which she attributes to a possible decrease in magnesium  absorption due to her current medications, Nexium  and Pepcid . She is considering adjusting her magnesium  supplementation, currently at 250 mg, to address these cramps.  5  Past Medical History:  Diagnosis Date   Abscess of left groin 11/10/2014   Allergy     Arthritis    Asthma     Cataract 2015   Very mild   Cellulitis 10/20/2014   Diverticulitis    Diverticulosis    Fasting hyperglycemia 04/2011   FBS 108   Fibromyalgia    Fundic gland polyps of stomach, benign    GERD (gastroesophageal reflux disease)    gastric polyp x3   Glaucoma     Dr Camillo   Hypertension    IBS (irritable bowel syndrome)    Osteopenia    last 07/2011   Pneumonia     OP as child   Shingles     Past Surgical History:  Procedure Laterality Date   COLONOSCOPY W/ POLYPECTOMY  1993,2002,2007   last  colonoscopy 2007, Dr Avram   DILATION AND CURETTAGE OF UTERUS     ESOPHAGOGASTRODUODENOSCOPY     SPINE SURGERY     T9-L5 fusions    Family History  Problem Relation Age of Onset   Breast cancer Mother    Heart failure Father        CHF, CABG late 24s   Breast cancer Sister    Breast cancer Maternal Aunt        two   Heart disease Maternal Aunt    Breast cancer Paternal Aunt    Asthma Paternal Aunt    Coronary artery disease Paternal Aunt        triple CABG   Stomach cancer Paternal Uncle    Heart attack Paternal Uncle         MI in 13s   Kidney disease Maternal Grandmother    Heart attack Paternal Grandmother  MI in late 60s   Heart disease Paternal Grandfather    Pulmonary embolism Son    Deep vein thrombosis Son    Heart Problems Son        Heart attacks related to a blood disorder    Ovarian cancer Other        Niece x2   Breast cancer Other        niece   Diabetes Neg Hx    Stroke Neg Hx    COPD Neg Hx    Colon polyps Neg Hx    Colon cancer Neg Hx    Esophageal cancer Neg Hx    Rectal cancer Neg Hx     Social History   Socioeconomic History   Marital status: Widowed    Spouse name: Not on file   Number of children: Not on file   Years of education: Not on file   Highest education level: Some college, no degree  Occupational History   Occupation: retired  Tobacco Use   Smoking status: Former    Current packs/day: 0.00    Average  packs/day: 0.5 packs/day for 10.0 years (5.0 ttl pk-yrs)    Types: Cigarettes    Start date: 09/10/1964    Quit date: 09/10/1974    Years since quitting: 49.8   Smokeless tobacco: Never   Tobacco comments:    smoked 1966- 1976, up to 1 ppd  Vaping Use   Vaping status: Never Used  Substance and Sexual Activity   Alcohol use: No    Alcohol/week: 0.0 standard drinks of alcohol   Drug use: No   Sexual activity: Not Currently  Other Topics Concern   Not on file  Social History Narrative   Daily caffeine 3 cups   Regular exercise   Married         Social Drivers of Health   Financial Resource Strain: Low Risk  (02/28/2024)   Overall Financial Resource Strain (CARDIA)    Difficulty of Paying Living Expenses: Not very hard  Food Insecurity: No Food Insecurity (02/27/2024)   Hunger Vital Sign    Worried About Running Out of Food in the Last Year: Never true    Ran Out of Food in the Last Year: Never true  Transportation Needs: No Transportation Needs (02/27/2024)   PRAPARE - Administrator, Civil Service (Medical): No    Lack of Transportation (Non-Medical): No  Physical Activity: Insufficiently Active (02/27/2024)   Exercise Vital Sign    Days of Exercise per Week: 3 days    Minutes of Exercise per Session: 30 min  Stress: No Stress Concern Present (02/27/2024)   [employer] of Occupational Health - Occupational Stress Questionnaire    Feeling of Stress: Not at all  Social Connections: Moderately Integrated (02/27/2024)   Social Connection and Isolation Panel    Frequency of Communication with Friends and Family: Three times a week    Frequency of Social Gatherings with Friends and Family: Three times a week    Attends Religious Services: More than 4 times per year    Active Member of Clubs or Organizations: Yes    Attends Banker Meetings: More than 4 times per year    Marital Status: Widowed  Intimate Partner Violence: Not At Risk (02/28/2024)    Humiliation, Afraid, Rape, and Kick questionnaire    Fear of Current or Ex-Partner: No    Emotionally Abused: No    Physically Abused: No    Sexually Abused: No  Outpatient Medications Prior to Visit  Medication Sig Dispense Refill   acetaminophen (TYLENOL) 500 MG tablet Take 500 mg by mouth as needed.     albuterol  (PROVENTIL ) (2.5 MG/3ML) 0.083% nebulizer solution Take 3 mLs (2.5 mg total) by nebulization every 6 (six) hours as needed for wheezing or shortness of breath. 150 mL 1   albuterol  (VENTOLIN  HFA) 108 (90 Base) MCG/ACT inhaler Inhale 2 puffs into the lungs every 6 (six) hours as needed for wheezing or shortness of breath. 18 g 5   amLODipine  (NORVASC ) 2.5 MG tablet Take 1 tablet (2.5 mg total) by mouth daily. 90 tablet 1   bifidobacterium infantis (ALIGN) capsule Take 1 capsule by mouth daily. 21 capsule 0   cholecalciferol (VITAMIN D ) 1000 units tablet Take 3,000 Units by mouth daily.     colchicine  0.6 MG tablet Take 0.6 mg by mouth daily.     EPINEPHrine  (EPIPEN  2-PAK) 0.3 mg/0.3 mL IJ SOAJ injection As directed 1 each 0   Esomeprazole  Magnesium  (NEXIUM  24HR PO) Take 1 capsule by mouth as needed.     famotidine  (PEPCID ) 20 MG tablet Take 1 tablet (20 mg total) by mouth 2 (two) times daily. 60 tablet 5   Flaxseed, Linseed, (GROUND FLAX SEEDS PO)      hyoscyamine  (LEVSIN  SL) 0.125 MG SL tablet DISSOLVE 1 TABLET(0.125 MG) UNDER THE TONGUE EVERY 6 HOURS AS NEEDED 10 tablet 0   latanoprost (XALATAN) 0.005 % ophthalmic solution Place 1 drop into both eyes at bedtime.     loratadine (CLARITIN) 10 MG tablet Take 10 mg by mouth daily.     metoprolol  tartrate (LOPRESSOR ) 25 MG tablet 1 po bid 180 tablet 3   montelukast  (SINGULAIR ) 10 MG tablet Take 1 tablet (10 mg total) by mouth at bedtime. 90 tablet 1   nystatin  cream (MYCOSTATIN ) APPLY TOPICALLY TO THE AFFECTED AREA TWICE DAILY 30 g 2   Polyethyl Glycol-Propyl Glycol (SYSTANE) 0.4-0.3 % GEL ophthalmic gel Place 1 application into  both eyes.     QVAR  REDIHALER 40 MCG/ACT inhaler Inhale 1 puff into the lungs 2 (two) times daily.     sodium chloride  (OCEAN) 0.65 % SOLN nasal spray Place 1 spray into both nostrils as needed for congestion.     triamcinolone  (KENALOG ) 0.1 % Apply 1 application topically 2 (two) times daily. 90 g 3   Triamcinolone  Acetonide (NASACORT  AQ NA) Place 2 sprays into both nostrils daily.     Vitamin Mixture (ESTER-C PO) Take 1,000 mg by mouth daily.     Azelastine  HCl 137 MCG/SPRAY SOLN 2 puffs (1 spray in each nostril) Nasally Twice a day     No facility-administered medications prior to visit.    Allergies  Allergen Reactions   Cephalosporins     Blisters  orally   Gabapentin     REACTION: rash   Levofloxacin     REACTION: stomach ache and rash   Psyllium     REACTION: rash   Sulfonamide Derivatives Anaphylaxis    REACTION: shock, urticaria   Aspirin     REACTION: stomach pain   Belladonna    Ciprofloxacin Hcl    Conjugated Estrogens    Cymbopogon    Doxycycline      Abdominal pain   Flovent  Hfa [Fluticasone ]    Nabumetone    Nsaids    Soybean-Containing Drug Products Other (See Comments)    GI upset   Zicam Cold Remedy [Homeopathic Products]    Zocor  [Simvastatin ] Itching   Nickel Itching  and Rash   Paba Derivatives Itching and Rash   Prednisone  Rash    NO PROBLEM WITH MEDROL  DOSE PAK Oral prednisone  caused facial burning, made my face feel like it was on fire    Review of Systems  Constitutional:  Negative for fever and malaise/fatigue.  HENT:  Negative for congestion.   Eyes:  Negative for blurred vision.  Respiratory:  Negative for cough and shortness of breath.   Cardiovascular:  Negative for chest pain, palpitations and leg swelling.  Gastrointestinal:  Negative for abdominal pain, blood in stool, nausea and vomiting.  Genitourinary:  Negative for dysuria and frequency.  Musculoskeletal:  Negative for back pain and falls.  Skin:  Negative for rash.   Neurological:  Negative for dizziness, loss of consciousness and headaches.  Endo/Heme/Allergies:  Negative for environmental allergies.  Psychiatric/Behavioral:  Negative for depression. The patient is not nervous/anxious.        Objective:    Physical Exam Vitals and nursing note reviewed.  Constitutional:      General: She is not in acute distress.    Appearance: Normal appearance. She is well-developed.  HENT:     Head: Normocephalic and atraumatic.  Eyes:     General: No scleral icterus.       Right eye: No discharge.        Left eye: No discharge.  Cardiovascular:     Rate and Rhythm: Normal rate and regular rhythm.     Heart sounds: No murmur heard. Pulmonary:     Effort: Pulmonary effort is normal. No respiratory distress.     Breath sounds: Normal breath sounds.  Abdominal:     General: Bowel sounds are normal. There is no distension or abdominal bruit. There are no signs of injury.     Palpations: Abdomen is soft. There is no mass.     Tenderness: There is no abdominal tenderness. There is no guarding or rebound.  Musculoskeletal:        General: Normal range of motion.     Cervical back: Normal range of motion and neck supple.     Right lower leg: No edema.     Left lower leg: No edema.  Skin:    General: Skin is warm and dry.  Neurological:     Mental Status: She is alert and oriented to person, place, and time.  Psychiatric:        Mood and Affect: Mood normal.        Behavior: Behavior normal.        Thought Content: Thought content normal.        Judgment: Judgment normal.     BP 138/78 (BP Location: Left Arm, Patient Position: Sitting, Cuff Size: Normal)   Pulse (!) 57   Temp 98.4 F (36.9 C) (Oral)   Resp 18   Ht 5' (1.524 m)   Wt 146 lb (66.2 kg)   SpO2 98%   BMI 28.51 kg/m  Wt Readings from Last 3 Encounters:  06/16/24 146 lb (66.2 kg)  06/11/24 146 lb (66.2 kg)  06/05/24 146 lb 6 oz (66.4 kg)    Diabetic Foot Exam - Simple   No data  filed    Lab Results  Component Value Date   WBC 4.7 06/11/2024   HGB 13.9 06/11/2024   HCT 40.9 06/11/2024   PLT 169.0 06/11/2024   GLUCOSE 99 06/11/2024   CHOL 216 (H) 06/11/2024   TRIG 108.0 06/11/2024   HDL 76.20 06/11/2024   LDLDIRECT  153.3 12/18/2012   LDLCALC 118 (H) 06/11/2024   ALT 16 06/11/2024   AST 23 06/11/2024   NA 136 06/11/2024   K 4.4 06/11/2024   CL 100 06/11/2024   CREATININE 0.69 06/11/2024   BUN 16 06/11/2024   CO2 28 06/11/2024   TSH 1.80 06/11/2024   INR 1.2 (H) 01/08/2023   HGBA1C 5.4 11/01/2011    Lab Results  Component Value Date   TSH 1.80 06/11/2024   Lab Results  Component Value Date   WBC 4.7 06/11/2024   HGB 13.9 06/11/2024   HCT 40.9 06/11/2024   MCV 92.2 06/11/2024   PLT 169.0 06/11/2024   Lab Results  Component Value Date   NA 136 06/11/2024   K 4.4 06/11/2024   CO2 28 06/11/2024   GLUCOSE 99 06/11/2024   BUN 16 06/11/2024   CREATININE 0.69 06/11/2024   BILITOT 0.4 06/11/2024   ALKPHOS 96 06/11/2024   AST 23 06/11/2024   ALT 16 06/11/2024   PROT 7.3 06/11/2024   ALBUMIN 4.6 06/11/2024   CALCIUM  9.9 06/11/2024   ANIONGAP 13 04/29/2024   GFR 82.92 06/11/2024   Lab Results  Component Value Date   CHOL 216 (H) 06/11/2024   Lab Results  Component Value Date   HDL 76.20 06/11/2024   Lab Results  Component Value Date   LDLCALC 118 (H) 06/11/2024   Lab Results  Component Value Date   TRIG 108.0 06/11/2024   Lab Results  Component Value Date   CHOLHDL 3 06/11/2024   Lab Results  Component Value Date   HGBA1C 5.4 11/01/2011       Assessment & Plan:  Female cystocele Assessment & Plan: Pt has a history of cystocele Refer to uro gyn Check ua   Orders: -     POCT Urinalysis Dipstick (Automated) -     Ambulatory referral to Urogynecology   Assessment and Plan Assessment & Plan Pelvic organ prolapse (suspected cystocele)   Intermittent pelvic pressure and discomfort with a small bulge at the vaginal  opening suggest a cystocele. She experiences discomfort post-urination but no urinary symptoms typical of a UTI, discharge, or significant pain. Similar symptoms were previously managed with Kegel exercises and manual repositioning. Differential diagnosis includes bladder or uterine prolapse. Refer to a urogynecologist for further evaluation and management, including potential pessary fitting or surgical options. Discuss the risks and benefits of pessary versus surgery, emphasizing the non-invasive nature of pessary and potential for surgical failure as experienced by her sister. Check urine sample for infection. Advise continuation of Kegel exercises.  Leg and foot cramps   Recent onset of cramps in calves and feet may be due to reduced magnesium  intake. Current magnesium  supplementation is 250 mg, lower than previous intake. Consider potential interaction with Nexium  and Pepcid  affecting magnesium  absorption. Increase magnesium  supplementation to 350 mg, with pharmacist's input on safety and potential benefits. Monitor for improvement in cramps.   Marita Burnsed R Lowne Chase, DO

## 2024-06-16 NOTE — Assessment & Plan Note (Signed)
 Pt has a history of cystocele Refer to uro gyn Check ua

## 2024-06-21 ENCOUNTER — Ambulatory Visit: Payer: Self-pay | Admitting: Family Medicine

## 2024-06-22 ENCOUNTER — Telehealth: Payer: Self-pay

## 2024-06-22 DIAGNOSIS — R002 Palpitations: Secondary | ICD-10-CM

## 2024-06-22 DIAGNOSIS — M818 Other osteoporosis without current pathological fracture: Secondary | ICD-10-CM

## 2024-06-22 NOTE — Telephone Encounter (Signed)
 Copied from CRM [id number]. Topic: Clinical - Medical Advice >> Jun 22, 2024 12:58 PM Ashley R wrote: Reason for CRM: Calling to ask if there is a Pharmacist on staff still to discuss changing Magnesium  because the current one has zinc and it is upsetting her mouth. Please callback at [phone]

## 2024-06-24 ENCOUNTER — Other Ambulatory Visit: Payer: Self-pay

## 2024-06-24 MED ORDER — ROSUVASTATIN CALCIUM 10 MG PO TABS: 10.0000 mg | ORAL_TABLET | Freq: Every day | ORAL | 2 refills | Status: DC

## 2024-06-29 DIAGNOSIS — H04123 Dry eye syndrome of bilateral lacrimal glands: Secondary | ICD-10-CM | POA: Diagnosis not present

## 2024-06-29 DIAGNOSIS — H401131 Primary open-angle glaucoma, bilateral, mild stage: Secondary | ICD-10-CM | POA: Diagnosis not present

## 2024-06-29 DIAGNOSIS — H2513 Age-related nuclear cataract, bilateral: Secondary | ICD-10-CM | POA: Diagnosis not present

## 2024-06-30 ENCOUNTER — Encounter: Payer: Self-pay | Admitting: Internal Medicine

## 2024-06-30 NOTE — Addendum Note (Signed)
 Addended by: CARLA MILLING B on: 06/30/2024 01:43 PM   Modules accepted: Orders

## 2024-06-30 NOTE — Telephone Encounter (Signed)
 Patient had several questions about magnesium  supplements. She has a combo she was taking that had magnesium  and zinc but this seemed to irritate her mouth. She has a combo of magnesium  glycinate and citrate. It has 100 IU of each for total dose of 200IU of magnesium .  She is worried about getting too little or too much magnesium .  Recommend she can start the combo magnesium  glycinate + magnesium  citrate once a day. Monitor for any GI issues with citrate form.  She will be due to have labs rechecked in January 2026. Recommend that she check magnesium  with those labs.  She was suppose to start statin per labs notes 06/21/2024 but patient has declined to start statin. She will consider if LDL still elevated in January 2026.    Lab Results  Component Value Date   CHOL 216 (H) 06/11/2024   HDL 76.20 06/11/2024   LDLCALC 118 (H) 06/11/2024   LDLDIRECT 153.3 12/18/2012   TRIG 108.0 06/11/2024   CHOLHDL 3 06/11/2024   Last Lpa < 10 (04/15/20224)  Coronary calcium  score of 0. This was 0 percentile for age and sex matched control.  Madelin Ray, PharmD Clinical Pharmacist [facility]

## 2024-07-06 ENCOUNTER — Encounter: Payer: Self-pay | Admitting: Internal Medicine

## 2024-07-06 ENCOUNTER — Telehealth: Payer: Self-pay | Admitting: Physician Assistant

## 2024-07-06 ENCOUNTER — Ambulatory Visit: Admitting: Family Medicine

## 2024-07-06 NOTE — Progress Notes (Unsigned)
 [facility] History and Physical   Primary Care Physician:  Antonio Meth, Jamee SAUNDERS, DO   Reason for Procedure:    Encounter Diagnoses  Name Primary?   Dysphagia, unspecified type Yes   Abnormal esophagram    Globus sensation      Plan:    EGD, esophageal dilation     HPI: Kayla [name] is a 78 y.o. female seen in the clinic by Delon Failing, PA-C in early October, w/ c/o dysphagia, globus and recent ba swallow w/ delay of passage of tablet at GE junction  2010 EGD negative   Past Medical History:  Diagnosis Date   Abscess of left groin 11/10/2014   Allergy     Arthritis    Asthma    Cataract 2015   Very mild   Cellulitis 10/20/2014   Diverticulitis    Diverticulosis    Fasting hyperglycemia 04/2011   FBS 108   Fibromyalgia    Fundic gland polyps of stomach, benign    GERD (gastroesophageal reflux disease)    gastric polyp x3   Glaucoma     Dr Camillo   Hypertension    IBS (irritable bowel syndrome)    Osteopenia    last 07/2011   Pneumonia     OP as child   Shingles     Past Surgical History:  Procedure Laterality Date   COLONOSCOPY W/ POLYPECTOMY  1993,2002,2007   last  colonoscopy 2007, Dr Avram   DILATION AND CURETTAGE OF UTERUS     ESOPHAGOGASTRODUODENOSCOPY     SPINE SURGERY     T9-L5 fusions     Current Outpatient Medications  Medication Sig Dispense Refill   acetaminophen (TYLENOL) 500 MG tablet Take 500 mg by mouth as needed.     albuterol  (PROVENTIL ) (2.5 MG/3ML) 0.083% nebulizer solution Take 3 mLs (2.5 mg total) by nebulization every 6 (six) hours as needed for wheezing or shortness of breath. 150 mL 1   albuterol  (VENTOLIN  HFA) 108 (90 Base) MCG/ACT inhaler Inhale 2 puffs into the lungs every 6 (six) hours as needed for wheezing or shortness of breath. 18 g 5   amLODipine  (NORVASC ) 2.5 MG tablet Take 1 tablet (2.5 mg total) by mouth daily. 90 tablet 1   bifidobacterium infantis (ALIGN) capsule Take 1 capsule by mouth  daily. 21 capsule 0   cholecalciferol (VITAMIN D ) 1000 units tablet Take 3,000 Units by mouth daily.     colchicine  0.6 MG tablet Take 0.6 mg by mouth daily.     EPINEPHrine  (EPIPEN  2-PAK) 0.3 mg/0.3 mL IJ SOAJ injection As directed 1 each 0   Esomeprazole  Magnesium  (NEXIUM  24HR PO) Take 1 capsule by mouth as needed.     famotidine  (PEPCID ) 20 MG tablet Take 1 tablet (20 mg total) by mouth 2 (two) times daily. 60 tablet 5   Flaxseed, Linseed, (GROUND FLAX SEEDS PO)      hyoscyamine  (LEVSIN  SL) 0.125 MG SL tablet DISSOLVE 1 TABLET(0.125 MG) UNDER THE TONGUE EVERY 6 HOURS AS NEEDED 10 tablet 0   latanoprost (XALATAN) 0.005 % ophthalmic solution Place 1 drop into both eyes at bedtime.     loratadine (CLARITIN) 10 MG tablet Take 10 mg by mouth daily.     metoprolol  tartrate (LOPRESSOR ) 25 MG tablet 1 po bid 180 tablet 3   montelukast  (SINGULAIR ) 10 MG tablet Take 1 tablet (10 mg total) by mouth at bedtime. 90 tablet 1   nystatin  cream (MYCOSTATIN ) APPLY TOPICALLY TO THE AFFECTED AREA TWICE DAILY 30 g  2   Polyethyl Glycol-Propyl Glycol (SYSTANE) 0.4-0.3 % GEL ophthalmic gel Place 1 application into both eyes.     QVAR  REDIHALER 40 MCG/ACT inhaler Inhale 1 puff into the lungs 2 (two) times daily.     rosuvastatin  (CRESTOR ) 10 MG tablet Take 1 tablet (10 mg total) by mouth daily. 30 tablet 2   sodium chloride  (OCEAN) 0.65 % SOLN nasal spray Place 1 spray into both nostrils as needed for congestion.     triamcinolone  (KENALOG ) 0.1 % Apply 1 application topically 2 (two) times daily. 90 g 3   Triamcinolone  Acetonide (NASACORT  AQ NA) Place 2 sprays into both nostrils daily.     Vitamin Mixture (ESTER-C PO) Take 1,000 mg by mouth daily.     No current facility-administered medications for this visit.    Allergies as of 07/07/2024 - Review Complete 07/07/2024  Allergen Reaction Noted   Cephalosporins  05/24/2011   Gabapentin     Levofloxacin     Psyllium  11/08/2008   Sulfonamide derivatives  Anaphylaxis    Aspirin     Belladonna  04/02/2007   Ciprofloxacin hcl  05/24/2011   Conjugated estrogens     Cymbopogon  03/20/2022   Doxycycline   02/27/2016   Flovent  hfa [fluticasone ]  09/11/2018   Nabumetone     Nsaids  05/24/2011   Soybean-containing drug products Other (See Comments) 11/10/2014   Zicam cold remedy [homeopathic products]  12/01/2012   Zocor  [simvastatin ] Itching 10/09/2016   Nickel Itching and Rash 12/22/2020   Paba (para-aminobenzoate) derivatives Itching and Rash 03/20/2022   Prednisone  Rash     Family History  Problem Relation Age of Onset   Breast cancer Mother    Heart failure Father        CHF, CABG late 90s   Breast cancer Sister    Breast cancer Maternal Aunt        two   Heart disease Maternal Aunt    Breast cancer Paternal Aunt    Asthma Paternal Aunt    Coronary artery disease Paternal Aunt        triple CABG   Stomach cancer Paternal Uncle    Heart attack Paternal Uncle         MI in 30s   Kidney disease Maternal Grandmother    Heart attack Paternal Grandmother        MI in late 69s   Heart disease Paternal Grandfather    Pulmonary embolism Son    Deep vein thrombosis Son    Heart Problems Son        Heart attacks related to a blood disorder    Ovarian cancer Other        Niece x2   Breast cancer Other        niece   Diabetes Neg Hx    Stroke Neg Hx    COPD Neg Hx    Colon polyps Neg Hx    Colon cancer Neg Hx    Esophageal cancer Neg Hx    Rectal cancer Neg Hx     Social History   Socioeconomic History   Marital status: Widowed    Spouse name: Not on file   Number of children: Not on file   Years of education: Not on file   Highest education level: Some college, no degree  Occupational History   Occupation: retired  Tobacco Use   Smoking status: Former    Current packs/day: 0.00    Average packs/day: 0.5 packs/day for 10.0 years (  5.0 ttl pk-yrs)    Types: Cigarettes    Start date: 09/10/1964    Quit date: 09/10/1974     Years since quitting: 49.8   Smokeless tobacco: Never   Tobacco comments:    smoked 1966- 1976, up to 1 ppd  Vaping Use   Vaping status: Never Used  Substance and Sexual Activity   Alcohol use: No    Alcohol/week: 0.0 standard drinks of alcohol   Drug use: No   Sexual activity: Not Currently  Other Topics Concern   Not on file  Social History Narrative   Daily caffeine 3 cups   Regular exercise   Married         Social Drivers of Health   Financial Resource Strain: Low Risk  (02/28/2024)   Overall Financial Resource Strain (CARDIA)    Difficulty of Paying Living Expenses: Not very hard  Food Insecurity: No Food Insecurity (02/27/2024)   Hunger Vital Sign    Worried About Running Out of Food in the Last Year: Never true    Ran Out of Food in the Last Year: Never true  Transportation Needs: No Transportation Needs (02/27/2024)   PRAPARE - Administrator, Civil Service (Medical): No    Lack of Transportation (Non-Medical): No  Physical Activity: Insufficiently Active (02/27/2024)   Exercise Vital Sign    Days of Exercise per Week: 3 days    Minutes of Exercise per Session: 30 min  Stress: No Stress Concern Present (02/27/2024)   [employer] of Occupational Health - Occupational Stress Questionnaire    Feeling of Stress: Not at all  Social Connections: Moderately Integrated (02/27/2024)   Social Connection and Isolation Panel    Frequency of Communication with Friends and Family: Three times a week    Frequency of Social Gatherings with Friends and Family: Three times a week    Attends Religious Services: More than 4 times per year    Active Member of Clubs or Organizations: Yes    Attends Banker Meetings: More than 4 times per year    Marital Status: Widowed  Intimate Partner Violence: Not At Risk (02/28/2024)   Humiliation, Afraid, Rape, and Kick questionnaire    Fear of Current or Ex-Partner: No    Emotionally Abused: No    Physically  Abused: No    Sexually Abused: No    Review of Systems:  All other review of systems negative except as mentioned in the HPI.  Physical Exam: Vital signs BP (!) 165/69   Pulse (!) 54   Temp 97.6 F (36.4 C)   Ht 5' (1.524 m)   Wt 146 lb (66.2 kg)   SpO2 100%   BMI 28.51 kg/m   General:   Alert,  Well-developed, well-nourished, pleasant and cooperative in NAD Lungs:  Clear throughout to auscultation.   Heart:  Regular rate and rhythm; no murmurs, clicks, rubs,  or gallops. Abdomen:  Soft, nontender and nondistended. Normal bowel sounds.   Neuro/Psych:  Alert and cooperative. Normal mood and affect. A and O x 3   @Richards Pherigo  CHARLENA Commander, MD, [facility] [phone] (pager) 07/07/2024 1:41 PM@

## 2024-07-06 NOTE — Telephone Encounter (Signed)
 Inbound call from patient stating she did want to make us  aware she made a mistake. And to please disregard the previous message to due to her confusing her dates. Please advise.

## 2024-07-06 NOTE — Telephone Encounter (Signed)
 Patient states she has having heart palpitations in August. Patient is requesting a call to discuss if it will okay to proceed with EGD scheduled for tomorrow. Please advise, thank you

## 2024-07-06 NOTE — Telephone Encounter (Signed)
 Attempted to reach patient. No answer, left VM for patient to return call.

## 2024-07-06 NOTE — Telephone Encounter (Signed)
 Anesthesia reviewed chart regarding previous message about palpitations and pt is okay to proceed with EGD on 07/07/24.

## 2024-07-07 ENCOUNTER — Encounter: Payer: Self-pay | Admitting: Internal Medicine

## 2024-07-07 ENCOUNTER — Ambulatory Visit: Admitting: Internal Medicine

## 2024-07-07 VITALS — BP 117/58 | HR 52 | Temp 97.6°F | Resp 11 | Ht 60.0 in | Wt 146.0 lb

## 2024-07-07 DIAGNOSIS — R1310 Dysphagia, unspecified: Secondary | ICD-10-CM

## 2024-07-07 DIAGNOSIS — R09A2 Foreign body sensation, throat: Secondary | ICD-10-CM

## 2024-07-07 DIAGNOSIS — K317 Polyp of stomach and duodenum: Secondary | ICD-10-CM

## 2024-07-07 DIAGNOSIS — R933 Abnormal findings on diagnostic imaging of other parts of digestive tract: Secondary | ICD-10-CM

## 2024-07-07 MED ORDER — SODIUM CHLORIDE 0.9 % IV SOLN: 500.0000 mL | INTRAVENOUS | Status: AC

## 2024-07-07 MED ORDER — ESOMEPRAZOLE MAGNESIUM 40 MG PO CPDR: 40.0000 mg | DELAYED_RELEASE_CAPSULE | Freq: Every day | ORAL | 3 refills | Status: AC

## 2024-07-07 NOTE — Progress Notes (Signed)
 Called to room to assist during endoscopic procedure.  Patient ID and intended procedure confirmed with present staff. Received instructions for my participation in the procedure from the performing physician.

## 2024-07-07 NOTE — Progress Notes (Signed)
 Pt's states no medical or surgical changes since previsit or office visit.

## 2024-07-07 NOTE — Op Note (Signed)
 [facility] Patient Name: Kayla [name] Procedure Date: 07/07/2024 2:23 PM MRN: [id number] Endoscopist: Lupita FORBES Commander , MD, [id number] Age: 78 Referring MD:  Date of Birth: [birth year] Gender: Female Account #: [id number] Procedure:                Upper GI endoscopy Indications:              Dysphagia, Heartburn, Abnormal cine-esophagram Medicines:                Monitored Anesthesia Care Procedure:                Pre-Anesthesia Assessment:                           - Prior to the procedure, a History and Physical                            was performed, and patient medications and                            allergies were reviewed. The patient's tolerance of                            previous anesthesia was also reviewed. The risks                            and benefits of the procedure and the sedation                            options and risks were discussed with the patient.                            All questions were answered, and informed consent                            was obtained. Prior Anticoagulants: The patient has                            taken no anticoagulant or antiplatelet agents. ASA                            Grade Assessment: II - A patient with mild systemic                            disease. After reviewing the risks and benefits,                            the patient was deemed in satisfactory condition to                            undergo the procedure.                           After obtaining informed consent, the endoscope was  passed under direct vision. Throughout the                            procedure, the patient's blood pressure, pulse, and                            oxygen saturations were monitored continuously. The                            GIF [id number] #[id number] was introduced through the                            mouth, and advanced to the second part of duodenum.                            The  upper GI endoscopy was accomplished without                            difficulty. The patient tolerated the procedure                            well. Scope In: Scope Out: Findings:                 No endoscopic abnormality was evident in the                            esophagus to explain the patient's complaint of                            dysphagia. It was decided, however, to proceed with                            dilation of the entire esophagus. The scope was                            withdrawn. Dilation was performed with a Maloney                            dilator with mild resistance at 54 Fr. The dilation                            site was examined following endoscope reinsertion                            and showed no change. Estimated blood loss: none.                           Multiple small sessile polyps were found in the                            gastric fundus and in the gastric body. Biopsies  were taken with a cold forceps for histology.                            Verification of patient identification for the                            specimen was done. Estimated blood loss was minimal.                           The exam was otherwise without abnormality.                           The cardia and gastric fundus were otherwise normal                            on retroflexion. Complications:            No immediate complications. Estimated Blood Loss:     Estimated blood loss was minimal. Impression:               - No endoscopic esophageal abnormality to explain                            patient's dysphagia. Esophagus dilated. Dilated w/                            54 Fr Maloney                           - Multiple gastric polyps. Biopsied. Look like                            inocent fundic gland polyps.                           - The examination was otherwise normal. Recommendation:           - Patient has a contact number  available for                            emergencies. The signs and symptoms of potential                            delayed complications were discussed with the                            patient. Return to normal activities tomorrow.                            Written discharge instructions were provided to the                            patient.                           - Clear liquids x 1 hour then soft foods rest of  day. Start prior diet tomorrow.                           - Continue present medications.                           - Await pathology results.                           - Change esomeprazole  (Nexium ) from 20 mg to 40 mg                            daily (Rx sent to [facility]) - 4 out of 7                            days symptom control on 20 mg dose Lupita FORBES Commander, MD 07/07/2024 3:05:43 PM This report has been signed electronically.

## 2024-07-07 NOTE — Progress Notes (Signed)
 Sedate, gd SR, tolerated procedure well, VSS, report to RN

## 2024-07-07 NOTE — Telephone Encounter (Signed)
 Communicated with pt via my chart messages. No further action needed on this encounter.

## 2024-07-07 NOTE — Patient Instructions (Addendum)
 You have some polyps in the stomach.  These look benign and innocent though I did take biopsies to check them and will let you know what they were.  I stretched or dilated the esophagus to try to help you swallow better.  I am changing your Nexium  to 40 mg, via prescription through your mail-order pharmacy which is the cheapest form for you.  Continue to take your 20 mg Nexium  until the prescription arrives, but take 2 a day.  I appreciate the opportunity to care for you. Lupita CHARLENA Commander, MD, FACG  YOU HAD AN ENDOSCOPIC PROCEDURE TODAY AT THE [facility]:   Refer to the procedure report that was given to you for any specific questions about what was found during the examination.  If the procedure report does not answer your questions, please call your gastroenterologist to clarify.  If you requested that your care partner not be given the details of your procedure findings, then the procedure report has been included in a sealed envelope for you to review at your convenience later.  YOU SHOULD EXPECT: Some feelings of bloating in the abdomen. Passage of more gas than usual.  Walking can help get rid of the air that was put into your GI tract during the procedure and reduce the bloating. If you had a lower endoscopy (such as a colonoscopy or flexible sigmoidoscopy) you may notice spotting of blood in your stool or on the toilet paper. If you underwent a bowel prep for your procedure, you may not have a normal bowel movement for a few days.  Please Note:  You might notice some irritation and congestion in your nose or some drainage.  This is from the oxygen used during your procedure.  There is no need for concern and it should clear up in a day or so.  SYMPTOMS TO REPORT IMMEDIATELY:   Following upper endoscopy (EGD)  Vomiting of blood or coffee ground material  New chest pain or pain under the shoulder blades  Painful or persistently difficult swallowing  New shortness of  breath  Fever of 100F or higher  Black, tarry-looking stools  For urgent or emergent issues, a gastroenterologist can be reached at any hour by calling [phone]. Do not use MyChart messaging for urgent concerns.    DIET:  Follow a POST DILATION DIET: Nothing to eat or drink until 4:00 pm, CLEAR LIQUIDS ONLY from 4:00 pm to 5:00 pm, then you may proceed to a SOFT DIET starting at 5:00 pm for the rest of the day today. We do recommend a small meal at first, but then you may proceed to your regular diet.  Drink plenty of fluids but you should avoid alcoholic beverages for 24 hours.  MEDICATIONS: Continue present medications. Change esomeprazole  (Nexium ) from 20 mg to 40 mg daily (RX sent to [facility] order) (4 out of 7 days symptom control on 20 mg dose).  FOLLOW UP: Await pathology results.  Educational handouts given to patient: Post Dilation Diet.  Thank you for allowing us  to provide for your healthcare needs today.   ACTIVITY:  You should plan to take it easy for the rest of today and you should NOT DRIVE or use heavy machinery until tomorrow (because of the sedation medicines used during the test).    FOLLOW UP: Our staff will call the number listed on your records the next business day following your procedure.  We will call around 7:15- 8:00 am to check on you and  address any questions or concerns that you may have regarding the information given to you following your procedure. If we do not reach you, we will leave a message.     If any biopsies were taken you will be contacted by phone or by letter within the next 1-3 weeks.  Please call us  at [phone] if you have not heard about the biopsies in 3 weeks.    SIGNATURES/CONFIDENTIALITY: You and/or your care partner have signed paperwork which will be entered into your electronic medical record.  These signatures attest to the fact that that the information above on your After Visit Summary has been reviewed and is  understood.  Full responsibility of the confidentiality of this discharge information lies with you and/or your care-partner.

## 2024-07-08 ENCOUNTER — Telehealth: Payer: Self-pay | Admitting: *Deleted

## 2024-07-08 NOTE — Telephone Encounter (Signed)
  Follow up Call-     07/07/2024    1:27 PM 07/07/2024    1:18 PM 03/20/2022    1:25 PM  Call back number  Post procedure Call Back phone  # [phone]  Permission to leave phone message Yes Yes Yes     Patient questions:  Do you have a fever, pain , or abdominal swelling? No. Pain Score  0 *  Have you tolerated food without any problems? Yes.    Have you been able to return to your normal activities? Yes.    Do you have any questions about your discharge instructions: Diet   No. Medications  No. Follow up visit  No.  Do you have questions or concerns about your Care? No.  Actions: * If pain score is 4 or above: No action needed, pain <4.

## 2024-07-10 LAB — SURGICAL PATHOLOGY

## 2024-07-17 ENCOUNTER — Encounter: Payer: Self-pay | Admitting: Internal Medicine

## 2024-07-17 ENCOUNTER — Ambulatory Visit: Payer: Self-pay | Admitting: Internal Medicine

## 2024-07-24 ENCOUNTER — Telehealth: Payer: Self-pay | Admitting: Pharmacist

## 2024-07-28 NOTE — Telephone Encounter (Signed)
 Spoke with patient. She is having trouble finding a magnesium  that she can take. She is taking a magnesium  complex 300mg  - has mixture of magnesiums. She wants to know if it would be OK.  Her main reason for taking magnesium  is for bone health / better calcium  absorption.  Discussed benefits of magnesium . Reviewed ingredients below. Ok to take the magnesium  complex. Recommended 2 capsules daily which = 300mg  / day.  Plan to check serum magnesium  in 2 to 3 months.

## 2024-08-04 ENCOUNTER — Other Ambulatory Visit: Payer: Self-pay | Admitting: Family Medicine

## 2024-08-04 DIAGNOSIS — I10 Essential (primary) hypertension: Secondary | ICD-10-CM

## 2024-08-05 ENCOUNTER — Other Ambulatory Visit: Payer: Self-pay

## 2024-08-05 DIAGNOSIS — J4520 Mild intermittent asthma, uncomplicated: Secondary | ICD-10-CM

## 2024-08-05 MED ORDER — ALBUTEROL SULFATE HFA 108 (90 BASE) MCG/ACT IN AERS: 2.0000 | INHALATION_SPRAY | Freq: Four times a day (QID) | RESPIRATORY_TRACT | 5 refills | Status: AC | PRN

## 2024-08-11 LAB — HM MAMMOGRAPHY

## 2024-08-21 ENCOUNTER — Encounter: Payer: Self-pay | Admitting: Obstetrics and Gynecology

## 2024-08-21 ENCOUNTER — Ambulatory Visit: Admitting: Obstetrics and Gynecology

## 2024-08-21 VITALS — BP 158/60 | HR 51 | Ht 60.63 in | Wt 144.8 lb

## 2024-08-21 DIAGNOSIS — N952 Postmenopausal atrophic vaginitis: Secondary | ICD-10-CM

## 2024-08-21 DIAGNOSIS — N8110 Cystocele, unspecified: Secondary | ICD-10-CM | POA: Diagnosis not present

## 2024-08-21 DIAGNOSIS — M62838 Other muscle spasm: Secondary | ICD-10-CM | POA: Diagnosis not present

## 2024-08-21 DIAGNOSIS — R350 Frequency of micturition: Secondary | ICD-10-CM

## 2024-08-21 LAB — POCT URINALYSIS DIP (CLINITEK)
Bilirubin, UA: NEGATIVE
Blood, UA: NEGATIVE
Glucose, UA: NEGATIVE mg/dL
Ketones, POC UA: NEGATIVE mg/dL
Nitrite, UA: NEGATIVE
POC PROTEIN,UA: NEGATIVE
Spec Grav, UA: 1.02 (ref 1.010–1.025)
Urobilinogen, UA: 0.2 U/dL
pH, UA: 8 (ref 5.0–8.0)

## 2024-08-21 MED ORDER — ESTRADIOL 0.01 % VA CREA: 0.5000 g | TOPICAL_CREAM | VAGINAL | 11 refills | Status: AC

## 2024-08-21 NOTE — Assessment & Plan Note (Signed)
-   start vaginal estrogen cream - Also recommended vaginal moisturizer daily like coconut oil

## 2024-08-21 NOTE — Patient Instructions (Addendum)
 Vulvovaginal moisturizer Options: Vitamin E oil (pump or capsule) or cream (Gene's Vit E Cream) Coconut oil Silicone-based lubricant for use during intercourse (wet platinum is a brand available at most drugstores) Crisco Consider the ingredients of the product - the fewer the ingredients the better!  Directions for Use: Clean and dry your hands Gently dab the vulvar/vaginal area dry as needed Apply a pea-sized amount of the moisturizer onto your fingertip Using you other hand, open the labia  Apply the moisturizer to the vulvar/vaginal tissues Wear loose fitting underwear/clothing if possible following application Use moisturize up to 3 times daily as desired.   For vaginal atrophy (thinning of the vaginal tissue that can cause dryness and burning) and UTI prevention we discussed estrogen replacement in the form of vaginal cream.   Start vaginal estrogen therapy 2 times weekly at night. This can be placed with your finger or an applicator inside the vagina and around the urethra.  Please let us  know if the prescription is too expensive and we can look for alternative options.   Is vaginal estrogen therapy safe for me? Vaginal estrogen preparations act on the vaginal skin, and only a very tiny amount is absorbed into the bloodstream (0.01%).  They work in a similar way to hand or face cream.  There is minimal absorption and they are therefore perfectly safe. If you have had breast cancer and have persistent troublesome symptoms which aren't settling with vaginal moisturisers and lubricants, local estrogen treatment may be a possibility, but consultation with your oncologist should take place first.

## 2024-08-21 NOTE — Assessment & Plan Note (Signed)
 Stage II anterior, Stage 0 posterior, Stage 0 apical prolapse - For treatment of pelvic organ prolapse, we discussed options for management including expectant management, conservative management, and surgical management, such as Kegels, a pessary, pelvic floor physical therapy, and specific surgical procedures. - I suspect that she is more bothered by her pelvic muscle spasm and atrophy than the actual prolapse. Can reassess after start PT and vaginal moisturizer.

## 2024-08-21 NOTE — Progress Notes (Signed)
 New Patient Evaluation and Consultation  Referring Provider: Antonio Meth, Jamee SAUNDERS, DO PCP: Antonio Meth Jamee SAUNDERS, DO Date of Service: 08/21/2024  SUBJECTIVE Chief Complaint: New Patient (Initial Visit) Kayla [name] is a 78 y.o. female here today for female organ prolapse.)  History of Present Illness: Kayla [name] is a 78 y.o. White or Caucasian female seen in consultation at the request of Dr Antonio Meth for evaluation of prolapse.     Urinary Symptoms: Leaks urine with cough/ sneeze, laughing, lifting, with movement to the bathroom, and with urgency Does not leak often, usually only if she wits too long she will leak on the way to the bathroom.  Pad use: 1 pads per day/ night Patient is not bothered by UI symptoms.  Day time voids- every few hours.  Nocturia: 1 times per night to void. Voiding dysfunction:  does not empty bladder well.  Patient does not use a catheter to empty bladder.  When urinating, patient feels dribbling after finishing Drinks: 2 cups coffee in AM (half decaf), 8 glasses water per day. Occasional green tea.   UTIs: 0 UTI's in the last year.   Denies history of blood in urine and kidney or bladder stones   Pelvic Organ Prolapse Symptoms:                  Patient Admits to a feeling of a bulge the vaginal area. It has been present for several years.  Patient Denies seeing a bulge.  This bulge is bothersome. She is able to push it up but has some aching.   Has been having some lower abdominal discomfort and not sure if this is related.    Bowel Symptom: Bowel movements: 1 time(s) per day Stool consistency: soft  Straining: no.  Splinting: no.  Incomplete evacuation: no.  Patient Denies accidental bowel leakage / fecal incontinence Bowel regimen: diet and fiber  HM Colonoscopy          Completed or No Longer Recommended     Colonoscopy  Discontinued      Frequency changed to Never automatically (Topic No Longer Applies)   03/20/2022   COLONOSCOPY  Only the first 1 history entries have been loaded, but more history exists.               Sexual Function Sexually active: no.   Pelvic Pain Denies pelvic pain   Past Medical History:  Past Medical History:  Diagnosis Date   Abscess of left groin 11/10/2014   Allergy     Arthritis    Asthma    Cataract 2015   Very mild   Cellulitis 10/20/2014   Diverticulitis    Diverticulosis    Fasting hyperglycemia 04/2011   FBS 108   Fibromyalgia    Fundic gland polyps of stomach, benign    GERD (gastroesophageal reflux disease)    gastric polyp x3   Glaucoma     Dr Camillo   Hypertension    IBS (irritable bowel syndrome)    Osteoporosis    last 07/2011   Pneumonia     OP as child   Pseudogout    Shingles      Past Surgical History:   Past Surgical History:  Procedure Laterality Date   COLONOSCOPY W/ POLYPECTOMY  1993,2002,2007   last  colonoscopy 2007, Dr Avram   DILATION AND CURETTAGE OF UTERUS     ESOPHAGOGASTRODUODENOSCOPY     SPINE SURGERY     T9-L5 fusions  Past OB/GYN History: OB History  Gravida Para Term Preterm AB Living  2 1 1  1 1   SAB IAB Ectopic Multiple Live Births  1    1    # Outcome Date GA Lbr Len/2nd Weight Sex Type Anes PTL Lv  2 SAB           1 Term     M Vag-Spont   LIV    Menopausal: Denies vaginal bleeding since menopause Any history of abnormal pap smears: no.   Medications: Patient has a current medication list which includes the following prescription(s): acetaminophen, albuterol , albuterol , amlodipine , beclomethasone, bifidobacterium infantis, cholecalciferol, colchicine , epinephrine , esomeprazole , [START ON 08/24/2024] estradiol, famotidine , flaxseed (linseed), hyoscyamine , latanoprost, loratadine, metoprolol  tartrate, montelukast , nystatin  cream, polyethyl glycol-propyl glycol, sodium chloride , triamcinolone  acetonide, and triamcinolone  cream.   Allergies: Patient is allergic to cephalosporins,  gabapentin, levofloxacin, psyllium, sulfonamide derivatives, aspirin, belladonna, ciprofloxacin hcl, conjugated estrogens, cymbopogon, doxycycline , flovent  hfa [fluticasone ], nabumetone, nsaids, soybean-containing drug products, zicam cold remedy [homeopathic products], zocor  [simvastatin ], nickel, paba (para-aminobenzoate) derivatives, and prednisone .   Social History: Social History[1]  Relationship status: widowed Patient lives alone.   Patient is not employed. Regular exercise: Yes: walking, strength training History of abuse: No  Family History:   Family History  Problem Relation Age of Onset   Breast cancer Mother    Heart failure Father        CHF, CABG late 20s   Breast cancer Sister    Kidney disease Maternal Grandmother    Heart attack Paternal Grandmother        MI in late 55s   Heart disease Paternal Grandfather    Pulmonary embolism Son    Deep vein thrombosis Son    Heart Problems Son        Heart attacks related to a blood disorder    Breast cancer Maternal Aunt        two   Heart disease Maternal Aunt    Breast cancer Paternal Aunt    Asthma Paternal Aunt    Coronary artery disease Paternal Aunt        triple CABG   Stomach cancer Paternal Uncle    Heart attack Paternal Uncle         MI in 65s   Ovarian cancer Other        Niece x2   Breast cancer Other        niece   Diabetes Neg Hx    Stroke Neg Hx    COPD Neg Hx    Colon polyps Neg Hx    Colon cancer Neg Hx    Esophageal cancer Neg Hx    Rectal cancer Neg Hx    Bladder Cancer Neg Hx    Renal cancer Neg Hx    Uterine cancer Neg Hx      Review of Systems: Review of Systems  Constitutional:  Negative for fever, malaise/fatigue and weight loss.  Respiratory:  Negative for cough, shortness of breath and wheezing.   Cardiovascular:  Positive for leg swelling. Negative for chest pain and palpitations.  Gastrointestinal:  Negative for abdominal pain and blood in stool.  Genitourinary:  Negative  for dysuria.  Musculoskeletal:  Negative for myalgias.  Skin:  Negative for rash.  Neurological:  Negative for dizziness and headaches.  Endo/Heme/Allergies:  Bruises/bleeds easily.  Psychiatric/Behavioral:  Negative for depression. The patient is not nervous/anxious.      OBJECTIVE Physical Exam: Vitals:   08/21/24 1103  BP: (!) 158/60  Pulse: ROLLEN)  51  Weight: 144 lb 12.8 oz (65.7 kg)  Height: 5' 0.63 (1.54 m)    Physical Exam Vitals reviewed. Exam conducted with a chaperone present.  Constitutional:      General: She is not in acute distress. Pulmonary:     Effort: Pulmonary effort is normal.  Abdominal:     General: There is no distension.     Palpations: Abdomen is soft.     Tenderness: There is no abdominal tenderness. There is no rebound.  Musculoskeletal:        General: No swelling. Normal range of motion.  Skin:    General: Skin is warm and dry.     Findings: No rash.  Neurological:     Mental Status: She is alert and oriented to person, place, and time.  Psychiatric:        Mood and Affect: Mood normal.        Behavior: Behavior normal.      GU / Detailed Urogynecologic Evaluation:  Pelvic Exam: Normal external female genitalia; Bartholin's and Skene's glands normal in appearance; urethral meatus normal in appearance, no urethral masses or discharge.   CST: negative  Speculum exam reveals normal vaginal mucosa with atrophy. Cervix normal appearance. Uterus normal single, nontender. Adnexa no mass, fullness, tenderness.    Pelvic floor strength II/V  Pelvic floor musculature: Right levator tender, Right obturator tender, Left levator tender, Left obturator tender  POP-Q:   POP-Q  0                                            Aa   0                                           Ba  -6.5                                              C   3                                            Gh  4.5                                            Pb  6.5                                             tvl   -3                                            Ap  -3  Bp  -6.5                                              D      Rectal Exam:  deferred  Post-Void Residual (PVR) by Bladder Scan: In order to evaluate bladder emptying, we discussed obtaining a postvoid residual and patient agreed to this procedure.  Procedure: The ultrasound unit was placed on the patient's abdomen in the suprapubic region after the patient had voided.    Post Void Residual - 08/21/24 1132       Post Void Residual   Post Void Residual 20 mL           Laboratory Results: Lab Results  Component Value Date   COLORU yellow 08/21/2024   CLARITYU clear 08/21/2024   GLUCOSEUR negative 08/21/2024   BILIRUBINUR negative 08/21/2024   KETONESU negative 06/16/2024   SPECGRAV 1.020 08/21/2024   RBCUR negative 08/21/2024   PHUR 8.0 08/21/2024   PROTEINUR Negative 06/16/2024   UROBILINOGEN 0.2 08/21/2024   LEUKOCYTESUR Small (1+) (A) 08/21/2024    Lab Results  Component Value Date   CREATININE 0.69 06/11/2024   CREATININE 1.03 (H) 04/29/2024   CREATININE 0.65 12/31/2023    Lab Results  Component Value Date   HGBA1C 5.4 11/01/2011    Lab Results  Component Value Date   HGB 13.9 06/11/2024     ASSESSMENT AND PLAN Ms. Mcclellan is a 78 y.o. with:  1. Levator spasm   2. Vaginal atrophy   3. Prolapse of anterior vaginal wall   4. Urinary frequency     Levator spasm Assessment & Plan: - The origin of pelvic floor muscle spasm can be multifactorial, including primary, reactive to a different pain source, trauma, or even part of a centralized pain syndrome.Treatment options include pelvic floor physical therapy, local (vaginal) or oral  muscle relaxants, pelvic muscle trigger point injections or centrally acting pain medications.   - She will start with pelvic PT, referral placed.   Orders: -     AMB referral to  rehabilitation  Vaginal atrophy Assessment & Plan: - start vaginal estrogen cream - Also recommended vaginal moisturizer daily like coconut oil  Orders: -     Estradiol; Place 0.5 g vaginally 2 (two) times a week. Place 0.5g (pea sized amount) vaginally at night twice a week  Dispense: 42.5 g; Refill: 11  Prolapse of anterior vaginal wall Assessment & Plan: Stage II anterior, Stage 0 posterior, Stage 0 apical prolapse - For treatment of pelvic organ prolapse, we discussed options for management including expectant management, conservative management, and surgical management, such as Kegels, a pessary, pelvic floor physical therapy, and specific surgical procedures. - I suspect that she is more bothered by her pelvic muscle spasm and atrophy than the actual prolapse. Can reassess after start PT and vaginal moisturizer.    Urinary frequency -     POCT URINALYSIS DIP (CLINITEK)  Return 4 months or sooner if needed   Rosaline LOISE Caper, MD        [1]  Social History Tobacco Use   Smoking status: Former    Current packs/day: 0.00    Average packs/day: 0.5 packs/day for 10.0 years (5.0 ttl pk-yrs)    Types: Cigarettes    Start date: 09/10/1964    Quit date: 09/10/1974    Years since quitting: 21.9  Smokeless tobacco: Never   Tobacco comments:    smoked 1966- 1976, up to 1 ppd  Vaping Use   Vaping status: Never Used  Substance Use Topics   Alcohol use: No    Alcohol/week: 0.0 standard drinks of alcohol   Drug use: No

## 2024-08-21 NOTE — Assessment & Plan Note (Signed)
-   The origin of pelvic floor muscle spasm can be multifactorial, including primary, reactive to a different pain source, trauma, or even part of a centralized pain syndrome.Treatment options include pelvic floor physical therapy, local (vaginal) or oral  muscle relaxants, pelvic muscle trigger point injections or centrally acting pain medications.   - She will start with pelvic PT, referral placed.

## 2024-09-01 ENCOUNTER — Other Ambulatory Visit: Payer: Self-pay | Admitting: Family Medicine

## 2024-09-08 ENCOUNTER — Encounter: Payer: Self-pay | Admitting: Family Medicine

## 2024-09-08 ENCOUNTER — Other Ambulatory Visit: Payer: Self-pay | Admitting: Family Medicine

## 2024-09-08 ENCOUNTER — Ambulatory Visit (INDEPENDENT_AMBULATORY_CARE_PROVIDER_SITE_OTHER): Admitting: Family Medicine

## 2024-09-08 ENCOUNTER — Ambulatory Visit: Payer: Self-pay | Admitting: Family Medicine

## 2024-09-08 ENCOUNTER — Telehealth: Payer: Self-pay

## 2024-09-08 ENCOUNTER — Ambulatory Visit (HOSPITAL_BASED_OUTPATIENT_CLINIC_OR_DEPARTMENT_OTHER)
Admission: RE | Admit: 2024-09-08 | Discharge: 2024-09-08 | Disposition: A | Discharge status: Home / Self Care | Source: Ambulatory Visit | Attending: Family Medicine | Admitting: Family Medicine

## 2024-09-08 VITALS — BP 142/82 | Temp 98.4°F | Resp 16 | Ht 60.3 in | Wt 147.6 lb

## 2024-09-08 DIAGNOSIS — R10A1 Flank pain, right side: Secondary | ICD-10-CM | POA: Diagnosis not present

## 2024-09-08 DIAGNOSIS — R1024 Suprapubic pain: Secondary | ICD-10-CM | POA: Diagnosis present

## 2024-09-08 DIAGNOSIS — N200 Calculus of kidney: Secondary | ICD-10-CM

## 2024-09-08 LAB — POC URINALSYSI DIPSTICK (AUTOMATED)
Bilirubin, UA: NEGATIVE
Blood, UA: NEGATIVE
Glucose, UA: NEGATIVE
Ketones, UA: NEGATIVE
Leukocytes, UA: NEGATIVE
Nitrite, UA: NEGATIVE
Protein, UA: NEGATIVE
Spec Grav, UA: <=1.005 — AB
Urobilinogen, UA: 0.2 U/dL
pH, UA: 7

## 2024-09-08 MED ORDER — TAMSULOSIN HCL 0.4 MG PO CAPS: 0.4000 mg | ORAL_CAPSULE | Freq: Every day | ORAL | 3 refills | Status: AC

## 2024-09-08 MED ORDER — CYCLOBENZAPRINE HCL 10 MG PO TABS: 10.0000 mg | ORAL_TABLET | Freq: Three times a day (TID) | ORAL | 0 refills | Status: AC | PRN

## 2024-09-08 MED ORDER — TRAMADOL HCL 50 MG PO TABS: 50.0000 mg | ORAL_TABLET | Freq: Three times a day (TID) | ORAL | 0 refills | Status: AC | PRN

## 2024-09-08 NOTE — Telephone Encounter (Signed)
 Initial Comment Pt is having back pain. Translation No Nurse Assessment Nurse: Drury, RN, Cherene Date/Time Titus Time): 09/08/2024 9:19:13 AM Confirm and document reason for call. If symptomatic, describe symptoms. ---Caller states that she is having back pain, she scheduled an appt. Back pain started over the last week. Has been having on and off. Caller has been volunteering and walking a lot. Caller states that the pain got worse last night, took tylenol, did not help, applied biofreeze, did not help. Iced her back, did not help. Pain is only one the right side. No problems urinating. Does the patient have any new or worsening symptoms? ---Yes Will a triage be completed? ---Yes Related visit to physician within the last 2 weeks? ---No Does the PT have any chronic conditions? (i.e. diabetes, asthma, this includes High risk factors for pregnancy, etc.) ---Yes List chronic conditions. ---scheduled to see PT for muscles issues hx of GI issues hx of metal implants in back Is this a behavioral health or substance abuse call? ---No Guidelines Guideline Title Affirmed Question Affirmed Notes Nurse Date/Time Titus Time) Flank Pain [1] Sudden onset of severe flank pain Deaton, RN, Cherene 09/08/2024 9:27:02 AM PLEASE NOTE: All timestamps contained within this report are represented as Eastern Standard Time. CONFIDENTIALTY NOTICE: This fax transmission is intended only for the addressee. It contains information that is legally privileged, confidential or otherwise protected from use or disclosure. If you are not the intended recipient, you are strictly prohibited from reviewing, disclosing, copying using or disseminating any of this information or taking any action in reliance on or regarding this information. If you have received this fax in error, please notify us  immediately by telephone so that we can arrange for its return to us . Phone: [phone], Toll-Free: [phone], Fax:  [phone] CECILIA_LISTER Nov 07, 1945 Page: 1 of2 CallId: [id number] Guidelines Guideline Title Affirmed Question Affirmed Notes Nurse Date/Time Titus Time) AND [2] age > 60 years Disp. Time Titus Time) Disposition Final User 09/08/2024 9:31:54 AM Go to ED Now Yes Deaton, RN, Cherene Final Disposition 09/08/2024 9:31:54 AM Go to ED Now Yes Deaton, RN, Cherene [place] Disagree/Comply Disagree Caller Understands Yes PreDisposition Did not know what to do Care Advice Given Per Guideline GO TO ED NOW: * You need to be seen in the Emergency Department. * Go to the ED at ___________ Hospital. * Leave now. Drive carefully. ANOTHER ADULT SHOULD DRIVE: * It is better and safer if another adult drives instead of you. CARE ADVICE given per Flank Pain (Adult) guideline. Comments User: Cherene Drury, RN Date/Time Titus Time): 09/08/2024 9:25:57 AM oxygen 99% Heart rate normal for caller. User: Cherene Drury, RN Date/Time Titus Time): 09/08/2024 9:27:11 AM HTN User: Cherene Drury, RN Date/Time Titus Time): 09/08/2024 9:28:32 AM Denies any recent falls. User: Cherene Drury, RN Date/Time Titus Time): 09/08/2024 9:41:25 AM Caller has appt with her MD at 1pm. Caller advised that she understands the recommendation but is planning on waiting to see her MD at 1pm. Called the backline to be sure that there were no earlier appt. No sooner appts. Caller has not taken tylenol this am. Going to take a warm shower and tylenol. Caller verbalized an understanding to go to the ED if worsening sx. Referrals REFERRED TO PCP OFFICE

## 2024-09-08 NOTE — Telephone Encounter (Signed)
 Appt later today.

## 2024-09-08 NOTE — Progress Notes (Signed)
 "  Subjective:    Patient ID: Kayla [name], female    DOB: [birth year], 78 y.o.   MRN: [id number]  Chief Complaint  Patient presents with   Flank Pain    HPI Patient is in today for flank pain on the R side.  Discussed the use of AI scribe software for clinical note transcription with the patient, who gave verbal consent to proceed.  History of Present Illness Kayla [name] is a 78 year old female who presents with right-sided abdominal pain and urinary symptoms.  She experiences discomfort in the bladder area, with pain localized to the right side. The pain is described as a deep, achy sensation that does not radiate. It began yesterday evening and was severe enough to prevent sleep until early morning. She attempted to alleviate the pain with Tylenol, Biofreeze, and ice, but these measures were ineffective.  She has a history of back problems and surgery, and typically, Tylenol and ice have been effective for her back pain, but not in this instance. She has not taken any hydrocodone prescribed for a previous dental procedure.  No blood in her urine but she notes increased frequency of urination over the past few days, with full voids rather than dribbling. She has not experienced nausea, although she felt unwell after drinking water earlier today. She has been urinating more frequently, especially after returning from church on Sunday, and has been checking her temperature, which has remained normal.  She has not had a history of kidney stones.    Past Medical History:  Diagnosis Date   Abscess of left groin 11/10/2014   Allergy     Arthritis    Asthma    Cataract 2015   Very mild   Cellulitis 10/20/2014   Diverticulitis    Diverticulosis    Fasting hyperglycemia 04/2011   FBS 108   Fibromyalgia    Fundic gland polyps of stomach, benign    GERD (gastroesophageal reflux disease)    gastric polyp x3   Glaucoma     Dr Camillo   Hypertension    IBS (irritable  bowel syndrome)    Osteoporosis    last 07/2011   Pneumonia     OP as child   Pseudogout    Shingles     Past Surgical History:  Procedure Laterality Date   COLONOSCOPY W/ POLYPECTOMY  1993,2002,2007   last  colonoscopy 2007, Dr Avram   DILATION AND CURETTAGE OF UTERUS     ESOPHAGOGASTRODUODENOSCOPY     SPINE SURGERY     T9-L5 fusions    Family History  Problem Relation Age of Onset   Breast cancer Mother    Heart failure Father        CHF, CABG late 53s   Breast cancer Sister    Kidney disease Maternal Grandmother    Heart attack Paternal Grandmother        MI in late 79s   Heart disease Paternal Grandfather    Pulmonary embolism Son    Deep vein thrombosis Son    Heart Problems Son        Heart attacks related to a blood disorder    Breast cancer Maternal Aunt        two   Heart disease Maternal Aunt    Breast cancer Paternal Aunt    Asthma Paternal Aunt    Coronary artery disease Paternal Aunt        triple CABG   Stomach cancer Paternal Uncle  Heart attack Paternal Uncle         MI in 33s   Ovarian cancer Other        Niece x2   Breast cancer Other        niece   Diabetes Neg Hx    Stroke Neg Hx    COPD Neg Hx    Colon polyps Neg Hx    Colon cancer Neg Hx    Esophageal cancer Neg Hx    Rectal cancer Neg Hx    Bladder Cancer Neg Hx    Renal cancer Neg Hx    Uterine cancer Neg Hx     Social History   Socioeconomic History   Marital status: Widowed    Spouse name: Not on file   Number of children: Not on file   Years of education: Not on file   Highest education level: Some college, no degree  Occupational History   Occupation: retired  Tobacco Use   Smoking status: Former    Current packs/day: 0.00    Average packs/day: 0.5 packs/day for 10.0 years (5.0 ttl pk-yrs)    Types: Cigarettes    Start date: 09/10/1964    Quit date: 09/10/1974    Years since quitting: 50.0   Smokeless tobacco: Never   Tobacco comments:    smoked 1966- 1976,  up to 1 ppd  Vaping Use   Vaping status: Never Used  Substance and Sexual Activity   Alcohol use: No    Alcohol/week: 0.0 standard drinks of alcohol   Drug use: No   Sexual activity: Not Currently  Other Topics Concern   Not on file  Social History Narrative   Daily caffeine 3 cups   Regular exercise   Married         Social Drivers of Health   Tobacco Use: Medium Risk (09/08/2024)   Patient History    Smoking Tobacco Use: Former    Smokeless Tobacco Use: Never    Passive Exposure: Not on Actuary Strain: Low Risk (02/28/2024)   Overall Financial Resource Strain (CARDIA)    Difficulty of Paying Living Expenses: Not very hard  Food Insecurity: No Food Insecurity (02/27/2024)   Epic    Worried About Programme Researcher, Broadcasting/film/video in the Last Year: Never true    Ran Out of Food in the Last Year: Never true  Transportation Needs: No Transportation Needs (02/27/2024)   Epic    Lack of Transportation (Medical): No    Lack of Transportation (Non-Medical): No  Physical Activity: Insufficiently Active (02/27/2024)   Exercise Vital Sign    Days of Exercise per Week: 3 days    Minutes of Exercise per Session: 30 min  Stress: No Stress Concern Present (02/27/2024)   [employer] of Occupational Health - Occupational Stress Questionnaire    Feeling of Stress: Not at all  Social Connections: Moderately Integrated (02/27/2024)   Social Connection and Isolation Panel    Frequency of Communication with Friends and Family: Three times a week    Frequency of Social Gatherings with Friends and Family: Three times a week    Attends Religious Services: More than 4 times per year    Active Member of Clubs or Organizations: Yes    Attends Banker Meetings: More than 4 times per year    Marital Status: Widowed  Intimate Partner Violence: Not At Risk (02/28/2024)   Epic    Fear of Current or Ex-Partner: No    Emotionally Abused:  No    Physically Abused: No    Sexually  Abused: No  Depression (PHQ2-9): Low Risk (06/11/2024)   Depression (PHQ2-9)    PHQ-2 Score: 0  Alcohol Screen: Low Risk (02/28/2024)   Alcohol Screen    Last Alcohol Screening Score (AUDIT): 0  Housing: Low Risk (02/27/2024)   Epic    Unable to Pay for Housing in the Last Year: No    Number of Times Moved in the Last Year: 0    Homeless in the Last Year: No  Utilities: Not At Risk (02/28/2024)   Epic    Threatened with loss of utilities: No  Health Literacy: Adequate Health Literacy (02/28/2024)   B1300 Health Literacy    Frequency of need for help with medical instructions: Never    Outpatient Medications Prior to Visit  Medication Sig Dispense Refill   acetaminophen (TYLENOL) 500 MG tablet Take 500 mg by mouth as needed.     albuterol  (PROVENTIL ) (2.5 MG/3ML) 0.083% nebulizer solution Take 3 mLs (2.5 mg total) by nebulization every 6 (six) hours as needed for wheezing or shortness of breath. 150 mL 1   albuterol  (VENTOLIN  HFA) 108 (90 Base) MCG/ACT inhaler Inhale 2 puffs into the lungs every 6 (six) hours as needed for wheezing or shortness of breath. 18 g 5   amLODipine  (NORVASC ) 2.5 MG tablet Take 1 tablet (2.5 mg total) by mouth daily. 90 tablet 1   bifidobacterium infantis (ALIGN) capsule Take 1 capsule by mouth daily. 21 capsule 0   cholecalciferol (VITAMIN D ) 1000 units tablet Take 3,000 Units by mouth daily.     colchicine  0.6 MG tablet Take 0.6 mg by mouth daily as needed (gout).     EPINEPHrine  (EPIPEN  2-PAK) 0.3 mg/0.3 mL IJ SOAJ injection As directed 1 each 0   esomeprazole  (NEXIUM ) 40 MG capsule Take 1 capsule (40 mg total) by mouth daily before breakfast. 100 capsule 3   estradiol  (ESTRACE ) 0.01 % CREA vaginal cream Place 0.5 g vaginally 2 (two) times a week. Place 0.5g (pea sized amount) vaginally at night twice a week 42.5 g 11   famotidine  (PEPCID ) 20 MG tablet Take 1 tablet (20 mg total) by mouth 2 (two) times daily. 60 tablet 5   Flaxseed, Linseed, (GROUND FLAX SEEDS  PO)      hyoscyamine  (LEVSIN  SL) 0.125 MG SL tablet DISSOLVE 1 TABLET(0.125 MG) UNDER THE TONGUE EVERY 6 HOURS AS NEEDED 10 tablet 0   latanoprost (XALATAN) 0.005 % ophthalmic solution Place 1 drop into both eyes at bedtime.     loratadine (CLARITIN REDITABS) 10 MG dissolvable tablet Take 10 mg by mouth daily as needed for allergies.     metoprolol  tartrate (LOPRESSOR ) 25 MG tablet 1 po bid 180 tablet 3   montelukast  (SINGULAIR ) 10 MG tablet Take 1 tablet (10 mg total) by mouth at bedtime. 90 tablet 1   nystatin  cream (MYCOSTATIN ) APPLY TOPICALLY TO THE AFFECTED AREA TWICE DAILY 30 g 2   Polyethyl Glycol-Propyl Glycol (SYSTANE) 0.4-0.3 % GEL ophthalmic gel Place 1 application into both eyes.     QVAR  REDIHALER 40 MCG/ACT inhaler Inhale 2 puffs into the lungs 2 (two) times daily. 10.6 g 5   sodium chloride  (OCEAN) 0.65 % SOLN nasal spray Place 1 spray into both nostrils as needed for congestion.     Triamcinolone  Acetonide (NASACORT  AQ NA) Place 2 sprays into the nose 2 (two) times daily as needed (allergies).     triamcinolone  cream (KENALOG ) 0.1 % Apply 1  Application topically 2 (two) times daily as needed (rash).     No facility-administered medications prior to visit.    Allergies[1]  Review of Systems  Constitutional:  Negative for chills, fever and malaise/fatigue.  HENT:  Negative for congestion and hearing loss.   Eyes:  Negative for blurred vision and discharge.  Respiratory:  Negative for cough, sputum production and shortness of breath.   Cardiovascular:  Negative for chest pain, palpitations and leg swelling.  Gastrointestinal:  Positive for abdominal pain. Negative for blood in stool, constipation, diarrhea, heartburn, nausea and vomiting.  Genitourinary:  Positive for flank pain and frequency. Negative for dysuria, hematuria and urgency.  Musculoskeletal:  Negative for back pain, falls and myalgias.  Skin:  Negative for rash.  Neurological:  Negative for dizziness, sensory  change, loss of consciousness, weakness and headaches.  Endo/Heme/Allergies:  Negative for environmental allergies. Does not bruise/bleed easily.  Psychiatric/Behavioral:  Negative for depression and suicidal ideas. The patient is not nervous/anxious and does not have insomnia.        Objective:    Physical Exam Vitals and nursing note reviewed.  Constitutional:      General: She is not in acute distress.    Appearance: Normal appearance. She is well-developed.  HENT:     Head: Normocephalic and atraumatic.  Eyes:     General: No scleral icterus.       Right eye: No discharge.        Left eye: No discharge.  Cardiovascular:     Rate and Rhythm: Normal rate and regular rhythm.     Heart sounds: No murmur heard. Pulmonary:     Effort: Pulmonary effort is normal. No respiratory distress.     Breath sounds: Normal breath sounds.  Musculoskeletal:        General: Normal range of motion.     Cervical back: Normal range of motion and neck supple.     Lumbar back: Spasms present.       Back:     Right lower leg: No edema.     Left lower leg: No edema.     Comments: R flank pain with palpation  + spasm like pains that come and go but keep her from sleeping at night  Skin:    General: Skin is warm and dry.  Neurological:     Mental Status: She is alert and oriented to person, place, and time.  Psychiatric:        Mood and Affect: Mood normal.        Behavior: Behavior normal.        Thought Content: Thought content normal.        Judgment: Judgment normal.     BP (!) 142/82 (BP Location: Left Arm, Patient Position: Sitting, Cuff Size: Large)   Temp 98.4 F (36.9 C) (Oral)   Resp 16   Ht 5' 0.3 (1.532 m)   Wt 147 lb 9.6 oz (67 kg)   BMI 28.54 kg/m  Wt Readings from Last 3 Encounters:  09/08/24 147 lb 9.6 oz (67 kg)  08/21/24 144 lb 12.8 oz (65.7 kg)  07/07/24 146 lb (66.2 kg)    Diabetic Foot Exam - Simple   No data filed    Lab Results  Component Value Date    WBC 4.7 06/11/2024   HGB 13.9 06/11/2024   HCT 40.9 06/11/2024   PLT 169.0 06/11/2024   GLUCOSE 99 06/11/2024   CHOL 216 (H) 06/11/2024   TRIG 108.0 06/11/2024  HDL 76.20 06/11/2024   LDLDIRECT 153.3 12/18/2012   LDLCALC 118 (H) 06/11/2024   ALT 16 06/11/2024   AST 23 06/11/2024   NA 136 06/11/2024   K 4.4 06/11/2024   CL 100 06/11/2024   CREATININE 0.69 06/11/2024   BUN 16 06/11/2024   CO2 28 06/11/2024   TSH 1.80 06/11/2024   INR 1.2 (H) 01/08/2023   HGBA1C 5.4 11/01/2011    Lab Results  Component Value Date   TSH 1.80 06/11/2024   Lab Results  Component Value Date   WBC 4.7 06/11/2024   HGB 13.9 06/11/2024   HCT 40.9 06/11/2024   MCV 92.2 06/11/2024   PLT 169.0 06/11/2024   Lab Results  Component Value Date   NA 136 06/11/2024   K 4.4 06/11/2024   CO2 28 06/11/2024   GLUCOSE 99 06/11/2024   BUN 16 06/11/2024   CREATININE 0.69 06/11/2024   BILITOT 0.4 06/11/2024   ALKPHOS 96 06/11/2024   AST 23 06/11/2024   ALT 16 06/11/2024   PROT 7.3 06/11/2024   ALBUMIN 4.6 06/11/2024   CALCIUM  9.9 06/11/2024   ANIONGAP 13 04/29/2024   GFR 82.92 06/11/2024   Lab Results  Component Value Date   CHOL 216 (H) 06/11/2024   Lab Results  Component Value Date   HDL 76.20 06/11/2024   Lab Results  Component Value Date   LDLCALC 118 (H) 06/11/2024   Lab Results  Component Value Date   TRIG 108.0 06/11/2024   Lab Results  Component Value Date   CHOLHDL 3 06/11/2024   Lab Results  Component Value Date   HGBA1C 5.4 11/01/2011   Ua normal     Assessment & Plan:  Right flank pain -     CBC with Differential/Platelet -     Comprehensive metabolic panel with GFR -     POCT Urinalysis Dipstick (Automated) -     Cyclobenzaprine HCl; Take 1 tablet (10 mg total) by mouth 3 (three) times daily as needed for muscle spasms.  Dispense: 30 tablet; Refill: 0  Suprapubic pain -     CT RENAL STONE STUDY; Future   Assessment and Plan Assessment & Plan Right flank  and suprapubic pain, rule out nephrolithiasis   She experiences intermittent right flank and suprapubic pain, described as deep and achy, persistent, and unresponsive to acetaminophen, Biofreeze, or ice. Increased urinary frequency is noted without hematuria. Differential diagnosis includes nephrolithiasis and muscle spasm. A normal urinalysis does not rule out nephrolithiasis. A CT scan is ordered to confirm the diagnosis. Blood work is ordered to assess kidney function. A muscle relaxer is prescribed to aid in muscle relaxation and improve sleep. She is advised to use a strainer to catch any passed stones. If nephrolithiasis is confirmed, medication may be prescribed to facilitate stone passage. Referral to a urologist will be necessary if the stone is large.   Kayla [name] Shanks Chase, DO     [1]  Allergies Allergen Reactions   Cephalosporins     Blisters  orally   Gabapentin     REACTION: rash   Levofloxacin     REACTION: stomach ache and rash   Psyllium     REACTION: rash   Sulfonamide Derivatives Anaphylaxis    REACTION: shock, urticaria   Aspirin     REACTION: stomach pain   Belladonna    Ciprofloxacin Hcl    Conjugated Estrogens     History of thrombophlebitis   Cymbopogon    Doxycycline   Abdominal pain   Flovent  Hfa [Fluticasone ]    Nabumetone    Nsaids    Soybean-Containing Drug Products Other (See Comments)    GI upset   Zicam Cold Remedy [Homeopathic Products]    Zocor  [Simvastatin ] Itching   Nickel Itching and Rash   Paba (Para-Aminobenzoate) Derivatives Itching and Rash   Prednisone  Rash    NO PROBLEM WITH MEDROL  DOSE PAK Oral prednisone  caused facial burning, made my face feel like it was on fire   "

## 2024-09-09 LAB — CBC WITH DIFFERENTIAL/PLATELET
Basophils Absolute: 0.1 K/uL (ref 0.0–0.1)
Basophils Relative: 1.2 % (ref 0.0–3.0)
Eosinophils Absolute: 0 K/uL (ref 0.0–0.7)
Eosinophils Relative: 0.9 % (ref 0.0–5.0)
HCT: 38.5 % (ref 36.0–46.0)
Hemoglobin: 13.1 g/dL (ref 12.0–15.0)
Lymphocytes Relative: 14.1 % (ref 12.0–46.0)
Lymphs Abs: 0.7 K/uL (ref 0.7–4.0)
MCHC: 34.1 g/dL (ref 30.0–36.0)
MCV: 91.9 fl (ref 78.0–100.0)
Monocytes Absolute: 0.5 K/uL (ref 0.1–1.0)
Monocytes Relative: 9.2 % (ref 3.0–12.0)
Neutro Abs: 4 K/uL (ref 1.4–7.7)
Neutrophils Relative %: 74.6 % (ref 43.0–77.0)
Platelets: 164 K/uL (ref 150.0–400.0)
RBC: 4.19 Mil/uL (ref 3.87–5.11)
RDW: 13.4 % (ref 11.5–15.5)
WBC: 5.3 K/uL (ref 4.0–10.5)

## 2024-09-09 LAB — COMPREHENSIVE METABOLIC PANEL WITH GFR
ALT: 20 U/L (ref 3–35)
AST: 25 U/L (ref 5–37)
Albumin: 4.5 g/dL (ref 3.5–5.2)
Alkaline Phosphatase: 88 U/L (ref 39–117)
BUN: 14 mg/dL (ref 6–23)
CO2: 28 meq/L (ref 19–32)
Calcium: 9.4 mg/dL (ref 8.4–10.5)
Chloride: 96 meq/L (ref 96–112)
Creatinine, Ser: 0.61 mg/dL (ref 0.40–1.20)
GFR: 85.28 mL/min
Glucose, Bld: 84 mg/dL (ref 70–99)
Potassium: 4.3 meq/L (ref 3.5–5.1)
Sodium: 133 meq/L — ABNORMAL LOW (ref 135–145)
Total Bilirubin: 0.5 mg/dL (ref 0.2–1.2)
Total Protein: 6.8 g/dL (ref 6.0–8.3)

## 2024-09-11 ENCOUNTER — Ambulatory Visit: Admitting: Obstetrics

## 2024-09-11 NOTE — Telephone Encounter (Unsigned)
 Copied from CRM [id number]. Topic: Clinical - Medical Advice >> Sep 11, 2024 11:02 AM Drema MATSU wrote: Reason for CRM: Patient is needing to speak to a nurse regarding filtering her urine. She wants to know what she should do with it. If clinic wants her to bring it please call mid day so it can give her time to come to drop it off.

## 2024-09-16 ENCOUNTER — Telehealth: Payer: Self-pay

## 2024-09-16 DIAGNOSIS — Z79899 Other long term (current) drug therapy: Secondary | ICD-10-CM

## 2024-09-16 NOTE — Telephone Encounter (Signed)
 Copied from CRM [id number]. Topic: Clinical - Medication Question >> Sep 16, 2024  8:54 AM Revonda D wrote: Reason for CRM: Pt is requesting to speak with nurse Tammy in regards to the QVAR  REDIHALER 40 MCG/ACT inhaler. Pt stated that she mentioned this to [facility] and was advised to reach out to nurse Tammy in regards to this concern. Pt would like a callback in regards to this concern and would like a VM if she doesn't answer.

## 2024-09-24 ENCOUNTER — Other Ambulatory Visit: Payer: Self-pay | Admitting: Family Medicine

## 2024-09-24 DIAGNOSIS — T7840XD Allergy, unspecified, subsequent encounter: Secondary | ICD-10-CM

## 2024-09-29 ENCOUNTER — Other Ambulatory Visit: Admitting: Pharmacist

## 2024-09-29 NOTE — Telephone Encounter (Signed)
 Patient contacted. Please see phone visit notes from 09/29/2024

## 2024-09-29 NOTE — Progress Notes (Signed)
 "  09/29/2024 Name: Kayla [name] MRN: [id number] DOB: [birth year]  Chief Complaint  Patient presents with   Medication Management    QVar  cost    Kayla [name] is a 79 y.o. year old female who presented for a telephone visit.   They were referred to the pharmacist by their PCP for assistance in managing medication access.    Subjective:  Care Team: Primary Care Provider: Antonio Meth, Jamee SAUNDERS, DO ; Next Scheduled Visit: not currently scheduled UroGyn: Dr Janina; Next Scheduled Visit: 12/21/2024  Medication Access/Adherence  Current Pharmacy:  [id number], [place] - [address] AT [facility] [address] [place] [address] Phone: [phone] Fax: [phone]   Patient reports affordability concerns with their medications: Yes  Patient reports access/transportation concerns to their pharmacy: No  Patient reports adherence concerns with their medications:  No     Medication Management:  Patient reports that cost of QVAR  is $250.  For 2026 QVAR  is not on her [facility] formulary.  Flovent  is not formulary but patient has tried Flovent  in the past and had to stop due to side effect - worsened shortness of breath.    Objective:  Lab Results  Component Value Date   HGBA1C 5.4 11/01/2011    Lab Results  Component Value Date   CREATININE 0.61 09/08/2024   BUN 14 09/08/2024   NA 133 (L) 09/08/2024   K 4.3 09/08/2024   CL 96 09/08/2024   CO2 28 09/08/2024    Lab Results  Component Value Date   CHOL 216 (H) 06/11/2024   HDL 76.20 06/11/2024   LDLCALC 118 (H) 06/11/2024   LDLDIRECT 153.3 12/18/2012   TRIG 108.0 06/11/2024   CHOLHDL 3 06/11/2024    Medications Reviewed Today     Reviewed by Carla Milling, RPH-CPP (Pharmacist) on 09/29/24 at 1435  Med List Status: <None>   Medication Order Taking? Sig Documenting Provider Last Dose Status Informant  acetaminophen (TYLENOL) 500 MG tablet [id number] Yes Take 500 mg by mouth  as needed. [provider]  Active   albuterol  (PROVENTIL ) (2.5 MG/3ML) 0.083% nebulizer solution [id number] Yes Take 3 mLs (2.5 mg total) by nebulization every 6 (six) hours as needed for wheezing or shortness of breath. Cyndi Shaver, PA-C  Active   albuterol  (VENTOLIN  HFA) 108 (90 Base) MCG/ACT inhaler [id number] Yes Inhale 2 puffs into the lungs every 6 (six) hours as needed for wheezing or shortness of breath. Lowne Chase, Yvonne R, DO  Active   amLODipine  (NORVASC ) 2.5 MG tablet [id number] Yes Take 1 tablet (2.5 mg total) by mouth daily. Antonio Meth, Yvonne R, DO  Active   bifidobacterium infantis (ALIGN) capsule [id number] Yes Take 1 capsule by mouth daily. Avram Lupita BRAVO, MD  Active   cholecalciferol (VITAMIN D ) 1000 units tablet [id number] Yes Take 3,000 Units by mouth daily. [provider]  Active   colchicine  0.6 MG tablet [id number] Yes Take 0.6 mg by mouth daily as needed (gout). [provider]  Active   cyclobenzaprine  (FLEXERIL ) 10 MG tablet [id number] Yes Take 1 tablet (10 mg total) by mouth 3 (three) times daily as needed for muscle spasms. Antonio Meth, Yvonne R, DO  Active   EPINEPHrine  (EPIPEN  2-PAK) 0.3 mg/0.3 mL IJ SOAJ injection [id number]  As directed Lowne Chase, Yvonne R, DO  Active   esomeprazole  (NEXIUM ) 40 MG capsule [id number] Yes Take 1 capsule (40 mg total) by mouth daily before breakfast. Avram Lupita  E, MD  Active   estradiol  (ESTRACE ) 0.01 % CREA vaginal cream [id number] Yes Place 0.5 g vaginally 2 (two) times a week. Place 0.5g (pea sized amount) vaginally at night twice a week Kayla Rosaline SAILOR, MD  Active   famotidine  (PEPCID ) 20 MG tablet [id number] Yes Take 1 tablet (20 mg total) by mouth 2 (two) times daily. Beather Delon Gibson, PA  Active   Flaxseed, Linseed, (GROUND FLAX SEEDS PO) [id number] Yes  [provider]  Active   hyoscyamine  (LEVSIN  SL) 0.125 MG SL tablet [id number] Yes DISSOLVE 1 TABLET(0.125 MG) UNDER THE TONGUE  EVERY 6 HOURS AS NEEDED Avram Lupita BRAVO, MD  Active   latanoprost (XALATAN) 0.005 % ophthalmic solution [id number] Yes Place 1 drop into both eyes at bedtime. [provider]  Active   loratadine (CLARITIN REDITABS) 10 MG dissolvable tablet [id number] Yes Take 10 mg by mouth daily as needed for allergies. [provider]  Active   metoprolol  tartrate (LOPRESSOR ) 25 MG tablet [id number] Yes 1 po bid Antonio Meth, Yvonne R, DO  Active   montelukast  (SINGULAIR ) 10 MG tablet [id number] Yes TAKE 1 TABLET(10 MG) BY MOUTH AT BEDTIME Lowne Chase, Yvonne R, DO  Active   nystatin  cream (MYCOSTATIN ) [id number]  APPLY TOPICALLY TO THE AFFECTED AREA TWICE DAILY Antonio Meth, Yvonne R, DO  Active   Polyethyl Glycol-Propyl Glycol (SYSTANE) 0.4-0.3 % GEL ophthalmic gel [id number] Yes Place 1 application into both eyes. [provider]  Active   QVAR  REDIHALER 40 MCG/ACT inhaler [id number] Yes Inhale 2 puffs into the lungs 2 (two) times daily. Antonio Meth, Yvonne R, DO  Active   sodium chloride  (OCEAN) 0.65 % SOLN nasal spray [id number] Yes Place 1 spray into both nostrils as needed for congestion. [provider]  Active   tamsulosin  (FLOMAX ) 0.4 MG CAPS capsule [id number] Yes Take 1 capsule (0.4 mg total) by mouth daily. Lowne Chase, Yvonne R, DO  Active   Triamcinolone  Acetonide (NASACORT  AQ NA) [id number]  Place 2 sprays into the nose 2 (two) times daily as needed (allergies). [provider]  Active   triamcinolone  cream (KENALOG ) 0.1 % [id number]  Apply 1 Application topically 2 (two) times daily as needed (rash). [provider]  Active               Assessment/Plan:   Medication Management / Access:  - Checked her [facility] 2026 formualry and only covered ICS inhaler is Arnuity which is fluticasone  - same ingredient as Flovent  inhaler which patient had adverse reaction to.  - Alvesco, Asmanex and Pulmicort  / budesonide  are not covered.  - Checked and  there is a patient assistance program for QVAR . Based on her household income she should qualify. Will forward question to start patient assistance program application to Medication Assistance Team   Follow Up Plan: 2 to 3 weeks  Madelin Ray, PharmD Clinical Pharmacist [facility]  Population Health [phone]    "

## 2024-09-30 ENCOUNTER — Other Ambulatory Visit (HOSPITAL_COMMUNITY): Payer: Self-pay

## 2024-09-30 ENCOUNTER — Telehealth: Payer: Self-pay | Admitting: Pharmacist

## 2024-09-30 NOTE — Telephone Encounter (Signed)
-----   Message from Inocente JINNY Butcher sent at 09/30/2024  3:11 PM EST ----- I am unaware of any PAP for QVAR .  I checked needy meds and there is no program.  Is there something new that you know of? ----- Message ----- From: Carla Milling, RPH-CPP Sent: 09/29/2024   2:54 PM EST To: Rx Med Assistance Team  Please start patient assistance program for QVAR  inhaler - not covered on patient's [facility] plan

## 2024-09-30 NOTE — Telephone Encounter (Signed)
 I was mistaken - [employer] is no longer offering QVAR  thru their patient assistance program.  Pharmacy technician ran a test clam and indicated that QVAR  is covered with [facility] however the copay is $214.29, she has a deductible to meet  - however I think the cost that is coming back in the test claim is the full cost of the medication. I checked yesterday and [employer] was showing the only covered inhaler corticosteroid as Arnuity which patient cannot take.  Will see if technician can call [employer] to verify if there are other alternatives to Arnuity. If not I would like  to try to request a formulary excpetion since patient cannot take Arnuity.  Notified patient. She states she will have  difficulty affording QVAR  that is $214.

## 2024-10-01 ENCOUNTER — Other Ambulatory Visit (HOSPITAL_COMMUNITY): Payer: Self-pay

## 2024-10-02 NOTE — Progress Notes (Addendum)
 10/02/2024 - Addendum [employer] has actually removed QVAR  from their patient assistance program.  Since my search showed that QVAR  inhaler was non formulary and patient received a letter that it was non formulary and she had received only a 30 day transition supply, I called [employer] and they endorsed that this was true.  Completed a prior authorization since she is not able to use the formulary agent-  Arnuity / fluticasone  fur to past adverse reaction to an inhaler that contained fluticasone .  Prior authorization was approved but will still need to meet deductible of $250, then cost will be $0. LM on VM to notify patient.  Madelin Ray, PharmD Clinical Pharmacist [facility]

## 2024-10-19 ENCOUNTER — Ambulatory Visit: Payer: Self-pay | Admitting: Physical Therapy

## 2024-10-22 ENCOUNTER — Other Ambulatory Visit: Admitting: Pharmacist

## 2024-10-27 ENCOUNTER — Ambulatory Visit: Payer: Self-pay | Admitting: Physical Therapy

## 2024-12-21 ENCOUNTER — Ambulatory Visit: Admitting: Obstetrics and Gynecology

## 2025-03-05 ENCOUNTER — Ambulatory Visit
